# Patient Record
Sex: Male | Born: 1950 | Race: Black or African American | Hispanic: No | Marital: Single | Smoking: Current every day smoker
Health system: Southern US, Community
[De-identification: ages and names within clinical notes are randomized; demographics above are authoritative.]

## PROBLEM LIST (undated history)

## (undated) DIAGNOSIS — Z87442 Personal history of urinary calculi: Secondary | ICD-10-CM

## (undated) DIAGNOSIS — I10 Essential (primary) hypertension: Secondary | ICD-10-CM

## (undated) DIAGNOSIS — Z720 Tobacco use: Secondary | ICD-10-CM

## (undated) DIAGNOSIS — N189 Chronic kidney disease, unspecified: Secondary | ICD-10-CM

## (undated) DIAGNOSIS — I219 Acute myocardial infarction, unspecified: Secondary | ICD-10-CM

## (undated) DIAGNOSIS — R443 Hallucinations, unspecified: Secondary | ICD-10-CM

## (undated) DIAGNOSIS — D649 Anemia, unspecified: Secondary | ICD-10-CM

## (undated) DIAGNOSIS — K219 Gastro-esophageal reflux disease without esophagitis: Secondary | ICD-10-CM

## (undated) DIAGNOSIS — F32A Depression, unspecified: Secondary | ICD-10-CM

## (undated) DIAGNOSIS — F1010 Alcohol abuse, uncomplicated: Secondary | ICD-10-CM

## (undated) DIAGNOSIS — J309 Allergic rhinitis, unspecified: Secondary | ICD-10-CM

## (undated) DIAGNOSIS — E785 Hyperlipidemia, unspecified: Secondary | ICD-10-CM

## (undated) DIAGNOSIS — I4891 Unspecified atrial fibrillation: Secondary | ICD-10-CM

## (undated) DIAGNOSIS — B009 Herpesviral infection, unspecified: Secondary | ICD-10-CM

## (undated) DIAGNOSIS — F1410 Cocaine abuse, uncomplicated: Secondary | ICD-10-CM

## (undated) DIAGNOSIS — A6000 Herpesviral infection of urogenital system, unspecified: Secondary | ICD-10-CM

## (undated) DIAGNOSIS — M1990 Unspecified osteoarthritis, unspecified site: Secondary | ICD-10-CM

## (undated) DIAGNOSIS — I2510 Atherosclerotic heart disease of native coronary artery without angina pectoris: Secondary | ICD-10-CM

## (undated) HISTORY — DX: Cocaine abuse, uncomplicated: F14.10

## (undated) HISTORY — DX: Alcohol abuse, uncomplicated: F10.10

## (undated) HISTORY — DX: Hyperlipidemia, unspecified: E78.5

## (undated) HISTORY — DX: Herpesviral infection, unspecified: B00.9

## (undated) HISTORY — DX: Tobacco use: Z72.0

## (undated) HISTORY — DX: Unspecified atrial fibrillation: I48.91

## (undated) HISTORY — PX: CORONARY ARTERY BYPASS GRAFT: SHX141

## (undated) HISTORY — DX: Essential (primary) hypertension: I10

## (undated) HISTORY — DX: Anemia, unspecified: D64.9

## (undated) HISTORY — DX: Atherosclerotic heart disease of native coronary artery without angina pectoris: I25.10

## (undated) HISTORY — DX: Herpesviral infection of urogenital system, unspecified: A60.00

## (undated) HISTORY — DX: Allergic rhinitis, unspecified: J30.9

## (undated) HISTORY — DX: Gastro-esophageal reflux disease without esophagitis: K21.9

---

## 1988-03-11 HISTORY — PX: LACERATION REPAIR: SHX5168

## 1998-05-23 ENCOUNTER — Emergency Department (HOSPITAL_COMMUNITY): Admission: EM | Admit: 1998-05-23 | Discharge: 1998-05-23 | Payer: Self-pay | Admitting: Emergency Medicine

## 1998-09-14 ENCOUNTER — Emergency Department (HOSPITAL_COMMUNITY): Admission: EM | Admit: 1998-09-14 | Discharge: 1998-09-14 | Payer: Self-pay | Admitting: Emergency Medicine

## 1998-11-22 ENCOUNTER — Encounter: Payer: Self-pay | Admitting: Emergency Medicine

## 1998-11-22 ENCOUNTER — Emergency Department (HOSPITAL_COMMUNITY): Admission: EM | Admit: 1998-11-22 | Discharge: 1998-11-22 | Payer: Self-pay | Admitting: Emergency Medicine

## 1998-11-26 ENCOUNTER — Emergency Department (HOSPITAL_COMMUNITY): Admission: EM | Admit: 1998-11-26 | Discharge: 1998-11-26 | Payer: Self-pay | Admitting: Emergency Medicine

## 1998-11-26 ENCOUNTER — Encounter: Payer: Self-pay | Admitting: Emergency Medicine

## 1999-05-18 ENCOUNTER — Encounter: Admission: RE | Admit: 1999-05-18 | Discharge: 1999-05-18 | Payer: Self-pay | Admitting: Family Medicine

## 1999-06-14 ENCOUNTER — Encounter: Admission: RE | Admit: 1999-06-14 | Discharge: 1999-06-14 | Payer: Self-pay | Admitting: Family Medicine

## 1999-08-23 ENCOUNTER — Emergency Department (HOSPITAL_COMMUNITY): Admission: EM | Admit: 1999-08-23 | Discharge: 1999-08-23 | Payer: Self-pay | Admitting: Emergency Medicine

## 1999-08-23 ENCOUNTER — Encounter: Admission: RE | Admit: 1999-08-23 | Discharge: 1999-08-23 | Payer: Self-pay | Admitting: Sports Medicine

## 1999-09-22 ENCOUNTER — Encounter: Admission: RE | Admit: 1999-09-22 | Discharge: 1999-09-22 | Payer: Self-pay | Admitting: Family Medicine

## 1999-09-22 ENCOUNTER — Ambulatory Visit (HOSPITAL_COMMUNITY): Admission: RE | Admit: 1999-09-22 | Discharge: 1999-09-22 | Payer: Self-pay | Admitting: Family Medicine

## 1999-10-20 ENCOUNTER — Encounter: Admission: RE | Admit: 1999-10-20 | Discharge: 1999-10-20 | Payer: Self-pay | Admitting: Family Medicine

## 2000-01-24 ENCOUNTER — Encounter: Admission: RE | Admit: 2000-01-24 | Discharge: 2000-01-24 | Payer: Self-pay | Admitting: Family Medicine

## 2000-02-14 ENCOUNTER — Encounter: Admission: RE | Admit: 2000-02-14 | Discharge: 2000-02-14 | Payer: Self-pay | Admitting: Sports Medicine

## 2000-03-06 ENCOUNTER — Encounter: Admission: RE | Admit: 2000-03-06 | Discharge: 2000-03-06 | Payer: Self-pay | Admitting: Family Medicine

## 2000-03-21 ENCOUNTER — Encounter: Admission: RE | Admit: 2000-03-21 | Discharge: 2000-03-21 | Payer: Self-pay | Admitting: Family Medicine

## 2000-04-11 ENCOUNTER — Encounter: Admission: RE | Admit: 2000-04-11 | Discharge: 2000-04-11 | Payer: Self-pay | Admitting: Family Medicine

## 2000-05-17 ENCOUNTER — Encounter: Admission: RE | Admit: 2000-05-17 | Discharge: 2000-05-17 | Payer: Self-pay | Admitting: Family Medicine

## 2000-06-04 ENCOUNTER — Encounter: Admission: RE | Admit: 2000-06-04 | Discharge: 2000-06-04 | Payer: Self-pay | Admitting: Family Medicine

## 2000-06-12 ENCOUNTER — Encounter: Admission: RE | Admit: 2000-06-12 | Discharge: 2000-06-12 | Payer: Self-pay | Admitting: Family Medicine

## 2000-07-09 ENCOUNTER — Encounter: Admission: RE | Admit: 2000-07-09 | Discharge: 2000-07-09 | Payer: Self-pay | Admitting: Family Medicine

## 2000-07-20 ENCOUNTER — Encounter: Admission: RE | Admit: 2000-07-20 | Discharge: 2000-07-20 | Payer: Self-pay | Admitting: Family Medicine

## 2000-08-10 ENCOUNTER — Encounter: Admission: RE | Admit: 2000-08-10 | Discharge: 2000-08-10 | Payer: Self-pay | Admitting: Family Medicine

## 2000-08-13 ENCOUNTER — Encounter: Admission: RE | Admit: 2000-08-13 | Discharge: 2000-08-13 | Payer: Self-pay | Admitting: Family Medicine

## 2000-08-21 ENCOUNTER — Encounter: Admission: RE | Admit: 2000-08-21 | Discharge: 2000-08-21 | Payer: Self-pay | Admitting: Family Medicine

## 2000-10-12 ENCOUNTER — Encounter: Admission: RE | Admit: 2000-10-12 | Discharge: 2000-10-12 | Payer: Self-pay | Admitting: Family Medicine

## 2000-11-26 ENCOUNTER — Encounter: Admission: RE | Admit: 2000-11-26 | Discharge: 2000-11-26 | Payer: Self-pay | Admitting: Family Medicine

## 2001-02-27 ENCOUNTER — Encounter: Admission: RE | Admit: 2001-02-27 | Discharge: 2001-02-27 | Payer: Self-pay | Admitting: Family Medicine

## 2001-03-06 ENCOUNTER — Encounter: Admission: RE | Admit: 2001-03-06 | Discharge: 2001-03-06 | Payer: Self-pay | Admitting: Family Medicine

## 2001-04-15 ENCOUNTER — Encounter: Admission: RE | Admit: 2001-04-15 | Discharge: 2001-04-15 | Payer: Self-pay | Admitting: Sports Medicine

## 2001-08-21 ENCOUNTER — Encounter: Admission: RE | Admit: 2001-08-21 | Discharge: 2001-08-21 | Payer: Self-pay | Admitting: Family Medicine

## 2001-08-29 ENCOUNTER — Encounter: Admission: RE | Admit: 2001-08-29 | Discharge: 2001-08-29 | Payer: Self-pay | Admitting: Family Medicine

## 2001-09-18 ENCOUNTER — Encounter: Admission: RE | Admit: 2001-09-18 | Discharge: 2001-09-18 | Payer: Self-pay | Admitting: Family Medicine

## 2001-10-17 ENCOUNTER — Encounter: Admission: RE | Admit: 2001-10-17 | Discharge: 2001-10-17 | Payer: Self-pay | Admitting: Family Medicine

## 2001-12-16 ENCOUNTER — Encounter: Admission: RE | Admit: 2001-12-16 | Discharge: 2001-12-16 | Payer: Self-pay | Admitting: Family Medicine

## 2002-01-06 ENCOUNTER — Encounter: Admission: RE | Admit: 2002-01-06 | Discharge: 2002-01-06 | Payer: Self-pay | Admitting: Family Medicine

## 2002-01-16 ENCOUNTER — Encounter: Admission: RE | Admit: 2002-01-16 | Discharge: 2002-01-16 | Payer: Self-pay | Admitting: Sports Medicine

## 2002-03-20 ENCOUNTER — Encounter: Admission: RE | Admit: 2002-03-20 | Discharge: 2002-03-20 | Payer: Self-pay | Admitting: Family Medicine

## 2002-04-15 ENCOUNTER — Encounter: Admission: RE | Admit: 2002-04-15 | Discharge: 2002-04-15 | Payer: Self-pay | Admitting: Family Medicine

## 2002-04-22 ENCOUNTER — Encounter: Admission: RE | Admit: 2002-04-22 | Discharge: 2002-04-22 | Payer: Self-pay | Admitting: Family Medicine

## 2003-02-11 ENCOUNTER — Encounter: Admission: RE | Admit: 2003-02-11 | Discharge: 2003-02-11 | Payer: Self-pay | Admitting: Family Medicine

## 2003-09-12 DIAGNOSIS — I219 Acute myocardial infarction, unspecified: Secondary | ICD-10-CM

## 2003-09-12 HISTORY — DX: Acute myocardial infarction, unspecified: I21.9

## 2003-09-22 ENCOUNTER — Encounter: Admission: RE | Admit: 2003-09-22 | Discharge: 2003-09-22 | Payer: Self-pay | Admitting: Sports Medicine

## 2003-09-25 ENCOUNTER — Encounter: Admission: RE | Admit: 2003-09-25 | Discharge: 2003-09-25 | Payer: Self-pay | Admitting: Family Medicine

## 2003-10-20 ENCOUNTER — Encounter: Admission: RE | Admit: 2003-10-20 | Discharge: 2003-10-20 | Payer: Self-pay | Admitting: Family Medicine

## 2003-11-12 ENCOUNTER — Encounter: Admission: RE | Admit: 2003-11-12 | Discharge: 2003-11-12 | Payer: Self-pay | Admitting: Family Medicine

## 2003-11-19 ENCOUNTER — Encounter: Admission: RE | Admit: 2003-11-19 | Discharge: 2003-11-19 | Payer: Self-pay | Admitting: Family Medicine

## 2003-11-26 ENCOUNTER — Encounter: Admission: RE | Admit: 2003-11-26 | Discharge: 2003-11-26 | Payer: Self-pay | Admitting: Sports Medicine

## 2003-12-10 ENCOUNTER — Encounter: Admission: RE | Admit: 2003-12-10 | Discharge: 2003-12-10 | Payer: Self-pay | Admitting: Family Medicine

## 2004-01-18 ENCOUNTER — Encounter: Admission: RE | Admit: 2004-01-18 | Discharge: 2004-01-18 | Payer: Self-pay | Admitting: Family Medicine

## 2004-04-29 ENCOUNTER — Encounter: Admission: RE | Admit: 2004-04-29 | Discharge: 2004-04-29 | Payer: Self-pay | Admitting: Family Medicine

## 2004-05-23 ENCOUNTER — Ambulatory Visit: Payer: Self-pay | Admitting: Family Medicine

## 2004-07-19 ENCOUNTER — Ambulatory Visit: Payer: Self-pay | Admitting: Family Medicine

## 2004-07-20 ENCOUNTER — Encounter: Admission: RE | Admit: 2004-07-20 | Discharge: 2004-07-20 | Payer: Self-pay | Admitting: Sports Medicine

## 2004-08-11 HISTORY — PX: CARDIAC CATHETERIZATION: SHX172

## 2004-08-18 ENCOUNTER — Ambulatory Visit: Payer: Self-pay | Admitting: Family Medicine

## 2004-08-23 ENCOUNTER — Encounter: Admission: RE | Admit: 2004-08-23 | Discharge: 2004-09-01 | Payer: Self-pay | Admitting: Family Medicine

## 2004-08-29 ENCOUNTER — Inpatient Hospital Stay (HOSPITAL_COMMUNITY): Admission: EM | Admit: 2004-08-29 | Discharge: 2004-09-04 | Payer: Self-pay | Admitting: Emergency Medicine

## 2004-09-07 ENCOUNTER — Ambulatory Visit: Payer: Self-pay | Admitting: Sports Medicine

## 2004-11-14 ENCOUNTER — Ambulatory Visit: Payer: Self-pay | Admitting: Family Medicine

## 2004-11-14 ENCOUNTER — Ambulatory Visit (HOSPITAL_COMMUNITY): Admission: RE | Admit: 2004-11-14 | Discharge: 2004-11-14 | Payer: Self-pay | Admitting: Family Medicine

## 2004-11-17 ENCOUNTER — Ambulatory Visit: Payer: Self-pay | Admitting: Sports Medicine

## 2004-11-22 ENCOUNTER — Ambulatory Visit: Payer: Self-pay | Admitting: Family Medicine

## 2004-11-25 ENCOUNTER — Ambulatory Visit: Payer: Self-pay | Admitting: Family Medicine

## 2004-11-28 ENCOUNTER — Ambulatory Visit: Payer: Self-pay | Admitting: Family Medicine

## 2004-12-05 ENCOUNTER — Ambulatory Visit: Payer: Self-pay | Admitting: Family Medicine

## 2004-12-09 ENCOUNTER — Ambulatory Visit: Payer: Self-pay | Admitting: Sports Medicine

## 2004-12-16 ENCOUNTER — Ambulatory Visit: Payer: Self-pay | Admitting: Family Medicine

## 2004-12-27 ENCOUNTER — Ambulatory Visit: Payer: Self-pay | Admitting: Sports Medicine

## 2005-01-03 ENCOUNTER — Ambulatory Visit: Payer: Self-pay | Admitting: Family Medicine

## 2005-01-11 ENCOUNTER — Ambulatory Visit: Payer: Self-pay | Admitting: Family Medicine

## 2005-01-25 ENCOUNTER — Ambulatory Visit: Payer: Self-pay | Admitting: Family Medicine

## 2005-01-26 ENCOUNTER — Ambulatory Visit: Payer: Self-pay | Admitting: Family Medicine

## 2005-02-01 ENCOUNTER — Encounter: Admission: RE | Admit: 2005-02-01 | Discharge: 2005-02-01 | Payer: Self-pay | Admitting: Sports Medicine

## 2005-02-01 ENCOUNTER — Ambulatory Visit: Payer: Self-pay | Admitting: Family Medicine

## 2005-02-08 ENCOUNTER — Ambulatory Visit: Payer: Self-pay | Admitting: Family Medicine

## 2005-02-27 ENCOUNTER — Ambulatory Visit: Payer: Self-pay | Admitting: Family Medicine

## 2005-03-27 ENCOUNTER — Ambulatory Visit: Payer: Self-pay | Admitting: Family Medicine

## 2005-04-17 ENCOUNTER — Ambulatory Visit: Payer: Self-pay | Admitting: Family Medicine

## 2005-07-28 ENCOUNTER — Ambulatory Visit: Payer: Self-pay | Admitting: Family Medicine

## 2005-08-31 ENCOUNTER — Ambulatory Visit: Payer: Self-pay | Admitting: Family Medicine

## 2005-09-26 ENCOUNTER — Ambulatory Visit: Payer: Self-pay | Admitting: Family Medicine

## 2005-10-30 ENCOUNTER — Ambulatory Visit: Payer: Self-pay | Admitting: Family Medicine

## 2005-12-07 ENCOUNTER — Ambulatory Visit: Payer: Self-pay | Admitting: Family Medicine

## 2005-12-10 LAB — HM COLONOSCOPY: HM Colonoscopy: NORMAL

## 2005-12-28 ENCOUNTER — Encounter (INDEPENDENT_AMBULATORY_CARE_PROVIDER_SITE_OTHER): Payer: Self-pay | Admitting: Specialist

## 2005-12-28 ENCOUNTER — Ambulatory Visit: Payer: Self-pay | Admitting: Family Medicine

## 2005-12-28 ENCOUNTER — Ambulatory Visit (HOSPITAL_COMMUNITY): Admission: RE | Admit: 2005-12-28 | Discharge: 2005-12-28 | Payer: Self-pay | Admitting: Gastroenterology

## 2006-01-29 ENCOUNTER — Ambulatory Visit: Payer: Self-pay | Admitting: Family Medicine

## 2006-02-19 ENCOUNTER — Ambulatory Visit: Payer: Self-pay | Admitting: Family Medicine

## 2006-02-28 ENCOUNTER — Ambulatory Visit: Payer: Self-pay | Admitting: Family Medicine

## 2006-03-06 ENCOUNTER — Ambulatory Visit: Payer: Self-pay | Admitting: Family Medicine

## 2006-04-03 ENCOUNTER — Ambulatory Visit: Payer: Self-pay | Admitting: Sports Medicine

## 2006-05-22 ENCOUNTER — Ambulatory Visit: Payer: Self-pay | Admitting: Sports Medicine

## 2006-07-25 ENCOUNTER — Ambulatory Visit: Payer: Self-pay | Admitting: Sports Medicine

## 2006-08-14 ENCOUNTER — Ambulatory Visit: Payer: Self-pay | Admitting: Family Medicine

## 2006-08-27 ENCOUNTER — Encounter: Payer: Self-pay | Admitting: *Deleted

## 2006-10-25 ENCOUNTER — Ambulatory Visit: Payer: Self-pay | Admitting: Sports Medicine

## 2006-11-06 ENCOUNTER — Ambulatory Visit: Payer: Self-pay | Admitting: Family Medicine

## 2006-11-08 DIAGNOSIS — F339 Major depressive disorder, recurrent, unspecified: Secondary | ICD-10-CM | POA: Insufficient documentation

## 2006-11-08 DIAGNOSIS — F17200 Nicotine dependence, unspecified, uncomplicated: Secondary | ICD-10-CM

## 2006-11-08 DIAGNOSIS — F209 Schizophrenia, unspecified: Secondary | ICD-10-CM | POA: Insufficient documentation

## 2006-11-08 DIAGNOSIS — I4891 Unspecified atrial fibrillation: Secondary | ICD-10-CM

## 2006-11-08 DIAGNOSIS — J309 Allergic rhinitis, unspecified: Secondary | ICD-10-CM

## 2006-11-08 DIAGNOSIS — I2510 Atherosclerotic heart disease of native coronary artery without angina pectoris: Secondary | ICD-10-CM | POA: Insufficient documentation

## 2006-11-08 DIAGNOSIS — N400 Enlarged prostate without lower urinary tract symptoms: Secondary | ICD-10-CM | POA: Insufficient documentation

## 2006-11-08 DIAGNOSIS — K219 Gastro-esophageal reflux disease without esophagitis: Secondary | ICD-10-CM | POA: Insufficient documentation

## 2006-11-08 DIAGNOSIS — E7800 Pure hypercholesterolemia, unspecified: Secondary | ICD-10-CM | POA: Insufficient documentation

## 2006-11-08 DIAGNOSIS — N181 Chronic kidney disease, stage 1: Secondary | ICD-10-CM | POA: Insufficient documentation

## 2006-11-08 HISTORY — DX: Allergic rhinitis, unspecified: J30.9

## 2006-11-13 ENCOUNTER — Ambulatory Visit: Payer: Self-pay | Admitting: Family Medicine

## 2006-11-13 ENCOUNTER — Encounter (INDEPENDENT_AMBULATORY_CARE_PROVIDER_SITE_OTHER): Payer: Self-pay | Admitting: Family Medicine

## 2006-11-13 LAB — CONVERTED CEMR LAB
BUN: 14 mg/dL (ref 6–23)
CO2: 22 meq/L (ref 19–32)
Calcium: 9.2 mg/dL (ref 8.4–10.5)
Chloride: 103 meq/L (ref 96–112)
Creatinine, Ser: 1.19 mg/dL (ref 0.40–1.50)
Glucose, Bld: 73 mg/dL (ref 70–99)
Potassium: 3.9 meq/L (ref 3.5–5.3)
Sodium: 140 meq/L (ref 135–145)

## 2006-12-11 ENCOUNTER — Ambulatory Visit: Payer: Self-pay | Admitting: Sports Medicine

## 2006-12-11 DIAGNOSIS — I10 Essential (primary) hypertension: Secondary | ICD-10-CM

## 2007-02-19 ENCOUNTER — Encounter (INDEPENDENT_AMBULATORY_CARE_PROVIDER_SITE_OTHER): Payer: Self-pay | Admitting: Family Medicine

## 2007-02-19 ENCOUNTER — Ambulatory Visit: Payer: Self-pay | Admitting: Family Medicine

## 2007-02-19 LAB — CONVERTED CEMR LAB
ALT: 12 units/L (ref 0–53)
AST: 17 units/L (ref 0–37)
Albumin: 4.3 g/dL (ref 3.5–5.2)
Alkaline Phosphatase: 77 units/L (ref 39–117)
BUN: 13 mg/dL (ref 6–23)
Bilirubin Urine: NEGATIVE
CO2: 24 meq/L (ref 19–32)
Calcium: 9.4 mg/dL (ref 8.4–10.5)
Chloride: 106 meq/L (ref 96–112)
Creatinine, Ser: 1.12 mg/dL (ref 0.40–1.50)
Glucose, Bld: 67 mg/dL — ABNORMAL LOW (ref 70–99)
Glucose, Urine, Semiquant: NEGATIVE
HCT: 35.9 % — ABNORMAL LOW (ref 39.0–52.0)
Hemoglobin: 12 g/dL — ABNORMAL LOW (ref 13.0–17.0)
Ketones, urine, test strip: NEGATIVE
MCHC: 33.4 g/dL (ref 30.0–36.0)
MCV: 93.2 fL (ref 78.0–100.0)
Nitrite: NEGATIVE
PSA: 1.06 ng/mL (ref 0.10–4.00)
Platelets: 209 10*3/uL (ref 150–400)
Potassium: 4 meq/L (ref 3.5–5.3)
Protein, U semiquant: NEGATIVE
RBC: 3.85 M/uL — ABNORMAL LOW (ref 4.22–5.81)
RDW: 13.7 % (ref 11.5–14.0)
Sodium: 142 meq/L (ref 135–145)
Specific Gravity, Urine: 1.02
TSH: 2.385 microintl units/mL (ref 0.350–5.50)
Total Bilirubin: 1 mg/dL (ref 0.3–1.2)
Total Protein: 7.6 g/dL (ref 6.0–8.3)
Urobilinogen, UA: 0.2
WBC Urine, dipstick: NEGATIVE
WBC: 3.7 10*3/uL — ABNORMAL LOW (ref 4.0–10.5)
pH: 6

## 2007-02-25 ENCOUNTER — Ambulatory Visit (HOSPITAL_COMMUNITY): Admission: RE | Admit: 2007-02-25 | Discharge: 2007-02-25 | Payer: Self-pay | Admitting: Family Medicine

## 2007-04-23 ENCOUNTER — Encounter (INDEPENDENT_AMBULATORY_CARE_PROVIDER_SITE_OTHER): Payer: Self-pay | Admitting: Family Medicine

## 2007-05-22 ENCOUNTER — Ambulatory Visit: Payer: Self-pay | Admitting: Family Medicine

## 2007-05-22 LAB — CONVERTED CEMR LAB
Cholesterol, target level: 200 mg/dL
HDL goal, serum: 40 mg/dL
LDL Goal: 70 mg/dL

## 2007-05-28 ENCOUNTER — Encounter (INDEPENDENT_AMBULATORY_CARE_PROVIDER_SITE_OTHER): Payer: Self-pay | Admitting: Family Medicine

## 2007-06-05 ENCOUNTER — Ambulatory Visit: Payer: Self-pay | Admitting: Family Medicine

## 2007-06-05 ENCOUNTER — Encounter (INDEPENDENT_AMBULATORY_CARE_PROVIDER_SITE_OTHER): Payer: Self-pay | Admitting: Family Medicine

## 2007-06-05 LAB — CONVERTED CEMR LAB
Cholesterol: 134 mg/dL (ref 0–200)
HDL: 47 mg/dL (ref >39)
LDL Cholesterol: 70 mg/dL (ref 0–99)
Total CHOL/HDL Ratio: 2.9
Triglycerides: 84 mg/dL (ref <150)
VLDL: 17 mg/dL (ref 0–40)

## 2007-06-08 ENCOUNTER — Encounter (INDEPENDENT_AMBULATORY_CARE_PROVIDER_SITE_OTHER): Payer: Self-pay | Admitting: Family Medicine

## 2007-06-11 ENCOUNTER — Encounter (INDEPENDENT_AMBULATORY_CARE_PROVIDER_SITE_OTHER): Payer: Self-pay | Admitting: Family Medicine

## 2007-07-30 ENCOUNTER — Telehealth: Payer: Self-pay | Admitting: *Deleted

## 2007-08-13 ENCOUNTER — Ambulatory Visit: Payer: Self-pay | Admitting: Family Medicine

## 2007-08-15 ENCOUNTER — Encounter (INDEPENDENT_AMBULATORY_CARE_PROVIDER_SITE_OTHER): Payer: Self-pay | Admitting: Family Medicine

## 2007-10-15 ENCOUNTER — Telehealth: Payer: Self-pay | Admitting: *Deleted

## 2007-11-13 ENCOUNTER — Ambulatory Visit: Payer: Self-pay | Admitting: Family Medicine

## 2008-03-05 ENCOUNTER — Ambulatory Visit: Payer: Self-pay | Admitting: Family Medicine

## 2008-03-06 ENCOUNTER — Ambulatory Visit: Payer: Self-pay | Admitting: Family Medicine

## 2008-03-06 ENCOUNTER — Encounter: Payer: Self-pay | Admitting: Family Medicine

## 2008-03-06 LAB — CONVERTED CEMR LAB
Cholesterol: 171 mg/dL (ref 0–200)
HDL: 49 mg/dL (ref >39)
LDL Cholesterol: 102 mg/dL — ABNORMAL HIGH (ref 0–99)
Total CHOL/HDL Ratio: 3.5
Triglycerides: 98 mg/dL (ref <150)
VLDL: 20 mg/dL (ref 0–40)

## 2008-04-09 ENCOUNTER — Telehealth: Payer: Self-pay | Admitting: Family Medicine

## 2008-04-27 ENCOUNTER — Ambulatory Visit: Payer: Self-pay | Admitting: Family Medicine

## 2008-06-04 ENCOUNTER — Ambulatory Visit: Payer: Self-pay | Admitting: Family Medicine

## 2008-06-04 ENCOUNTER — Encounter: Payer: Self-pay | Admitting: Family Medicine

## 2008-06-16 ENCOUNTER — Telehealth (INDEPENDENT_AMBULATORY_CARE_PROVIDER_SITE_OTHER): Payer: Self-pay | Admitting: *Deleted

## 2008-07-17 ENCOUNTER — Telehealth: Payer: Self-pay | Admitting: *Deleted

## 2008-08-19 ENCOUNTER — Ambulatory Visit: Payer: Self-pay | Admitting: Family Medicine

## 2008-08-19 ENCOUNTER — Encounter: Payer: Self-pay | Admitting: Family Medicine

## 2008-08-19 LAB — CONVERTED CEMR LAB
ALT: 23 units/L (ref 0–53)
AST: 25 units/L (ref 0–37)
Albumin: 4.5 g/dL (ref 3.5–5.2)
Alkaline Phosphatase: 79 units/L (ref 39–117)
BUN: 14 mg/dL (ref 6–23)
CO2: 22 meq/L (ref 19–32)
Calcium: 9 mg/dL (ref 8.4–10.5)
Chloride: 110 meq/L (ref 96–112)
Creatinine, Ser: 1.45 mg/dL (ref 0.40–1.50)
Glucose, Bld: 88 mg/dL (ref 70–99)
PSA: 1.05 ng/mL (ref 0.10–4.00)
Potassium: 3.7 meq/L (ref 3.5–5.3)
Sodium: 145 meq/L (ref 135–145)
Total Bilirubin: 0.7 mg/dL (ref 0.3–1.2)
Total Protein: 7.6 g/dL (ref 6.0–8.3)

## 2008-08-24 ENCOUNTER — Encounter: Payer: Self-pay | Admitting: Family Medicine

## 2008-10-19 ENCOUNTER — Encounter: Payer: Self-pay | Admitting: Family Medicine

## 2008-11-16 ENCOUNTER — Ambulatory Visit: Payer: Self-pay | Admitting: Family Medicine

## 2008-11-16 ENCOUNTER — Encounter: Payer: Self-pay | Admitting: Family Medicine

## 2008-11-16 LAB — CONVERTED CEMR LAB
BUN: 10 mg/dL (ref 6–23)
CO2: 18 meq/L — ABNORMAL LOW (ref 19–32)
Calcium: 9 mg/dL (ref 8.4–10.5)
Chloride: 104 meq/L (ref 96–112)
Creatinine, Ser: 1.13 mg/dL (ref 0.40–1.50)
Glucose, Bld: 84 mg/dL (ref 70–99)
Potassium: 3.7 meq/L (ref 3.5–5.3)
Sodium: 142 meq/L (ref 135–145)

## 2008-11-26 ENCOUNTER — Encounter: Payer: Self-pay | Admitting: Family Medicine

## 2008-12-08 ENCOUNTER — Encounter: Payer: Self-pay | Admitting: *Deleted

## 2008-12-08 ENCOUNTER — Encounter: Payer: Self-pay | Admitting: Family Medicine

## 2008-12-14 ENCOUNTER — Telehealth: Payer: Self-pay | Admitting: Family Medicine

## 2009-02-11 ENCOUNTER — Telehealth: Payer: Self-pay | Admitting: Family Medicine

## 2009-02-25 ENCOUNTER — Ambulatory Visit: Payer: Self-pay | Admitting: Family Medicine

## 2009-03-09 ENCOUNTER — Ambulatory Visit: Payer: Self-pay | Admitting: Family Medicine

## 2009-03-11 ENCOUNTER — Encounter: Payer: Self-pay | Admitting: Family Medicine

## 2009-03-16 ENCOUNTER — Telehealth: Payer: Self-pay | Admitting: *Deleted

## 2009-03-25 ENCOUNTER — Telehealth: Payer: Self-pay | Admitting: Family Medicine

## 2009-04-13 ENCOUNTER — Telehealth: Payer: Self-pay | Admitting: Family Medicine

## 2009-04-14 ENCOUNTER — Ambulatory Visit: Payer: Self-pay | Admitting: Family Medicine

## 2009-04-14 DIAGNOSIS — M109 Gout, unspecified: Secondary | ICD-10-CM | POA: Insufficient documentation

## 2009-06-02 ENCOUNTER — Ambulatory Visit: Payer: Self-pay | Admitting: Family Medicine

## 2009-06-02 ENCOUNTER — Encounter: Payer: Self-pay | Admitting: Family Medicine

## 2009-06-03 ENCOUNTER — Telehealth: Payer: Self-pay | Admitting: Family Medicine

## 2009-06-07 ENCOUNTER — Ambulatory Visit: Payer: Self-pay | Admitting: Family Medicine

## 2009-06-09 ENCOUNTER — Ambulatory Visit: Payer: Self-pay | Admitting: Family Medicine

## 2009-08-16 ENCOUNTER — Ambulatory Visit: Payer: Self-pay | Admitting: Family Medicine

## 2009-11-25 ENCOUNTER — Telehealth: Payer: Self-pay | Admitting: Family Medicine

## 2009-12-02 ENCOUNTER — Encounter: Payer: Self-pay | Admitting: Family Medicine

## 2009-12-02 ENCOUNTER — Ambulatory Visit: Payer: Self-pay | Admitting: Family Medicine

## 2009-12-02 LAB — CONVERTED CEMR LAB
ALT: 13 units/L (ref 0–53)
AST: 21 units/L (ref 0–37)
Albumin: 4.2 g/dL (ref 3.5–5.2)
Alkaline Phosphatase: 87 units/L (ref 39–117)
BUN: 10 mg/dL (ref 6–23)
CO2: 23 meq/L (ref 19–32)
Calcium: 8.7 mg/dL (ref 8.4–10.5)
Chloride: 106 meq/L (ref 96–112)
Cholesterol: 125 mg/dL (ref 0–200)
Creatinine, Ser: 1.06 mg/dL (ref 0.40–1.50)
Glucose, Bld: 88 mg/dL (ref 70–99)
HDL: 40 mg/dL (ref >39)
LDL Cholesterol: 66 mg/dL (ref 0–99)
Potassium: 3.3 meq/L — ABNORMAL LOW (ref 3.5–5.3)
Sodium: 141 meq/L (ref 135–145)
Total Bilirubin: 0.6 mg/dL (ref 0.3–1.2)
Total CHOL/HDL Ratio: 3.1
Total Protein: 7.1 g/dL (ref 6.0–8.3)
Triglycerides: 97 mg/dL (ref <150)
VLDL: 19 mg/dL (ref 0–40)

## 2009-12-14 ENCOUNTER — Telehealth: Payer: Self-pay | Admitting: *Deleted

## 2010-02-16 ENCOUNTER — Encounter: Payer: Self-pay | Admitting: Family Medicine

## 2010-03-03 ENCOUNTER — Telehealth: Payer: Self-pay | Admitting: *Deleted

## 2010-03-17 ENCOUNTER — Ambulatory Visit: Payer: Self-pay | Admitting: Family Medicine

## 2010-03-17 DIAGNOSIS — M25519 Pain in unspecified shoulder: Secondary | ICD-10-CM | POA: Insufficient documentation

## 2010-03-23 ENCOUNTER — Ambulatory Visit: Payer: Self-pay | Admitting: Family Medicine

## 2010-04-04 ENCOUNTER — Ambulatory Visit: Payer: Self-pay | Admitting: Family Medicine

## 2010-04-06 ENCOUNTER — Encounter: Admission: RE | Admit: 2010-04-06 | Discharge: 2010-05-24 | Payer: Self-pay | Admitting: Family Medicine

## 2010-04-14 ENCOUNTER — Ambulatory Visit: Payer: Self-pay | Admitting: Cardiology

## 2010-04-18 ENCOUNTER — Ambulatory Visit: Payer: Self-pay | Admitting: Family Medicine

## 2010-05-23 ENCOUNTER — Telehealth: Payer: Self-pay | Admitting: Family Medicine

## 2010-05-24 ENCOUNTER — Ambulatory Visit: Payer: Self-pay | Admitting: Cardiovascular Disease

## 2010-05-24 ENCOUNTER — Encounter: Payer: Self-pay | Admitting: Family Medicine

## 2010-05-25 ENCOUNTER — Encounter: Payer: Self-pay | Admitting: Family Medicine

## 2010-05-25 LAB — CONVERTED CEMR LAB
Folate: 10.8 ng/mL
Hemoglobin: 9.2 g/dL
Iron: 37 ug/dL
Saturation Ratios: 9 %
Vitamin B-12: 232 pg/mL
WBC: 3.6 10*3/uL

## 2010-05-31 ENCOUNTER — Ambulatory Visit: Payer: Self-pay | Admitting: Family Medicine

## 2010-05-31 ENCOUNTER — Encounter: Payer: Self-pay | Admitting: Family Medicine

## 2010-05-31 DIAGNOSIS — D509 Iron deficiency anemia, unspecified: Secondary | ICD-10-CM | POA: Insufficient documentation

## 2010-05-31 LAB — CONVERTED CEMR LAB
Basophils Absolute: 0 10*3/uL (ref 0.0–0.1)
Basophils Relative: 0 % (ref 0–1)
Eosinophils Absolute: 0.1 10*3/uL (ref 0.0–0.7)
Eosinophils Relative: 3 % (ref 0–5)
Ferritin: 24 ng/mL (ref 22–322)
Folate: 7 ng/mL
HCT: 30.4 % — ABNORMAL LOW (ref 39.0–52.0)
Hemoglobin: 8.9 g/dL — ABNORMAL LOW (ref 13.0–17.0)
Iron: 19 ug/dL — ABNORMAL LOW (ref 42–165)
Lymphocytes Relative: 46 % (ref 12–46)
Lymphs Abs: 1.6 10*3/uL (ref 0.7–4.0)
MCHC: 29.3 g/dL — ABNORMAL LOW (ref 30.0–36.0)
MCV: 75.4 fL — ABNORMAL LOW (ref 78.0–100.0)
Monocytes Absolute: 0.2 10*3/uL (ref 0.1–1.0)
Monocytes Relative: 7 % (ref 3–12)
Neutro Abs: 1.5 10*3/uL — ABNORMAL LOW (ref 1.7–7.7)
Neutrophils Relative %: 43 % (ref 43–77)
PSA: 0.98 ng/mL (ref 0.10–4.00)
Platelets: 243 10*3/uL (ref 150–400)
RBC: 4.03 M/uL — ABNORMAL LOW (ref 4.22–5.81)
RDW: 19.3 % — ABNORMAL HIGH (ref 11.5–15.5)
Saturation Ratios: 5 % — ABNORMAL LOW (ref 20–55)
TIBC: 410 ug/dL (ref 215–435)
UIBC: 391 ug/dL
Vitamin B-12: 334 pg/mL (ref 211–911)
WBC: 3.3 10*3/uL — ABNORMAL LOW (ref 4.0–10.5)

## 2010-06-03 ENCOUNTER — Encounter: Payer: Self-pay | Admitting: *Deleted

## 2010-06-06 ENCOUNTER — Encounter: Payer: Self-pay | Admitting: *Deleted

## 2010-06-06 ENCOUNTER — Telehealth: Payer: Self-pay | Admitting: *Deleted

## 2010-06-07 ENCOUNTER — Ambulatory Visit: Payer: Self-pay | Admitting: Family Medicine

## 2010-06-07 ENCOUNTER — Encounter: Payer: Self-pay | Admitting: Family Medicine

## 2010-06-07 ENCOUNTER — Encounter: Payer: Self-pay | Admitting: *Deleted

## 2010-06-07 LAB — CONVERTED CEMR LAB
Basophils Absolute: 0 10*3/uL (ref 0.0–0.1)
Basophils Relative: 0 % (ref 0–1)
Eosinophils Absolute: 0.1 10*3/uL (ref 0.0–0.7)
Eosinophils Relative: 2 % (ref 0–5)
HCT: 31.7 % — ABNORMAL LOW (ref 39.0–52.0)
Hemoglobin: 9.8 g/dL — ABNORMAL LOW (ref 13.0–17.0)
Lymphocytes Relative: 38 % (ref 12–46)
Lymphs Abs: 1.6 10*3/uL (ref 0.7–4.0)
MCHC: 30.9 g/dL (ref 30.0–36.0)
MCV: 73.7 fL — ABNORMAL LOW (ref 78.0–100.0)
Monocytes Absolute: 0.3 10*3/uL (ref 0.1–1.0)
Monocytes Relative: 8 % (ref 3–12)
Neutro Abs: 2.1 10*3/uL (ref 1.7–7.7)
Neutrophils Relative %: 51 % (ref 43–77)
Platelets: 220 10*3/uL (ref 150–400)
RBC: 4.3 M/uL (ref 4.22–5.81)
RDW: 19.7 % — ABNORMAL HIGH (ref 11.5–15.5)
WBC: 4.1 10*3/uL (ref 4.0–10.5)

## 2010-06-15 ENCOUNTER — Encounter: Payer: Self-pay | Admitting: Family Medicine

## 2010-06-15 LAB — CONVERTED CEMR LAB
OCCULT 1: NEGATIVE
OCCULT 2: NEGATIVE
OCCULT 3: NEGATIVE

## 2010-06-20 ENCOUNTER — Encounter: Payer: Self-pay | Admitting: Family Medicine

## 2010-06-29 ENCOUNTER — Encounter: Payer: Self-pay | Admitting: Family Medicine

## 2010-07-06 ENCOUNTER — Ambulatory Visit: Payer: Self-pay | Admitting: Family Medicine

## 2010-07-07 ENCOUNTER — Ambulatory Visit: Payer: Self-pay | Admitting: Cardiovascular Disease

## 2010-07-11 ENCOUNTER — Encounter: Payer: Self-pay | Admitting: *Deleted

## 2010-07-11 ENCOUNTER — Ambulatory Visit: Payer: Self-pay | Admitting: Family Medicine

## 2010-07-11 ENCOUNTER — Encounter: Payer: Self-pay | Admitting: Family Medicine

## 2010-07-11 LAB — CONVERTED CEMR LAB
HCT: 35.9 % — ABNORMAL LOW (ref 39.0–52.0)
Hemoglobin: 11.2 g/dL — ABNORMAL LOW (ref 13.0–17.0)
MCHC: 31.2 g/dL (ref 30.0–36.0)
MCV: 84.1 fL (ref 78.0–100.0)
Platelets: 202 10*3/uL (ref 150–400)
RBC: 4.27 M/uL (ref 4.22–5.81)
RDW: 25.9 % — ABNORMAL HIGH (ref 11.5–15.5)
WBC: 4.1 10*3/uL (ref 4.0–10.5)

## 2010-07-18 ENCOUNTER — Ambulatory Visit: Payer: Self-pay | Admitting: Family Medicine

## 2010-07-24 ENCOUNTER — Telehealth: Payer: Self-pay | Admitting: Family Medicine

## 2010-07-24 ENCOUNTER — Encounter: Payer: Self-pay | Admitting: Family Medicine

## 2010-07-25 ENCOUNTER — Ambulatory Visit: Payer: Self-pay | Admitting: Cardiovascular Disease

## 2010-07-28 ENCOUNTER — Ambulatory Visit: Payer: Self-pay | Admitting: Family Medicine

## 2010-08-22 ENCOUNTER — Ambulatory Visit: Payer: Self-pay | Admitting: Cardiology

## 2010-08-27 NOTE — Progress Notes (Signed)
Subjective:     Patient ID: Jesse Franklin is a 59 y.o. male.  HPI  Review of Systems     Objective:   Physical Exam    Erroneous encounter during abstraction 08/27/2010

## 2010-08-28 NOTE — Progress Notes (Signed)
This encounter was entered in error during abstraction.  The encounter was opened as an office visit instead of an abstraction. The encounter was corrected with an Erroneous Encounter SmartSet.

## 2010-08-29 ENCOUNTER — Encounter: Payer: Self-pay | Admitting: Family Medicine

## 2010-08-29 ENCOUNTER — Ambulatory Visit: Payer: Self-pay | Admitting: Family Medicine

## 2010-09-21 ENCOUNTER — Ambulatory Visit: Payer: Self-pay | Admitting: Cardiovascular Disease

## 2010-09-27 ENCOUNTER — Ambulatory Visit: Admission: RE | Admit: 2010-09-27 | Discharge: 2010-09-27 | Payer: Self-pay | Source: Home / Self Care

## 2010-10-13 NOTE — Miscellaneous (Signed)
  Clinical Lists Changes  Medications: Changed medication from METOPROLOL SUCCINATE 25 MG XR24H-TAB (METOPROLOL SUCCINATE) 1 tab by mouth daily to METOPROLOL SUCCINATE 25 MG XR24H-TAB (METOPROLOL SUCCINATE) 2 tab by mouth daily Orders: Added new Test order of [facility]- Est Level  3 (72257) - Signed

## 2010-10-13 NOTE — Assessment & Plan Note (Signed)
Summary: BP CHECK/KH  Nurse Visit   Vital Signs:  Patient profile:   60 year old male BP sitting:   168 / 95  (right arm) Cuff size:   regular  Vitals Entered By: Enid Skeens, CMA (July 11, 2010 9:53 AM) CC: BP RECHECK    Allergies: 1)  * Toradol 2)  * Toradol  Orders Added: 1)  No Charge Patient Arrived (NCPA0) [NCPA0]

## 2010-10-13 NOTE — Progress Notes (Signed)
Summary: meds prob  Phone Note Refill Request Call back at Home Phone [phone] Message from:  Patient  Refills Requested: Medication #1:  SIMVASTATIN 80 MG TABS Take 1 tablet by mouth once a day pt has been out for a week  Initial call taken by: Audie Clear,  May 23, 2010 8:35 AM  Follow-up for Phone Call        to pcp Follow-up by: Elige Radon RN,  May 23, 2010 8:49 AM  Additional Follow-up for Phone Call Additional follow up Details #1::       Additional Follow-up by: Elige Radon RN,  May 24, 2010 10:02 AM    Additional Follow-up for Phone Call Additional follow up Details #2::    Pt calling regarding rx.  Still waiting for refill to be faxed to pharmacy.  Have an appt on Tues the 20th with primary, but do not want to wait until then to get meds.  Have been out since 2 wks ago.   Follow-up by: Eusebio Friendly  Additional Follow-up for Phone Call Additional follow up Details #3:: Details for Additional Follow-up Action Taken: I explained the md has to change the med or reduce dose. she may do this before or at his appt. told him I will call him when I hear from md   according to the package insert for simvastatin, it can be continued at 66m if pt has not had any adverse reaction.  Pt is not taking any medications that are contraindicated.  Pt has been using since 2007. Will refill simvastatin as same dose.  Will call pt to notify that Rx in pharmacy. DDorthey SawyerMD  May 27, 2010 7:56 AM  Additional Follow-up by: SElige RadonRN,  May 24, 2010 10:03 AM  Prescriptions: SIMVASTATIN 80 MG TABS (SIMVASTATIN) Take 1 tablet by mouth once a day  #30 x 3   Entered and Authorized by:   DDorthey SawyerMD   Signed by:   DDorthey SawyerMD on 05/27/2010   Method used:   [phone]       [address]       [place]  [address]      Ph: [phone]  [phone]      Fax: [phone]  RxID:   [id number]

## 2010-10-13 NOTE — Assessment & Plan Note (Signed)
Summary: anemia/ bp/ smoking cessation   Vital Signs:  Patient profile:   60 year old male Height:      66.5 inches Weight:      165 pounds BMI:     26.33 Temp:     98.2 degrees F oral Pulse rate:   66 / minute BP sitting:   125 / 82  (left arm) Cuff size:   regular  Vitals Entered By: Schuyler Amor CMA (May 31, 2010 2:54 PM) CC: F/U Is Patient Diabetic? No Pain Assessment Patient in pain? no        Primary Care Provider:  Dorthey Sawyer MD  CC:  F/U.  History of Present Illness:  Anemia: At pt's cardiology visit, lab tech thought that pt's blood looked "anemic" she told the MD who ordered a CBC and found his hemoglobin to be 9.5.  Pt was referred to me for further workup.  No source of bleeding identified.  No blood in stool, No tar colored stools.  No vomiting or coughing up blood.  Occasional reflux symptoms 1-2 x week.  On nexium.  does take motrin occasionally for h/a or aches and pains but not daily.    No renal problems.  No hx of blood transfusion.  No hx of liver problems.  Past hx of ETOH abuse but pt states that he is doing much better and now only drinks approx 4 glasses of wine per week.    HTN: BP wnl today and at cardiology visit.  No med changes at last cardiology visit.  prevention health care: Pt agreed to PSA screening today.  Spent time talking with pt about smoking cessation.   Pt smokes marijuna once daily. And smokes 4-6 cigarettes daily.     Habits & Providers  Alcohol-Tobacco-Diet     Tobacco Status: current     Tobacco Counseling: to quit use of tobacco products     Cigarette Packs/Day: <0.25  Current Medications (verified): 1)  Altace 10 Mg Caps (Ramipril) .Marland Kitchen.. 1 Tablet Twice A Day  For Blood Pressure 2)  Bayer Childrens Aspirin 81 Mg Chew (Aspirin) .... Take 1 Tablet By Mouth Once A Day 3)  Coumadin 5 Mg Tabs (Warfarin Sodium) .... Take 1 Tablet By Mouth Once A Day 4)  Flonase 50 Mcg/act Susp (Fluticasone Propionate) .... Spray 2  Spray Into Both Nostrils Once A Day 5)  Furosemide 20 Mg Tabs (Furosemide) .... Take 1 Tablet By Mouth Every Morning 6)  Imdur 60 Mg Tb24 (Isosorbide Mononitrate) .... Take 1 Tablet By Mouth Once A Day 7)  Metoprolol Succinate 25 Mg Xr24h-Tab (Metoprolol Succinate) .Marland Kitchen.. 1 Tab By Mouth Daily 8)  Nitroglycerin 0.4 Mg Subl (Nitroglycerin) .... Place 1 Tablet Under Tongue As Directed 9)  Plavix 75 Mg Tabs (Clopidogrel Bisulfate) .... Take 1 Tablet By Mouth Once A Day 10)  Simvastatin 80 Mg Tabs (Simvastatin) .... Take 1 Tablet By Mouth Once A Day 11)  Colcrys 0.6 Mg Tabs (Colchicine) .Marland Kitchen.. 1 Tab Twice A Day For Gout 12)  Qvar 80 Mcg/act Aers (Beclomethasone Dipropionate) .... Inhale 1 Puff As Directed Twice A Day 13)  Nexium 40 Mg Cpdr (Esomeprazole Magnesium) .Marland Kitchen.. 1 Tab By Mouth Daily 14)  Voltaren 1 % Gel (Diclofenac Sodium) .... Apply To Affected Area 3 Times A Day For 5 Days 60 G Tube 15)  Ibuprofen 600 Mg Tabs (Ibuprofen) .Marland Kitchen.. 1 Tab Twice A Day Only As Needed For Pain  Allergies (verified): 1)  * Toradol 2)  * Toradol  Family History: brother died 4/06 of unknown causes, h/o EtOH (father, brother), ?cirrhosis, sister died of AMI   sister (vaginal cancer)    Social History: Lives alone.  Unemployed.  Rare social smoker.  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 drinks wine (2-4x per week).  No current recreational drug use, although h/o cocaine. Still marijuana 1 x day.    Unable to read.    Smoking 3-4 cigarettes per day.   Review of Systems       as per hpi  Physical Exam  General:  VSS Well-developed,well-nourished,in no acute distress; alert,appropriate and cooperative throughout examination Eyes:  No conjunctival pallor Lungs:  Normal respiratory effort, chest expands symmetrically. Lungs are clear to auscultation, no crackles or wheezes. Heart:  Normal rate and regular rhythm. S1 and S2 normal without gallop, murmur, click, rub or other extra sounds. Extremities:  no edema, no palm  pallor Psych:  Cognition and judgment appear intact. Alert and cooperative with normal attention span and concentration. No apparent delusions, illusions, hallucinations   Impression & Recommendations:  Problem # 1:  UNSPECIFIED DEFICIENCY ANEMIA (ICD-281.9) No source of bleeding identified.  Will obtain a CBC, B12, folate, iron, TIBC, ferritin.  Pt to return within 1 month to review the results.  Pt instructed to stop taking motrin and to take tylenol if needed.  Pt is up to date on colonscopy.  Flowsheet states that this was in 2007 and was WNL.  I will request full report of this screening.  Orders: CBC w/Diff-[facility] ([id number]) B12-[facility] [phone]) Folate-[facility] [phone]) Ferritin-[facility] [phone]) Iron Binding Cap (TIBC)-[facility] ([id number]) Iron -[facility] [phone]) [place]- Est  Level 4 (53664)  Problem # 2:  SPECIAL SCREENING MALIGNANT NEOPLASM OF PROSTATE (ICD-V76.44) Pt due for PSA screening.  PSA obtained will plan on doing rectal exam at next visit since this was not the main reason for pt visit today.    Orders: PSA-[facility] ([id number]) [place]- Est  Level 4 (63875)  Problem # 3:  TOBACCO DEPENDENCE (ICD-305.1)  Pt encourged to stop smoking.  Gave handout on smoking cessation classes.   Orders: [place]- Est  Level 4 (64332)  Problem # 4:  Preventive Health Care (ICD-V70.0) Pt to return in 1 month for f/up for anemia and smoking cessation counseling.  Pt encouraged to stop smoking marijuna.   Will  consider referral to nutrition for counseling on low sodium, heart healthy diet,  Pt did not ask if he is a canidate for cardiac rehab at last cardiology visit.  Can consider checking into this in the future.  Will need to review the rest of the [facility] form with pt at a future visit.   Complete Medication List: 1)  Altace 10 Mg Caps (Ramipril) .Marland Kitchen.. 1 tablet twice a day  for blood pressure 2)  Bayer Childrens Aspirin 81 Mg Chew (Aspirin) .... Take 1 tablet by mouth once a day 3)  Coumadin 5 Mg Tabs  (Warfarin sodium) .... Take 1 tablet by mouth once a day 4)  Flonase 50 Mcg/act Susp (Fluticasone propionate) .... Spray 2 spray into both nostrils once a day 5)  Furosemide 20 Mg Tabs (Furosemide) .... Take 1 tablet by mouth every morning 6)  Imdur 60 Mg Tb24 (Isosorbide mononitrate) .... Take 1 tablet by mouth once a day 7)  Metoprolol Succinate 25 Mg Xr24h-tab (Metoprolol succinate) .Marland Kitchen.. 1 tab by mouth daily 8)  Nitroglycerin 0.4 Mg Subl (Nitroglycerin) .... Place 1 tablet under tongue as directed 9)  Plavix 75 Mg Tabs (Clopidogrel bisulfate) .Marland KitchenMarland KitchenMarland Kitchen  Take 1 tablet by mouth once a day 10)  Simvastatin 80 Mg Tabs (Simvastatin) .... Take 1 tablet by mouth once a day 11)  Colcrys 0.6 Mg Tabs (Colchicine) .Marland Kitchen.. 1 tab twice a day for gout 12)  Qvar 80 Mcg/act Aers (Beclomethasone dipropionate) .... Inhale 1 puff as directed twice a day 13)  Nexium 40 Mg Cpdr (Esomeprazole magnesium) .Marland Kitchen.. 1 tab by mouth daily 14)  Voltaren 1 % Gel (Diclofenac sodium) .... Apply to affected area 3 times a day for 5 days 60 g tube 15)  Ibuprofen 600 Mg Tabs (Ibuprofen) .Marland Kitchen.. 1 tab twice a day only as needed for pain  Patient Instructions: 1)  Return in 1 month to review results to follow up on anemia and  smoking cessation.  2)  Will obtain blood work to look for cause of anemia today.  We will discuss results at next appt. 3)  Stop taking motrin.  Take tylenol as needed. 4)  Stop smoking and using marijuana. 5)  Consider going to the smoking cessation classes.   Prevention & Chronic Care Immunizations   Influenza vaccine: refused  (06/04/2008)   Influenza vaccine deferral: Not available  (04/04/2010)   Influenza vaccine due: 06/10/2010    Tetanus booster: 06/04/2008: given   Tetanus booster due: 06/04/2018    Pneumococcal vaccine: Not documented  Colorectal Screening   Hemoccult: Done.  (08/16/2005)   Hemoccult action/deferral: Not indicated  (04/04/2010)   Hemoccult due: 08/16/2006    Colonoscopy: normal   (12/10/2005)   Colonoscopy due: 12/11/2015  Other Screening   PSA: 1.05  (08/19/2008)   PSA ordered.   PSA due due: 06/10/2010   Smoking status: current  (05/31/2010)   Smoking cessation counseling: yes  (08/19/2008)  Lipids   Total Cholesterol: 125  (12/02/2009)   LDL: 66  (12/02/2009)   LDL Direct: Not documented   HDL: 40  (12/02/2009)   Triglycerides: 97  (12/02/2009)   Lipid panel due: 09/01/2009    SGOT (AST): 21  (12/02/2009)   SGPT (ALT): 13  (12/02/2009)   Alkaline phosphatase: 87  (12/02/2009)   Total bilirubin: 0.6  (12/02/2009)  Hypertension   Last Blood Pressure: 125 / 82  (05/31/2010)   Serum creatinine: 1.06  (12/02/2009)   Serum potassium 3.3  (12/02/2009)  Self-Management Support :   Personal Goals (by the next clinic visit) :      Personal blood pressure goal: 140/90  (06/02/2009)     Personal LDL goal: 100  (06/02/2009)    Hypertension self-management support: Not documented    Lipid self-management support: Not documented

## 2010-10-13 NOTE — Consult Note (Signed)
Summary: [facility]   Imported By: Raymond Gurney 05/31/2010 11:41:28  _____________________________________________________________________  External Attachment:    Type:   Image     Comment:   External Document

## 2010-10-13 NOTE — Assessment & Plan Note (Signed)
Summary: bp/smoking/myalgias   Vital Signs:  Patient profile:   60 year old male Weight:      169.3 pounds Pulse rate:   58 / minute BP sitting:   144 / 84  (right arm) Cuff size:   regular  Vitals Entered By: Mauricia Area CMA, (July 28, 2010 2:02 PM) CC: f/up HTN Is Patient Diabetic? No Pain Assessment Patient in pain? no        Primary Care Provider:  Dorthey Sawyer MD  CC:  f/up HTN.  History of Present Illness: pt here for bp recheck: pt only taking metoprolol 25 mg daily. (had in previous visit prescribed 28m daily).  States he is taking medications as directed but he sometimes has a hard time remembering.  He states that he tries to spread out his tablets and take a few at a time over the morning time b/c he doesn't want to take them all at the same time.  Reassured pt that the mediations that we are giving him are safe to take together but he is still concerned that it is unsafe.  Because he spreads out his am meds throughout the morning time he sometimes forgets to takes some of  them.  we discussed the danger of not taking his medications accurately and skipping dosages.   simvastatin: pt states he takes as directed.  he also states that he has noticed upper leg aches since he started taking simvastatin.   Habits & Providers  Alcohol-Tobacco-Diet     Tobacco Status: current     Tobacco Counseling: to quit use of tobacco products  Current Medications (verified): 1)  Altace 10 Mg Caps (Ramipril) ..Marland Kitchen. 1 Tablet Twice A Day  For Blood Pressure 2)  Bayer Childrens Aspirin 81 Mg Chew (Aspirin) .... Take 1 Tablet By Mouth Once A Day 3)  Coumadin 5 Mg Tabs (Warfarin Sodium) .... Take 1 Tablet By Mouth Once A Day 4)  Flonase 50 Mcg/act Susp (Fluticasone Propionate) .... Spray 2 Spray Into Both Nostrils Once A Day 5)  Furosemide 20 Mg Tabs (Furosemide) .... Take 1 Tablet By Mouth Every Morning 6)  Imdur 60 Mg Tb24 (Isosorbide Mononitrate) .... Take 1 Tablet By Mouth Once  A Day 7)  Metoprolol Succinate 25 Mg Xr24h-Tab (Metoprolol Succinate) ..Marland Kitchen. 1 Tab By Mouth Daily 8)  Nitroglycerin 0.4 Mg Subl (Nitroglycerin) .... Place 1 Tablet Under Tongue As Directed 9)  Plavix 75 Mg Tabs (Clopidogrel Bisulfate) .... Take 1 Tablet By Mouth Once A Day 10)  Colcrys 0.6 Mg Tabs (Colchicine) ..Marland Kitchen. 1 Tab Twice A Day For Gout 11)  Qvar 80 Mcg/act Aers (Beclomethasone Dipropionate) .... Inhale 1 Puff As Directed Twice A Day 12)  Nexium 40 Mg Cpdr (Esomeprazole Magnesium) ..Marland Kitchen. 1 Tab By Mouth Daily 13)  Voltaren 1 % Gel (Diclofenac Sodium) .... Apply To Affected Area 3 Times A Day For 5 Days 60 G Tube 14)  Ferrous Sulfate 325 (65 Fe) Mg Tabs (Ferrous Sulfate) .... Take Two Times A Day Between Meals 15)  Norvasc 5 Mg Tabs (Amlodipine Besylate) .... Take 1 Tablet Daily 16)  Pravachol 40 Mg Tabs (Pravastatin Sodium) .... Take 1 Daily  Allergies (verified): 1)  * Toradol 2)  * Toradol  Review of Systems       no h/a.  some muscle aches in upper legs, no weight changes.  no cp, no sob, no skin rashes.  Physical Exam  General:  Well-developed,well-nourished,in no acute distress; alert,appropriate and cooperative throughout examination Lungs:  Normal  respiratory effort, chest expands symmetrically. Lungs are clear to auscultation, no crackles or wheezes. Heart:  Normal rate and regular rhythm. S1 and S2 normal without gallop, murmur, click, rub or other extra sounds. Msk:  normal gait, moving all 4 ext. Extremities:  no edema   Impression & Recommendations:  Problem # 1:  HYPERTENSION, BENIGN ESSENTIAL (ICD-401.1)  focus of todays visit was on making sure pt is taking meds correctly.  went over med list in detail.  labeled tablet bottles with indications to help lable more clearly.  discussed different tactics that can be used to be more consistent in taking meds.  Pt is to try to take AM medications all at once instead of taking  a few at a time throughout the morning.  We will  recheck bp in 1 month and if it remains elevated will consider increaseing dosage.  I do not want to increase or add mediation again until I am sure that pt is compliant with current regimen.  His updated medication list for this problem includes:    Altace 10 Mg Caps (Ramipril) .Marland Kitchen... 1 tablet twice a day  for blood pressure    Furosemide 20 Mg Tabs (Furosemide) .Marland Kitchen... Take 1 tablet by mouth every morning    Metoprolol Succinate 25 Mg Xr24h-tab (Metoprolol succinate) .Marland Kitchen... 1 tab by mouth daily    Norvasc 5 Mg Tabs (Amlodipine besylate) .Marland Kitchen... Take 1 tablet daily  Orders: [place]- Est  Level 4 (26378)  Problem # 2:  MYALGIA (ICD-729.1)  changes simvastatin to prvastatin today since side effect profile better with pravastatin.  pt agrees to this change.  pt to follow up in 1 month and we will see if his muscle aches are improved.   precepted pt with Dr. Gunnar Bulla.  The following medications were removed from the medication list:    Ibuprofen 600 Mg Tabs (Ibuprofen) .Marland Kitchen... 1 tab twice a day only as needed for pain His updated medication list for this problem includes:    Bayer Childrens Aspirin 81 Mg Chew (Aspirin) .Marland Kitchen... Take 1 tablet by mouth once a day  Orders: [place]- Est  Level 4 (58850)  Problem # 3:  TOBACCO DEPENDENCE (ICD-305.1) pt continues to smoke.  encouraged smoking cessation.  Orders: [place]- Est  Level 4 (99214)  Complete Medication List: 1)  Altace 10 Mg Caps (Ramipril) .Marland Kitchen.. 1 tablet twice a day  for blood pressure 2)  Bayer Childrens Aspirin 81 Mg Chew (Aspirin) .... Take 1 tablet by mouth once a day 3)  Coumadin 5 Mg Tabs (Warfarin sodium) .... Take 1 tablet by mouth once a day 4)  Flonase 50 Mcg/act Susp (Fluticasone propionate) .... Spray 2 spray into both nostrils once a day 5)  Furosemide 20 Mg Tabs (Furosemide) .... Take 1 tablet by mouth every morning 6)  Imdur 60 Mg Tb24 (Isosorbide mononitrate) .... Take 1 tablet by mouth once a day 7)  Metoprolol Succinate 25 Mg  Xr24h-tab (Metoprolol succinate) .Marland Kitchen.. 1 tab by mouth daily 8)  Nitroglycerin 0.4 Mg Subl (Nitroglycerin) .... Place 1 tablet under tongue as directed 9)  Plavix 75 Mg Tabs (Clopidogrel bisulfate) .... Take 1 tablet by mouth once a day 10)  Colcrys 0.6 Mg Tabs (Colchicine) .Marland Kitchen.. 1 tab twice a day for gout 11)  Qvar 80 Mcg/act Aers (Beclomethasone dipropionate) .... Inhale 1 puff as directed twice a day 12)  Nexium 40 Mg Cpdr (Esomeprazole magnesium) .Marland Kitchen.. 1 tab by mouth daily 13)  Voltaren 1 % Gel (Diclofenac sodium) .Marland KitchenMarland KitchenMarland Kitchen  Apply to affected area 3 times a day for 5 days 60 g tube 14)  Ferrous Sulfate 325 (65 Fe) Mg Tabs (Ferrous sulfate) .... Take two times a day between meals 15)  Norvasc 5 Mg Tabs (Amlodipine besylate) .... Take 1 tablet daily 16)  Pravachol 40 Mg Tabs (Pravastatin sodium) .... Take 1 daily  Patient Instructions: 1)  return in 1 month for bp follow up Prescriptions: PRAVACHOL 40 MG TABS (PRAVASTATIN SODIUM) take 1 daily  #30 x 3   Entered and Authorized by:   Dorthey Sawyer MD   Signed by:   Dorthey Sawyer MD on 07/28/2010   Method used:   [facility]        [facility] [address] [phone]* (retail)       [address]       [place], [place]  [address]       Ph: [phone] or [phone]       Fax: [phone]   RxID:   [id number]    Orders Added: 1)  [facility]- Est  Level 4 [51884]

## 2010-10-13 NOTE — Assessment & Plan Note (Signed)
Summary: bp check /eo  Nurse Visit patient in for BP check today. BP checked manually using  regular adult cuff. BP LA 150/84, RA 150/80, puolse 62. patient ran out of Ramipril and thought he had no refills on this.  this AM  was the only time he did not take , as he  took last capsule last night. RN call pharmacy and verified that he does have a RX there and it is ready for him to pick up. Wrote down last 4 BP reading in this office for him to take to cardiologist as directed by MD. [name] RN  April 18, 2010 9:39 AM   Allergies: 1)  * Toradol 2)  * Toradol  Orders Added: 1)  No Charge Patient Arrived (NCPA0) [NCPA0]

## 2010-10-13 NOTE — Miscellaneous (Signed)
Summary: gave NPI to [facility]  Clinical Lists Changes gave our NPI to them for 2 visits. having 2 tests ( [phone]).Elige Radon RN  June 07, 2010 11:00 AM

## 2010-10-13 NOTE — Miscellaneous (Signed)
Summary: PATIENT SUMMARY  Clinical Lists Changes  Unable to read - sometimes brings in correspondence from insurance company as he does not know what it is asking for.  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  Also sees [facility] (Dr. Acie Fredrickson).  Stable His updated medication list for this problem includes:    Simvastatin 80 Mg Tabs (Simvastatin) .Marland Kitchen... Take 1 tablet by mouth once a day  Assessed ATRIAL FIBRILLATION as comment only - On coumadin.  Has INRs monitored at [facility].  Often calls [place] to ask [place] to refill his coumadin.  Remind him we do not check his INRs, [facility] does so they should refill his coumadin.  Stable on troprol.  His updated medication list for this problem includes:    Bayer Childrens Aspirin 81 Mg Chew (Aspirin) .Marland Kitchen... Take 1 tablet by mouth once a day    Coumadin 5 Mg Tabs (Warfarin sodium) .Marland Kitchen... Take 1 tablet by mouth once a day    Metoprolol Succinate 25 Mg Xr24h-tab (Metoprolol succinate) .Marland Kitchen... 1 tab by mouth daily    Plavix 75 Mg Tabs (Clopidogrel bisulfate) .Marland Kitchen... Take 1 tablet by mouth once a day  Assessed CORONARY, ARTERIOSCLEROSIS as comment only - Non-STEMI in 12/05.  ON aspirin/plavix since then for maximum medical mangaement.  Followed by [facility].  His updated medication list for this problem includes:    Altace 10 Mg Caps (Ramipril) .Marland Kitchen... 1 tablet twice a day  for blood pressure    Bayer Childrens Aspirin 81 Mg Chew (Aspirin) .Marland Kitchen... Take 1 tablet by mouth once a day    Furosemide 20 Mg Tabs (Furosemide) .Marland Kitchen... Take 1 tablet by mouth every morning    Imdur 60 Mg Tb24 (Isosorbide mononitrate) .Marland Kitchen... Take 1 tablet by mouth once a day    Metoprolol Succinate 25 Mg Xr24h-tab (Metoprolol succinate) .Marland Kitchen... 1 tab by mouth  daily    Nitroglycerin 0.4 Mg Subl (Nitroglycerin) .Marland Kitchen... Place 1 tablet under tongue as directed    Plavix 75 Mg Tabs (Clopidogrel bisulfate) .Marland Kitchen... Take 1 tablet by mouth once a day  Assessed HYPERTENSION, BENIGN ESSENTIAL as comment only - Stable on current regimen.  As mentioned, also followed by cardiology who occassionally adjust his regimen.  His updated medication list for this problem includes:    Altace 10 Mg Caps (Ramipril) .Marland Kitchen... 1 tablet twice a day  for blood pressure    Furosemide 20 Mg Tabs (Furosemide) .Marland Kitchen... Take 1 tablet by mouth every morning    Metoprolol Succinate 25 Mg Xr24h-tab (Metoprolol succinate) .Marland Kitchen... 1 tab by mouth daily  Assessed GOUT as comment only - Has frequent flares, especially if he goes off of his colchicine.  His updated medication list for this problem includes:    Colcrys 0.6 Mg Tabs (Colchicine) .Marland Kitchen... 1 tab twice a day for gout    Ibuprofen 600 Mg Tabs (Ibuprofen) .Marland Kitchen... 1 tab twice a day only as needed for pain  Assessed SCHIZOPHRENIA as comment only - Has not been an active issue. Medications: Removed medication of DOXYCYCLINE HYCLATE 100 MG CAPS (DOXYCYCLINE HYCLATE) 1 cap by mouth two times a day for 14 days Removed medication of PREDNISONE 20 MG TABS (PREDNISONE) sig 2 tabs by mouth for 5 days      Impression & Recommendations:  Problem # 1:  HYPERCHOLESTEROLEMIA (ICD-272.0) FLP checked 3/11 and excellent.  Also sees [facility] (Dr. Acie Fredrickson).  Stable His updated medication list for this problem includes:    Simvastatin 80 Mg Tabs (Simvastatin) .Marland Kitchen... Take 1 tablet by mouth once a day  Problem # 2:  ATRIAL FIBRILLATION (ICD-427.31) On coumadin.  Has INRs monitored at [facility].  Often calls [place] to ask [place] to refill his coumadin.  Remind him we do not check his INRs, [facility] does so they should refill his coumadin.  Stable on troprol.  His updated medication list for this problem includes:    Bayer Childrens Aspirin 81  Mg Chew (Aspirin) .Marland Kitchen... Take 1 tablet by mouth once a day    Coumadin 5 Mg Tabs (Warfarin sodium) .Marland Kitchen... Take 1 tablet by mouth once a day    Metoprolol Succinate 25 Mg Xr24h-tab (Metoprolol succinate) .Marland Kitchen... 1 tab by mouth daily    Plavix 75 Mg Tabs (Clopidogrel bisulfate) .Marland Kitchen... Take 1 tablet by mouth once a day  Problem # 3:  CORONARY, ARTERIOSCLEROSIS (ICD-414.00) Non-STEMI in 12/05.  ON aspirin/plavix since then for maximum medical mangaement.  Followed by [facility].  His updated medication list for this problem includes:    Altace 10 Mg Caps (Ramipril) .Marland Kitchen... 1 tablet twice a day  for blood pressure    Bayer Childrens Aspirin 81 Mg Chew (Aspirin) .Marland Kitchen... Take 1 tablet by mouth once a day    Furosemide 20 Mg Tabs (Furosemide) .Marland Kitchen... Take 1 tablet by mouth every morning    Imdur 60 Mg Tb24 (Isosorbide mononitrate) .Marland Kitchen... Take 1 tablet by mouth once a day    Metoprolol Succinate 25 Mg Xr24h-tab (Metoprolol succinate) .Marland Kitchen... 1 tab by mouth daily    Nitroglycerin 0.4 Mg Subl (Nitroglycerin) .Marland Kitchen... Place 1 tablet under tongue as directed    Plavix 75 Mg Tabs (Clopidogrel bisulfate) .Marland Kitchen... Take 1 tablet by mouth once a day  Problem # 4:  HYPERTENSION, BENIGN ESSENTIAL (ICD-401.1) Stable on current regimen.  As mentioned, also followed by cardiology who occassionally adjust his regimen.  His updated medication list for this problem includes:    Altace 10 Mg Caps (Ramipril) .Marland Kitchen... 1 tablet twice a day  for blood pressure    Furosemide 20 Mg Tabs (Furosemide) .Marland Kitchen... Take 1 tablet by mouth every morning    Metoprolol Succinate 25 Mg Xr24h-tab (Metoprolol succinate) .Marland Kitchen... 1 tab by mouth daily  Problem # 5:  GOUT (ICD-274.9) Has frequent flares, especially if he goes off of his colchicine.  His updated medication list for this problem includes:    Colcrys 0.6 Mg Tabs (Colchicine) .Marland Kitchen... 1 tab twice a day for gout    Ibuprofen 600 Mg Tabs (Ibuprofen) .Marland Kitchen... 1 tab twice a day only as needed for  pain  Problem # 6:  SCHIZOPHRENIA (ICD-295.90) Has not been an active issue.   Complete Medication List: 1)  Altace 10 Mg Caps (Ramipril) .Marland Kitchen.. 1 tablet twice a day  for blood pressure 2)  Bayer Childrens Aspirin 81 Mg Chew (Aspirin) .... Take 1 tablet by mouth once a day 3)  Coumadin 5 Mg Tabs (Warfarin sodium) .... Take 1 tablet by mouth once a day 4)  Flonase 50 Mcg/act Susp (Fluticasone propionate) .... Spray 2 spray into both nostrils once a day 5)  Furosemide 20 Mg Tabs (Furosemide) .... Take 1 tablet by mouth every morning 6)  Imdur 60 Mg Tb24 (Isosorbide mononitrate) .... Take 1 tablet by mouth once a day 7)  Metoprolol Succinate 25 Mg Xr24h-tab (Metoprolol succinate) .Marland Kitchen.. 1 tab by mouth  daily 8)  Nitroglycerin 0.4 Mg Subl (Nitroglycerin) .... Place 1 tablet under tongue as directed 9)  Plavix 75 Mg Tabs (Clopidogrel bisulfate) .... Take 1 tablet by mouth once a day 10)  Simvastatin 80 Mg Tabs (Simvastatin) .... Take 1 tablet by mouth once a day 11)  Colcrys 0.6 Mg Tabs (Colchicine) .Marland Kitchen.. 1 tab twice a day for gout 12)  Qvar 80 Mcg/act Aers (Beclomethasone dipropionate) .... Inhale 1 puff as directed twice a day 13)  Nexium 40 Mg Cpdr (Esomeprazole magnesium) .Marland Kitchen.. 1 tab by mouth daily 14)  Voltaren 1 % Gel (Diclofenac sodium) .... Apply to affected area 3 times a day for 5 days 60 g tube 15)  Ibuprofen 600 Mg Tabs (Ibuprofen) .Marland Kitchen.. 1 tab twice a day only as needed for pain

## 2010-10-13 NOTE — Consult Note (Signed)
[facility]   Im[facility] By: Audie Clear 06/16/2010 15:12:22  _____________________________________________________________________  External Attachment:    Type:   Image     Comment:   External Document

## 2010-10-13 NOTE — Assessment & Plan Note (Signed)
Summary: bp check  Nurse Visit patient in office early today for BP check . BP checked manually with regular cuff. BP RA 160/86 and LA 146/86 pulse 48. states he is feeling well. taking meds as directed. will forward reading to Dr. Luberta Mutter. will call patient back at [phone] with follow up instructions from MD. [name] RN  September 27, 2010 4:52 PM  phone note: Called pt to discuss bp readings.  Pt is to continue medications as prescribed--- except for norvasc 70m which pt is instructed to take 2 tablets=166mdaily now.  (was taking only 1 tablet=83m62m Pt states understanding.  He is to call and schedule a bp follow up appt with me.  At the f/up appt I need to recheck bp and see if there continues to be a discrepancy in bp between the left and right sides.  Jesse Franklin  September 30, 2010 11:33 AM   Allergies: 1)  * Toradol 2)  * Toradol  Orders Added: 1)  No Charge Patient Arrived (NCPA0) [NCPA0]

## 2010-10-13 NOTE — Assessment & Plan Note (Signed)
Summary: anemia/htn/smoking   Vital Signs:  Patient profile:   60 year old male Weight:      167.5 pounds BMI:     26.73 Temp:     97.7 degrees F Pulse rate:   43 / minute BP sitting:   199 / 97  (left arm)  Vitals Entered By: Geanie Cooley RN (July 06, 2010 8:38 AM) CC: f/u Is Patient Diabetic? No Pain Assessment Patient in pain? no        Primary Care Oluwatobiloba Martin:  Dorthey Sawyer MD  CC:  f/u.  History of Present Illness: anemia: Mr. Briseno is a 60 year old man with a past medical history of anemia, HTN, and gout presenting today for a follow up after colonoscopy and EGD for GI bleed workup.  He states that he did follow up with the GI doctors and he says his results for the colonoscopy and EGD were normal.  He also denies any hematochezia, hematemesis, hematuria, or dysuria/dyschezia.  He says he has not had any problems with the oral iron supplements, but does not like taking them 3x/day.  Also states that he has not had his hemoglobin checked recently.  HTN: Elevated today at 199/97. recheck 190/88.  Pt states that he did not take all of his bp medications this am. But that he usually takes all of his blood pressure medications and denies any changes in vision, blurred/double vision, and dizziness, and sometimes complains of some mild headaches.  The patient does not regularly check his blood pressure at home.    Smoking: He is also a smoker (2-3 cigarettes/day) and is interested in quitting with help.   Other: No reports of recent depression and no thoughts of harming self or others today.  No reported problems with any of his medications today.  Habits & Providers  Alcohol-Tobacco-Diet     Tobacco Status: current     Cigarette Packs/Day: <0.25  Current Medications (verified): 1)  Altace 10 Mg Caps (Ramipril) .Marland Kitchen.. 1 Tablet Twice A Day  For Blood Pressure 2)  Bayer Childrens Aspirin 81 Mg Chew (Aspirin) .... Take 1 Tablet By Mouth Once A Day 3)  Coumadin 5 Mg Tabs  (Warfarin Sodium) .... Take 1 Tablet By Mouth Once A Day 4)  Flonase 50 Mcg/act Susp (Fluticasone Propionate) .... Spray 2 Spray Into Both Nostrils Once A Day 5)  Furosemide 20 Mg Tabs (Furosemide) .... Take 1 Tablet By Mouth Every Morning 6)  Imdur 60 Mg Tb24 (Isosorbide Mononitrate) .... Take 1 Tablet By Mouth Once A Day 7)  Metoprolol Succinate 25 Mg Xr24h-Tab (Metoprolol Succinate) .Marland Kitchen.. 1 Tab By Mouth Daily 8)  Nitroglycerin 0.4 Mg Subl (Nitroglycerin) .... Place 1 Tablet Under Tongue As Directed 9)  Plavix 75 Mg Tabs (Clopidogrel Bisulfate) .... Take 1 Tablet By Mouth Once A Day 10)  Simvastatin 80 Mg Tabs (Simvastatin) .... Take 1 Tablet By Mouth Once A Day 11)  Colcrys 0.6 Mg Tabs (Colchicine) .Marland Kitchen.. 1 Tab Twice A Day For Gout 12)  Qvar 80 Mcg/act Aers (Beclomethasone Dipropionate) .... Inhale 1 Puff As Directed Twice A Day 13)  Nexium 40 Mg Cpdr (Esomeprazole Magnesium) .Marland Kitchen.. 1 Tab By Mouth Daily 14)  Voltaren 1 % Gel (Diclofenac Sodium) .... Apply To Affected Area 3 Times A Day For 5 Days 60 G Tube 15)  Ibuprofen 600 Mg Tabs (Ibuprofen) .Marland Kitchen.. 1 Tab Twice A Day Only As Needed For Pain 16)  Ferrous Sulfate 325 (65 Fe) Mg Tabs (Ferrous Sulfate) .... Take Three  Times A Day Between Meals  Allergies (verified): 1)  * Toradol 2)  * Toradol  Review of Systems       as per hpi  Physical Exam  General:  Well-developed,well-nourished,in no acute distress; alert,appropriate and cooperative throughout examination Lungs:  Normal respiratory effort, chest expands symmetrically. Lungs are clear to auscultation, no crackles or wheezes. Heart:  Normal rate and regular rhythm. S1 and S2 normal without gallop, murmur, click, rub or other extra sounds. Extremities:  no edema Skin:  Intact without suspicious lesions or rashes Psych:  No SI or HI   Impression & Recommendations:  Problem # 1:  UNSPECIFIED ANEMIA (ICD-285.9) Pt states that GI upper and lower endoscopy normal.  I have not recieved  formal report and I was unable to find report in E-chart.  Will request that GI resend this to our office.  Will recheck pt hemoglobin level today.  ROS negative except for occasional sob and fatigue.  Will continue to monitor closely and will await GI study results and HgB results and make further plan at that time.  His updated medication list for this problem includes:    Ferrous Sulfate 325 (65 Fe) Mg Tabs (Ferrous sulfate) .Marland Kitchen... Take three times a day between meals  Future Orders: [facility] (69450) ... 06/29/2011  Problem # 2:  HYPERTENSION, BENIGN ESSENTIAL (ICD-401.1) Elevated today but did not take all of bp meds this am.  Pt states that he will take all of his medications and will return in 2-3 days for bp recheck.  No elevations at past few visits.  This is an isolated elevation.  Will consider increasing agents or adding an additional agent if bp remains elevated.   His updated medication list for this problem includes:    Altace 10 Mg Caps (Ramipril) .Marland Kitchen... 1 tablet twice a day  for blood pressure    Furosemide 20 Mg Tabs (Furosemide) .Marland Kitchen... Take 1 tablet by mouth every morning    Metoprolol Succinate 25 Mg Xr24h-tab (Metoprolol succinate) .Marland Kitchen... 1 tab by mouth daily  Orders: [place]- Est  Level 4 (38882)  Problem # 3:  TOBACCO DEPENDENCE (ICD-305.1) Counseled pt to quit.  discussed resources that can be used to help.  Also discussed importance of marijuana cessation.  Orders: [place]- Est  Level 4 (80034)  Problem # 4:  Preventive Health Care (ICD-V70.0) Pt to return in 1-2  month for f/up for anemia and smoking cessation counseling and hypertension.  Pt encouraged to stop smoking marijuna.   Will continue to encourage referral to nutrition for counseling on low sodium, heart healthy diet.  At pt next appt with review PCMH form.   Will also need to discuss with pt that digital prostate exam is needed to complete prostate cancer screening.   Complete Medication List: 1)  Altace 10 Mg Caps  (Ramipril) .Marland Kitchen.. 1 tablet twice a day  for blood pressure 2)  Bayer Childrens Aspirin 81 Mg Chew (Aspirin) .... Take 1 tablet by mouth once a day 3)  Coumadin 5 Mg Tabs (Warfarin sodium) .... Take 1 tablet by mouth once a day 4)  Flonase 50 Mcg/act Susp (Fluticasone propionate) .... Spray 2 spray into both nostrils once a day 5)  Furosemide 20 Mg Tabs (Furosemide) .... Take 1 tablet by mouth every morning 6)  Imdur 60 Mg Tb24 (Isosorbide mononitrate) .... Take 1 tablet by mouth once a day 7)  Metoprolol Succinate 25 Mg Xr24h-tab (Metoprolol succinate) .Marland Kitchen.. 1 tab by mouth daily 8)  Nitroglycerin 0.4  Mg Subl (Nitroglycerin) .... Place 1 tablet under tongue as directed 9)  Plavix 75 Mg Tabs (Clopidogrel bisulfate) .... Take 1 tablet by mouth once a day 10)  Simvastatin 80 Mg Tabs (Simvastatin) .... Take 1 tablet by mouth once a day 11)  Colcrys 0.6 Mg Tabs (Colchicine) .Marland Kitchen.. 1 tab twice a day for gout 12)  Qvar 80 Mcg/act Aers (Beclomethasone dipropionate) .... Inhale 1 puff as directed twice a day 13)  Nexium 40 Mg Cpdr (Esomeprazole magnesium) .Marland Kitchen.. 1 tab by mouth daily 14)  Voltaren 1 % Gel (Diclofenac sodium) .... Apply to affected area 3 times a day for 5 days 60 g tube 15)  Ibuprofen 600 Mg Tabs (Ibuprofen) .Marland Kitchen.. 1 tab twice a day only as needed for pain 16)  Ferrous Sulfate 325 (65 Fe) Mg Tabs (Ferrous sulfate) .... Take three times a day between meals  Patient Instructions: 1)  It was great seeing you today!  Thank you for bringing in all of your medications today. 2)  Please follow up with a nurse visit in 3 days so we can follow up with your blood pressure.  3)   We also want to help you try to stop smoking too - call 1-800-QUIT-NOW for help and advice to help you quit. 4)  Please take all of your blood pressure medication prior to coming to the doctor.  Today your pressure was higher than it has been in the past, and we need you to take your current medications every day so we know what your  pressures are. 5)  Have cardiologist check blood pressure on 10/27 visit.   Orders Added: 1)  [place]- Est  Level 4 [99214] 2)  [facility] [[id number]]

## 2010-10-13 NOTE — Consult Note (Signed)
[facility]   Imported By: Audie Clear 07/20/2010 15:16:13  _____________________________________________________________________  External Attachment:    Type:   Image     Comment:   External Document

## 2010-10-13 NOTE — Progress Notes (Signed)
Summary: Rx Req  Phone Note Refill Request Call back at Home Phone [phone] Message from:  Patient  Refills Requested: Medication #1:  NEXIUM 40 MG CPDR 1 tab by mouth daily [facility] uses [facility]  Pt is out.  Initial call taken by: Raymond Gurney,  November 25, 2009 8:42 AM  Follow-up for Phone Call        Pt notified that rx has been sent in. Follow-up by: Raymond Gurney,  November 30, 2009 8:30 AM    Prescriptions: NEXIUM 40 MG CPDR (ESOMEPRAZOLE MAGNESIUM) 1 tab by mouth daily  #30 x 5   Entered and Authorized by:   Orland Mustard  MD   Signed by:   Orland Mustard  MD on 11/25/2009   Method used:   [facility] [address] [phone]* (retail)       [address]       [place], [place]  [address]       Ph: [phone] or [phone]       Fax: [phone]   RxID:   [id number]

## 2010-10-13 NOTE — Assessment & Plan Note (Signed)
Summary: elevated bp per [facility]   Vital Signs:  Patient profile:   60 year old male Pulse rate:   64 / minute BP sitting:   168 / 95  (right arm)  Vitals Entered By: Mauricia Area CMA, (July 11, 2010 10:21 AM) CC: elevated BP   Primary Care Katalaya Beel:  Dorthey Sawyer MD  CC:  elevated BP.  History of Present Illness: Pt in for 2nd bP recheck with RN- remains elevated.  See RN note.  Pt states that he is taking all of his medications as directed.  He continues to smoke.  No new symptoms.  occasional h/a.  Pt did not bring in medications to today's visit but we reviewed medicine list and he states that he is taking everything on it.   Current Medications (verified): 1)  Altace 10 Mg Caps (Ramipril) .Marland Kitchen.. 1 Tablet Twice A Day  For Blood Pressure 2)  Bayer Childrens Aspirin 81 Mg Chew (Aspirin) .... Take 1 Tablet By Mouth Once A Day 3)  Coumadin 5 Mg Tabs (Warfarin Sodium) .... Take 1 Tablet By Mouth Once A Day 4)  Flonase 50 Mcg/act Susp (Fluticasone Propionate) .... Spray 2 Spray Into Both Nostrils Once A Day 5)  Furosemide 20 Mg Tabs (Furosemide) .... Take 1 Tablet By Mouth Every Morning 6)  Imdur 60 Mg Tb24 (Isosorbide Mononitrate) .... Take 1 Tablet By Mouth Once A Day 7)  Metoprolol Succinate 25 Mg Xr24h-Tab (Metoprolol Succinate) .Marland Kitchen.. 1 Tab By Mouth Daily 8)  Nitroglycerin 0.4 Mg Subl (Nitroglycerin) .... Place 1 Tablet Under Tongue As Directed 9)  Plavix 75 Mg Tabs (Clopidogrel Bisulfate) .... Take 1 Tablet By Mouth Once A Day 10)  Simvastatin 80 Mg Tabs (Simvastatin) .... Take 1 Tablet By Mouth Once A Day 11)  Colcrys 0.6 Mg Tabs (Colchicine) .Marland Kitchen.. 1 Tab Twice A Day For Gout 12)  Qvar 80 Mcg/act Aers (Beclomethasone Dipropionate) .... Inhale 1 Puff As Directed Twice A Day 13)  Nexium 40 Mg Cpdr (Esomeprazole Magnesium) .Marland Kitchen.. 1 Tab By Mouth Daily 14)  Voltaren 1 % Gel (Diclofenac Sodium) .... Apply To Affected Area 3 Times A Day For 5 Days 60 G Tube 15)  Ibuprofen 600 Mg  Tabs (Ibuprofen) .Marland Kitchen.. 1 Tab Twice A Day Only As Needed For Pain 16)  Ferrous Sulfate 325 (65 Fe) Mg Tabs (Ferrous Sulfate) .... Take Three Times A Day Between Meals  Allergies (verified): 1)  * Toradol 2)  * Toradol  Review of Systems       No cp.  No sOB. No bowel or bladder complaints.  no syncope.  ocassional h/a  Physical Exam  General:  VSS Well-developed,well-nourished,in no acute distress; alert,appropriate and cooperative throughout examination Lungs:  Normal respiratory effort, chest expands symmetrically. Heart:  regular rate and irregular rhythm.  Extremities:  no edema   Impression & Recommendations:  Problem # 1:  HYPERTENSION, BENIGN ESSENTIAL (ICD-401.1) increased metoprolol to 5m by mouth daily.  Pt is to return for bp check in 1 week and follow up with me in 2-3 weeks for bp recheck.    His updated medication list for this problem includes:    Altace 10 Mg Caps (Ramipril) ..Marland Kitchen.. 1 tablet twice a day  for blood pressure    Furosemide 20 Mg Tabs (Furosemide) ..Marland Kitchen.. Take 1 tablet by mouth every morning    Metoprolol Succinate 25 Mg Xr24h-tab (Metoprolol succinate) ..Marland Kitchen.. 1 tab by mouth daily  Complete Medication List: 1)  Altace 10 Mg Caps (Ramipril) ..Marland KitchenMarland KitchenMarland Kitchen1  tablet twice a day  for blood pressure 2)  Bayer Childrens Aspirin 81 Mg Chew (Aspirin) .... Take 1 tablet by mouth once a day 3)  Coumadin 5 Mg Tabs (Warfarin sodium) .... Take 1 tablet by mouth once a day 4)  Flonase 50 Mcg/act Susp (Fluticasone propionate) .... Spray 2 spray into both nostrils once a day 5)  Furosemide 20 Mg Tabs (Furosemide) .... Take 1 tablet by mouth every morning 6)  Imdur 60 Mg Tb24 (Isosorbide mononitrate) .... Take 1 tablet by mouth once a day 7)  Metoprolol Succinate 25 Mg Xr24h-tab (Metoprolol succinate) .Marland Kitchen.. 1 tab by mouth daily 8)  Nitroglycerin 0.4 Mg Subl (Nitroglycerin) .... Place 1 tablet under tongue as directed 9)  Plavix 75 Mg Tabs (Clopidogrel bisulfate) .... Take 1 tablet  by mouth once a day 10)  Simvastatin 80 Mg Tabs (Simvastatin) .... Take 1 tablet by mouth once a day 11)  Colcrys 0.6 Mg Tabs (Colchicine) .Marland Kitchen.. 1 tab twice a day for gout 12)  Qvar 80 Mcg/act Aers (Beclomethasone dipropionate) .... Inhale 1 puff as directed twice a day 13)  Nexium 40 Mg Cpdr (Esomeprazole magnesium) .Marland Kitchen.. 1 tab by mouth daily 14)  Voltaren 1 % Gel (Diclofenac sodium) .... Apply to affected area 3 times a day for 5 days 60 g tube 15)  Ibuprofen 600 Mg Tabs (Ibuprofen) .Marland Kitchen.. 1 tab twice a day only as needed for pain 16)  Ferrous Sulfate 325 (65 Fe) Mg Tabs (Ferrous sulfate) .... Take three times a day between meals

## 2010-10-13 NOTE — Progress Notes (Signed)
Summary: RX  Phone Note Refill Request Call back at [facility] Phone [phone]   Refills Requested: Medication #1:  IMDUR 60 MG TB24 Take 1 tablet by mouth once a day [facility]     Prescriptions: IMDUR 60 MG TB24 (ISOSORBIDE MONONITRATE) Take 1 tablet by mouth once a day  #30 x 5   Entered by:   Mauricia Area CMA,   Authorized by:   Orland Mustard  MD   Signed by:   Mauricia Area CMA, on 12/14/2009   Method used:   [facility]        [facility] [address] [phone]* (retail)       [address]       [place], [place]  [address]       Ph: [phone] or [phone]       Fax: [phone]   RxID:   [id number]

## 2010-10-13 NOTE — Progress Notes (Signed)
Summary: refill  Phone Note Refill Request Call back at Home Phone [phone] Message from:  Patient  Refills Requested: Medication #1:  SIMVASTATIN 80 MG TABS Take 1 tablet by mouth once a day  Medication #2:  COLCRYS 0.6 MG TABS 1 tab twice a day for gout  Medication #3:  ALTACE 10 MG CAPS 1 tablet twice a day  for blood pressure Next Appointment Scheduled: 03/17/10 Initial call taken by: Audie Clear,  March 03, 2010 8:32 AM  Follow-up for Phone Call       Follow-up by: Elige Radon RN,  March 03, 2010 8:39 AM    Prescriptions: COLCRYS 0.6 MG TABS (COLCHICINE) 1 tab twice a day for gout  #60 x 1   Entered by:   Elige Radon RN   Authorized by:   Dorthey Sawyer MD   Signed by:   Elige Radon RN on 03/03/2010   Method used:   [facility]        [facility] [address] [phone]* (retail)       [address]       [place], [place]  [address]       Ph: [phone] or [phone]       Fax: [phone]   RxID:   [id number] SIMVASTATIN 80 MG TABS (SIMVASTATIN) Take 1 tablet by mouth once a day  #30 x 1   Entered by:   Elige Radon RN   Authorized by:   Dorthey Sawyer MD   Signed by:   Elige Radon RN on 03/03/2010   Method used:   [facility]        [facility] [address] [phone]* (retail)       [address]       [place], [place]  [address]       Ph: [phone] or [phone]       Fax: [phone]   RxID:   [id number] ALTACE 10 MG CAPS (RAMIPRIL) 1 tablet twice a day  for blood pressure  #60 Capsule x 1   Entered by:   Elige Radon RN   Authorized by:   Dorthey Sawyer MD   Signed by:   Elige Radon RN on 03/03/2010   Method used:   [facility] to        [facility] [address] [phone]* (retail)       [address]       [place], [place]  [address]       Ph: [phone] or [phone]       Fax: [phone]   RxID:   [id number]

## 2010-10-13 NOTE — Assessment & Plan Note (Signed)
Summary: BP CHECK/KH  Nurse Visit  In for BP check as directed.  BP checked manually with regular cuff. RA 120/72,  LA 130/78 pulse 80. states he  is taking all meds as directed. will forwrad reading to MD. [name] RN  March 23, 2010 11:07 AM   Allergies: 1)  * Toradol 2)  * Toradol  Orders Added: 1)  No Charge Patient Arrived (NCPA0) [NCPA0]

## 2010-10-13 NOTE — Miscellaneous (Signed)
  Clinical Lists Changes  Problems: Changed problem from CHRONIC KIDNEY DISEASE STAGE 2 (ICD-585.2) to CHRONIC KIDNEY DISEASE STAGE I (ICD-585.1)      Allergies: 1)  * Toradol 2)  * Toradol

## 2010-10-13 NOTE — Miscellaneous (Signed)
Summary: re: GI appt  Clinical Lists Changes called pt, he answered the phone and when I told him he had an appt tomorrow he said he would call back in an hour. appt scheduled for tomorrow 06/07/10 at 8am with Dr Alferd [facility] ([place], [phone]) for consultation. If pt cannot make this appt he needs to call and reschedule at [phone].Jesse Franklin [facility] CMA  June 06, 2010 1:58 PM.  Pt returned call and requested information to be written down with appt time and office loation so he could pick it up today. Told pt I would write down the information and leave it up front for him.Jesse Franklin [facility] CMA  June 06, 2010 3:22 PM

## 2010-10-13 NOTE — Assessment & Plan Note (Signed)
Summary: bp/left shoulder pain   Vital Signs:  Patient profile:   60 year old male Height:      66.5 inches Weight:      167.4 pounds BMI:     26.71 Temp:     98.2 degrees F oral Pulse rate:   61 / minute BP sitting:   155 / 79  (left arm) Cuff size:   regular  Vitals Entered By: Isabelle Course (March 17, 2010 9:05 AM) CC: Meet new DR, F/U BP and gout, refill meds Is Patient Diabetic? No Pain Assessment Patient in pain? yes     Location: shoulder Intensity: 6 Type: sharp Onset of pain  Chronic   Primary Care Provider:  Dorthey Sawyer MD  CC:  Meet new DR, F/U BP and gout, and refill meds.  History of Present Illness: left shoulder pain: left shoulder pain off and on x 2 months.   sharp pain.  when reaching high. showering. "pain like a tooth ache"  nothing makes it better.  never had before.  Nothing makes it better except limiting movement.  No known injury.  bp elevation: Pt bp trended up during past few visits.     Habits & Providers  Alcohol-Tobacco-Diet     Tobacco Status: current     Tobacco Counseling: to quit use of tobacco products     Cigarette Packs/Day: <0.25  Allergies: 1)  * Toradol 2)  * Toradol  Review of Systems  The patient denies anorexia, chest pain, syncope, dyspnea on exertion, and peripheral edema.    Physical Exam  General:  VSS Well-developed,well-nourished,in no acute distress; alert,appropriate and cooperative throughout examination Lungs:  Normal respiratory effort, chest expands symmetrically. Lungs are clear to auscultation, no crackles or wheezes. Heart:  Normal rate and regular rhythm. S1 and S2 normal without gallop, murmur, click, rub or other extra sounds. Extremities:  empty can test + on left, some pain when reaching with left hand toward center of back.   Unable to lift left arm straight above head.  Full ROM of right shoulder.    Impression & Recommendations:  Problem # 1:  SHOULDER PAIN, LEFT (ICD-719.41)  Pt has  some rotator cuff pain.   Will refer to PT for therapy.   His updated medication list for this problem includes:    Bayer Childrens Aspirin 81 Mg Chew (Aspirin) .Marland Kitchen... Take 1 tablet by mouth once a day    Ibuprofen 600 Mg Tabs (Ibuprofen) .Marland Kitchen... 1 tab twice a day only as needed for pain  Orders: Physical Therapy Referral (PT)  Problem # 2:  HYPERTENSION, BENIGN ESSENTIAL (ICD-401.1) Pt to keep bp log and go to pharmacy to recheck bp if possible.  He will return for RN bp check next week.  Pt states that he takes all of his medicines as directed.  Will continue to follow up on bp and increase meds as needed.   His updated medication list for this problem includes:    Altace 10 Mg Caps (Ramipril) .Marland Kitchen... 1 tablet twice a day  for blood pressure    Furosemide 20 Mg Tabs (Furosemide) .Marland Kitchen... Take 1 tablet by mouth every morning    Metoprolol Succinate 25 Mg Xr24h-tab (Metoprolol succinate) .Marland Kitchen... 1 tab by mouth daily  Orders: [place]- Est Level  3 ([address])  Problem # 3:  TOBACCO DEPENDENCE (ICD-305.1) Pt encouraged to stop tobacco and marijuana use.  Will discuss this further at pt follow up appt.    Orders: [place]- Est Level  3 ([address])  Complete Medication List: 1)  Altace 10 Mg Caps (Ramipril) .Marland Kitchen.. 1 tablet twice a day  for blood pressure 2)  Bayer Childrens Aspirin 81 Mg Chew (Aspirin) .... Take 1 tablet by mouth once a day 3)  Coumadin 5 Mg Tabs (Warfarin sodium) .... Take 1 tablet by mouth once a day 4)  Flonase 50 Mcg/act Susp (Fluticasone propionate) .... Spray 2 spray into both nostrils once a day 5)  Furosemide 20 Mg Tabs (Furosemide) .... Take 1 tablet by mouth every morning 6)  Imdur 60 Mg Tb24 (Isosorbide mononitrate) .... Take 1 tablet by mouth once a day 7)  Metoprolol Succinate 25 Mg Xr24h-tab (Metoprolol succinate) .Marland Kitchen.. 1 tab by mouth daily 8)  Nitroglycerin 0.4 Mg Subl (Nitroglycerin) .... Place 1 tablet under tongue as directed 9)  Plavix 75 Mg Tabs (Clopidogrel bisulfate) ....  Take 1 tablet by mouth once a day 10)  Simvastatin 80 Mg Tabs (Simvastatin) .... Take 1 tablet by mouth once a day 11)  Colcrys 0.6 Mg Tabs (Colchicine) .Marland Kitchen.. 1 tab twice a day for gout 12)  Qvar 80 Mcg/act Aers (Beclomethasone dipropionate) .... Inhale 1 puff as directed twice a day 13)  Nexium 40 Mg Cpdr (Esomeprazole magnesium) .Marland Kitchen.. 1 tab by mouth daily 14)  Voltaren 1 % Gel (Diclofenac sodium) .... Apply to affected area 3 times a day for 5 days 60 g tube 15)  Ibuprofen 600 Mg Tabs (Ibuprofen) .Marland Kitchen.. 1 tab twice a day only as needed for pain  Patient Instructions: 1)  return in 2-3 weeks for bp follow up 2)  return in 1 week for bp recheck with nursing Prescriptions: PLAVIX 75 MG TABS (CLOPIDOGREL BISULFATE) Take 1 tablet by mouth once a day  #31 x 6   Entered and Authorized by:   Dorthey Sawyer MD   Signed by:   Dorthey Sawyer MD on 03/17/2010   Method used:   [facility]ron[facility] to        [facility] [address] [phone]* (retail)       [address]       [place], [place]  [address]       Ph: [phone] or [phone]       Fax: [phone]   RxID:   [id number] ALTACE 10 MG CAPS (RAMIPRIL) 1 tablet twice a day  for blood pressure  #60 Capsule x 6   Entered and Authorized by:   Dorthey Sawyer MD   Signed by:   Dorthey Sawyer MD on 03/17/2010   Method used:   Electronically to        [facility] [address] [phone]* (retail)       [address]       [place], [place]  [address]       Ph: [phone] or [phone]       Fax: [phone]   RxID:   [id number]

## 2010-10-13 NOTE — Miscellaneous (Signed)
Summary: Orders Update/phone note  Clinical Lists Changes Called pt lmvm to return call.Jesse Franklin [facility] CMA  June 06, 2010 11:14 AM spoke with pt, he is coming in for lab visit and stool cards wednesday. informed of Rx at pharmacy but not to start untill after stool cards were done. will also schedule GI referral.[name] CMA  June 06, 2010 11:34 AM   Medications: Added new medication of FERROUS SULFATE 325 (65 FE) MG TABS (FERROUS SULFATE) take three times a day between meals - Signed Rx of FERROUS SULFATE 325 (65 FE) MG TABS (FERROUS SULFATE) take three times a day between meals;  #90 x 2;  Signed;  Entered by: Dorthey Sawyer MD;  Authorized by: Dorthey Sawyer MD;  Method used: [facility] [address] [phone]*, [address] [place], [name], [place]  [address], Ph: [phone] or [phone], Fax: [phone] Orders: Added new Referral order of Gastroenterology Referral (GI) - Signed Added new Test order of Miscellaneous Lab [name] [phone]) - Signed  Called pt to let him know that the office will be calling him to set up the following appts: 1) Lab visit to recieve stool cards and get peripheral smear drawn. (pt is to pick up Iron prescription from pharmacy after turning in stool cards.) 2)appt with GI for EGD and colonoscopy--to look for source of bleeding.   Pt states understanding.   Prescriptions: FERROUS SULFATE 325 (65 FE) MG TABS (FERROUS SULFATE) take three times a day between meals  #90 x 2   Entered and Authorized by:   Dorthey Sawyer MD   Signed by:   Dorthey Sawyer MD on 06/03/2010   Method used:   Electronically to        [facility] [address] [phone]* (retail)       [address]       [place], [place]  [address]       Ph: [phone] or [phone]       Fax: [phone]   RxID:   [id number]

## 2010-10-13 NOTE — Assessment & Plan Note (Signed)
Summary: bp follow up   Vital Signs:  Patient profile:   60 year old male Weight:      166.6 pounds Temp:     98.7 degrees F oral Pulse rate:   48 / minute Pulse rhythm:   regular BP sitting:   151 / 84  (left arm) Cuff size:   regular  Vitals Entered By: Audelia Hives CMA (April 04, 2010 9:13 AM)  Primary Care Provider:  Dorthey Sawyer MD   History of Present Illness: BP recheck: BP increased to 151/84.  Had come since last visit for BP check here at office and BP was wnl.  Pt states that he feels good.  No CP, NO SoB, no edema, no h/a  left Shoulder pain: continues to have left shoulder pain as per previous exam.  States no changes or improvement.  Pain increases with movement.  PT appt scheduled.  Has not yet been evaluated by pt.    Current Medications (verified): 1)  Altace 10 Mg Caps (Ramipril) .Marland Kitchen.. 1 Tablet Twice A Day  For Blood Pressure 2)  Bayer Childrens Aspirin 81 Mg Chew (Aspirin) .... Take 1 Tablet By Mouth Once A Day 3)  Coumadin 5 Mg Tabs (Warfarin Sodium) .... Take 1 Tablet By Mouth Once A Day 4)  Flonase 50 Mcg/act Susp (Fluticasone Propionate) .... Spray 2 Spray Into Both Nostrils Once A Day 5)  Furosemide 20 Mg Tabs (Furosemide) .... Take 1 Tablet By Mouth Every Morning 6)  Imdur 60 Mg Tb24 (Isosorbide Mononitrate) .... Take 1 Tablet By Mouth Once A Day 7)  Metoprolol Succinate 25 Mg Xr24h-Tab (Metoprolol Succinate) .Marland Kitchen.. 1 Tab By Mouth Daily 8)  Nitroglycerin 0.4 Mg Subl (Nitroglycerin) .... Place 1 Tablet Under Tongue As Directed 9)  Plavix 75 Mg Tabs (Clopidogrel Bisulfate) .... Take 1 Tablet By Mouth Once A Day 10)  Simvastatin 80 Mg Tabs (Simvastatin) .... Take 1 Tablet By Mouth Once A Day 11)  Colcrys 0.6 Mg Tabs (Colchicine) .Marland Kitchen.. 1 Tab Twice A Day For Gout 12)  Qvar 80 Mcg/act Aers (Beclomethasone Dipropionate) .... Inhale 1 Puff As Directed Twice A Day 13)  Nexium 40 Mg Cpdr (Esomeprazole Magnesium) .Marland Kitchen.. 1 Tab By Mouth Daily 14)  Voltaren 1 % Gel  (Diclofenac Sodium) .... Apply To Affected Area 3 Times A Day For 5 Days 60 G Tube 15)  Ibuprofen 600 Mg Tabs (Ibuprofen) .Marland Kitchen.. 1 Tab Twice A Day Only As Needed For Pain  Allergies (verified): 1)  * Toradol 2)  * Toradol  Review of Systems       as per hpi  Physical Exam  General:  Well-developed,well-nourished,in no acute distress; alert,appropriate and cooperative throughout examination Lungs:  Normal respiratory effort, chest expands symmetrically. Lungs are clear to auscultation, no crackles or wheezes. Heart:  HR low to low normal- approx 50-60 on recheck.   and regular rhythm. S1 and S2 normal without gallop, murmur, click, rub or other extra sounds. Extremities:  no edema Psych:  Oriented X3.     Impression & Recommendations:  Problem # 1:  HYPERTENSION, BENIGN ESSENTIAL (ICD-401.1) Will continue BP medication regimen.  Will consider increasing or adding agent in future if elevated bp persistent..  BP at rn visit was wNL and today elevated.  Pt to return for rn visit bp recheck in 2 weeks.  At that time will ask RN to write down all bp readings over past few months so that he can carry these numbers to his visit with the cardiologist.  Will ask cardiologist to weigh in on their thoughts about BP management since he has a complex cardiac hx and is already on 3 agents.  I do not want to go up on Metoprolol since HR tends to be low to low  normal.  Pt to return in 3 months for follow up.  His updated medication list for this problem includes:    Altace 10 Mg Caps (Ramipril) .Marland Kitchen... 1 tablet twice a day  for blood pressure    Furosemide 20 Mg Tabs (Furosemide) .Marland Kitchen... Take 1 tablet by mouth every morning    Metoprolol Succinate 25 Mg Xr24h-tab (Metoprolol succinate) .Marland Kitchen... 1 tab by mouth daily  Orders: [place]- Est Level  3 ([address])  Problem # 2:  SHOULDER PAIN, LEFT (ICD-719.41) Pt is to go to scheduled pt appt.  His updated medication list for this problem includes:    Bayer Childrens  Aspirin 81 Mg Chew (Aspirin) .Marland Kitchen... Take 1 tablet by mouth once a day    Ibuprofen 600 Mg Tabs (Ibuprofen) .Marland Kitchen... 1 tab twice a day only as needed for pain  Orders: [place]- Est Level  3 ([address])  Problem # 3:  CORONARY, ARTERIOSCLEROSIS (ICD-414.00) Pt to discuss possible enrollement into cardiac rehab with his cardiologist.  He will ask at his next appt if he is a good canidate.  Pt has an appt in August.   His updated medication list for this problem includes:    Altace 10 Mg Caps (Ramipril) .Marland Kitchen... 1 tablet twice a day  for blood pressure    Bayer Childrens Aspirin 81 Mg Chew (Aspirin) .Marland Kitchen... Take 1 tablet by mouth once a day    Furosemide 20 Mg Tabs (Furosemide) .Marland Kitchen... Take 1 tablet by mouth every morning    Imdur 60 Mg Tb24 (Isosorbide mononitrate) .Marland Kitchen... Take 1 tablet by mouth once a day    Metoprolol Succinate 25 Mg Xr24h-tab (Metoprolol succinate) .Marland Kitchen... 1 tab by mouth daily    Nitroglycerin 0.4 Mg Subl (Nitroglycerin) .Marland Kitchen... Place 1 tablet under tongue as directed    Plavix 75 Mg Tabs (Clopidogrel bisulfate) .Marland Kitchen... Take 1 tablet by mouth once a day  Problem # 4:  Preventive Health Care (ICD-V70.0) Pt is due for PSA screening.  Pt has refused in past.  Will talk with pt about possible screening at next appt.  Also Will dicuss possible MNT to help with decreasing sodium.    Problem # 5:  TOBACCO DEPENDENCE (ICD-305.1) Pt states that he continues to smoke 3 cig per day.  Will focus discussion on smoking cessation at next visit.  Will offer to pt smoking cessation classes.   Complete Medication List: 1)  Altace 10 Mg Caps (Ramipril) .Marland Kitchen.. 1 tablet twice a day  for blood pressure 2)  Bayer Childrens Aspirin 81 Mg Chew (Aspirin) .... Take 1 tablet by mouth once a day 3)  Coumadin 5 Mg Tabs (Warfarin sodium) .... Take 1 tablet by mouth once a day 4)  Flonase 50 Mcg/act Susp (Fluticasone propionate) .... Spray 2 spray into both nostrils once a day 5)  Furosemide 20 Mg Tabs (Furosemide) .... Take 1  tablet by mouth every morning 6)  Imdur 60 Mg Tb24 (Isosorbide mononitrate) .... Take 1 tablet by mouth once a day 7)  Metoprolol Succinate 25 Mg Xr24h-tab (Metoprolol succinate) .Marland Kitchen.. 1 tab by mouth daily 8)  Nitroglycerin 0.4 Mg Subl (Nitroglycerin) .... Place 1 tablet under tongue as directed 9)  Plavix 75 Mg Tabs (Clopidogrel bisulfate) .... Take 1 tablet by mouth  once a day 10)  Simvastatin 80 Mg Tabs (Simvastatin) .... Take 1 tablet by mouth once a day 11)  Colcrys 0.6 Mg Tabs (Colchicine) .Marland Kitchen.. 1 tab twice a day for gout 12)  Qvar 80 Mcg/act Aers (Beclomethasone dipropionate) .... Inhale 1 puff as directed twice a day 13)  Nexium 40 Mg Cpdr (Esomeprazole magnesium) .Marland Kitchen.. 1 tab by mouth daily 14)  Voltaren 1 % Gel (Diclofenac sodium) .... Apply to affected area 3 times a day for 5 days 60 g tube 15)  Ibuprofen 600 Mg Tabs (Ibuprofen) .Marland Kitchen.. 1 tab twice a day only as needed for pain  Patient Instructions: 1)  return for bp check- set up an appt with RN- in 2 weeks 2)  At RN visit have them write down all of your Blood pressure levels so you can take them to your cardiology visit 3)  Ask your cardiologist if you are a good canidate for cardiac rehab. 4)  Go to PT appt as scheduled   Prevention & Chronic Care Immunizations   Influenza vaccine: refused  (06/04/2008)   Influenza vaccine deferral: Not available  (04/04/2010)   Influenza vaccine due: 06/10/2010    Tetanus booster: 06/04/2008: given   Tetanus booster due: 06/04/2018    Pneumococcal vaccine: Not documented  Colorectal Screening   Hemoccult: Done.  (08/16/2005)   Hemoccult action/deferral: Not indicated  (04/04/2010)   Hemoccult due: 08/16/2006    Colonoscopy: normal  (12/10/2005)   Colonoscopy due: 12/11/2015  Other Screening   PSA: 1.05  (08/19/2008)   PSA due due: 06/10/2010   Smoking status: current  (03/17/2010)   Smoking cessation counseling: yes  (08/19/2008)  Lipids   Total Cholesterol: 125   (12/02/2009)   LDL: 66  (12/02/2009)   LDL Direct: Not documented   HDL: 40  (12/02/2009)   Triglycerides: 97  (12/02/2009)   Lipid panel due: 09/01/2009    SGOT (AST): 21  (12/02/2009)   SGPT (ALT): 13  (12/02/2009)   Alkaline phosphatase: 87  (12/02/2009)   Total bilirubin: 0.6  (12/02/2009)    Lipid flowsheet reviewed?: Yes   Progress toward LDL goal: At goal  Hypertension   Last Blood Pressure: 151 / 84  (04/04/2010)   Serum creatinine: 1.06  (12/02/2009)   Serum potassium 3.3  (12/02/2009)    Hypertension flowsheet reviewed?: Yes   Progress toward BP goal: Improved  Self-Management Support :   Personal Goals (by the next clinic visit) :      Personal blood pressure goal: 140/90  (06/02/2009)     Personal LDL goal: 100  (06/02/2009)    Hypertension self-management support: Not documented    Lipid self-management support: Not documented

## 2010-10-13 NOTE — Assessment & Plan Note (Signed)
Summary: FU/KH   Vital Signs:  Patient profile:   60 year old male Height:      66.5 inches Weight:      168 pounds BMI:     26.81 Temp:     98.1 degrees F oral Pulse rate:   73 / minute BP sitting:   160 / 96  (left arm) Cuff size:   regular  Vitals Entered By: Schuyler Amor CMA (December 02, 2009 9:39 AM) CC: F/U Is Patient Diabetic? No Pain Assessment Patient in pain? no        Primary Care Provider:  Orland Mustard  MD  CC:  F/U.  History of Present Illness: Jesse Franklin comes in today for follow-up of his HTN, gout, and also complains of ankle swelling. 1) HTN - also followed by [facility] (Dr. Acie Fredrickson) as he has PAF (on coumadin) and HLD.  On ramipril, toprol, and lasix.  Did not take his lasix this morning. Endorses taking only 1/2 tablet of his toprol 13m.  States someone told him to do this "a long time ago".  Tolerating the mds well.  Due for Cr check.  [facility]  2) Gout - needs refill on colchicine.  On medicare now.  Will likely not pay for brand colcrys, which is all that is available tnow.  Uses ibuprofen as needed fo knee pain.  Discussed may need to try this.  If gout worsens, can consider different controller medication. 3) Ankle swelling- for last few months notices occassional ankle swelling, no more than once a week.  At the end of theday when he has been up a lot.  Both ankels the same.  Better when hewakes up the next morning.  No CP or dyspnea on exertion or SOB.   Habits & Providers  Alcohol-Tobacco-Diet     Tobacco Status: current     Cigarette Packs/Day: <0.25  Allergies: 1)  * Toradol 2)  * Toradol  Social History: Packs/Day:  <0.25  Physical Exam  General:  thin, alert, NaD, cooperative to exam vitals reviewed.  Lungs:  Normal respiratory effort, chest expands symmetrically. Lungs are clear to auscultation, no crackles or wheezes. Heart:  irreg irreg rhythm, no murmur Pulses:  2+ radial and dp p  ulses Extremities:  no edema   Impression & Recommendations:  Problem # 1:  HYPERTENSION, BENIGN ESSENTIAL (ICD-401.1) Assessment Deteriorated  Not at goal.  Advised to take full tab of metoprolol.   His updated medication list for this problem includes:    Altace 10 Mg Caps (Ramipril) ..Marland Kitchen.. 1 tablet twice a day  for blood pressure    Furosemide 20 Mg Tabs (Furosemide) ..Marland Kitchen.. Take 1 tablet by mouth every morning    Metoprolol Succinate 25 Mg Xr24h-tab (Metoprolol succinate) ..Marland Kitchen.. 1 tab by mouth daily  Orders: FDavey Est  Level 4 ((22633  Problem # 2:  GOUT (ICD-274.9) Assessment: Unchanged  Gave Rx for colcrys and ibuprofen 600 prn His updated medication list for this problem includes:    Colcrys 0.6 Mg Tabs (Colchicine) ..Marland Kitchen.. 1 tab twice a day for gout    Ibuprofen 600 Mg Tabs (Ibuprofen) ..Marland Kitchen.. 1 tab twice a day only as needed for pain  Orders: [place] Est  Level 4 ((35456  Problem # 3:  LEG EDEMA, BILATERAL (ICD-782.3) Assessment: New  Not present on exam today.  Intermittent problem.  Sounds benign, already on Lasix and followed by cardiology.   medication list for this problem includes:  Furosemide 20 Mg Tabs (Furosemide) .Marland Kitchen... Take 1 tablet by mouth every morning  Orders: [place]- Est  Level 4 (99214)  Complete Medication List: 1)  Altace 10 Mg Caps (Ramipril) .Marland Kitchen.. 1 tablet twice a day  for blood pressure 2)  Bayer Childrens Aspirin 81 Mg Chew (Aspirin) .... Take 1 tablet by mouth once a day 3)  Coumadin 5 Mg Tabs (Warfarin sodium) .... Take 1 tablet by mouth once a day 4)  Flonase 50 Mcg/act Susp (Fluticasone propionate) .... Spray 2 spray into both nostrils once a day 5)  Furosemide 20 Mg Tabs (Furosemide) .... Take 1 tablet by mouth every morning 6)  Imdur 60 Mg Tb24 (Isosorbide mononitrate) .... Take 1 tablet by mouth once a day 7)  Metoprolol Succinate 25 Mg Xr24h-tab (Metoprolol succinate) .Marland Kitchen.. 1 tab by mouth daily 8)  Nitroglycerin 0.4 Mg Subl (Nitroglycerin) ....  Place 1 tablet under tongue as directed 9)  Plavix 75 Mg Tabs (Clopidogrel bisulfate) .... Take 1 tablet by mouth once a day 10)  Simvastatin 80 Mg Tabs (Simvastatin) .... Take 1 tablet by mouth once a day 11)  Colcrys 0.6 Mg Tabs (Colchicine) .Marland Kitchen.. 1 tab twice a day for gout 12)  Qvar 80 Mcg/act Aers (Beclomethasone dipropionate) .... Inhale 1 puff as directed twice a day 13)  Nexium 40 Mg Cpdr (Esomeprazole magnesium) .Marland Kitchen.. 1 tab by mouth daily 14)  Prednisone 20 Mg Tabs (Prednisone) .... Sig 2 tabs by mouth for 5 days 15)  Voltaren 1 % Gel (Diclofenac sodium) .... Apply to affected area 3 times a day for 5 days 60 g tube 16)  Doxycycline Hyclate 100 Mg Caps (Doxycycline hyclate) .Marland Kitchen.. 1 cap by mouth two times a day for 14 days 17)  Ibuprofen 600 Mg Tabs (Ibuprofen) .Marland Kitchen.. 1 tab twice a day only as needed for pain  Other Orders: Lipid-[facility] ([id number]) Comp Met-[facility] ([id number])  Patient Instructions: 1)  Start taking 1 full tab of metoprolol everyday. 2)  I have changed your colchicine to the one available now.  Medicare may not pay for it.  If it is too expensive, then don't get it.  You can use the ibuprofen when you need it for pain.  Only use the ibuprofen when you need it, not everyday. 3)  Come back in 3 months for your next check-up. Prescriptions: IBUPROFEN 600 MG TABS (IBUPROFEN) 1 tab twice a day only as needed for pain  #45 x 2   Entered and Authorized by:   Orland Mustard  MD   Signed by:   Orland Mustard  MD on 12/02/2009   Method used:   Print then Give to Patient   RxID:   [id number] COLCRYS 0.6 MG TABS (COLCHICINE) 1 tab twice a day for gout  #60 x 3   Entered and Authorized by:   Orland Mustard  MD   Signed by:   Orland Mustard  MD on 12/02/2009   Method used:   Print then Give to Patient   RxID:   [id number]

## 2010-10-13 NOTE — Miscellaneous (Signed)
  Clinical Lists Changes  Observations: Added new observation of DM PROGRESS: N/A (07/24/2010 18:36) Added new observation of DM FSREVIEW: N/A (07/24/2010 18:36) Added new observation of WBC COUNT: 3.6 10*3/microliter (05/25/2010 18:36) Added new observation of IRON SATUR %: 9 % (05/25/2010 18:36) Added new observation of IRON: 37 mcg/dL (05/25/2010 18:36) Added new observation of HGB: 9.2 g/dL (05/25/2010 18:36) Added new observation of FOLATE: 10.8 ng/mL (05/25/2010 18:36) Added new observation of B12: 232 pg/mL (05/25/2010 18:36)      Prevention & Chronic Care Immunizations   Influenza vaccine: refused  (06/04/2008)   Influenza vaccine deferral: Not available  (04/04/2010)   Influenza vaccine due: 06/10/2010    Tetanus booster: 06/04/2008: given   Tetanus booster due: 06/04/2018    Pneumococcal vaccine: Not documented  Colorectal Screening   Hemoccult: Done.  (08/16/2005)   Hemoccult action/deferral: Not indicated  (04/04/2010)   Hemoccult due: 08/16/2006    Colonoscopy: normal  (12/10/2005)   Colonoscopy due: 12/11/2015  Other Screening   PSA: 0.98  (05/31/2010)   PSA due due: 06/10/2010   Smoking status: current  (07/06/2010)   Smoking cessation counseling: yes  (08/19/2008)  Lipids   Total Cholesterol: 125  (12/02/2009)   LDL: 66  (12/02/2009)   LDL Direct: Not documented   HDL: 40  (12/02/2009)   Triglycerides: 97  (12/02/2009)   Lipid panel due: 09/01/2009    SGOT (AST): 21  (12/02/2009)   SGPT (ALT): 13  (12/02/2009)   Alkaline phosphatase: 87  (12/02/2009)   Total bilirubin: 0.6  (12/02/2009)  Hypertension   Last Blood Pressure: 168 / 95  (07/11/2010)   Serum creatinine: 1.06  (12/02/2009)   Serum potassium 3.3  (12/02/2009)  Self-Management Support :   Personal Goals (by the next clinic visit) :      Personal blood pressure goal: 140/90  (06/02/2009)     Personal LDL goal: 100  (06/02/2009)    Hypertension self-management support: Not  documented    Lipid self-management support: Not documented

## 2010-10-13 NOTE — Progress Notes (Signed)
----   Converted from flag ---- ---- 06/03/2010 2:23 PM, Dorthey Sawyer MD wrote: Also can you schedule him an appt with the lab--I am going to order a peripheral smear on him.  Thanks, Dawn ------------------------------  called pt, scheduled him lab visit for wednesday 06/08/10. will also give and explain stool card at that visit.

## 2010-10-13 NOTE — Assessment & Plan Note (Signed)
Summary: BP CHECK/KH  Nurse Visit patient came in for BP check this AM . BP checked manually with regular adult cuff.  BP LA 180/104 and RA 180/98 pulse 60. states he is taking meds as MD directed. taking metoprolol 50 mg daily. consulted with  Dr.  Truman Hayward. Dr. Luberta Mutter will be in clinic this afternoon.. will discuss with her then. patient denies any chest pain or headache today. states he feels fine.   states he had 2-3 sec episode of burning senation in chest last week and had a headache on 07/14/2010.  Dr. Luberta Mutter notified of findings today at  approx. 11:00. she will address this afternoon. [name] RN  July 18, 2010 11:28 AM    [facility] [place]. [name] RN  July 18, 2010 11:36 AM  I called pt and left VM for him to call back so we can go over the below medicine changes.  I would like to add norvasc 66m daily to help better control his hypertension. I will need to reduce his simvastatin dosage to 251mto avoid any interaction between norvasc and simvastatin.  Will call in a 2 month supply of this reduced dosage of simvastatin.  I will recheck his lipid panel in 2 months to ensure that his lipids are wnl.  If not I can start him on lipitor which will be generic on 08/10/10.  also pt needs to return within 1-2 weeks for bp recheck with RN.  When pt calls we can go ahead and schedule him for that appt.  he should see me in approx 1 month for reeval of this issue.  DaDorthey SawyerD  July 18, 2010 5:12 PM      Allergies: 1)  * Toradol 2)  * Toradol  Orders Added: 1)  No Charge Patient Arrived (NCPA0) [NCPA0] Prescriptions: NORVASC 5 MG TABS (AMLODIPINE BESYLATE) take 1 tablet daily  #30 x 3   Entered and Authorized by:   DaDorthey SawyerD   Signed by:   DaDorthey SawyerD on 07/18/2010   Method used:   [phone]       [id number]       [place]  [id number]     Ph:  [phone]     Fax: [phone] RxID:   [id number] 20 MG TABS (SIMVASTATIN) take 1 tablet daily  #30 x 1   Entered and Authorized by:   DaDorthey SawyerD   Signed by:   DaDorthey SawyerD on 07/18/2010   Method used:   [phone]       [id number]      [place]  [id number]     Ph: [phone]     Fax: [phone] RxID:   [id number] with patient and gave him message from MD. he will pick up meds tomorrow and understands about the simvastatin. he already has a scheduled appointment with Dr. CaLuberta Mutteror 07/28/2010 so will keep that appointment and plan to recheck BP at that visit. told him to come back in 2 months for labs to check cholesterol. PaMarcell BarlowN  July 18, 2010 5:29 PM

## 2010-10-13 NOTE — Progress Notes (Signed)
  Phone Note Outgoing Call   Call placed by: MD Call placed to: Patient Summary of Call: Called to tell pt that GI recommended decreasing iron dosage to two times a day.  Pt states understanding.  GI is supposed to call him and give him a follow up appt for in 1 month.  Also reviewed with pt his current bp medication regimen to ensure that he understood.  Pt states understanding.  Call placed on thurs 07/21/10  at approx 5:30pm.  Dorthey Sawyer MD  July 24, 2010 2:50 PM

## 2010-10-17 ENCOUNTER — Encounter (INDEPENDENT_AMBULATORY_CARE_PROVIDER_SITE_OTHER): Payer: PRIVATE HEALTH INSURANCE

## 2010-10-17 DIAGNOSIS — I4891 Unspecified atrial fibrillation: Secondary | ICD-10-CM

## 2010-10-17 NOTE — Assessment & Plan Note (Signed)
Summary: HTN/cholesterol/myalgias   Vital Signs:  Patient profile:   60 year old male Height:      66.5 inches Weight:      171 pounds BMI:     27.28 Temp:     98.1 degrees F oral Pulse rate:   46 / minute BP sitting:   143 / 88  (left arm) Cuff size:   large  Vitals Entered By: Schuyler Amor CMA (August 29, 2010 8:43 AM) CC: F/U   Primary Care Provider:  Dorthey Sawyer MD  CC:  F/U.  History of Present Illness: BP recheck: Pt states that he is taking all of his medications as directed. He still is not taking his am medications all at once.  He is convinced that it is bad to take them all together so he takes them spread out over the morning time, despite my reassurance that this is safe.    He states that he is not forgetting any of his medications.  BP recheck manual by me was consistent with traige bp 142/88.  no dizziness, no syncope, no visual changes.  muscle aches: states that these resolved when we stopped the simvastatin and placed him on the pravastatin.  no side effects or complaints with the pravastatin.    gout: pt states that he is happy with the colcrys. He feels his gout is in very good control.  He states he doesn't remember if he has been on allopurinol in the past.  He states and that he doesn't want to changes gout regimen to allopurinol here at the holidays since he feels he has been in such good control.  no recent gout flares.  Current Medications (verified): 1)  Altace 10 Mg Caps (Ramipril) .Marland Kitchen.. 1 Tablet Twice A Day  For Blood Pressure 2)  Bayer Childrens Aspirin 81 Mg Chew (Aspirin) .... Take 1 Tablet By Mouth Once A Day 3)  Coumadin 5 Mg Tabs (Warfarin Sodium) .... Take 1 Tablet By Mouth Once A Day 4)  Flonase 50 Mcg/act Susp (Fluticasone Propionate) .... Spray 2 Spray Into Both Nostrils Once A Day 5)  Furosemide 20 Mg Tabs (Furosemide) .... Take 1 Tablet By Mouth Every Morning 6)  Imdur 60 Mg Tb24 (Isosorbide Mononitrate) .... Take 1 Tablet By Mouth  Once A Day 7)  Metoprolol Succinate 25 Mg Xr24h-Tab (Metoprolol Succinate) .Marland Kitchen.. 1 Tab By Mouth Daily 8)  Nitroglycerin 0.4 Mg Subl (Nitroglycerin) .... Place 1 Tablet Under Tongue As Directed 9)  Plavix 75 Mg Tabs (Clopidogrel Bisulfate) .... Take 1 Tablet By Mouth Once A Day 10)  Colcrys 0.6 Mg Tabs (Colchicine) .Marland Kitchen.. 1 Tab Twice A Day For Gout 11)  Qvar 80 Mcg/act Aers (Beclomethasone Dipropionate) .... Inhale 1 Puff As Directed Twice A Day 12)  Nexium 40 Mg Cpdr (Esomeprazole Magnesium) .Marland Kitchen.. 1 Tab By Mouth Daily 13)  Voltaren 1 % Gel (Diclofenac Sodium) .... Apply To Affected Area 3 Times A Day For 5 Days 60 G Tube 14)  Ferrous Sulfate 325 (65 Fe) Mg Tabs (Ferrous Sulfate) .... Take Two Times A Day Between Meals 15)  Norvasc 5 Mg Tabs (Amlodipine Besylate) .... Take 1 Tablet Daily 16)  Pravachol 40 Mg Tabs (Pravastatin Sodium) .... Take 1 Daily  Allergies (verified): 1)  * Toradol 2)  * Toradol  Review of Systems       as per hpi  Physical Exam  General:  Well-developed,well-nourished,in no acute distress; alert,appropriate and cooperative throughout examination Lungs:  Normal respiratory effort, chest expands symmetrically.  Heart:  HR 46, irregular, no m/r/g. Msk:  normal gait Extremities:  no edema Skin:  Intact without suspicious lesions or rashes Psych:  Cognition and judgment appear intact. Alert and cooperative with normal attention span and concentration. No apparent delusions, illusions, hallucinations   Impression & Recommendations:  Problem # 1:  HYPERTENSION, BENIGN ESSENTIAL (ICD-401.1)  Bp boarderline high today.  Will continue to monitor for now.  Pt to return in 1 month for bp recheck with rn.  and in 2-3 months for follow up appt with me.  Will need to recheck renal function in march.  HR 46- pt assymptomatic.  If symptomatic could decrease metformin to 12.5m daily but will hold for now and monitor.  His updated medication list for this problem includes:     Altace 10 Mg Caps (Ramipril) ..Marland Kitchen.. 1 tablet twice a day  for blood pressure    Furosemide 20 Mg Tabs (Furosemide) ..Marland Kitchen.. Take 1 tablet by mouth every morning    Metoprolol Succinate 25 Mg Xr24h-tab (Metoprolol succinate) ..Marland Kitchen.. 1 tab by mouth daily    Norvasc 5 Mg Tabs (Amlodipine besylate) ..Marland Kitchen.. Take 1 tablet daily  Orders: F[place] Est  Level 4 ((27253  Problem # 2:  MYALGIA (ICD-729.1)  improved after d/c of simvastatin.  now resolved.  pt on pravastatin.  Will recheck lipids and liver panel in march 2012.  His updated medication list for this problem includes:    Bayer Childrens Aspirin 81 Mg Chew (Aspirin) ..Marland Kitchen.. Take 1 tablet by mouth once a day  Orders: F[place] Est  Level 4 ((66440  Problem # 3:  HYPERCHOLESTEROLEMIA (ICD-272.0)  see problem #2.    His updated medication list for this problem includes:    Pravachol 40 Mg Tabs (Pravastatin sodium) ..Marland Kitchen.. Take 1 daily  Orders: FNeb[place] Est  Level 4 (99214)  Problem # 4:  GOUT (ICD-274.9)  Pt states that his gout is best controlled with colcrys.  We discussed that allopurinol is one medication that is best used for chronic control.  Pt does not want to make any med changes at this time before the holidays b/c he feels he is in such good control currently.  We discuss further at follow up appt.  Will consider recheck of uric acid level.  And will discuss the pros and cons of colchicine vs allopurinol for chronic management.  no recent gout flares.   His updated medication list for this problem includes:    Colcrys 0.6 Mg Tabs (Colchicine) ..Marland Kitchen.. 1 tab twice a day for gout  Orders: [place] Est  Level 4 ((34742  Problem # 5:  prevention: when problems 1-4 improved and time allows will review pcmh form with pt.   Complete Medication List: 1)  Altace 10 Mg Caps (Ramipril) ..Marland Kitchen. 1 tablet twice a day  for blood pressure 2)  Bayer Childrens Aspirin 81 Mg Chew (Aspirin) .... Take 1 tablet by mouth once a day 3)  Coumadin 5 Mg Tabs (Warfarin  sodium) .... Take 1 tablet by mouth once a day 4)  Flonase 50 Mcg/act Susp (Fluticasone propionate) .... Spray 2 spray into both nostrils once a day 5)  Furosemide 20 Mg Tabs (Furosemide) .... Take 1 tablet by mouth every morning 6)  Imdur 60 Mg Tb24 (Isosorbide mononitrate) .... Take 1 tablet by mouth once a day 7)  Metoprolol Succinate 25 Mg Xr24h-tab (Metoprolol succinate) ..Marland Kitchen. 1 tab by mouth daily 8)  Nitroglycerin 0.4 Mg Subl (Nitroglycerin) .... Place 1 tablet under tongue as directed  9)  Plavix 75 Mg Tabs (Clopidogrel bisulfate) .... Take 1 tablet by mouth once a day 10)  Colcrys 0.6 Mg Tabs (Colchicine) .Marland Kitchen.. 1 tab twice a day for gout 11)  Qvar 80 Mcg/act Aers (Beclomethasone dipropionate) .... Inhale 1 puff as directed twice a day 12)  Nexium 40 Mg Cpdr (Esomeprazole magnesium) .Marland Kitchen.. 1 tab by mouth daily 13)  Voltaren 1 % Gel (Diclofenac sodium) .... Apply to affected area 3 times a day for 5 days 60 g tube 14)  Ferrous Sulfate 325 (65 Fe) Mg Tabs (Ferrous sulfate) .... Take two times a day between meals 15)  Norvasc 5 Mg Tabs (Amlodipine besylate) .... Take 1 tablet daily 16)  Pravachol 40 Mg Tabs (Pravastatin sodium) .... Take 1 daily  Patient Instructions: 1)  return in January for bp recheck.- make nurse visit. 2)  return in 2-3 months for follow up appt.  3)  will talk more about gout managment at next appt.  4)  1-800-Quit-now- smoking cessation help.   Orders Added: 1)  [place]- Est  Level 4 [29528]

## 2010-10-21 ENCOUNTER — Other Ambulatory Visit: Payer: Self-pay | Admitting: Family Medicine

## 2010-10-21 NOTE — Telephone Encounter (Signed)
Refill request

## 2010-10-24 ENCOUNTER — Telehealth: Payer: Self-pay | Admitting: Family Medicine

## 2010-10-24 DIAGNOSIS — I10 Essential (primary) hypertension: Secondary | ICD-10-CM

## 2010-10-25 MED ORDER — AMLODIPINE BESYLATE 5 MG PO TABS: 5.0000 mg | ORAL_TABLET | Freq: Every day | ORAL | Status: DC

## 2010-10-25 NOTE — Telephone Encounter (Signed)
Can you guys check on this please?

## 2010-10-25 NOTE — Telephone Encounter (Signed)
Pt states that he is now on 2 pills of amlodipine and uses [facility] Has appt on 2/22

## 2010-10-30 ENCOUNTER — Other Ambulatory Visit: Payer: Self-pay | Admitting: Family Medicine

## 2010-10-30 DIAGNOSIS — I10 Essential (primary) hypertension: Secondary | ICD-10-CM

## 2010-10-30 MED ORDER — AMLODIPINE BESYLATE 10 MG PO TABS: 10.0000 mg | ORAL_TABLET | Freq: Every day | ORAL | Status: DC

## 2010-11-01 ENCOUNTER — Other Ambulatory Visit: Payer: Self-pay | Admitting: Family Medicine

## 2010-11-01 NOTE — Telephone Encounter (Signed)
Refill request

## 2010-11-02 ENCOUNTER — Ambulatory Visit (INDEPENDENT_AMBULATORY_CARE_PROVIDER_SITE_OTHER): Payer: PRIVATE HEALTH INSURANCE | Admitting: Family Medicine

## 2010-11-02 ENCOUNTER — Encounter: Payer: Self-pay | Admitting: Family Medicine

## 2010-11-02 VITALS — BP 134/73 | HR 50 | Temp 98.2°F | Ht 66.5 in | Wt 172.0 lb

## 2010-11-02 DIAGNOSIS — F339 Major depressive disorder, recurrent, unspecified: Secondary | ICD-10-CM

## 2010-11-02 DIAGNOSIS — M109 Gout, unspecified: Secondary | ICD-10-CM

## 2010-11-02 DIAGNOSIS — K219 Gastro-esophageal reflux disease without esophagitis: Secondary | ICD-10-CM

## 2010-11-02 DIAGNOSIS — F17200 Nicotine dependence, unspecified, uncomplicated: Secondary | ICD-10-CM

## 2010-11-02 DIAGNOSIS — M79609 Pain in unspecified limb: Secondary | ICD-10-CM

## 2010-11-02 DIAGNOSIS — I10 Essential (primary) hypertension: Secondary | ICD-10-CM

## 2010-11-02 DIAGNOSIS — M79643 Pain in unspecified hand: Secondary | ICD-10-CM

## 2010-11-02 MED ORDER — ESOMEPRAZOLE MAGNESIUM 40 MG PO CPDR: 40.0000 mg | DELAYED_RELEASE_CAPSULE | Freq: Every day | ORAL | Status: DC

## 2010-11-02 NOTE — Patient Instructions (Addendum)
It is important to quit smoking --- 1-800-Quit-Now  Take your norvasc 60m bottle of tablet and ask pharmacy if they can idenitfy which tablets are your norvasc.  If they can't throw them away-  Have the pharmacy call me if they have any question.   Do not mix your medicine in the bottles because this leads to confusion.

## 2010-11-02 NOTE — Telephone Encounter (Signed)
This medication has been refilled with new dosage of 21m daily- see med rec.

## 2010-11-02 NOTE — Progress Notes (Signed)
  Subjective:    Patient ID: Jesse Franklin, male    DOB: 05-Apr-1951, 60 y.o.   MRN: [id number]  HPI Hand tingling- Tingling in hands bilateral, present x years, worse in cold weather and weather changes, massage makes it better.   had torn ligaments in right hand, was told he was told that he would always have problems with nerve pain and arthritis.   States he doesn't want to take any medicine, wondering if anything else he can do.  HTN: BP wnl as long as taking bp meds.  Ran out.  Asking for refills for norvasc.  Pt had mixed some of his medications (2 different types) in his norvasc bottle.  We discussed how important it is that he not do this--b/c he could get too much or too little of the different types of medicines.    Gout: Discussed transitioning pt off of colchicine if uric acid level wnl- but pt refuses to consider d/c of colchicine- he states that as soon as he stops taking it he begins to have gout flares.    Health prevention: Pt refuses flu vaccine       Review of Systems  Constitutional: Negative for chills and activity change.  Respiratory: Negative for chest tightness and shortness of breath.   Cardiovascular: Negative for chest pain.  Gastrointestinal: Negative for abdominal distention.  Musculoskeletal: Negative for myalgias and arthralgias.  Neurological: Negative for dizziness, light-headedness and headaches.  Psychiatric/Behavioral: Negative for confusion and agitation.       Objective:   Physical Exam  Constitutional: He appears well-developed and well-nourished.  HENT:  Head: Normocephalic and atraumatic.  Eyes: EOM are normal.  Cardiovascular: Normal rate and regular rhythm.   Pulmonary/Chest: Effort normal and breath sounds normal.  Abdominal: Soft. Bowel sounds are normal.  Musculoskeletal:       Right elbow: He exhibits normal range of motion, no swelling and no effusion. no tenderness found.       Left elbow: He exhibits normal range of motion,  no swelling, no effusion and no deformity. no tenderness found.       Right wrist: He exhibits decreased range of motion. He exhibits no tenderness, no swelling, no effusion, no crepitus and no deformity.       Left wrist: He exhibits normal range of motion, no tenderness, no bony tenderness, no swelling, no effusion, no crepitus and no deformity.       Arms:         Assessment & Plan:

## 2010-11-03 ENCOUNTER — Encounter: Payer: Self-pay | Admitting: Family Medicine

## 2010-11-03 DIAGNOSIS — M79643 Pain in unspecified hand: Secondary | ICD-10-CM | POA: Insufficient documentation

## 2010-11-03 NOTE — Assessment & Plan Note (Deleted)
Pt in today- on med rec found that pills were mixed in norvasc bottle.  Pt didn't know which pills were the norvasc.  Pt to take tabs to pharmacist to see if they can help identify and organize meds.  We discussed the danger of mixing tablets in this manner.  Pt states understanding.

## 2010-11-03 NOTE — Assessment & Plan Note (Signed)
Most likely 2/2 right hand surgery.  Also endorses trauma to left forearm by forklift.  Strength equal bilateral,+ sensory findings.  Normal reflexes.  Pt to monitor symptoms and return if worsen.  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  Could consider gabapentin to see if this improves symptoms but pt doesn't want to try new medicine at this time.  States, "my symptoms are not that bad yet"  Pt to monitor symptoms and return if new or worsening.

## 2010-11-03 NOTE — Assessment & Plan Note (Signed)
Counseled to quit

## 2010-11-03 NOTE — Assessment & Plan Note (Signed)
Pt in today- on med rec found that pills were mixed in norvasc bottle.  Pt didn't know which pills were the norvasc.  Pt to take tabs to pharmacist to see if they can help identify and organize meds.  We discussed the danger of mixing tablets in this manner.  Pt states understanding.

## 2010-11-03 NOTE — Assessment & Plan Note (Signed)
Continue current meds of colchicine and voltaren gel

## 2010-11-07 ENCOUNTER — Other Ambulatory Visit: Payer: Self-pay | Admitting: Family Medicine

## 2010-11-07 NOTE — Telephone Encounter (Signed)
Refill request

## 2010-11-07 NOTE — Telephone Encounter (Signed)
Please review and refill

## 2010-11-11 ENCOUNTER — Telehealth: Payer: Self-pay | Admitting: Family Medicine

## 2010-11-11 NOTE — Telephone Encounter (Signed)
Pt states that he has been calling pharm all week about meds and now is out of prevastatin & Imdur [facility]  Also, he is asking for caviness to call him about his meds- insisted on talking with doc.

## 2010-11-15 ENCOUNTER — Other Ambulatory Visit: Payer: Self-pay | Admitting: Family Medicine

## 2010-11-15 MED ORDER — PRAVASTATIN SODIUM 40 MG PO TABS: 40.0000 mg | ORAL_TABLET | Freq: Every day | ORAL | Status: DC

## 2010-11-15 MED ORDER — ISOSORBIDE MONONITRATE ER 60 MG PO TB24: 60.0000 mg | ORAL_TABLET | ORAL | Status: DC

## 2010-11-15 NOTE — Telephone Encounter (Signed)
Called pt to let him know that hx prescriptions are refilled.

## 2010-11-16 ENCOUNTER — Other Ambulatory Visit (INDEPENDENT_AMBULATORY_CARE_PROVIDER_SITE_OTHER): Payer: PRIVATE HEALTH INSURANCE

## 2010-11-16 DIAGNOSIS — I4891 Unspecified atrial fibrillation: Secondary | ICD-10-CM

## 2010-11-16 DIAGNOSIS — Z7901 Long term (current) use of anticoagulants: Secondary | ICD-10-CM

## 2010-11-21 ENCOUNTER — Other Ambulatory Visit: Payer: Self-pay | Admitting: Family Medicine

## 2010-11-21 NOTE — Telephone Encounter (Signed)
Refill request

## 2010-11-24 NOTE — Telephone Encounter (Signed)
2nd request if not already completed

## 2010-11-25 ENCOUNTER — Encounter: Payer: Self-pay | Admitting: Cardiovascular Disease

## 2010-11-25 ENCOUNTER — Ambulatory Visit (INDEPENDENT_AMBULATORY_CARE_PROVIDER_SITE_OTHER): Payer: PRIVATE HEALTH INSURANCE | Admitting: Cardiovascular Disease

## 2010-11-25 ENCOUNTER — Ambulatory Visit: Payer: Self-pay | Admitting: Cardiovascular Disease

## 2010-11-25 DIAGNOSIS — I4891 Unspecified atrial fibrillation: Secondary | ICD-10-CM

## 2010-11-25 DIAGNOSIS — I2510 Atherosclerotic heart disease of native coronary artery without angina pectoris: Secondary | ICD-10-CM

## 2010-11-25 DIAGNOSIS — D539 Nutritional anemia, unspecified: Secondary | ICD-10-CM

## 2010-11-25 NOTE — Assessment & Plan Note (Signed)
He asked me about his iron therapy. He apparently is some confusion and he thought that I was prescribing his iron for him. We will have his primary medical doctor address his anemia and his iron requirements.

## 2010-11-25 NOTE — Progress Notes (Signed)
Subjective:  Jesse Franklin is a middle-aged gentleman with a history of atrial ablation and small vessel coronary artery disease. He presents with some episodes of chest pain. These episodes are very atypical. They occur typically in his left shoulder and only last for one or 2 seconds.They're not particularly associated with exercise.   Current Outpatient Prescriptions  Medication Sig Dispense Refill  . amLODipine (NORVASC) 10 MG tablet Take 1 tablet (10 mg total) by mouth daily.  30 tablet  11  . aspirin 81 MG chewable tablet Chew 81 mg by mouth daily.        . clopidogrel (PLAVIX) 75 MG tablet Take 75 mg by mouth daily.        . colchicine 0.6 MG tablet Take 0.6 mg by mouth 2 (two) times daily.       Marland Kitchen esomeprazole (NEXIUM) 40 MG capsule Take 1 capsule (40 mg total) by mouth daily.  1 capsule  12  . ferrous sulfate 325 (65 FE) MG tablet TAKE 1 TABLET BY MOUTH BY MOUTH 3 TIMES A DAY BETWEEN MEALS  90 tablet  0  . furosemide (LASIX) 20 MG tablet Take 20 mg by mouth daily.        . isosorbide mononitrate (IMDUR) 60 MG 24 hr tablet Take 1 tablet (60 mg total) by mouth every morning.  30 tablet  11  . metoprolol (TOPROL-XL) 25 MG 24 hr tablet Take 25 mg by mouth daily.        . nitroGLYCERIN (NITROSTAT) 0.4 MG SL tablet Place 0.4 mg under the tongue as directed. Place 1 tab under tongue as directed       . pravastatin (PRAVACHOL) 40 MG tablet Take 1 tablet (40 mg total) by mouth daily.  30 tablet  6  . propranolol (INDERAL LA) 60 MG 24 hr capsule Take 60 mg by mouth daily.        . ramipril (ALTACE) 10 MG capsule 1 TABLET TWICE A DAY FOR BLOOD PRESSURE  60 capsule  6  . warfarin (COUMADIN) 5 MG tablet Take 5 mg by mouth daily.        . diclofenac sodium (VOLTAREN) 1 % GEL Apply topically. Apply to affected area 3 times a day for 5 days 60g tube       . ferrous gluconate (FERGON) 325 MG tablet Take 325 mg by mouth 2 (two) times daily.          Allergies  Allergen Reactions  . Ketorolac Tromethamine       REACTION: hives    Patient Active Problem List  Diagnoses  . HYPERCHOLESTEROLEMIA  . GOUT  . UNSPECIFIED DEFICIENCY ANEMIA  . SCHIZOPHRENIA  . DEPRESSION, MAJOR, RECURRENT  . TOBACCO DEPENDENCE  . HYPERTENSION, BENIGN ESSENTIAL  . CORONARY, ARTERIOSCLEROSIS  . ATRIAL FIBRILLATION  . RHINITIS, ALLERGIC  . GASTROESOPHAGEAL REFLUX, NO ESOPHAGITIS  . CHRONIC KIDNEY DISEASE STAGE I  . BPH  . SHOULDER PAIN, LEFT  . MYALGIA  . Hand pain    History  Smoking status  . Current Everyday Smoker -- 0.3 packs/day  . Types: Cigarettes  Smokeless tobacco  . Not on file    History  Alcohol Use  . Yes    Family History  Problem Relation Age of Onset  . Alcohol abuse Father   . Cancer Sister     Review of Systems: The patient denies any heat or cold intolerance.  No weight gain or weight loss.  The patient denies headaches or blurry vision.  There is no cough or sputum production.  The patient denies dizziness.  There is no hematuria or hematochezia.  The patient denies any muscle aches or arthritis.  The patient denies any rash.  The patient denies frequent falling or instability.  There is no history of depression or anxiety.  All other systems were reviewed and are negative.  Physical Exam: The patient is alert and oriented x 3.  The mood and affect are normal.  The HEENT exam reveals that the sclera are nonicteric.  The mucous membranes are moist.  The carotids are 2+ without bruits.  There is no thyromegaly.  There is no JVD.  The lungs are clear.  The chest wall is non tender.  The heart exam reveals a regular rate with a normal S1 and S2.  There are no murmurs, gallops, or rubs.  The PMI is not displaced.   Abdominal exam reveals good bowel sounds.  There is no guarding or rebound.  There is no hepatosplenomegaly or tenderness.  There are no masses.  Exam of the legs reveal no clubbing, cyanosis, or edema.  The legs are without rashes.  The distal pulses are intact.  Cranial  nerves II - XII are intact.  Motor and sensory functions are intact.  The gait is normal.  His EKG reveals atrial fibrillation with a Junctional rhythm.  Assessment / Plan:

## 2010-11-25 NOTE — Assessment & Plan Note (Signed)
His atrial fibrillation seems to be stable. In fact he has a very regular rate and it appears that he has a junctional escape rhythm. We'll continue with the same medications.

## 2010-11-25 NOTE — Assessment & Plan Note (Addendum)
He continues to have some atypical episodes of chest pain but I do not think that these are ischemic in origin.  He has had a heart catheterization in 2005 which reveals moderate coronary artery disease. He has a tight stenosis in the very distal aspect of the left anterior descending artery. We'll continue with his current medications.

## 2010-12-14 ENCOUNTER — Ambulatory Visit (INDEPENDENT_AMBULATORY_CARE_PROVIDER_SITE_OTHER): Payer: PRIVATE HEALTH INSURANCE | Admitting: *Deleted

## 2010-12-14 DIAGNOSIS — Z7901 Long term (current) use of anticoagulants: Secondary | ICD-10-CM

## 2010-12-14 DIAGNOSIS — I4891 Unspecified atrial fibrillation: Secondary | ICD-10-CM

## 2010-12-14 LAB — POCT INR: INR: 1.1

## 2010-12-15 ENCOUNTER — Other Ambulatory Visit: Payer: Self-pay | Admitting: Family Medicine

## 2010-12-15 MED ORDER — COLCHICINE 0.6 MG PO TABS: 0.6000 mg | ORAL_TABLET | Freq: Two times a day (BID) | ORAL | Status: DC

## 2010-12-23 ENCOUNTER — Other Ambulatory Visit: Payer: Self-pay | Admitting: *Deleted

## 2010-12-23 ENCOUNTER — Ambulatory Visit (INDEPENDENT_AMBULATORY_CARE_PROVIDER_SITE_OTHER): Payer: PRIVATE HEALTH INSURANCE | Admitting: *Deleted

## 2010-12-23 DIAGNOSIS — I2510 Atherosclerotic heart disease of native coronary artery without angina pectoris: Secondary | ICD-10-CM

## 2010-12-23 DIAGNOSIS — I4891 Unspecified atrial fibrillation: Secondary | ICD-10-CM

## 2010-12-23 LAB — POCT INR: INR: 2.3

## 2010-12-23 MED ORDER — WARFARIN SODIUM 5 MG PO TABS: 5.0000 mg | ORAL_TABLET | Freq: Every day | ORAL | Status: DC

## 2010-12-23 NOTE — Telephone Encounter (Signed)
Patient request refill. Done, Corwin Levins RN

## 2011-01-06 ENCOUNTER — Ambulatory Visit (INDEPENDENT_AMBULATORY_CARE_PROVIDER_SITE_OTHER): Payer: PRIVATE HEALTH INSURANCE | Admitting: *Deleted

## 2011-01-06 DIAGNOSIS — I4891 Unspecified atrial fibrillation: Secondary | ICD-10-CM

## 2011-01-06 LAB — POCT INR: INR: 2.7

## 2011-01-20 ENCOUNTER — Ambulatory Visit (INDEPENDENT_AMBULATORY_CARE_PROVIDER_SITE_OTHER): Payer: PRIVATE HEALTH INSURANCE | Admitting: *Deleted

## 2011-01-20 ENCOUNTER — Telehealth: Payer: Self-pay | Admitting: Family Medicine

## 2011-01-20 DIAGNOSIS — I4891 Unspecified atrial fibrillation: Secondary | ICD-10-CM

## 2011-01-20 NOTE — Telephone Encounter (Signed)
Fax in Dr Luberta Mutter box dated 5/11 (today)

## 2011-01-20 NOTE — Telephone Encounter (Signed)
Pt states that he called in for refill on his Ferrous Sulfate on may 4th - pharmacy told him today that they have faxed several times for refill and has heard nothing.  [facility] [place]

## 2011-01-25 ENCOUNTER — Other Ambulatory Visit: Payer: Self-pay | Admitting: *Deleted

## 2011-01-25 MED ORDER — FUROSEMIDE 20 MG PO TABS: 20.0000 mg | ORAL_TABLET | Freq: Every day | ORAL | Status: DC

## 2011-01-25 MED ORDER — METOPROLOL SUCCINATE ER 25 MG PO TB24: 25.0000 mg | ORAL_TABLET | Freq: Every day | ORAL | Status: DC

## 2011-01-25 NOTE — Telephone Encounter (Signed)
Fax received from pharmacy. Refill completed. Corwin Levins RN

## 2011-01-27 ENCOUNTER — Other Ambulatory Visit: Payer: Self-pay | Admitting: Family Medicine

## 2011-01-27 ENCOUNTER — Ambulatory Visit (INDEPENDENT_AMBULATORY_CARE_PROVIDER_SITE_OTHER): Payer: PRIVATE HEALTH INSURANCE | Admitting: *Deleted

## 2011-01-27 DIAGNOSIS — I4891 Unspecified atrial fibrillation: Secondary | ICD-10-CM

## 2011-01-27 MED ORDER — FERROUS SULFATE 325 (65 FE) MG PO TABS: 325.0000 mg | ORAL_TABLET | Freq: Two times a day (BID) | ORAL | Status: DC

## 2011-01-27 NOTE — Op Note (Signed)
NAME:  Jesse Franklin, Jesse Franklin NO.:  [id number]   MEDICAL RECORD NO.:  [id number]          PATIENT TYPE:  AMB   LOCATION:  ENDO                         FACILITY:  [facility]   PHYSICIAN:  James L. Rolla Flatten., M.D.DATE OF BIRTH:  [birth year]   DATE OF PROCEDURE:  12/28/2005  DATE OF DISCHARGE:                                 OPERATIVE REPORT   PROCEDURE:  Colonoscopy and polypectomy.   MEDICATIONS:  Fentanyl 100 mcg, Versed 10 mg IV for both procedures.   SCOPE:  Olympus pediatric adjustable colonoscope.   INDICATIONS:  Heme-positive stool in a gentleman who has been on Plavix and  Coumadin.  Coumadin has been on hold for five days.   DESCRIPTION OF PROCEDURE:  The procedure had been explained to the patient  and consent obtained.  After the endoscopy, the patient was turned around  and digital scope was inserted and advanced.  Prep was excellent and cecum  reached.  The ileocecal valve and appendiceal orifice were identified  The  scope was withdrawn, colon carefully examined.  Ascending, transverse,  descending colon seen well. No polyps or other lesions.  No diverticulosis.  In the sigmoid colon, there were multiple small, 3-5 mm sessile polyps, some  quite red and hemorrhagic.  These were removed with a snare and sucked  through the scope.  Two were cauterized with a hot biopsy forceps.  They  were all placed in a single jar.  They were all quite small.  The rectum was  otherwise free of any polyps.  The scope was withdrawn.  The patient  tolerated the procedure well.   ASSESSMENT:  1.  Rectosigmoid polyps removed.  2.  Heme-positive stool, probably due to the sigmoid polyps and the      Coumadin.   PLAN:  Will hold Coumadin for five days.  Check pathology.  Make further  recommendations at that time.  Will have the patient follow up with Dr.  Diona Browner in the [facility].           ______________________________  Joyice Faster. Rolla Flatten., M.D.     Jaynie Bream  D:  12/28/2005  T:  12/29/2005  Job:  [id number]   cc:   Eliezer Lofts, MD  Fax: [phone]

## 2011-01-27 NOTE — Discharge Summary (Signed)
NAME:  ARGUS, CARAHER NO.:  [id number]   MEDICAL RECORD NO.:  [id number]          PATIENT TYPE:  INP   LOCATION:  6203                         FACILITY:  [facility]   PHYSICIAN:  Kaylyn Lim., M.D.DATE OF BIRTH:  February 20, 1951   DATE OF ADMISSION:  08/29/2004  DATE OF DISCHARGE:  09/04/2004                                 DISCHARGE SUMMARY   DISCHARGE DIAGNOSES:  1.  Non ST elevation myocardial infarction.  2.  Pancreatitis.  3.  Hypertension.  4.  History of substance abuse.   HISTORY OF PRESENT ILLNESS:  The patient is a 60 year old African-American  male with a past medical history of substance abuse, hypertension and  dyslipidemia who presented to [facility] with chief complaint of chest  pain.  The patient was found to have non ST elevation MI with elevated  troponin.  He was admitted to telemetry for further evaluation.   HOSPITAL COURSE:  The patient was treated aggressively with anticoagulation,  aspirin and Integrilin.  With IV nitroglycerin for his pain.  He became  chest pain free with his medical treatment.  He underwent cardiac  catheterization on August 30, 2004 showing a very distal occlusion at the  tip of the LAD.  No other significant coronary disease was seen.  The  patient did have elevated LVEDP of 35 mmHg therefore he was continued on  nitroglycerin drip as well as Lasix to help with his symptoms.  The patient  continued with off and on abdominal pain.  Abdominal ultrasound was  obtained, this was followed by a CT scan of the abdomen and pelvis which  showed possible acute pancreatitis.  The patient was treated with IV  antibiotics.  Amylase and lipase were normal. Antibiotics were then stopped.  The patient's diet was advanced and he was discharged home on September 04, 2004.   DISCHARGE MEDICATIONS:  1.  Prevacid.  2.  Toprol XL 100 mg daily.  3.  Plavix 75 mg daily.  4.  Aspirin 325 mg daily.  5.  Altace 5 mg b.i.d.  6.   Lasix 20 mg daily.  7.  Imdur 60 mg daily.  8.  Zocor 20 mg daily.   The patient was instructed on tobacco cessation as well as decreasing his  alcohol intake secondary to his most recent episode of pancreatitis. He was  to followup with Dr. Verlon Setting in one week after his discharge.     TWK/MEDQ  D:  09/21/2004  T:  09/21/2004  Job:  [id number]

## 2011-01-27 NOTE — Telephone Encounter (Signed)
Refill request

## 2011-01-27 NOTE — H&P (Signed)
NAME:  TAELYN, NEMES NO.:  [id number]   MEDICAL RECORD NO.:  [id number]          PATIENT TYPE:  EMS   LOCATION:  ED                           FACILITY:  [facility]   PHYSICIAN:  Kaylyn Lim., M.D.DATE OF BIRTH:  [birth year]   DATE OF ADMISSION:  08/29/2004  DATE OF DISCHARGE:                                HISTORY & PHYSICAL   HISTORY OF PRESENT ILLNESS:  A 60 year old African-American male with past  medical history of tobacco abuse, dyslipidemia, hypertension, who presents  to the [facility] with chief complaint of anterior chest pain.  The  patient notes chest pain started approximately 9:30 a.m. this morning, felt  like a pressure in the anterior part of his chest and sternal area.  He  reports mild nausea and shortness of breath associated with his pain.  No  significant exertional component; however, mild worsening with deep breath.  He does report some improvement after he was given nitroglycerin.  Also  notes tenderness to touch in the sternal area, primarily around the third  and fourth rib spaces.  No recent trauma to the area.  He reports he has  been doing his normal activities without any significant changes.  No  exertional chest pain prior to today.  He does report he has recently  started on a stomach pill and believes this to be Prevacid secondary to  indigestion-type symptoms.  Denies any orthopnea, PND, lower extremity  edema, and no syncope or presyncopal problems.  No bleeding or easy bruising  noted.  No lower extremity edema.  Other than off and on right lumbar and  low-back pain, review of systems are otherwise negative.   CURRENT MEDICATIONS:  Toprol-XL and Prevacid.   ALLERGIES:  None.   FAMILY HISTORY:  Positive for hypertension.   SOCIAL HISTORY:  He is engaged.  Approximately a 25 pack-year history of  tobacco.  No significant alcohol use per the patient.   PHYSICAL EXAMINATION:  VITAL SIGNS:  He is afebrile, pulse is in  the 60s,  blood pressure is 170/60.  He is saturating 95%.  GENERAL:  He is a middle-aged African-American male, alert and oriented x4,  no acute distress.  NECK:  Supple.  No lymphadenopathy, 2+ carotids, no JVD, no bruits.  LUNGS:  Clear.  HEART:  Regular with a normal S1 and S2.  He does have an S4 noted.  ABDOMEN:  Soft, nontender, nondistended.  EXTREMITIES:  Warm with 2+ pulses and no edema.   LABORATORY DATA:  Show a white count of 5.7, hemoglobin of 13.4, platelet  count of 176.  BUN and creatinine are 11 and 1.3.  Glucose is 129.  LFTs are  normal.  Coags show a PT of 12.5, INR of 0.9, and PTT of 25.  First CPK-MB  was 3.5, increasing to 6.7 on the second set; troponin I was 0.08,  increasing to 0.42 on the second set; and myoglobin was 307, increasing to  433 on second set.  ECG shows normal sinus rhythm, 65 per minute, LVH, with  T-wave inversions in I and  aVL, early repolarization noted.  Chest x-ray:  No acute disease.   IMPRESSION:  1.  Non-ST-elevation myocardial infarction with troponin of 0.42.  2.  Hypertension.  3.  History of tobacco abuse.   PLAN:  Will admit to telemetry, serial cardiac enzymes, and ECG.  Treat with  Lovenox, Integrilin, aspirin, nitroglycerin, beta blocker, and ACE  inhibitor. Check his lipids in the morning.  N.p.o. after midnight for  cardiac catheterization.  This was discussed with the patient and fiance at  length and the patient consents for invasive procedure.      TWK/MEDQ  D:  08/29/2004  T:  08/29/2004  Job:  [id number]

## 2011-01-27 NOTE — Cardiovascular Report (Signed)
NAME:  JA, OHMAN NO.:  [id number]   MEDICAL RECORD NO.:  [id number]          PATIENT TYPE:  OUT   LOCATION:  CATH                         FACILITY:  [facility]   PHYSICIAN:  Kaylyn Lim., M.D.DATE OF BIRTH:  Nov 05, 1950   DATE OF PROCEDURE:  08/30/2004  DATE OF DISCHARGE:                              CARDIAC CATHETERIZATION   PROCEDURES PERFORMED:  1.  Cardiac catheterization.  2.  Abdominal angiogram.  3.  Left ventricular angiogram.  4.  Coronary angiogram.   INDICATIONS FOR PROCEDURE:  Non ST elevation myocardial infarction.  Peak  troponin I of 28.0.   PROCEDURE:  Patient brought to the cardiac catheterization after appropriate  informed consent.  He was prepped and draped in a sterile fashion.  A 6-  French sheath was placed in the right femoral artery without difficulty.  Angiograms were then performed of the left and right systems without  difficulty.  LV angiogram was then performed followed by distal abdominal  angiogram to evaluate tortuosity in the abdominal and distal aorta.   RESULTS:  1.  Left main:  Normal.  2.  LAD:  Large vessel, tortuous in the mid and distal section with mild      luminal irregularities.  The extreme distal portion of the LAD after the      trifurcation of the distal vessel has a subtotal occlusion at the      trifurcation tip.  Vessel less than 0.5 mm at this juncture.  3.  LXC:  Codominant with mild luminal irregularities.  4.  OM1 and OM2:  Large vessels with mild luminal irregularities.  5.  RCA:  Tortuous, codominant with mild luminal irregularities, very faint      right to left filling collaterals seen at the apex.  6.  LV:  EF 50-55% with mild inferior apical hypokinesis.  LVEDP was 35      mmHg.  7.  Abdominal/distal aortogram showed mild tortuosity.  No significant      aneurysms were seen.  The distal aorta showed mild splaying to the left      at the area of the iliac bifurcation.  No significant iliac  or femoral      disease was noted.   IMPRESSION:  1.  Distal branch vessel obstructive disease at the very distal takeoff      after the bifurcation of the left anterior descending (vessel      approximately 0.5 mm).  2.  Nonobstructive left main, left anterior descending, diagonal, left      circumflex, obtuse marginal, and right coronary artery.  3.  Preserved left ventricular systolic function, ejection fraction of 50-      55% with inferior apical hypokinesis.  4.  Elevated EDP of approximately 35 mmHg.   PLAN:  1.  Will maximize medical therapy with aspirin, Plavix, and continue      Integrilin and Lovenox for 48 hours.  Due to elevated EDP will up      titrate ACE inhibitor aggressively, continue nitroglycerin drip as well      as add diuretic.  Patient  already on beta blocker with good control.  2.  To evaluate his distal aorta, will get a KUB.  Also, secondary to      chronic abdominal pain will evaluate with      KUB as well as get abdominal ultrasound to see if any significant      problems exist.  Discussed results with patient at length.  Patient      denies any recent IV drug use.  Therefore, will continue medical      treatment as listed above.       TWK/MEDQ  D:  08/30/2004  T:  08/30/2004  Job:  [id number]

## 2011-01-27 NOTE — Op Note (Signed)
NAME:  Jesse Franklin, Jesse Franklin NO.:  [id number]   MEDICAL RECORD NO.:  [id number]          PATIENT TYPE:  AMB   LOCATION:  ENDO                         FACILITY:  [facility]   PHYSICIAN:  James L. Rolla Flatten., M.D.DATE OF BIRTH:  [birth year]   DATE OF PROCEDURE:  12/28/2005  DATE OF DISCHARGE:                                 OPERATIVE REPORT   PROCEDURE PERFORMED:  Esophagogastroduodenoscopy.   ENDOSCOPIST:  Joyice Faster. Oletta Lamas, M.D.   MEDICATIONS:  Cetacaine spray.  Fentanyl 75 mcg and Versed 7 mg IV.   INDICATIONS:  Heme positive stools.   DESCRIPTION OF THE PROCEDURE:  The procedure had been explained to the  patient and consent was obtained.   With the patient in the left lateral decubitus position the Olympus scope  was inserted and advanced. The stomach was entered and the scope was passed  into the duodenum including the bulb and the second portion, which were seen  well and were unremarkable.  There was no ulceration or inflammation.  The  antrum and body of the stomach were examined in the forward and retroflexed  view, and were completely normal.  There was a 2-3 cm hiatal hernia with a  widely patent GE junction.  No esophagitis or ulcers seen.  The distal  esophagus was normal with no signs of varices.   The scope was withdrawn.   The patient tolerated the procedure well.   ASSESSMENT:  1.  Heme positive stool.  2.  Negative endoscopy.   PLAN:  We will proceed with colonoscopy at this time as planned.           ______________________________  Joyice Faster. Rolla Flatten., M.D.     Jaynie Bream  D:  12/28/2005  T:  12/29/2005  Job:  [id number]   cc:   Eliezer Lofts, MD  Fax: [phone]

## 2011-02-07 ENCOUNTER — Other Ambulatory Visit: Payer: Self-pay | Admitting: Family Medicine

## 2011-02-07 MED ORDER — ISOSORBIDE MONONITRATE ER 60 MG PO TB24: 60.0000 mg | ORAL_TABLET | ORAL | Status: DC

## 2011-02-10 ENCOUNTER — Ambulatory Visit (INDEPENDENT_AMBULATORY_CARE_PROVIDER_SITE_OTHER): Payer: PRIVATE HEALTH INSURANCE | Admitting: *Deleted

## 2011-02-10 DIAGNOSIS — I4891 Unspecified atrial fibrillation: Secondary | ICD-10-CM

## 2011-02-10 LAB — POCT INR: INR: 2.8

## 2011-03-03 ENCOUNTER — Ambulatory Visit (INDEPENDENT_AMBULATORY_CARE_PROVIDER_SITE_OTHER): Payer: PRIVATE HEALTH INSURANCE | Admitting: *Deleted

## 2011-03-03 DIAGNOSIS — I4891 Unspecified atrial fibrillation: Secondary | ICD-10-CM

## 2011-03-31 ENCOUNTER — Ambulatory Visit (INDEPENDENT_AMBULATORY_CARE_PROVIDER_SITE_OTHER): Payer: PRIVATE HEALTH INSURANCE | Admitting: *Deleted

## 2011-03-31 DIAGNOSIS — I4891 Unspecified atrial fibrillation: Secondary | ICD-10-CM

## 2011-04-13 ENCOUNTER — Encounter: Payer: Self-pay | Admitting: Family Medicine

## 2011-04-13 ENCOUNTER — Ambulatory Visit (INDEPENDENT_AMBULATORY_CARE_PROVIDER_SITE_OTHER): Payer: PRIVATE HEALTH INSURANCE | Admitting: Family Medicine

## 2011-04-13 VITALS — BP 156/83 | HR 40 | Temp 98.3°F | Ht 66.5 in | Wt 166.2 lb

## 2011-04-13 DIAGNOSIS — R69 Illness, unspecified: Secondary | ICD-10-CM

## 2011-04-13 DIAGNOSIS — A6000 Herpesviral infection of urogenital system, unspecified: Secondary | ICD-10-CM

## 2011-04-13 HISTORY — DX: Herpesviral infection of urogenital system, unspecified: A60.00

## 2011-04-13 LAB — HIV ANTIBODY (ROUTINE TESTING W REFLEX): HIV: NONREACTIVE

## 2011-04-13 MED ORDER — ACYCLOVIR 400 MG PO TABS: 400.0000 mg | ORAL_TABLET | Freq: Three times a day (TID) | ORAL | Status: AC

## 2011-04-13 NOTE — Assessment & Plan Note (Addendum)
Primary occurrence, will treat with acyclovir for 10 days three times a day, told pt of importance of protected sex, will get other labs to rule out HIV, RPR, GC, chlamydia.  Gave pt red flags to look out for.  Pt will follow up with PCP in 3 weeks will need to discuss suppression treatment and other chronic problems.

## 2011-04-13 NOTE — Progress Notes (Signed)
  Subjective:    Patient ID: Jesse Franklin, male    DOB: 1951/01/03, 60 y.o.   MRN: [id number]  HPI 60 yo male started to have sexual relations recently with a new partner, unprotected started to have pain with urination about 1 week ago and then had lesions on penis, very painful with some discharge, no fever, no chills no swelling, no discharge but still having pain with urination. Pt denies having any other sexually transmitted disease in the past.    Review of Systems See hpi otherwise all normal.  Past medical history, social, surgical and family history all reviewed.      Objective:   Physical Exam  Vitals reviewed. Constitutional: He appears well-developed and well-nourished.  Eyes: EOM are normal. Pupils are equal, round, and reactive to light.  Cardiovascular: S1 normal.  An irregularly irregular rhythm present. PMI is displaced.   Pulmonary/Chest: Effort normal and breath sounds normal.  Genitourinary:       Pt uncircumcised, does have superficial ulcer on glans and foreskin, with mild serous discharge, no penile discharge noted,  No swelling.           Assessment & Plan:  Herpes genitalia Primary occurrence, will treat with acyclovir for 10 days three times a day, told pt of importance of protected sex, will get other labs to rule out HIV, RPR, GC, chlamydia.  Gave pt red flags to look out for.  Pt will follow up with PCP in 3 weeks will need to discuss suppression treatment and other chronic problems.

## 2011-04-13 NOTE — Patient Instructions (Signed)
Nice to meet you I am giving you a medicine to take three times a day for the next 10 days and will help Can continue to use vaseline I will call you with the results Keep your appointment with Dr. Luberta Mutter

## 2011-04-14 LAB — GC/CHLAMYDIA PROBE AMP, GENITAL
Chlamydia, DNA Probe: NEGATIVE
GC Probe Amp, Genital: NEGATIVE

## 2011-04-17 ENCOUNTER — Encounter: Payer: Self-pay | Admitting: Family Medicine

## 2011-04-17 LAB — HERPES SIMPLEX VIRUS CULTURE: Organism ID, Bacteria: DETECTED

## 2011-04-28 ENCOUNTER — Ambulatory Visit (INDEPENDENT_AMBULATORY_CARE_PROVIDER_SITE_OTHER): Payer: PRIVATE HEALTH INSURANCE | Admitting: *Deleted

## 2011-04-28 DIAGNOSIS — I4891 Unspecified atrial fibrillation: Secondary | ICD-10-CM

## 2011-04-28 LAB — POCT INR: INR: 2.5

## 2011-05-02 ENCOUNTER — Encounter: Payer: Self-pay | Admitting: Family Medicine

## 2011-05-02 ENCOUNTER — Ambulatory Visit (INDEPENDENT_AMBULATORY_CARE_PROVIDER_SITE_OTHER): Payer: PRIVATE HEALTH INSURANCE | Admitting: Family Medicine

## 2011-05-02 DIAGNOSIS — D539 Nutritional anemia, unspecified: Secondary | ICD-10-CM

## 2011-05-02 DIAGNOSIS — M25569 Pain in unspecified knee: Secondary | ICD-10-CM

## 2011-05-02 DIAGNOSIS — M25562 Pain in left knee: Secondary | ICD-10-CM

## 2011-05-02 DIAGNOSIS — N181 Chronic kidney disease, stage 1: Secondary | ICD-10-CM

## 2011-05-02 DIAGNOSIS — E7800 Pure hypercholesterolemia, unspecified: Secondary | ICD-10-CM

## 2011-05-02 DIAGNOSIS — I10 Essential (primary) hypertension: Secondary | ICD-10-CM

## 2011-05-02 DIAGNOSIS — F17200 Nicotine dependence, unspecified, uncomplicated: Secondary | ICD-10-CM

## 2011-05-02 DIAGNOSIS — R42 Dizziness and giddiness: Secondary | ICD-10-CM

## 2011-05-02 DIAGNOSIS — B009 Herpesviral infection, unspecified: Secondary | ICD-10-CM

## 2011-05-02 LAB — COMPREHENSIVE METABOLIC PANEL
AST: 31 U/L (ref 0–37)
BUN: 12 mg/dL (ref 6–23)
Calcium: 9.5 mg/dL (ref 8.4–10.5)
Chloride: 106 mEq/L (ref 96–112)
Creat: 1.08 mg/dL (ref 0.50–1.35)
Total Bilirubin: 0.5 mg/dL (ref 0.3–1.2)

## 2011-05-02 LAB — CBC
HCT: 38.5 % — ABNORMAL LOW (ref 39.0–52.0)
Hemoglobin: 13 g/dL (ref 13.0–17.0)
MCHC: 33.8 g/dL (ref 30.0–36.0)
MCV: 93.4 fL (ref 78.0–100.0)
WBC: 3.8 10*3/uL — ABNORMAL LOW (ref 4.0–10.5)

## 2011-05-02 NOTE — Progress Notes (Signed)
  Subjective:    Patient ID: Jesse Franklin, male    DOB: December 17, 1950, 60 y.o.   MRN: [id number]  HPI Knee pain:  knees bilateral, pain sometimes when first wake up,  Also has pain in later part of the day after walking.  Rest makes it better, massage  Makes it better.  Has this everyday for 2 months. Sometimes some swelling around.  No warmth in knee.   Never had before.  Doesn't feel like gout but has had this before in his knees.   Smoking: 4 cigarettes per day.  Stress is the biggest barrier that keeps him from quitting.  Blood pressure: Elevated today 160/84.  Has taken medicines today.    Episodes: Dizzy headed, blurry vision, + diaphoresis,  felt feverish, lasted 10-15 minutes.  Has happened 1 x week for the past 3 weeks.  Felt weak all over.  + nausea.  No vomiting.  No cp. No sob.  Went away on it's own.  All 3 x was cooking in kitchen when this occurred.  Has gas stove but denies any leak. Unsure of what tiggers these events.    Review of Systems As per above    Objective:   Physical Exam  Constitutional: He is oriented to person, place, and time. He appears well-developed and well-nourished.  HENT:  Head: Normocephalic.  Cardiovascular: Normal rate, regular rhythm and normal heart sounds.   No murmur heard. Pulmonary/Chest: Effort normal. No respiratory distress.  Musculoskeletal: He exhibits no edema.       Knee exam: No joint redness. No joint edema.  Full rom, some tenderness around tibial plateu bilateral right more than left.  Sensation intact.  Strength 5/5 and equal bilateral.  Reflexes equal bilateral.  Neurological: He is alert and oriented to person, place, and time. He displays normal reflexes. No cranial nerve deficit. He exhibits normal muscle tone. Coordination normal.  Skin: No rash noted.          Assessment & Plan:

## 2011-05-02 NOTE — Patient Instructions (Signed)
Bp: Take blood pressure medication as directed everyday.  Start to walk.  At least 2 x week.  Ultimate goal is to walk daily for 20-93mnutes.    Knee pain: Go get a xray to take a better look at your knees.  [facility].   Dizzy spells: Keep track on piece of any further episodes you have and the symptoms and what brings them on. Follow up with me in 2-3 weeks.  Go to the ER: If any chest pain, if any weakness,  Or if the dizzy spells back and don't resolve quickly go to the ER.

## 2011-05-03 LAB — LDL CHOLESTEROL, DIRECT: Direct LDL: 141 mg/dL — ABNORMAL HIGH

## 2011-05-04 DIAGNOSIS — M25561 Pain in right knee: Secondary | ICD-10-CM | POA: Insufficient documentation

## 2011-05-04 NOTE — Assessment & Plan Note (Signed)
bp elevated today.  Was elevated at last exam as well.  Prior to that was well controlled.  Pt to keep bp log and return in 2 weeks for recheck and possible med adjustment.

## 2011-05-04 NOTE — Assessment & Plan Note (Signed)
Will recheck cmet today to assess creatinine.

## 2011-05-04 NOTE — Assessment & Plan Note (Signed)
Will get x rays to evaluate further.

## 2011-05-04 NOTE — Assessment & Plan Note (Signed)
Will discuss suppression therapy at next visit.

## 2011-05-04 NOTE — Assessment & Plan Note (Signed)
Encouraged smoking cessation.

## 2011-05-04 NOTE — Assessment & Plan Note (Signed)
Unsure of cause of dizziness episodes as described in hpi, pt to keep log book of symptoms and return in 2 weeks or sooner if needed.  Given red flags of when to go to ER.

## 2011-05-08 ENCOUNTER — Other Ambulatory Visit: Payer: Self-pay | Admitting: Family Medicine

## 2011-05-08 ENCOUNTER — Encounter: Payer: Self-pay | Admitting: Family Medicine

## 2011-05-08 MED ORDER — CLOPIDOGREL BISULFATE 75 MG PO TABS: 75.0000 mg | ORAL_TABLET | Freq: Every day | ORAL | Status: DC

## 2011-05-10 ENCOUNTER — Other Ambulatory Visit: Payer: Self-pay | Admitting: Family Medicine

## 2011-05-10 ENCOUNTER — Ambulatory Visit (HOSPITAL_COMMUNITY)
Admission: RE | Admit: 2011-05-10 | Discharge: 2011-05-10 | Disposition: A | Discharge status: Home / Self Care | Payer: Medicaid Other | Source: Ambulatory Visit | Attending: Family Medicine | Admitting: Family Medicine

## 2011-05-10 DIAGNOSIS — IMO0002 Reserved for concepts with insufficient information to code with codable children: Secondary | ICD-10-CM | POA: Insufficient documentation

## 2011-05-10 DIAGNOSIS — M1990 Unspecified osteoarthritis, unspecified site: Secondary | ICD-10-CM

## 2011-05-10 DIAGNOSIS — R52 Pain, unspecified: Secondary | ICD-10-CM

## 2011-05-10 DIAGNOSIS — M25562 Pain in left knee: Secondary | ICD-10-CM

## 2011-05-10 DIAGNOSIS — M25569 Pain in unspecified knee: Secondary | ICD-10-CM | POA: Insufficient documentation

## 2011-05-10 DIAGNOSIS — M25469 Effusion, unspecified knee: Secondary | ICD-10-CM | POA: Insufficient documentation

## 2011-05-10 DIAGNOSIS — M25561 Pain in right knee: Secondary | ICD-10-CM

## 2011-05-10 DIAGNOSIS — M1710 Unilateral primary osteoarthritis, unspecified knee: Secondary | ICD-10-CM | POA: Insufficient documentation

## 2011-05-12 ENCOUNTER — Telehealth: Payer: Self-pay | Admitting: Family Medicine

## 2011-05-12 NOTE — Telephone Encounter (Signed)
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  Pt states understanding. Will discuss in more detail at f/up appointment next week.

## 2011-05-16 ENCOUNTER — Ambulatory Visit: Payer: Medicaid Other | Admitting: Family Medicine

## 2011-05-19 ENCOUNTER — Ambulatory Visit (INDEPENDENT_AMBULATORY_CARE_PROVIDER_SITE_OTHER): Payer: PRIVATE HEALTH INSURANCE | Admitting: Family Medicine

## 2011-05-19 ENCOUNTER — Encounter: Payer: Self-pay | Admitting: Family Medicine

## 2011-05-19 DIAGNOSIS — I4891 Unspecified atrial fibrillation: Secondary | ICD-10-CM

## 2011-05-19 DIAGNOSIS — F17200 Nicotine dependence, unspecified, uncomplicated: Secondary | ICD-10-CM

## 2011-05-19 DIAGNOSIS — M25561 Pain in right knee: Secondary | ICD-10-CM

## 2011-05-19 DIAGNOSIS — M25562 Pain in left knee: Secondary | ICD-10-CM

## 2011-05-19 DIAGNOSIS — M25569 Pain in unspecified knee: Secondary | ICD-10-CM

## 2011-05-19 DIAGNOSIS — I10 Essential (primary) hypertension: Secondary | ICD-10-CM

## 2011-05-19 NOTE — Patient Instructions (Signed)
Arthritis: Tylenol 616m by mouth 2 x day.  Tramadol 559mpo 2 x day as needed if the tylenol is not controlling your pain.   High blood pressure: GREAT JOB!!!! LOOKS MUCH BETTER!  Smoking: Consider calling 1-800-quit-now  For your next appointment bring your medications so I can review them.   Return in 2 months for blood pressure recheck.

## 2011-05-24 ENCOUNTER — Encounter: Payer: Self-pay | Admitting: Family Medicine

## 2011-05-24 NOTE — Assessment & Plan Note (Signed)
Pt bp well controlled today at 110/70, pt compliant with meds today.

## 2011-05-24 NOTE — Assessment & Plan Note (Signed)
Encouraged smoking cessation. Smokes tobacco and marijuana.

## 2011-05-24 NOTE — Assessment & Plan Note (Signed)
On warfarin, managed by cardiology

## 2011-05-24 NOTE — Progress Notes (Signed)
  Subjective:    Patient ID: Jesse Franklin, male    DOB: Dec 31, 1950, 60 y.o.   MRN: [id number]  HPI 1) bilateral knee pain- Knees still hurting bilateral.  Left seems worse than right.  Worse in am when first awakening.  Better after moving around.  But still hurt if he does "too much" walking around.  Massage and warm showers seem to help his knees.  Recent knee x rays showed mild degenerative joint disease and effusion bilateral.   Pt states he doesn't want any injection.   2) BP- 110/70, pt states he is taking medications as directed.  Did not bring medications to today's visit.  Pt states that he will bring to next appt.   3) dizziness- Has not had any further episodes since last appointment.  Pt states he feels "back to normal" but will let me know if any changes.    Review of Systems    as per above.  No sob.  No recent gout flare.   Objective:   Physical Exam  Constitutional: He is oriented to person, place, and time. He appears well-developed and well-nourished.  Cardiovascular: Normal rate, regular rhythm and normal heart sounds.   No murmur heard. Pulmonary/Chest: Effort normal and breath sounds normal. No respiratory distress. He has no wheezes.  Abdominal: Soft.  Musculoskeletal: Normal range of motion. He exhibits no edema and no tenderness.       Knee exam bilateral: No tenderness, no redness, no edema.  Normal rom.  Some pain with full flexion.   Neurological: He is alert and oriented to person, place, and time.  Skin: No rash noted.  Psychiatric: He has a normal mood and affect. His behavior is normal.          Assessment & Plan:

## 2011-05-24 NOTE — Assessment & Plan Note (Signed)
Per 04/2011 mild degenerative knee arthritis with effusions bilateral: Tylenol 621m by mouth 2 x day.  Tramadol 588mpo 2 x day as needed if the tylenol is not controlling your pain.  Encouraged exercise.

## 2011-05-26 ENCOUNTER — Ambulatory Visit (INDEPENDENT_AMBULATORY_CARE_PROVIDER_SITE_OTHER): Payer: PRIVATE HEALTH INSURANCE | Admitting: *Deleted

## 2011-05-26 DIAGNOSIS — I4891 Unspecified atrial fibrillation: Secondary | ICD-10-CM

## 2011-06-11 ENCOUNTER — Other Ambulatory Visit: Payer: Self-pay | Admitting: Family Medicine

## 2011-06-11 NOTE — Telephone Encounter (Signed)
Refill request

## 2011-06-23 ENCOUNTER — Ambulatory Visit (INDEPENDENT_AMBULATORY_CARE_PROVIDER_SITE_OTHER): Payer: PRIVATE HEALTH INSURANCE | Admitting: *Deleted

## 2011-06-23 DIAGNOSIS — I4891 Unspecified atrial fibrillation: Secondary | ICD-10-CM

## 2011-06-23 LAB — POCT INR: INR: 3.1

## 2011-07-18 ENCOUNTER — Encounter: Payer: Self-pay | Admitting: *Deleted

## 2011-07-18 ENCOUNTER — Other Ambulatory Visit: Payer: Self-pay | Admitting: Family Medicine

## 2011-07-18 NOTE — Telephone Encounter (Signed)
Refill request

## 2011-07-20 ENCOUNTER — Ambulatory Visit (INDEPENDENT_AMBULATORY_CARE_PROVIDER_SITE_OTHER): Payer: PRIVATE HEALTH INSURANCE | Admitting: *Deleted

## 2011-07-20 ENCOUNTER — Ambulatory Visit (INDEPENDENT_AMBULATORY_CARE_PROVIDER_SITE_OTHER): Payer: PRIVATE HEALTH INSURANCE | Admitting: Cardiovascular Disease

## 2011-07-20 ENCOUNTER — Encounter: Payer: Self-pay | Admitting: Cardiovascular Disease

## 2011-07-20 VITALS — BP 126/77 | HR 50 | Ht 66.5 in | Wt 169.1 lb

## 2011-07-20 DIAGNOSIS — I4891 Unspecified atrial fibrillation: Secondary | ICD-10-CM

## 2011-07-20 DIAGNOSIS — I10 Essential (primary) hypertension: Secondary | ICD-10-CM

## 2011-07-20 DIAGNOSIS — I2510 Atherosclerotic heart disease of native coronary artery without angina pectoris: Secondary | ICD-10-CM

## 2011-07-20 LAB — POCT INR: INR: 2.5

## 2011-07-20 MED ORDER — SILDENAFIL CITRATE 50 MG PO TABS: 50.0000 mg | ORAL_TABLET | Freq: Every day | ORAL | Status: DC | PRN

## 2011-07-20 NOTE — Assessment & Plan Note (Signed)
He remains in atrial fibrillation with junctional escape rhythm. He is asymptomatic. We'll continue with his same medications.

## 2011-07-20 NOTE — Progress Notes (Signed)
Jesse Franklin Date of Birth  Apr 10, 1951 [facility] [address]    [place], [place]  [address] [phone]  Fax  [phone]  History of Present Illness:  Jesse Franklin is a 60 year old gentleman with a history of hypertension, atrial fibrillation with a junctional escape. He's done well. He denies any cardiac complaints.  He mentions that he has been having problems with ED and requests Viagra.  Current Outpatient Prescriptions on File Prior to Visit  Medication Sig Dispense Refill  . amLODipine (NORVASC) 10 MG tablet Take 1 tablet (10 mg total) by mouth daily.  30 tablet  11  . aspirin 81 MG chewable tablet Chew 81 mg by mouth daily.        . clopidogrel (PLAVIX) 75 MG tablet Take 1 tablet (75 mg total) by mouth daily.  30 tablet  6  . COLCRYS 0.6 MG tablet TAKE 1 TABLET TWICE A DAY FOR GOUT  60 tablet  5  . esomeprazole (NEXIUM) 40 MG capsule Take 1 capsule (40 mg total) by mouth daily.  1 capsule  12  . ferrous gluconate (FERGON) 325 MG tablet Take 325 mg by mouth 2 (two) times daily.        . furosemide (LASIX) 20 MG tablet Take 1 tablet (20 mg total) by mouth daily.  30 tablet  11  . isosorbide mononitrate (IMDUR) 60 MG 24 hr tablet Take 1 tablet (60 mg total) by mouth every morning.  30 tablet  11  . metoprolol succinate (TOPROL-XL) 25 MG 24 hr tablet Take 1 tablet (25 mg total) by mouth daily.  30 tablet  11  . nitroGLYCERIN (NITROSTAT) 0.4 MG SL tablet Place 0.4 mg under the tongue as directed. Place 1 tab under tongue as directed       . pravastatin (PRAVACHOL) 40 MG tablet TAKE 1 TABLET (40 MG TOTAL) BY MOUTH DAILY.  30 tablet  6  . propranolol (INDERAL LA) 60 MG 24 hr capsule Take 60 mg by mouth daily.        . ramipril (ALTACE) 10 MG capsule 1 TABLET TWICE A DAY FOR BLOOD PRESSURE  60 capsule  12  . warfarin (COUMADIN) 5 MG tablet Take 1 tablet (5 mg total) by mouth daily.  30 tablet  11  . DISCONTD: pravastatin (PRAVACHOL) 40 MG tablet TAKE 1 TABLET EVERY  DAY  30 tablet  3    Allergies  Allergen Reactions  . Ketorolac Tromethamine     REACTION: hives    Past Medical History  Diagnosis Date  . History of cardiac cath   . Anemia   . Hypertension   . Gout   . Atrial fibrillation   . GERD (gastroesophageal reflux disease)   . Hyperlipidemia   . Herpes   . RHINITIS, ALLERGIC 11/08/2006  . Herpes genitalia 04/13/2011  . CAD (coronary artery disease)     Branch Vessel  . Tobacco abuse   . Alcohol abuse   . Cocaine abuse     Hx of    Past Surgical History  Procedure Date  . Right hand surgery   . Cardiac catheterization     History  Smoking status  . Current Everyday Smoker -- 0.3 packs/day  . Types: Cigarettes  Smokeless tobacco  . Not on file    History  Alcohol Use  . Yes    Family History  Problem Relation Age of Onset  . Alcohol abuse Father   . Cancer Sister  Reviw of Systems:  Reviewed in the HPI.  All other systems are negative.  Physical Exam: BP 126/77  Pulse 50  Ht 5' 6.5" (1.689 m)  Wt 169 lb 1.9 oz (76.712 kg)  BMI 26.89 kg/m2 The patient is alert and oriented x 3.  The mood and affect are normal.   Skin: warm and dry.  Color is normal.    HEENT:   [place]/AT, no JVD, normal carotids  Lungs: clear   Heart: RR, brady    Abdomen: non tender, + BS  Extremities:  No c/c/e  Neuro:  Non focal    ECG: A-Fib with slow junctional response  Assessment / Plan:

## 2011-07-20 NOTE — Assessment & Plan Note (Addendum)
Jesse Franklin seems to be doing well. He's not had any episodes of chest pain. We'll continue with the same medications.

## 2011-07-20 NOTE — Patient Instructions (Signed)
Your physician wants you to follow-up in: 6 months You will receive a reminder letter in the mail two months in advance. If you don't receive a letter, please call our office to schedule the follow-up appointment.   Your physician recommends that you return for a FASTING lipid profile: 6 months  Your physician has recommended you make the following change in your medication:   1) STOP metoprolol due to slow heart rate  2) Start viagra 50 mg

## 2011-08-15 ENCOUNTER — Other Ambulatory Visit: Payer: Self-pay | Admitting: Family Medicine

## 2011-08-15 NOTE — Telephone Encounter (Signed)
Refill request

## 2011-08-24 ENCOUNTER — Ambulatory Visit (INDEPENDENT_AMBULATORY_CARE_PROVIDER_SITE_OTHER): Payer: PRIVATE HEALTH INSURANCE | Admitting: *Deleted

## 2011-08-24 DIAGNOSIS — I4891 Unspecified atrial fibrillation: Secondary | ICD-10-CM

## 2011-08-25 ENCOUNTER — Ambulatory Visit (INDEPENDENT_AMBULATORY_CARE_PROVIDER_SITE_OTHER): Payer: PRIVATE HEALTH INSURANCE | Admitting: Family Medicine

## 2011-08-25 ENCOUNTER — Encounter: Payer: Self-pay | Admitting: Family Medicine

## 2011-08-25 VITALS — BP 130/80 | HR 60 | Ht 66.5 in | Wt 171.6 lb

## 2011-08-25 DIAGNOSIS — A6000 Herpesviral infection of urogenital system, unspecified: Secondary | ICD-10-CM

## 2011-08-25 DIAGNOSIS — I2510 Atherosclerotic heart disease of native coronary artery without angina pectoris: Secondary | ICD-10-CM

## 2011-08-25 DIAGNOSIS — N181 Chronic kidney disease, stage 1: Secondary | ICD-10-CM

## 2011-08-25 DIAGNOSIS — E7800 Pure hypercholesterolemia, unspecified: Secondary | ICD-10-CM

## 2011-08-25 DIAGNOSIS — F209 Schizophrenia, unspecified: Secondary | ICD-10-CM

## 2011-08-25 DIAGNOSIS — N529 Male erectile dysfunction, unspecified: Secondary | ICD-10-CM | POA: Insufficient documentation

## 2011-08-25 DIAGNOSIS — E785 Hyperlipidemia, unspecified: Secondary | ICD-10-CM

## 2011-08-25 DIAGNOSIS — D539 Nutritional anemia, unspecified: Secondary | ICD-10-CM

## 2011-08-25 DIAGNOSIS — F17200 Nicotine dependence, unspecified, uncomplicated: Secondary | ICD-10-CM

## 2011-08-25 DIAGNOSIS — K219 Gastro-esophageal reflux disease without esophagitis: Secondary | ICD-10-CM

## 2011-08-25 DIAGNOSIS — I4891 Unspecified atrial fibrillation: Secondary | ICD-10-CM

## 2011-08-25 DIAGNOSIS — I10 Essential (primary) hypertension: Secondary | ICD-10-CM

## 2011-08-25 DIAGNOSIS — F339 Major depressive disorder, recurrent, unspecified: Secondary | ICD-10-CM

## 2011-08-25 NOTE — Assessment & Plan Note (Signed)
Patient reports erectile dysfunction x2 years. Will contact patient cardiologist to discuss if propanolol should be changed to another medication? [place] patient needs beta blocker due to CAD. Yet, beta blockers can cause erectile dysfunction. Also, patient wanting Viagra. The patient is prescribed imdur- a long acting nitrate. We'll ask Dr. Elmarie Shiley opinion on these 2 medications for guidance on how to manage these medications.

## 2011-08-25 NOTE — Assessment & Plan Note (Signed)
Will recheck lipid panel-fasting. Since nonfasting LDL obtained in August was dramatically elevated in comparison to previous LDL levels. Patient to continue to take pravastatin 40 mg daily as directed. Patient to return for lab appointment. Will call or mail results.

## 2011-08-25 NOTE — Assessment & Plan Note (Signed)
Patient denies symptoms of depression. States he enjoys life. Denies anhedonia. Could consider doing Phq-9 at future appointment.

## 2011-08-25 NOTE — Assessment & Plan Note (Deleted)
Followed by Dr. Acie Fredrickson every 6-12 months.

## 2011-08-25 NOTE — Assessment & Plan Note (Signed)
Encouraged smoking cessation.

## 2011-08-25 NOTE — Assessment & Plan Note (Signed)
Will stop iron supplementation at this time. Patient's hemoglobin within normal limits.

## 2011-08-25 NOTE — Assessment & Plan Note (Signed)
Blood pressure 130/80 today. Continue current regimen.

## 2011-08-25 NOTE — Assessment & Plan Note (Signed)
Last creatinine within normal limits. Will recheck creatinine today.

## 2011-08-25 NOTE — Assessment & Plan Note (Signed)
Patient states that he was diagnosed with schizophrenia many years ago when he was using drugs. Had hallucinations-visual and auditory at that time. No longer has visual hallucinations. Occasionally will hear someone call his name and there was no one there-otherwise no auditory hallucinations. Patient states he has not been on medication for this in many years. Marijuana and tobacco are the only drugs use now.

## 2011-08-25 NOTE — Patient Instructions (Signed)
Stop smoking!!!  Please let me know if you are ready to quit.  We will set a stop date and develop a plan.  Please return for fasting blood work-  Return to see me in 3-6 months or sooner if needed

## 2011-08-25 NOTE — Progress Notes (Signed)
  Subjective:    Patient ID: Jesse Franklin, male    DOB: [birth year], 60 y.o.   MRN: [id number]  HPI Erectile dysfunction: States that he sometimes has problems getting and maintaining an erection. This has been a problem for 2-3 years.Patient reports that he spoke with cardiologist about problems with erection. Was given prescription for 5 tabs of Viagra. Patient unable to afford. Wants to know if there is a cheaper alternative.   Tobacco use: Patient uses 3-4 cigarettes per day. States he is not ready to quit. Occasional marijuana use.  Hyperlipidemia: Reviewed results from August of LDL of 141 with patient. Patient states that he is taking cholesterol medication as directed. Agrees to have lipid panel rechecked to ensure that this is not a false elevation.  Anemia: Hemoglobin now within normal limits. Her last CBC in August. Instructed patient he no longer needs to take iron supplement. Patient glad to hear that he no longer needs this.  Schizophrenia/depression: Noticed this on patient problem list. Asked patient about history of this. Patient states that he had problems with hearing things and seeing things when he was a young man and using drugs. He states he no longer has problems with this. Occasionally he will hear someone call his name and there will be no one there. Otherwise does not have auditory hallucinations. Denies visual hallucinations. States that he enjoys life. Has no symptoms of depression at this time.   Review of Systems    no chest pain. No shortness of breath. No fever. No dizziness. No weight changes. Objective:   Physical Exam  Constitutional: He is oriented to person, place, and time. He appears well-developed and well-nourished.  HENT:  Head: Normocephalic and atraumatic.  Cardiovascular: Normal rate, regular rhythm and normal heart sounds.   No murmur heard. Pulmonary/Chest: Effort normal. No respiratory distress. He has no wheezes.  Abdominal: Soft. He  exhibits no distension. There is no tenderness.  Musculoskeletal: He exhibits no edema.  Neurological: He is alert and oriented to person, place, and time.  Skin: No rash noted.  Psychiatric: He has a normal mood and affect. His behavior is normal. Judgment and thought content normal.          Assessment & Plan:

## 2011-08-28 ENCOUNTER — Other Ambulatory Visit: Payer: PRIVATE HEALTH INSURANCE

## 2011-08-28 DIAGNOSIS — E785 Hyperlipidemia, unspecified: Secondary | ICD-10-CM

## 2011-08-28 LAB — COMPREHENSIVE METABOLIC PANEL
AST: 31 U/L (ref 0–37)
Alkaline Phosphatase: 82 U/L (ref 39–117)
BUN: 12 mg/dL (ref 6–23)
Creat: 1.05 mg/dL (ref 0.50–1.35)
Potassium: 3.7 mEq/L (ref 3.5–5.3)

## 2011-08-28 LAB — LIPID PANEL
HDL: 47 mg/dL (ref >39)
LDL Cholesterol: 137 mg/dL — ABNORMAL HIGH (ref 0–99)
Total CHOL/HDL Ratio: 4.4 Ratio

## 2011-08-28 NOTE — Progress Notes (Signed)
CMP AND FLP DONE TODAY Jesse Franklin 

## 2011-08-29 ENCOUNTER — Ambulatory Visit: Payer: PRIVATE HEALTH INSURANCE | Admitting: Family Medicine

## 2011-09-28 ENCOUNTER — Ambulatory Visit (INDEPENDENT_AMBULATORY_CARE_PROVIDER_SITE_OTHER): Payer: PRIVATE HEALTH INSURANCE | Admitting: *Deleted

## 2011-09-28 DIAGNOSIS — I4891 Unspecified atrial fibrillation: Secondary | ICD-10-CM

## 2011-09-28 LAB — POCT INR: INR: 2.2

## 2011-10-11 ENCOUNTER — Other Ambulatory Visit: Payer: Self-pay | Admitting: Family Medicine

## 2011-10-11 NOTE — Telephone Encounter (Signed)
Refill request

## 2011-10-28 ENCOUNTER — Other Ambulatory Visit: Payer: Self-pay | Admitting: Family Medicine

## 2011-10-28 NOTE — Telephone Encounter (Signed)
Refill request

## 2011-10-30 NOTE — Telephone Encounter (Signed)
Refill request

## 2011-11-02 ENCOUNTER — Ambulatory Visit (INDEPENDENT_AMBULATORY_CARE_PROVIDER_SITE_OTHER): Payer: PRIVATE HEALTH INSURANCE | Admitting: *Deleted

## 2011-11-02 DIAGNOSIS — I4891 Unspecified atrial fibrillation: Secondary | ICD-10-CM

## 2011-11-02 NOTE — Progress Notes (Signed)
I can not sign

## 2011-11-04 ENCOUNTER — Other Ambulatory Visit: Payer: Self-pay | Admitting: Family Medicine

## 2011-11-04 NOTE — Telephone Encounter (Signed)
Refill request

## 2011-11-06 NOTE — Progress Notes (Signed)
I can not close

## 2011-11-08 ENCOUNTER — Encounter: Payer: Self-pay | Admitting: Family Medicine

## 2011-11-08 ENCOUNTER — Ambulatory Visit (INDEPENDENT_AMBULATORY_CARE_PROVIDER_SITE_OTHER): Payer: PRIVATE HEALTH INSURANCE | Admitting: Family Medicine

## 2011-11-08 DIAGNOSIS — E7800 Pure hypercholesterolemia, unspecified: Secondary | ICD-10-CM

## 2011-11-08 DIAGNOSIS — I10 Essential (primary) hypertension: Secondary | ICD-10-CM

## 2011-11-08 DIAGNOSIS — N529 Male erectile dysfunction, unspecified: Secondary | ICD-10-CM

## 2011-11-08 DIAGNOSIS — F17200 Nicotine dependence, unspecified, uncomplicated: Secondary | ICD-10-CM

## 2011-11-08 DIAGNOSIS — I2510 Atherosclerotic heart disease of native coronary artery without angina pectoris: Secondary | ICD-10-CM

## 2011-11-08 MED ORDER — SILDENAFIL CITRATE 100 MG PO TABS: 50.0000 mg | ORAL_TABLET | Freq: Every day | ORAL | Status: DC | PRN

## 2011-11-08 NOTE — Patient Instructions (Signed)
Erectile dysfunction: Stop the Imdur (isosorbid mononitrate).  Take viagra- 50 mg once daily 1 hour (range: 30 minutes to 4 hours) before sexual activity Do not take nitroglycerin tablets with viagra.  Blood pressure: Keep taking blood pressure medications!  Great job!  Smoking: Let me know when you are ready to quit.  Consider going to smoking cessation classes.   Cholesterol: Increase pravastatin to 49m daily (2 tabs).  Will recheck cho in 3 months (LDL).  Go to cardiology appointment as scheduled.  Return to see me in 1-2 months for f/up on erectile dysfunction.

## 2011-11-08 NOTE — Progress Notes (Signed)
  Subjective:    Patient ID: Jesse Franklin, male    DOB: [birth year], 61 y.o.   MRN: [id number]  HPI Sexual problems: Patient states he continues to have social issues. Would like to know what I found out from his cardiologist about starting a medication to help this. States that he has difficulty getting an erection every time he has sex. Many times he is not able to obtain an erection at all.patient states he does not wake up with erections in the morning time like he used to. Patient states he is very embarrassed by this issue when this has occurred with sexual partners. Very rarely achieves orgasm. Patient does continue to smoke cigarettes. No penile discharge. No urinary symptoms. No problems with urine stream.no urinary frequency. No urinary retention. No testicular pain. No penile pain.  Cholesterol: Patient here for followup to review results. LDL 137. This is more elevated than it has been in the past. Patient states that he sometimes does good with his diet and other times he eat fatty foods. Has been taking pravastatin as directed. No chest pain episodes. Has not needed to use nitroglycerin.   Blood pressure: Patient states he is taking his blood pressure medications as directed. Blood pressure today 131/73.  Cigarette smoking: Patient continues to smoke 3-4 cigarettes per day. Patient states he is not ready to quit.     Review of Systems As per above.    Objective:   Physical Exam  Constitutional: He appears well-developed.  HENT:  Head: Normocephalic and atraumatic.  Cardiovascular: Normal rate and normal heart sounds.   No murmur heard. Pulmonary/Chest: Effort normal. No respiratory distress. He has no wheezes. He has no rales.  Abdominal: Soft. He exhibits no distension. There is no tenderness. There is no rebound.  Musculoskeletal: He exhibits no edema.  Neurological: He is alert.  Skin: No rash noted.  Psychiatric: He has a normal mood and affect. His behavior is  normal.          Assessment & Plan:

## 2011-11-11 NOTE — Progress Notes (Signed)
Can not close

## 2011-11-13 ENCOUNTER — Other Ambulatory Visit: Payer: Self-pay

## 2011-11-13 DIAGNOSIS — I2510 Atherosclerotic heart disease of native coronary artery without angina pectoris: Secondary | ICD-10-CM

## 2011-11-13 MED ORDER — WARFARIN SODIUM 5 MG PO TABS: ORAL_TABLET | ORAL | Status: DC

## 2011-11-16 MED ORDER — PRAVASTATIN SODIUM 40 MG PO TABS: 80.0000 mg | ORAL_TABLET | Freq: Every day | ORAL | Status: DC

## 2011-11-16 NOTE — Assessment & Plan Note (Addendum)
Spoke with patient's cardiologist regarding this issue.  Since pt states that this issue is impacting his life will d/c imdur so that pt can do a trial of viagra.  Pt also instructed not to use this with nitroglycerin tablets.  Has not needed nitro tabs during the past year so I do not think that this will be a problem.  He has been on beta blockers in the past for rate control. Beta blockers stopped during the past few months and pt has continued to have good rate control without these.  Yet, he continues to have ED symptoms. I wrote to Dr. Acie Fredrickson concerning D/C of betablockers in the setting of His cath in 2005 that showed very minimal CAD ( occlusion of the terminal LAD). He has a hx of cocaine abuse. Dr. Acie Fredrickson didn't think that this is enough CAD to warrant continuation of a beta blocker if he does not have any other indication ( ie rate control for A-Fib). His last ECG at Dr. Elmarie Shiley office looked like A-Fib with a slow junctional escape.

## 2011-11-16 NOTE — Assessment & Plan Note (Signed)
Well controlled today. Continue current regimen.

## 2011-11-16 NOTE — Assessment & Plan Note (Signed)
LDL elevated to 137.  encouarged lifestyle modifications.  Increased pravastatin to 71m daily.  Will recheck LDL in 3-6 months.

## 2011-11-16 NOTE — Assessment & Plan Note (Signed)
Encouraged tobacco cessation.  We discussed that this can be contributing to erectile dysfunction problem.

## 2011-11-28 ENCOUNTER — Ambulatory Visit (INDEPENDENT_AMBULATORY_CARE_PROVIDER_SITE_OTHER): Payer: PRIVATE HEALTH INSURANCE | Admitting: *Deleted

## 2011-11-28 ENCOUNTER — Encounter: Payer: Self-pay | Admitting: Cardiovascular Disease

## 2011-11-28 ENCOUNTER — Ambulatory Visit (INDEPENDENT_AMBULATORY_CARE_PROVIDER_SITE_OTHER): Payer: PRIVATE HEALTH INSURANCE | Admitting: Cardiovascular Disease

## 2011-11-28 VITALS — BP 145/90 | HR 48 | Ht 66.5 in | Wt 171.4 lb

## 2011-11-28 DIAGNOSIS — I4891 Unspecified atrial fibrillation: Secondary | ICD-10-CM

## 2011-11-28 DIAGNOSIS — I2510 Atherosclerotic heart disease of native coronary artery without angina pectoris: Secondary | ICD-10-CM

## 2011-11-28 NOTE — Assessment & Plan Note (Signed)
The the overview as noted above.  He's not had any angina. He has not had to take any nitroglycerin.  I think that he is okay to continue taking his  Viagra.

## 2011-11-28 NOTE — Assessment & Plan Note (Signed)
His atrial fibrillation is fairly stable. He's asymptomatic. He's been therapeutic on his Coumadin. His rate is well controlled. He's had junctional rhythm in the past

## 2011-11-28 NOTE — Patient Instructions (Signed)
Your physician wants you to follow-up in: 1 year or sooner if needed/ just call. You will receive a reminder letter in the mail two months in advance. If you don't receive a letter, please call our office to schedule the follow-up appointment.  Your physician recommends that you return for a FASTING lipid profile: 1 year

## 2011-11-28 NOTE — Progress Notes (Signed)
Jesse Franklin Date of Birth  25-Apr-1951 [facility] [address]    [place], [place]  [address] [phone]  Fax  [phone]  Problem List: 1. Atrial fibrillation 2. Moderate coronary artery disease 3. Hyperlipidemia 4. Aortic insufficiency 5. Pulmonary hypertension with estimated PA pressure of 51 mmHg  History of Present Illness:  Jesse Franklin is a 61 year old gentleman with a history of hypertension, atrial fibrillation with a junctional escape. He's done well. He denies any cardiac complaints.  He mentions that he has been having problems with ED and requests Viagra.  His medical Dr. discontinued his isosorbide since he was taking Viagra. His Pravachol was increased.    Current Outpatient Prescriptions on File Prior to Visit  Medication Sig Dispense Refill  . amLODipine (NORVASC) 10 MG tablet TAKE 1 TABLET (10 MG TOTAL) BY MOUTH DAILY.  30 tablet  11  . aspirin 81 MG chewable tablet Chew 81 mg by mouth daily.        . clopidogrel (PLAVIX) 75 MG tablet Take 1 tablet (75 mg total) by mouth daily.  30 tablet  6  . COLCRYS 0.6 MG tablet TAKE 1 TABLET (0.6 MG TOTAL) BY MOUTH 2 (TWO) TIMES DAILY.  60 tablet  6  . NEXIUM 40 MG capsule TAKE ONE CAPSULE BY MOUTH EVERY DAY  90 capsule  4  . nitroGLYCERIN (NITROSTAT) 0.4 MG SL tablet Place 0.4 mg under the tongue as directed. Place 1 tab under tongue as directed       . pravastatin (PRAVACHOL) 40 MG tablet Take 2 tablets (80 mg total) by mouth daily.  30 tablet  6  . ramipril (ALTACE) 10 MG capsule 1 TABLET TWICE A DAY FOR BLOOD PRESSURE  60 capsule  12  . sildenafil (VIAGRA) 100 MG tablet Take 0.5-1 tablets (50-100 mg total) by mouth daily as needed for erectile dysfunction.  5 tablet  6  . warfarin (COUMADIN) 5 MG tablet Take as directed by anticoagulation clinic  40 tablet  3    Allergies  Allergen Reactions  . Ketorolac Tromethamine     REACTION: hives    Past Medical History  Diagnosis Date  . History of  cardiac cath   . Anemia   . Hypertension   . Gout   . Atrial fibrillation   . GERD (gastroesophageal reflux disease)   . Hyperlipidemia   . Herpes   . RHINITIS, ALLERGIC 11/08/2006  . Herpes genitalia 04/13/2011  . CAD (coronary artery disease)     Branch Vessel  . Tobacco abuse   . Alcohol abuse   . Cocaine abuse     Hx of  . BPH 11/08/2006    Past Surgical History  Procedure Date  . Right hand surgery   . Cardiac catheterization     History  Smoking status  . Current Everyday Smoker -- 0.3 packs/day  . Types: Cigarettes  Smokeless tobacco  . Not on file    History  Alcohol Use  . Yes    Family History  Problem Relation Age of Onset  . Alcohol abuse Father   . Cancer Sister     Reviw of Systems:  Reviewed in the HPI.  All other systems are negative.  Physical Exam: BP 145/90  Pulse 48  Ht 5' 6.5" (1.689 m)  Wt 171 lb 6.4 oz (77.747 kg)  BMI 27.25 kg/m2 The patient is alert and oriented x 3.  The mood and affect are normal.   Skin: warm and  dry.  Color is normal.    HEENT:   [place]/AT, no JVD, normal carotids  Lungs: clear   Heart: Irregularly irregular. He is a soft systolic murmur. He has a 2/6 diastolic murmur consistent with aortic insufficiency.  Abdomen: non tender, + BS  Extremities:  No c/c/e  Neuro:  Non focal    ECG:  Assessment / Plan:

## 2011-12-03 ENCOUNTER — Other Ambulatory Visit: Payer: Self-pay | Admitting: Family Medicine

## 2012-01-03 ENCOUNTER — Other Ambulatory Visit: Payer: Self-pay | Admitting: Family Medicine

## 2012-01-03 MED ORDER — PRAVASTATIN SODIUM 80 MG PO TABS: 80.0000 mg | ORAL_TABLET | Freq: Every day | ORAL | Status: DC

## 2012-01-09 ENCOUNTER — Ambulatory Visit (INDEPENDENT_AMBULATORY_CARE_PROVIDER_SITE_OTHER): Payer: PRIVATE HEALTH INSURANCE | Admitting: *Deleted

## 2012-01-09 DIAGNOSIS — I4891 Unspecified atrial fibrillation: Secondary | ICD-10-CM

## 2012-01-30 ENCOUNTER — Other Ambulatory Visit: Payer: Self-pay | Admitting: Family Medicine

## 2012-02-07 ENCOUNTER — Ambulatory Visit: Payer: PRIVATE HEALTH INSURANCE | Admitting: Family Medicine

## 2012-02-19 ENCOUNTER — Other Ambulatory Visit: Payer: Self-pay | Admitting: *Deleted

## 2012-02-19 MED ORDER — RAMIPRIL 10 MG PO CAPS: 10.0000 mg | ORAL_CAPSULE | Freq: Every day | ORAL | Status: DC

## 2012-02-20 ENCOUNTER — Ambulatory Visit (INDEPENDENT_AMBULATORY_CARE_PROVIDER_SITE_OTHER): Payer: PRIVATE HEALTH INSURANCE | Admitting: *Deleted

## 2012-02-20 ENCOUNTER — Other Ambulatory Visit: Payer: Self-pay | Admitting: Cardiovascular Disease

## 2012-02-20 DIAGNOSIS — I4891 Unspecified atrial fibrillation: Secondary | ICD-10-CM

## 2012-02-20 MED ORDER — NITROGLYCERIN 0.4 MG SL SUBL: 0.4000 mg | SUBLINGUAL_TABLET | SUBLINGUAL | Status: AC

## 2012-02-20 NOTE — Telephone Encounter (Signed)
Out of pills

## 2012-02-29 ENCOUNTER — Ambulatory Visit (INDEPENDENT_AMBULATORY_CARE_PROVIDER_SITE_OTHER): Payer: PRIVATE HEALTH INSURANCE | Admitting: Family Medicine

## 2012-02-29 ENCOUNTER — Encounter: Payer: Self-pay | Admitting: Family Medicine

## 2012-02-29 VITALS — BP 113/61 | HR 73 | Ht 66.5 in | Wt 166.0 lb

## 2012-02-29 DIAGNOSIS — F17200 Nicotine dependence, unspecified, uncomplicated: Secondary | ICD-10-CM

## 2012-02-29 DIAGNOSIS — E7800 Pure hypercholesterolemia, unspecified: Secondary | ICD-10-CM

## 2012-02-29 DIAGNOSIS — M25562 Pain in left knee: Secondary | ICD-10-CM

## 2012-02-29 DIAGNOSIS — I10 Essential (primary) hypertension: Secondary | ICD-10-CM

## 2012-02-29 DIAGNOSIS — F339 Major depressive disorder, recurrent, unspecified: Secondary | ICD-10-CM

## 2012-02-29 DIAGNOSIS — D649 Anemia, unspecified: Secondary | ICD-10-CM

## 2012-02-29 DIAGNOSIS — M25569 Pain in unspecified knee: Secondary | ICD-10-CM

## 2012-02-29 LAB — COMPREHENSIVE METABOLIC PANEL
ALT: 21 U/L (ref 0–53)
Alkaline Phosphatase: 89 U/L (ref 39–117)
Creat: 1.23 mg/dL (ref 0.50–1.35)
Sodium: 143 mEq/L (ref 135–145)
Total Bilirubin: 0.7 mg/dL (ref 0.3–1.2)
Total Protein: 7.4 g/dL (ref 6.0–8.3)

## 2012-02-29 LAB — CBC
HCT: 37.7 % — ABNORMAL LOW (ref 39.0–52.0)
MCH: 32.1 pg (ref 26.0–34.0)
MCV: 91.7 fL (ref 78.0–100.0)
Platelets: 158 10*3/uL (ref 150–400)
RBC: 4.11 MIL/uL — ABNORMAL LOW (ref 4.22–5.81)
RDW: 14.5 % (ref 11.5–15.5)

## 2012-02-29 LAB — LDL CHOLESTEROL, DIRECT: Direct LDL: 139 mg/dL — ABNORMAL HIGH

## 2012-02-29 MED ORDER — RAMIPRIL 10 MG PO CAPS: 10.0000 mg | ORAL_CAPSULE | Freq: Two times a day (BID) | ORAL | Status: DC

## 2012-02-29 MED ORDER — TRAMADOL HCL 50 MG PO TABS: 50.0000 mg | ORAL_TABLET | Freq: Three times a day (TID) | ORAL | Status: AC | PRN

## 2012-02-29 NOTE — Patient Instructions (Addendum)
Smoking: Make appointment with Dr. Valentina Lucks to discuss smoking cessation.   Exercise: Continue walking!  Keep up the great work.   Labs: I will send you the results in the mail  Hypertension: Looks great!  113/61  Leg pain: Tylenol 674m by mouth 2 x day.  Tramadol as needed.   Return in 3 months for follow up

## 2012-02-29 NOTE — Progress Notes (Signed)
  Subjective:    Patient ID: Jesse Franklin, male    DOB: [birth year], 61 y.o.   MRN: [id number]  HPI Patient here for routine followup:  Cholesterol: Patient is taking antilipid medication daily. Patient is due for cholesterol screening. Is not fasting today. Agrees to LDL. We'll also check liver enzymes.  Hypertension: Blood pressure currently 113/61. Not having any problems with medications. No dizziness. No vision changes. No syncope.  Depression screen: Positive depression screen-PHQ-9 form under nursing assessment. Patient states that he does sometimes go down and have feelings of depression. This occurs at least one time per week.-see PHQ-9 for more details regarding symptoms.   Knee pain bilateral: Patient reports that this in his legs on arising from chair or in the morning. Patient states the pain improved with just moving around. Has previous diagnosis of arthritis in knees. Not taking anything for his knee pain. No joint redness. No joint swelling.  Smoking status reviewed.   Review of Systems As per above    Objective:   Physical Exam  Constitutional: He appears well-developed and well-nourished.  HENT:  Head: Normocephalic and atraumatic.  Cardiovascular: Normal rate, regular rhythm and normal heart sounds.   No murmur heard. Pulmonary/Chest: Effort normal. No respiratory distress. He has no wheezes. He has no rales.  Abdominal: Soft. He exhibits no distension.  Musculoskeletal: He exhibits no edema.       Knee exam bilateral: Normal rom.  Normal strength. Normal sensation. No pain on palpation. + crepitus bilateral on exam.   Neurological: He is alert.  Skin: No rash noted.  Psychiatric: He has a normal mood and affect.          Assessment & Plan:

## 2012-03-01 NOTE — Assessment & Plan Note (Signed)
Encouraged smoking cessation.

## 2012-03-01 NOTE — Assessment & Plan Note (Signed)
Well controlled currently, no medications needed at this time.

## 2012-03-01 NOTE — Assessment & Plan Note (Signed)
Will draw lipid panel and liver enzymes at this time to assess current lipid control

## 2012-03-01 NOTE — Assessment & Plan Note (Signed)
Tylenol 631m by mouth 2 x day.  Tramadol 513mpo 3 x day as needed if the tylenol is not controlling your pain.  Encouraged exercise.

## 2012-03-01 NOTE — Assessment & Plan Note (Signed)
Some signs and symptoms of depression.  Some days he feels down and some problems with sleep.  Pt states he doesn't feel that this is a problem doesn't want any medication or other intervention at this time for this issue.

## 2012-03-11 ENCOUNTER — Telehealth: Payer: Self-pay | Admitting: Family Medicine

## 2012-03-11 NOTE — Telephone Encounter (Signed)
Called patient to discuss elevated LDL from lab results at last visit.  Pt did not answer.  So I left a message asking patient to please call me back and let me know what time of day is best to call and what telephone number is best.    I am calling patient in regards to lab work in particular his elevated LDL.  Pt has history of CAD so need to keep LDL to goal of 70.  Pt level currently 139.  Pt currently on max dose of pravastatin.  Need to discuss with patient the pros and cons of switching to another agent like crestor that may help lower LDL to goal.    My need to address at patients next appointment if he doesn't call back.

## 2012-03-12 NOTE — Telephone Encounter (Signed)
Pt returned call and will be home - pls call [phone]

## 2012-03-18 ENCOUNTER — Encounter: Payer: Self-pay | Admitting: Pharmacist

## 2012-03-18 ENCOUNTER — Ambulatory Visit (INDEPENDENT_AMBULATORY_CARE_PROVIDER_SITE_OTHER): Payer: PRIVATE HEALTH INSURANCE | Admitting: Family Medicine

## 2012-03-18 ENCOUNTER — Ambulatory Visit (INDEPENDENT_AMBULATORY_CARE_PROVIDER_SITE_OTHER): Payer: PRIVATE HEALTH INSURANCE | Admitting: Pharmacist

## 2012-03-18 ENCOUNTER — Encounter: Payer: Self-pay | Admitting: Family Medicine

## 2012-03-18 ENCOUNTER — Other Ambulatory Visit: Payer: Self-pay | Admitting: Family Medicine

## 2012-03-18 VITALS — BP 132/83 | HR 55 | Ht 66.5 in | Wt 167.0 lb

## 2012-03-18 DIAGNOSIS — M7022 Olecranon bursitis, left elbow: Secondary | ICD-10-CM | POA: Insufficient documentation

## 2012-03-18 DIAGNOSIS — M7020 Olecranon bursitis, unspecified elbow: Secondary | ICD-10-CM

## 2012-03-18 DIAGNOSIS — F17200 Nicotine dependence, unspecified, uncomplicated: Secondary | ICD-10-CM

## 2012-03-18 MED ORDER — ROSUVASTATIN CALCIUM 20 MG PO TABS: 20.0000 mg | ORAL_TABLET | Freq: Every day | ORAL | Status: DC

## 2012-03-18 MED ORDER — NAPROXEN 500 MG PO TABS: 500.0000 mg | ORAL_TABLET | Freq: Two times a day (BID) | ORAL | Status: DC

## 2012-03-18 NOTE — Assessment & Plan Note (Signed)
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     Initiated nicotine replacement tx which patient has ~100 pieces of gum in his possession.  Patient counseled on purpose, proper use, and potential adverse effects, including   Written information provided.  F/U Rx Clinic Visit in August  Total time in face-to-face counseling 35 minutes.

## 2012-03-18 NOTE — Progress Notes (Signed)
  Subjective:    Patient ID: Jesse Franklin, male    DOB: Dec 25, 1950, 61 y.o.   MRN: [id number]  Patient arrives in good spirits - asks to have someone look at his swollen left elbow.   HPI Age when started using tobacco on a daily basis 57. Number of Cigarettes per day 3-4 (Max 5-6).  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 Brand smoked Previously [facility]    Estimated Nicotine intake per day <9m.   Smokes first cigarette ~30 minutes after waking with Coffee.     Estimated Fagerstrom Score <5/10.   Longest time ever been tobacco free 1 year while "locked up".  Rates IMPORTANCE of quitting tobacco on 1-10 scale of 5. Rates READINESS of quitting tobacco on 1-10 scale of 5. Triggers to use tobacco include; Coffee in the AM, Symptoms of anxiety during the day.   Also smokes Marijuana - One $10 bag lasts him 3-4 days.   He would prefer to quit this as it is more expensive than smoking tobacco.  He has smoked Marijuana since 2005-2006 - he likes that his relaxes him.     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     Initiated nicotine replacement tx which patient has ~100 pieces of gum in his possession.  Patient counseled on purpose, proper use, and potential adverse effects, including   Written information provided.  F/U Rx Clinic Visit in August  Total time in face-to-face counseling 35 minutes.   Deferred workup of left elbow swelling to Dr. OVernie Shanks

## 2012-03-18 NOTE — Progress Notes (Signed)
  Subjective:    Patient ID: Jesse Franklin, male    DOB: 1951-02-11, 61 y.o.   MRN: [id number]  HPI  61 year old male with a history of gout who presents with swelling in his left elbow. It started two days ago. It is a growing soft mass on the tip of his elbow. It is not painful, red, or warm. He denies trauma to the area and active gout flare. He notes a history of gout in his right elbow, but says it was much more painful, swollen, and hot than this current issue.   Review of Systems Negative unless stated in HPI    Objective:   Physical Exam BP 132/83  Pulse 55  Ht 5' 6.5" (1.689 m)  Wt 167 lb (75.751 kg)  BMI 26.55 kg/m2 Gen: middle aged AAM, alert, oriented x 4, non-distressed MSK: 3 cm x 2 cm soft fluctuant mass on left olecranon; not tender, erythematous or warm; normal active and passive ROM; translucent material inside the mass      Assessment & Plan:  61 year old male with olecranon bursitis.

## 2012-03-18 NOTE — Progress Notes (Signed)
Patient ID: Jesse Franklin, male   DOB: [birth year], 61 y.o.   MRN: [id number] Reviewed and agree with Dr. Graylin Shiver documentation and management.

## 2012-03-18 NOTE — Patient Instructions (Signed)
Dear Mr. Rosenzweig,   Thank you for coming to clinic today. Please read below regarding the issues that we discussed.   Swelling of the Elbow - This is called bursitis. People with gout are more likely to get this. Since you do not want an injection, then we can try to treat it with medication, ice, and protection of that joint. Please take a medication called Naproxen 2 times a day for 14 days. If you start to have stomach pain, then stop the Naproxen. If the elbow becomes more inflamed or painful, then return to the office.   Please follow up in clinic in 2 weeks . Please call earlier if you have any questions or concerns.   Sincerely,   Dr. Carron Curie Bursitis Bursitis is swelling and soreness (inflammation) of a fluid-filled sac (bursa) that covers and protects a joint. Olecranon bursitis occurs over the elbow.  CAUSES Bursitis can be caused by injury, overuse of the joint, arthritis, or infection.  SYMPTOMS   Tenderness, swelling, warmth, or redness over the elbow.   Elbow pain with movement. This is greater with bending the elbow.   Squeaking sound when the bursa is rubbed or moved.   Increasing size of the bursa without pain or discomfort.   Fever with increasing pain and swelling if the bursa becomes infected.  HOME CARE INSTRUCTIONS   Put ice on the affected area.   Put ice in a plastic bag.   Place a towel between your skin and the bag.   Leave the ice on for 15 to 20 minutes each hour while awake. Do this for the first 2 days.   When resting, elevate your elbow above the level of your heart. This helps reduce swelling.   Continue to put the joint through a full range of motion 4 times per day. Rest the injured joint at other times. When the pain lessens, begin normal slow movements and usual activities.   Only take over-the-counter or prescription medicines for pain, discomfort, or fever as directed by your caregiver.   Reduce your intake of milk and  related dairy products (cheese, yogurt). They may make your condition worse.  SEEK IMMEDIATE MEDICAL CARE IF:   Your pain increases even during treatment.   You have a fever.   You have heat and inflammation over the bursa and elbow.   You have a red line that goes up your arm.   You have pain with movement of your elbow.  MAKE SURE YOU:   Understand these instructions.   Will watch your condition.   Will get help right away if you are not doing well or get worse.  Document Released: 09/27/2006 Document Revised: 08/17/2011 Document Reviewed: 08/13/2007 [facility] Patient Information 2012 [place].

## 2012-03-18 NOTE — Assessment & Plan Note (Signed)
Acute on 03/16/12. Etiology could be 2/2 to gout as he has a long history. The patient is also on anticoagulation medication, so it could be bleeding. It is not painful, erythematous, and patient has normal ROM so infection very unlikely. Try conservative management with Naproxen 500 mg BID x 14 days, ice, and joint protection. Follow-up in 3 weeks.

## 2012-03-18 NOTE — Patient Instructions (Addendum)
Cut down to 1 cigarette per day by August by using nicotine gum.  Return to Pharmacist Clinic in August - make appointment by calling office in late July.

## 2012-03-18 NOTE — Telephone Encounter (Signed)
Talked with patient on 7/3.  Pt agrees to change lipid medication to crestor for better lipid control.  Will need to recheck ldl in 4-6 weeks. rx for crestor sent to pharmacy.

## 2012-03-28 ENCOUNTER — Encounter: Payer: Self-pay | Admitting: Family Medicine

## 2012-03-29 ENCOUNTER — Telehealth: Payer: Self-pay | Admitting: *Deleted

## 2012-03-29 NOTE — Telephone Encounter (Signed)
Patient returned call and was given message by Marianna Fuss russel to schedule an appointment if his elbow has not improved and if better to stop the naproxen.Busick, Kevin Fenton

## 2012-03-29 NOTE — Telephone Encounter (Signed)
Called pt. Please tell pt: If elbow feels better to stop taking the Naproxen. Jesse Franklin, Jesse Franklin

## 2012-04-01 ENCOUNTER — Other Ambulatory Visit: Payer: Self-pay | Admitting: *Deleted

## 2012-04-01 DIAGNOSIS — I2510 Atherosclerotic heart disease of native coronary artery without angina pectoris: Secondary | ICD-10-CM

## 2012-04-01 MED ORDER — WARFARIN SODIUM 5 MG PO TABS: ORAL_TABLET | ORAL | Status: DC

## 2012-04-02 ENCOUNTER — Ambulatory Visit (INDEPENDENT_AMBULATORY_CARE_PROVIDER_SITE_OTHER): Payer: PRIVATE HEALTH INSURANCE | Admitting: *Deleted

## 2012-04-02 DIAGNOSIS — I4891 Unspecified atrial fibrillation: Secondary | ICD-10-CM

## 2012-04-03 ENCOUNTER — Encounter: Payer: Self-pay | Admitting: Family Medicine

## 2012-04-03 ENCOUNTER — Ambulatory Visit (INDEPENDENT_AMBULATORY_CARE_PROVIDER_SITE_OTHER): Payer: PRIVATE HEALTH INSURANCE | Admitting: Family Medicine

## 2012-04-03 VITALS — BP 129/85 | HR 55 | Ht 66.5 in | Wt 168.0 lb

## 2012-04-03 DIAGNOSIS — M7020 Olecranon bursitis, unspecified elbow: Secondary | ICD-10-CM

## 2012-04-03 DIAGNOSIS — M7022 Olecranon bursitis, left elbow: Secondary | ICD-10-CM

## 2012-04-03 NOTE — Patient Instructions (Signed)
Please followup if your Elbow becomes tender, swollen, or you have difficulty moving it. Thank you for coming to clinic today and I hope you had a happy birthday tomorrow.  Sincerely, Dr. Maricela Bo

## 2012-04-03 NOTE — Assessment & Plan Note (Signed)
He successfully completed a course of naproxen, which did not causing resolution of the cyst. Given that it is asymptomatic to the patient, and the patient has a phobia of needles and a current INR of 3.1, there is no need for current intervention. The patient is agreement with watchful waiting. He was explained the red flag symptoms that would be concerning and require further evaluation.

## 2012-04-03 NOTE — Progress Notes (Signed)
  Subjective:    Patient ID: Jesse Franklin, male    DOB: 1951-06-13, 61 y.o.   MRN: [id number]  HPI  Jesse Franklin is a 61 year old gentleman with olecranon bursitis of the left elbow. He was initially evaluated 2 weeks ago and started on a course of naproxen. He completed the course in presents for followup today. He believes that the swelling of the left elbow is gone down minimally. He completed the course of naproxen. It is not painful or erythematous. It has not hindered him in his daily function.   Review of Systems Otherwise negative    Objective:   Physical Exam BP 129/85  Pulse 55  Ht 5' 6.5" (1.689 m)  Wt 168 lb (76.204 kg)  BMI 26.71 kg/m2 General: Middle-aged [place] American male, alert and oriented x4, nondistressed, well-appearing Musculoskeletal: 2 cm x 2 cm fluid-filled cyst of left olecranon, no tenderness or erythema, no drainage, flexion extension of the left elbow       Assessment & Plan:  61 year old woman with a history of gout who presents with persistent left olecranon bursitis.

## 2012-05-07 ENCOUNTER — Ambulatory Visit (INDEPENDENT_AMBULATORY_CARE_PROVIDER_SITE_OTHER): Payer: PRIVATE HEALTH INSURANCE

## 2012-05-07 DIAGNOSIS — I4891 Unspecified atrial fibrillation: Secondary | ICD-10-CM

## 2012-05-24 ENCOUNTER — Ambulatory Visit (INDEPENDENT_AMBULATORY_CARE_PROVIDER_SITE_OTHER): Payer: PRIVATE HEALTH INSURANCE | Admitting: Family Medicine

## 2012-05-24 ENCOUNTER — Encounter: Payer: Self-pay | Admitting: Family Medicine

## 2012-05-24 VITALS — BP 132/87 | HR 71 | Ht 66.5 in | Wt 169.0 lb

## 2012-05-24 DIAGNOSIS — M7020 Olecranon bursitis, unspecified elbow: Secondary | ICD-10-CM

## 2012-05-24 DIAGNOSIS — Z2821 Immunization not carried out because of patient refusal: Secondary | ICD-10-CM

## 2012-05-24 DIAGNOSIS — F17200 Nicotine dependence, unspecified, uncomplicated: Secondary | ICD-10-CM

## 2012-05-24 DIAGNOSIS — E7800 Pure hypercholesterolemia, unspecified: Secondary | ICD-10-CM

## 2012-05-24 DIAGNOSIS — M7022 Olecranon bursitis, left elbow: Secondary | ICD-10-CM

## 2012-05-24 DIAGNOSIS — I10 Essential (primary) hypertension: Secondary | ICD-10-CM

## 2012-05-24 NOTE — Assessment & Plan Note (Addendum)
BP well controlled today at 132/87. Pt reports no problems with medications. CBC & CMET last checked in June and were WNL. Continue current regimen (amlodipine 73m daily and ramipril 150mBID). Pt will f/u in December for continued management of chronic medical problems. Sees cardiology for management of atrial fibrillation & coumadin.

## 2012-05-24 NOTE — Progress Notes (Signed)
Patient ID: Jesse Franklin, male   DOB: 10-11-1950, 61 y.o.   MRN: [id number]  HPI: Pt is a 61 y.o. male here to meet his new doctor and discuss a knot on his left elbow.  He has been doing well. We reviewed his medical history and medications together. He denies having any problems with his medications. No chest pain, SOB, abdominal pain, lightheadedness, leg swelling, changes in bowel or bladder, headaches, or nausea. Does occasionally get bilateral leg pains which he thinks is arthritis, and has some decreased appetite sometimes. Also wakes up at night and has trouble falling back asleep. He sometimes feels depressed but it improves quickly and does not think it limits him from doing his daily activities.  Regarding the knot on his elbow, he was seen here in clinic by Dr. Maricela Bo twice for it, and was told it is olecranon bursitis. He did a course of naproxen for it, which didn't help. The knot does not bother him except for that it looks weird. He thinks he has good range of motion. He has not hit it on anything, and it is not painful.  He continues to smoke 3-4 cigarettes per day and also uses marijuana daily. He is not interested in quitting either of these. Denies hx of IVDA.  Wants me to fill out a form documenting his labwork for his insurance so he can get a gift card.  ROS: as per HPI  Exam: Gen: NAD, pleasant, cooperative Heart: irregularly irregular rhythm, no murmurs auscultated Lungs: coarse breath sounds bilaterally, no wheezes or crackles auscultated Abd: nontender to palpation Ext: no lower extremity edema. Left elbow has a ~3 cm nontender circular nodule of medium density, consistent with olecranon bursitis. No erythema or warmth.  Psych: affect full range, eye contact appropriate

## 2012-05-24 NOTE — Assessment & Plan Note (Signed)
Exam consistent with olecranon bursitis. No warmth or erythema to suggest infection. Does not require intervention at this time as pt does not experience any physical discomfort from it. Advised pt that this will likely resolve in time.

## 2012-05-24 NOTE — Assessment & Plan Note (Signed)
Encouraged cessation of tobacco and marijuana, however patient is not interested in quitting.

## 2012-05-24 NOTE — Assessment & Plan Note (Addendum)
Has not had lipid panel since December 2012, with most recent LDL checked in June 2013 elevated at 139. Pt will return for fasting lipids some time next week. In the meantime, continue Crestor 72m daily. Will adjust as needed. Pt will f/u in December for continued management of chronic medical problems. I have told him once he comes for his fasting lipid panel, I will be happy to fill out his insurance form as that is necessary information for the form.

## 2012-05-24 NOTE — Patient Instructions (Signed)
It was great to meet you today!  I think your elbow will get better on its own. Let me know if you have any more problems with it.  Come back in December for a follow up appointment. You will need to call to make this appointment.  Also come back one day next week to get your cholesterol checked, in the morning. Don't eat breakfast before you come. I will call you if we need to change your cholesterol medicine.

## 2012-05-28 ENCOUNTER — Telehealth: Payer: Self-pay | Admitting: Family Medicine

## 2012-05-28 ENCOUNTER — Encounter: Payer: Self-pay | Admitting: Family Medicine

## 2012-05-28 ENCOUNTER — Other Ambulatory Visit: Payer: PRIVATE HEALTH INSURANCE

## 2012-05-28 DIAGNOSIS — E7800 Pure hypercholesterolemia, unspecified: Secondary | ICD-10-CM

## 2012-05-28 NOTE — Telephone Encounter (Signed)
Annual Wellness Exam Reward form completed and placed in Dr. Lennie Odor box for signature.  Jesse Franklin

## 2012-05-28 NOTE — Progress Notes (Signed)
D-LDL DONE INSTEAD OF FLP DUE TO PT HAVING ONE ALREADY . [place]

## 2012-05-28 NOTE — Telephone Encounter (Signed)
Patient dropped off form to be filled out and mailed back when completed.

## 2012-05-29 NOTE — Telephone Encounter (Signed)
Annual Wellness Exam Reward form signed by Dr. Ardelia Mems and mailed in envelope provided.  Jesse Franklin

## 2012-06-14 ENCOUNTER — Telehealth: Payer: Self-pay | Admitting: Family Medicine

## 2012-06-14 NOTE — Telephone Encounter (Signed)
Will forward to Dr Ardelia Mems

## 2012-06-14 NOTE — Telephone Encounter (Signed)
Patient is calling for a refill on Ramipril sent to [facility] on [address].  He is completely out.

## 2012-06-16 ENCOUNTER — Other Ambulatory Visit: Payer: Self-pay | Admitting: Family Medicine

## 2012-06-17 ENCOUNTER — Other Ambulatory Visit: Payer: Self-pay | Admitting: Family Medicine

## 2012-06-17 MED ORDER — RAMIPRIL 10 MG PO CAPS: 10.0000 mg | ORAL_CAPSULE | Freq: Two times a day (BID) | ORAL | Status: DC

## 2012-06-17 NOTE — Telephone Encounter (Signed)
Called and spoke with patient after talking with his pharmacy. His bottle says to take it once a day, but we have him taking it twice a day so he ran out of pills early. I went ahead and sent an rx with two refills for his BID ramipril to the pharmacy, which should clear it up.

## 2012-06-18 ENCOUNTER — Ambulatory Visit (INDEPENDENT_AMBULATORY_CARE_PROVIDER_SITE_OTHER): Payer: PRIVATE HEALTH INSURANCE

## 2012-06-18 DIAGNOSIS — I4891 Unspecified atrial fibrillation: Secondary | ICD-10-CM

## 2012-07-16 ENCOUNTER — Other Ambulatory Visit: Payer: Self-pay | Admitting: Family Medicine

## 2012-07-30 ENCOUNTER — Ambulatory Visit (INDEPENDENT_AMBULATORY_CARE_PROVIDER_SITE_OTHER): Payer: PRIVATE HEALTH INSURANCE | Admitting: Pharmacist

## 2012-07-30 DIAGNOSIS — I4891 Unspecified atrial fibrillation: Secondary | ICD-10-CM

## 2012-07-30 LAB — POCT INR: INR: 3.1

## 2012-08-06 ENCOUNTER — Other Ambulatory Visit: Payer: Self-pay | Admitting: *Deleted

## 2012-08-06 DIAGNOSIS — I2510 Atherosclerotic heart disease of native coronary artery without angina pectoris: Secondary | ICD-10-CM

## 2012-08-06 MED ORDER — WARFARIN SODIUM 5 MG PO TABS: ORAL_TABLET | ORAL | Status: DC

## 2012-08-16 ENCOUNTER — Other Ambulatory Visit: Payer: Self-pay | Admitting: Family Medicine

## 2012-09-02 ENCOUNTER — Ambulatory Visit (INDEPENDENT_AMBULATORY_CARE_PROVIDER_SITE_OTHER): Payer: PRIVATE HEALTH INSURANCE | Admitting: *Deleted

## 2012-09-02 DIAGNOSIS — I4891 Unspecified atrial fibrillation: Secondary | ICD-10-CM

## 2012-09-04 ENCOUNTER — Other Ambulatory Visit: Payer: Self-pay | Admitting: Family Medicine

## 2012-09-08 ENCOUNTER — Other Ambulatory Visit: Payer: Self-pay | Admitting: Family Medicine

## 2012-09-09 NOTE — Telephone Encounter (Signed)
Patient will need appointment for follow up, for more prescriptions.

## 2012-09-16 ENCOUNTER — Ambulatory Visit (INDEPENDENT_AMBULATORY_CARE_PROVIDER_SITE_OTHER): Payer: PRIVATE HEALTH INSURANCE | Admitting: *Deleted

## 2012-09-16 DIAGNOSIS — I4891 Unspecified atrial fibrillation: Secondary | ICD-10-CM

## 2012-10-07 ENCOUNTER — Ambulatory Visit (INDEPENDENT_AMBULATORY_CARE_PROVIDER_SITE_OTHER): Payer: PRIVATE HEALTH INSURANCE | Admitting: *Deleted

## 2012-10-07 DIAGNOSIS — I4891 Unspecified atrial fibrillation: Secondary | ICD-10-CM

## 2012-10-14 ENCOUNTER — Other Ambulatory Visit: Payer: Self-pay | Admitting: Family Medicine

## 2012-10-15 ENCOUNTER — Telehealth: Payer: Self-pay | Admitting: Family Medicine

## 2012-10-15 NOTE — Telephone Encounter (Signed)
To [facility] red team - please call pt and let him know I refilled a month's worth of his colchicine but he should schedule a visit to be seen to f/u on chronic medical problems. Thanks!

## 2012-10-17 NOTE — Telephone Encounter (Signed)
Called pt and informed. Pt agreed. Javier Glazier, [name]

## 2012-11-03 ENCOUNTER — Other Ambulatory Visit: Payer: Self-pay | Admitting: Family Medicine

## 2012-11-04 ENCOUNTER — Ambulatory Visit (INDEPENDENT_AMBULATORY_CARE_PROVIDER_SITE_OTHER): Payer: PRIVATE HEALTH INSURANCE | Admitting: Family Medicine

## 2012-11-04 ENCOUNTER — Ambulatory Visit (INDEPENDENT_AMBULATORY_CARE_PROVIDER_SITE_OTHER): Payer: PRIVATE HEALTH INSURANCE | Admitting: Pharmacist

## 2012-11-04 ENCOUNTER — Encounter: Payer: Self-pay | Admitting: Family Medicine

## 2012-11-04 VITALS — BP 138/86 | HR 68 | Ht 66.5 in | Wt 171.0 lb

## 2012-11-04 DIAGNOSIS — I2510 Atherosclerotic heart disease of native coronary artery without angina pectoris: Secondary | ICD-10-CM

## 2012-11-04 DIAGNOSIS — E7800 Pure hypercholesterolemia, unspecified: Secondary | ICD-10-CM

## 2012-11-04 DIAGNOSIS — E785 Hyperlipidemia, unspecified: Secondary | ICD-10-CM

## 2012-11-04 DIAGNOSIS — N529 Male erectile dysfunction, unspecified: Secondary | ICD-10-CM

## 2012-11-04 DIAGNOSIS — I10 Essential (primary) hypertension: Secondary | ICD-10-CM

## 2012-11-04 DIAGNOSIS — K219 Gastro-esophageal reflux disease without esophagitis: Secondary | ICD-10-CM

## 2012-11-04 DIAGNOSIS — F339 Major depressive disorder, recurrent, unspecified: Secondary | ICD-10-CM

## 2012-11-04 DIAGNOSIS — F17200 Nicotine dependence, unspecified, uncomplicated: Secondary | ICD-10-CM

## 2012-11-04 DIAGNOSIS — I4891 Unspecified atrial fibrillation: Secondary | ICD-10-CM

## 2012-11-04 LAB — POCT INR: INR: 2.3

## 2012-11-04 NOTE — Progress Notes (Signed)
CC: Jesse Franklin is a 62 y.o. male here to f/u on his chronic medical conditions.  HPI:  Hypertension: Doesn't check his BP at home. No shortness of breath.  CAD & Atrial fibrillation: He takes plavix, aspirin, and coumadin, and requests refills of these medicines. He is seen by [facility] cardiology, who do his INR checks. Reports he has a hx of a cardiac bypass. Occasionally has chest pain that tingles down his arm and only lasts for a few seconds. Has an rx for nitro and uses it when he needs it, but its been months since he last needed it. He also has viagra on his medication list but hasn't taken it recently. He says he can't afford it. I reviewed with him that these medications (nitro & viagra) should never be taken together as they can be fatal.  Mood: good mood. No AVH or SI/HI. Says many years ago he used to hear voices but does not anymore.  Tobacco & substance abuse: smokes 3-4 cigarettes per day. Smokes marijuana frequently because "it makes him relax". Drinks alcohol just on occasion, about 1-2 times per month.  GERD: currently takes nexium 35m daily. He is willing to try and come off this medicine since it is not ideal for long term use. Has not had EGD before.   ROS: See HPI  PHYSICAL EXAM: BP 138/86  Pulse 68  Ht 5' 6.5" (1.689 m)  Wt 171 lb (77.565 kg)  BMI 27.19 kg/m2 Gen: NAD, pleasant, cooperative. Smells strongly of marijuana. Heart: irregularly irregular rhythm Lungs: CTAB Ext: no lower extremity edema Neuro: nonfocal grossly, speech intact Psych: affect appropriate, denies SI/HI

## 2012-11-04 NOTE — Patient Instructions (Addendum)
It was great to see you again today!  For refills on your plavix and coumadin, please contact your cardiologist. I want to be sure that they want you to continue these medicines since they are heart medicines.  Let's try stopping the reflux medicine (esomeprazole). If you have heartburn or pain that persists when you stop it, please come back in to be seen, because you might need to be seen by a GI doctor. Eating fewer fatty foods and spicy foods, and quitting smoking can help this too. You can also call our clinic with any questions.  If and when you are ready to work on quitting smoking you can always schedule an appointment to talk about quitting strategies.  If you have any chest pain that does not go away or any shortness of breath please go to the ER immediately. Do NOT ever take nitroglycerin and viagra together as these medications can be fatal.  Schedule a lab appointment for this week so we can check your cholesterol, liver, and kidney tests. Otherwise, schedule an appointment to come back in 3 months so we can follow up on your blood pressure, or sooner if needed.  Be well, Dr. Ardelia Mems

## 2012-11-06 NOTE — Assessment & Plan Note (Signed)
Pt is followed by cardiology. He requests refills of his aspirin, plavix & coumadin today, but I advised that he should contact his cardiologist's office for refills since they are primarily managing his INR checks and pt is on these medicines for primary heart problems. Pt understands this recommendation and will contact cardiology for refills.

## 2012-11-06 NOTE — Assessment & Plan Note (Addendum)
On crestor 60m. Will check lipid panel - pt is to return for morning lab appointment so we can get a fasting panel (will also check CMET)

## 2012-11-06 NOTE — Assessment & Plan Note (Addendum)
As PPIs are not ideal for long term use, will discontinue Nexium for now. If pt has return of symptoms when not on PPI he may need GI referral for evaluation as it does not seem that he has ever had an EGD done before. Advised on dietary changes to decrease reflux symptoms, and that pt stop smoking. Pt will f/u prn.  Precepted with Dr. Lindell Noe who agrees with this plan.

## 2012-11-06 NOTE — Assessment & Plan Note (Signed)
Encouraged smoking cessation. Pt does not seem to want to stop smoking. Offered to discuss quit strategies if and when he is ready.

## 2012-11-06 NOTE — Assessment & Plan Note (Signed)
Pt is not currently on any psychiatric medicines and says his mood is good. Denies SI/HI/AVH. Seems stable now. F/u prn.

## 2012-11-06 NOTE — Assessment & Plan Note (Signed)
Reviewed with patient that he should never take viagra with nitroglycerin due to risk of fatal hypotension. He understands this recommendation. He hasn't had viagra in a while as he cannot afford it.

## 2012-11-06 NOTE — Assessment & Plan Note (Addendum)
BP at goal today. Pt does not check at home. Continue current management and check CMET (pt will return for lab appointment). F/U in 3 mos.

## 2012-11-07 ENCOUNTER — Other Ambulatory Visit: Payer: PRIVATE HEALTH INSURANCE

## 2012-11-07 DIAGNOSIS — E785 Hyperlipidemia, unspecified: Secondary | ICD-10-CM

## 2012-11-07 LAB — COMPREHENSIVE METABOLIC PANEL
ALT: 23 U/L (ref 0–53)
CO2: 24 mEq/L (ref 19–32)
Calcium: 9.7 mg/dL (ref 8.4–10.5)
Chloride: 108 mEq/L (ref 96–112)
Potassium: 3.4 mEq/L — ABNORMAL LOW (ref 3.5–5.3)
Sodium: 140 mEq/L (ref 135–145)
Total Protein: 7.6 g/dL (ref 6.0–8.3)

## 2012-11-07 LAB — LIPID PANEL
Cholesterol: 158 mg/dL (ref 0–200)
VLDL: 22 mg/dL (ref 0–40)

## 2012-11-07 NOTE — Progress Notes (Signed)
CMP AND FLP DONE TODAY Jesse Franklin 

## 2012-11-13 ENCOUNTER — Encounter: Payer: Self-pay | Admitting: Family Medicine

## 2012-11-17 ENCOUNTER — Other Ambulatory Visit: Payer: Self-pay | Admitting: Family Medicine

## 2012-11-18 ENCOUNTER — Other Ambulatory Visit: Payer: Self-pay | Admitting: Family Medicine

## 2012-11-19 ENCOUNTER — Other Ambulatory Visit: Payer: Self-pay | Admitting: Family Medicine

## 2012-11-20 ENCOUNTER — Telehealth: Payer: Self-pay | Admitting: Family Medicine

## 2012-11-20 MED ORDER — ROSUVASTATIN CALCIUM 40 MG PO TABS: 40.0000 mg | ORAL_TABLET | Freq: Every day | ORAL | Status: DC

## 2012-11-20 NOTE — Telephone Encounter (Signed)
Called patient to let him know his labs looked good, but his cholesterol could stand to be a little lower (given hx of CAD). Will send in new rx for increased dose of crestor (57m).  He did mention he had to go back on the PPI for heartburn, so I recommended he schedule an appointment to talk about his GERD. He will call and schedule this appointment.

## 2012-11-26 ENCOUNTER — Other Ambulatory Visit: Payer: Self-pay | Admitting: Family Medicine

## 2012-11-27 ENCOUNTER — Encounter: Payer: Self-pay | Admitting: *Deleted

## 2012-11-29 ENCOUNTER — Other Ambulatory Visit: Payer: Self-pay | Admitting: *Deleted

## 2012-12-02 ENCOUNTER — Encounter: Payer: Self-pay | Admitting: Cardiovascular Disease

## 2012-12-02 ENCOUNTER — Ambulatory Visit (INDEPENDENT_AMBULATORY_CARE_PROVIDER_SITE_OTHER): Payer: PRIVATE HEALTH INSURANCE | Admitting: Pharmacist

## 2012-12-02 ENCOUNTER — Ambulatory Visit (INDEPENDENT_AMBULATORY_CARE_PROVIDER_SITE_OTHER): Payer: PRIVATE HEALTH INSURANCE | Admitting: Cardiovascular Disease

## 2012-12-02 VITALS — BP 146/92 | HR 65 | Ht 66.5 in | Wt 173.6 lb

## 2012-12-02 DIAGNOSIS — I4891 Unspecified atrial fibrillation: Secondary | ICD-10-CM

## 2012-12-02 DIAGNOSIS — I2510 Atherosclerotic heart disease of native coronary artery without angina pectoris: Secondary | ICD-10-CM

## 2012-12-02 DIAGNOSIS — I10 Essential (primary) hypertension: Secondary | ICD-10-CM

## 2012-12-02 LAB — POCT INR: INR: 3.3

## 2012-12-02 NOTE — Assessment & Plan Note (Signed)
He had a cardiac catheterization by Dr. Verlon Setting in 2005.  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  He's had some occasional episodes of arm twinges but these only last for one or 2 seconds and do not sound like angina.

## 2012-12-02 NOTE — Assessment & Plan Note (Signed)
His blood pressure is mildly elevated today. He still eats fairly high salt diet. He eats prepared dinners and fried food moderate obese. I've given him information on the DASH diet.

## 2012-12-02 NOTE — Patient Instructions (Addendum)
Your physician wants you to follow-up in: 1 year with ekg  You will receive a reminder letter in the mail two months in advance. If you don't receive a letter, please call our office to schedule the follow-up appointment.   REDUCE HIGH SODIUM FOODS LIKE CANNED SOUP, GRAVY, SAUCES, READY PREPARED FOODS LIKE FROZEN FOODS; LEAN CUISINE, LASAGNA. BACON, SAUSAGE, LUNCH MEAT, FAST FOODS.Marland Kitchen Potato chips and hot dogs.   DASH Diet The DASH diet stands for "Dietary Approaches to Stop Hypertension." It is a healthy eating plan that has been shown to reduce high blood pressure (hypertension) in as little as 14 days, while also possibly providing other significant health benefits. These other health benefits include reducing the risk of breast cancer after menopause and reducing the risk of type 2 diabetes, heart disease, colon cancer, and stroke. Health benefits also include weight loss and slowing kidney failure in patients with chronic kidney disease.  DIET GUIDELINES  Limit salt (sodium). Your diet should contain less than 1500 mg of sodium daily.  Limit refined or processed carbohydrates. Your diet should include mostly whole grains. Desserts and added sugars should be used sparingly.  Include small amounts of heart-healthy fats. These types of fats include nuts, oils, and tub margarine. Limit saturated and trans fats. These fats have been shown to be harmful in the body. CHOOSING FOODS  The following food groups are based on a 2000 calorie diet. See your Registered Dietitian for individual calorie needs. Grains and Grain Products (6 to 8 servings daily)  Eat More Often: Whole-wheat bread, brown rice, whole-grain or wheat pasta, quinoa, popcorn without added fat or salt (air popped).  Eat Less Often: White bread, white pasta, white rice, cornbread. Vegetables (4 to 5 servings daily)  Eat More Often: Fresh, frozen, and canned vegetables. Vegetables may be raw, steamed, roasted, or grilled with a minimal  amount of fat.  Eat Less Often/Avoid: Creamed or fried vegetables. Vegetables in a cheese sauce. Fruit (4 to 5 servings daily)  Eat More Often: All fresh, canned (in natural juice), or frozen fruits. Dried fruits without added sugar. One hundred percent fruit juice ( cup [237 mL] daily).  Eat Less Often: Dried fruits with added sugar. Canned fruit in light or heavy syrup. [employer], Fish, and Poultry (2 servings or less daily. One serving is 3 to 4 oz [85-114 g]).  Eat More Often: Ninety percent or leaner ground beef, tenderloin, sirloin. Round cuts of beef, chicken breast, [place] breast. All fish. Grill, bake, or broil your meat. Nothing should be fried.  Eat Less Often/Avoid: Fatty cuts of meat, [place], or chicken leg, thigh, or wing. Fried cuts of meat or fish. Dairy (2 to 3 servings)  Eat More Often: Low-fat or fat-free milk, low-fat plain or light yogurt, reduced-fat or part-skim cheese.  Eat Less Often/Avoid: Milk (whole, 2%).Whole milk yogurt. Full-fat cheeses. Nuts, Seeds, and Legumes (4 to 5 servings per week)  Eat More Often: All without added salt.  Eat Less Often/Avoid: Salted nuts and seeds, canned beans with added salt. Fats and Sweets (limited)  Eat More Often: Vegetable oils, tub margarines without trans fats, sugar-free gelatin. Mayonnaise and salad dressings.  Eat Less Often/Avoid: Coconut oils, palm oils, butter, stick margarine, cream, half and half, cookies, candy, pie. FOR MORE INFORMATION The Dash Diet Eating Plan: www.dashdiet.org Document Released: 08/17/2011 Document Revised: 11/20/2011 Document Reviewed: 08/17/2011 [facility] Patient Information 2013 [place].

## 2012-12-02 NOTE — Assessment & Plan Note (Signed)
He stable from an atrial fibrillation standpoint. His rate is well controlled. He's on Coumadin and is seen in our Coumadin clinic.

## 2012-12-02 NOTE — Progress Notes (Signed)
Jesse Franklin Date of Birth  Dec 28, 1950       [facility]    [employer] [address], [address], [place], [place]  [address]   [place], [place]  [address] [phone]     [phone]   Fax  [phone]    Fax [phone]  Problem List: 1. Atrial fibrillation 2. Coronary artery disease RESULTS: 08/30/04 1. Left main: Normal.  2. LAD: Large vessel, tortuous in the mid and distal section with mild  luminal irregularities. The extreme distal portion of the LAD after the  trifurcation of the distal vessel has a subtotal occlusion at the  trifurcation tip. Vessel less than 0.5 mm at this juncture.  3. LXC: Codominant with mild luminal irregularities.  4. OM1 and OM2: Large vessels with mild luminal irregularities.  5. RCA: Tortuous, codominant with mild luminal irregularities, very faint  right to left filling collaterals seen at the apex.  6. LV: EF 50-55% with mild inferior apical hypokinesis. LVEDP was 35  mmHg.  7. Abdominal/distal aortogram showed mild tortuosity. No significant  aneurysms were seen. The distal aorta showed mild splaying to the left  at the area of the iliac bifurcation. No significant iliac or femoral  disease was noted.  3. Hypertension 4. Gout 5. Hyperlipidemia  History of Present Illness:  Jesse Franklin is a 62 yo with hx as noted above. He has occasional episodes of left arm tingling. These episodes last 1-2 seconds. They're not necessarily associated with exertion.  The tingling seems to get better with arm movement. He also has occasional episodes of shortness of breath.  He walks on a regular basis. He walks approximately 1 mile a day.  He still eats some salt and also eats fried and fast foods.  Current Outpatient Prescriptions on File Prior to Visit  Medication Sig Dispense Refill  . amLODipine (NORVASC) 10 MG tablet TAKE 1 TABLET (10 MG TOTAL) BY MOUTH DAILY.  30 tablet  3  . aspirin 81 MG chewable  tablet Chew 81 mg by mouth daily.        . clopidogrel (PLAVIX) 75 MG tablet TAKE 1 TABLET EVERY DAY  90 tablet  4  . COLCRYS 0.6 MG tablet TAKE 1 TABLET (0.6 MG TOTAL) BY MOUTH 2 (TWO) TIMES DAILY.  60 tablet  2  . NEXIUM 40 MG capsule       . nitroGLYCERIN (NITROSTAT) 0.4 MG SL tablet Place 1 tablet (0.4 mg total) under the tongue as directed. Place 1 tab under tongue as directed  25 tablet  6  . ramipril (ALTACE) 10 MG capsule TAKE 1 CAPSULE (10 MG TOTAL) BY MOUTH 2 (TWO) TIMES DAILY.  60 capsule  1  . rosuvastatin (CRESTOR) 40 MG tablet Take 1 tablet (40 mg total) by mouth daily.  30 tablet  3  . warfarin (COUMADIN) 5 MG tablet Take as directed by anticoagulation clinic  40 tablet  3  . sildenafil (VIAGRA) 100 MG tablet Take 0.5-1 tablets (50-100 mg total) by mouth daily as needed for erectile dysfunction.  5 tablet  6   No current facility-administered medications on file prior to visit.    Allergies  Allergen Reactions  . Ketorolac Tromethamine     REACTION: hives    Past Medical History  Diagnosis Date  . History of cardiac cath   . Anemia   . Hypertension   . Gout   . Atrial fibrillation   . GERD (  gastroesophageal reflux disease)   . Hyperlipidemia   . Herpes   . RHINITIS, ALLERGIC 11/08/2006  . Herpes genitalia 04/13/2011  . CAD (coronary artery disease)     [place]  . Tobacco abuse   . Alcohol abuse   . Cocaine abuse     Hx of  . BPH 11/08/2006    Past Surgical History  Procedure Laterality Date  . Right hand surgery    . Cardiac catheterization      History  Smoking status  . Current Every Day Smoker -- 0.30 packs/day  . Types: Cigarettes  Smokeless tobacco  . Former Systems developer  . Types: Chew  . Quit date: 09/12/1967    Comment: Chewed Red Man Tobacco    History  Alcohol Use  . Yes    Family History  Problem Relation Age of Onset  . Alcohol abuse Father   . Cancer Sister     Reviw of Systems:  Reviewed in the HPI.  All other systems are  negative.  Physical Exam: Blood pressure 146/92, pulse 65, height 5' 6.5" (1.689 m), weight 173 lb 9.6 oz (78.744 kg). General: Well developed, well nourished, in no acute distress.  Head: Normocephalic, atraumatic, sclera non-icteric, mucus membranes are moist,   Neck: Supple. Carotids are 2 + without bruits. No JVD   Lungs: Clear   Heart: irregularly irregular  Abdomen: Soft, non-tender, non-distended with normal bowel sounds.  Msk:  Strength and tone are normal   Extremities: No clubbing or cyanosis. No edema.  Distal pedal pulses are 2+ and equal    Neuro: CN II - XII intact.  Alert and oriented X 3.   Psych:  Normal   ECG: December 02, 2012:  Atrial fib with ventricular rate of 53.  Voltage for LVH with repol changes.  Assessment / Plan:

## 2012-12-23 ENCOUNTER — Other Ambulatory Visit: Payer: Self-pay | Admitting: Family Medicine

## 2012-12-23 NOTE — Telephone Encounter (Signed)
To [facility] red team - please call pt and let him know that I received a refill request for his plavix, but I think he should contact his cardiologist for refills on that medicine. Patient and I discussed this at his last visit. Cardiology manages his coumadin and since he is on plavix for cardiac problems, it makes more sense for cardiology to supply these refills since they're managing these medicines. Thanks!

## 2012-12-23 NOTE — Telephone Encounter (Signed)
Called pharmacy left detailed message below. Jesse Franklin, [name]

## 2012-12-24 NOTE — Telephone Encounter (Signed)
Received another refill request. I called patient directly and spoke with him regarding this request, and reminded him that per our discussion at his last office visit, that I would prefer if his cardiologist would refill these medications. He has one pill left, so I told him I would send in one month's worth for him, but in the future he should contact his cardiologist for refills. He understood these instructions.  He also mentioned having a gout flare, but he thinks it is getting better. Offered for him to schedule an appointment to come in and talk about this. He will keep his regularly scheduled follow up appointment on May 2.

## 2012-12-26 ENCOUNTER — Telehealth: Payer: Self-pay | Admitting: *Deleted

## 2012-12-26 NOTE — Telephone Encounter (Signed)
Received a voicemail on the physician line from a Nurse Practitioner with [facility].  She states she was out seeing Jesse Franklin and checked his urine.  It showed 2+ protein so she was calling with abnormal findings.  Also states in reviewing Jesse Franklin medications, she wanted to make the doctor aware that patient is taking Plavix, Warfarin and ASA.  States his med list shows ASA 81 mg daily, but patient has been taking ASA 325 mg daily.  Will forward to Dr. Ardelia Mems for review.   Lauralyn Primes

## 2012-12-30 ENCOUNTER — Other Ambulatory Visit: Payer: Self-pay | Admitting: Family Medicine

## 2012-12-30 ENCOUNTER — Ambulatory Visit (INDEPENDENT_AMBULATORY_CARE_PROVIDER_SITE_OTHER): Payer: PRIVATE HEALTH INSURANCE | Admitting: *Deleted

## 2012-12-30 ENCOUNTER — Other Ambulatory Visit: Payer: Self-pay | Admitting: *Deleted

## 2012-12-30 ENCOUNTER — Encounter: Payer: Self-pay | Admitting: *Deleted

## 2012-12-30 DIAGNOSIS — I2510 Atherosclerotic heart disease of native coronary artery without angina pectoris: Secondary | ICD-10-CM

## 2012-12-30 DIAGNOSIS — I4891 Unspecified atrial fibrillation: Secondary | ICD-10-CM

## 2012-12-30 LAB — POCT INR: INR: 2.3

## 2012-12-30 MED ORDER — WARFARIN SODIUM 5 MG PO TABS: ORAL_TABLET | ORAL | Status: DC

## 2012-12-30 MED ORDER — CLOPIDOGREL BISULFATE 75 MG PO TABS: ORAL_TABLET | ORAL | Status: DC

## 2012-12-30 NOTE — Telephone Encounter (Signed)
rx sent in today for plavix

## 2012-12-31 NOTE — Telephone Encounter (Signed)
Patient has upcoming appointment next week, so I will plan to discuss his medications with him at that time. I am not sure why he is on both full dose aspirin and plavix. Likely for CAD and it is possible that he should be on low dose or no aspirin at all. Will plan to research latest data regarding antiplatelet use in CAD to see if there is any indication for full dose aspirin and plavix, prior to patient's appointment. Regarding patient's 2+ proteinuria, he is already on an ACE inhibitor, so there is nothing to do about the proteinuria at this time.  Precepted with Dr. Nori Riis who agrees with this plan.

## 2013-01-10 ENCOUNTER — Ambulatory Visit (INDEPENDENT_AMBULATORY_CARE_PROVIDER_SITE_OTHER): Payer: PRIVATE HEALTH INSURANCE | Admitting: Family Medicine

## 2013-01-10 ENCOUNTER — Encounter: Payer: Self-pay | Admitting: Family Medicine

## 2013-01-10 VITALS — BP 124/86 | HR 48 | Ht 66.5 in | Wt 168.0 lb

## 2013-01-10 DIAGNOSIS — K219 Gastro-esophageal reflux disease without esophagitis: Secondary | ICD-10-CM

## 2013-01-10 DIAGNOSIS — F17200 Nicotine dependence, unspecified, uncomplicated: Secondary | ICD-10-CM

## 2013-01-10 DIAGNOSIS — I10 Essential (primary) hypertension: Secondary | ICD-10-CM

## 2013-01-10 DIAGNOSIS — Z9229 Personal history of other drug therapy: Secondary | ICD-10-CM

## 2013-01-10 DIAGNOSIS — Z7901 Long term (current) use of anticoagulants: Secondary | ICD-10-CM

## 2013-01-10 NOTE — Patient Instructions (Signed)
It was great to see you again today!  I am referring you to a GI (stomach) doctor to be evaluated for your acid reflux. You will get a call to set up this appointment.  I'm going to get in touch with your cardiologist to ask about the aspirin dose. I will call you if we need to change the dose.  Keep working on cutting back on smoking! It's the best for your health. See the tips below.  Schedule an appointment to see me again in 3 months.  Be well, Dr. Ardelia Mems  Smoking Cessation Quitting smoking is important to your health and has many advantages. However, it is not always easy to quit since nicotine is a very addictive drug. Often times, people try 3 times or more before being able to quit. This document explains the best ways for you to prepare to quit smoking. Quitting takes hard work and a lot of effort, but you can do it. ADVANTAGES OF QUITTING SMOKING  You will live longer, feel better, and live better.  Your body will feel the impact of quitting smoking almost immediately.  Within 20 minutes, blood pressure decreases. Your pulse returns to its normal level.  After 8 hours, carbon monoxide levels in the blood return to normal. Your oxygen level increases.  After 24 hours, the chance of having a heart attack starts to decrease. Your breath, hair, and body stop smelling like smoke.  After 48 hours, damaged nerve endings begin to recover. Your sense of taste and smell improve.  After 72 hours, the body is virtually free of nicotine. Your bronchial tubes relax and breathing becomes easier.  After 2 to 12 weeks, lungs can hold more air. Exercise becomes easier and circulation improves.  The risk of having a heart attack, stroke, cancer, or lung disease is greatly reduced.  After 1 year, the risk of coronary heart disease is cut in half.  After 5 years, the risk of stroke falls to the same as a nonsmoker.  After 10 years, the risk of lung cancer is cut in half and the risk of  other cancers decreases significantly.  After 15 years, the risk of coronary heart disease drops, usually to the level of a nonsmoker.  If you are pregnant, quitting smoking will improve your chances of having a healthy baby.  The people you live with, especially any children, will be healthier.  You will have extra money to spend on things other than cigarettes. QUESTIONS TO THINK ABOUT BEFORE ATTEMPTING TO QUIT You may want to talk about your answers with your caregiver.  Why do you want to quit?  If you tried to quit in the past, what helped and what did not?  What will be the most difficult situations for you after you quit? How will you plan to handle them?  Who can help you through the tough times? Your family? Friends? A caregiver?  What pleasures do you get from smoking? What ways can you still get pleasure if you quit? Here are some questions to ask your caregiver:  How can you help me to be successful at quitting?  What medicine do you think would be best for me and how should I take it?  What should I do if I need more help?  What is smoking withdrawal like? How can I get information on withdrawal? GET READY  Set a quit date.  Change your environment by getting rid of all cigarettes, ashtrays, matches, and lighters in your home,  car, or work. Do not let people smoke in your home.  Review your past attempts to quit. Think about what worked and what did not. GET SUPPORT AND ENCOURAGEMENT You have a better chance of being successful if you have help. You can get support in many ways.  Tell your family, friends, and co-workers that you are going to quit and need their support. Ask them not to smoke around you.  Get individual, group, or telephone counseling and support. Programs are available at [employer] and health centers. Call your local health department for information about programs in your area.  Spiritual beliefs and practices may help some smokers  quit.  Download a "quit meter" on your computer to keep track of quit statistics, such as how long you have gone without smoking, cigarettes not smoked, and money saved.  Get a self-help book about quitting smoking and staying off of tobacco. [place] yourself from urges to smoke. Talk to someone, go for a walk, or occupy your time with a task.  Change your normal routine. Take a different route to work. Drink tea instead of coffee. Eat breakfast in a different place.  Reduce your stress. Take a hot bath, exercise, or read a book.  Plan something enjoyable to do every day. Reward yourself for not smoking.  Explore interactive web-based programs that specialize in helping you quit. GET MEDICINE AND USE IT CORRECTLY Medicines can help you stop smoking and decrease the urge to smoke. Combining medicine with the above behavioral methods and support can greatly increase your chances of successfully quitting smoking.  Nicotine replacement therapy helps deliver nicotine to your body without the negative effects and risks of smoking. Nicotine replacement therapy includes nicotine gum, lozenges, inhalers, nasal sprays, and skin patches. Some may be available over-the-counter and others require a prescription.  Antidepressant medicine helps people abstain from smoking, but how this works is unknown. This medicine is available by prescription.  Nicotinic receptor partial agonist medicine simulates the effect of nicotine in your brain. This medicine is available by prescription. Ask your caregiver for advice about which medicines to use and how to use them based on your health history. Your caregiver will tell you what side effects to look out for if you choose to be on a medicine or therapy. Carefully read the information on the package. Do not use any other product containing nicotine while using a nicotine replacement product.  RELAPSE OR DIFFICULT SITUATIONS Most  relapses occur within the first 3 months after quitting. Do not be discouraged if you start smoking again. Remember, most people try several times before finally quitting. You may have symptoms of withdrawal because your body is used to nicotine. You may crave cigarettes, be irritable, feel very hungry, cough often, get headaches, or have difficulty concentrating. The withdrawal symptoms are only temporary. They are strongest when you first quit, but they will go away within 10 14 days. To reduce the chances of relapse, try to:  Avoid drinking alcohol. Drinking lowers your chances of successfully quitting.  Reduce the amount of caffeine you consume. Once you quit smoking, the amount of caffeine in your body increases and can give you symptoms, such as a rapid heartbeat, sweating, and anxiety.  Avoid smokers because they can make you want to smoke.  Do not let weight gain distract you. Many smokers will gain weight when they quit, usually less than 10 pounds. Eat a healthy diet and stay active. You can  always lose the weight gained after you quit.  Find ways to improve your mood other than smoking. FOR MORE INFORMATION  www.smokefree.gov  Document Released: 08/22/2001 Document Revised: 02/27/2012 Document Reviewed: 12/07/2011 [facility] Patient Information 2013 [place].

## 2013-01-11 DIAGNOSIS — Z7901 Long term (current) use of anticoagulants: Secondary | ICD-10-CM | POA: Insufficient documentation

## 2013-01-11 DIAGNOSIS — Z9229 Personal history of other drug therapy: Secondary | ICD-10-CM | POA: Insufficient documentation

## 2013-01-11 NOTE — Assessment & Plan Note (Signed)
I am concerned about bleeding risk associated with full dose aspirin, plavix, and coumadin. As these medications are managed primarily by cardiology, I have messaged Dr. Acie Fredrickson, patient's cardiologist, in [place] to inquire about whether patient should be on aspirin 31m in addition to plavix and coumadin. Will await his response. Have informed patient that I will call him if we need to change his medications.

## 2013-01-11 NOTE — Assessment & Plan Note (Signed)
Well controlled - continue current management

## 2013-01-11 NOTE — Progress Notes (Signed)
Name: Jesse Franklin Age/Sex: 62 y.o. male  HPI:  Reflux: had stopped Nexium after our last visit but pt reports he had to restart it because his reflux symptoms returned. It now feels well controlled that he is back on the Nexium. He says he has been on this medication for many years and also reports that he has never had an endoscopy before.  Anticoagulation/antiplatelets: recently a Designer, jewellery from his insurance company visited him at home for a checkup, and discovered that he is using aspirin 321m daily in addition to his plavix and coumadin. [facility]. Pt denies having any problems with bleeding, also denies any problems with falls.  Hypertension: does not check his blood pressure at home. Says he feels well. Denies LE edema. No vision changes. No headaches. Has occasionally chest pain every now and then which goes away after a few seconds. Is followed by cardiology for his atrial fibrillation and coronary artery disease.  ROS: See HPI  [place]  Is working on cutting back on smoking! Continues to use marijuana. Uses alcohol around once a week or month, drinks two drinks at a time when he does.   PHYSICAL EXAM: BP 124/86  Pulse 48  Ht 5' 6.5" (1.689 m)  Wt 168 lb (76.204 kg)  BMI 26.71 kg/m2 Gen: NAD, pleasant, cooperative Heart: RRR Lungs: CTAB, NWOB Neuro: nonfocal grossly, speech intact Ext: no appreciable lower extremity edema Psych: pleasant affect, normal eye contact, speech normal in rate and volume. Denies SI/HI.  ASSESSMENT/PLAN:  # Health maintenance:  -had colonoscopy in 2007 - unsure if he needed 5 or 10 year follow up from this

## 2013-01-11 NOTE — Assessment & Plan Note (Addendum)
Had originally planned to refer patient to GI for possible EGD due to chronic PPI use which patient was unable to discontinue, as patient reported to me that he had never had an EGD in the past. However, after his visit I have found the report from his EGD in 2007 which was done at the same time as his colonoscopy. EGD at that time did not show any esophagitis. Will need to get in touch with Dr. Oletta Lamas, the GI physician who did the procedure, to inquire about when colonoscopy needed to be repeated as patient could not recall this information and I am unable to find any definitive documentation in epic.  Will plan to call patient next week to inform him that we won't be referring him to GI after all, and to discuss with patient the risks of chronic PPI use to be sure he is fully informed.

## 2013-01-11 NOTE — Assessment & Plan Note (Signed)
Praised his efforts to cut back on smoking cigarettes. Gave handout on tips for smoking cessation. Pt continues to smoke marijuana.

## 2013-01-14 ENCOUNTER — Telehealth: Payer: Self-pay | Admitting: Family Medicine

## 2013-01-14 ENCOUNTER — Telehealth: Payer: Self-pay | Admitting: *Deleted

## 2013-01-14 NOTE — Telephone Encounter (Signed)
[facility] returning Dr. Ardelia Mems call concerning date for Jesse Franklin's next colonoscopy should be 06/2020 (ten year list ) unless he is having problems or family hx. PLease call if any further questions. Jesse Franklin, Jesse Bloomer, RN

## 2013-01-14 NOTE — Telephone Encounter (Signed)
Called patient to inform him that his cardiologist (Dr. Acie Fredrickson) agrees that he can stop taking the aspirin. He will continue with the coumadin for atrial fib and plavix for CAD. (See message copied below with Dr. Acie Fredrickson for documentation purposes). Pt understands these instructions.  Also informed patient that I will not be referring him to GI after all, since I found a normal EGD report from 2007, and the entire reason for the referral was for chronic reflux without ever having an EGD workup. Pt understood.  ----------------------------------------------------------------------------  Messaging w/ Dr. Acie Fredrickson: Sent 5/4 @ 9:10pm  Thank you for your note. I agree with you. I do not think he necessarily needs aspirin at this time. We will continue coumadin for his atrial fib and Plavix for his CAD. OK to DC aspirin   Jesse Franklin   ----- Message -----  From: Leeanne Rio, MD Sent: 01/11/2013 4:07 PM To: Thayer Headings, MD  Subject: Medication Question   Dr. Acie Fredrickson, Jesse Franklin is my primary patient at the [facility], and I have a question for you regarding his cardiac medications. Recently a nurse practitioner from his insurance company visited his home and discovered he is taking aspirin 365m daily (we had previously documented 836mdaily), in addition to his prescribed plavix and coumadin. I am concerned about bleeding risks with all three of these medications. Did you intend for him to be on both aspirin 325 and plavix, in addition to his coumadin? Does he need both plavix and aspirin? I was thinking we could decrease the aspirin to 8165maily if he does need to be on it, but did not want to make this change without discussing it with you first. Your input would be very helpful. Thank you for working with me in taking care of this patient! BriChrisandra NettersD Family Medicine PGY-1

## 2013-01-16 ENCOUNTER — Encounter: Payer: Self-pay | Admitting: Home Health Services

## 2013-01-16 ENCOUNTER — Ambulatory Visit (INDEPENDENT_AMBULATORY_CARE_PROVIDER_SITE_OTHER): Payer: PRIVATE HEALTH INSURANCE | Admitting: Home Health Services

## 2013-01-16 VITALS — BP 130/90 | HR 48 | Temp 97.1°F | Ht 66.5 in | Wt 167.7 lb

## 2013-01-16 DIAGNOSIS — Z0000 Encounter for general adult medical examination without abnormal findings: Secondary | ICD-10-CM

## 2013-01-16 NOTE — Progress Notes (Signed)
Patient here for annual wellness visit, patient reports: Risk Factors/Conditions needing evaluation or treatment: Pt does not have any new risk factors that need evaluation. Home Safety: Pt lives by self in 2 story home.  Pt reports having smoke detectors and does not have adaptive equipment in bathroom. Other Information: Corrective lens: Pt wears reading glasses.  Does not have regular eye exams. Dentures: Pt does not have dentures.  Is missing a few teeth but does not have regular dental exams. Memory: Pt reports some memory problems.  Patient's Mini Mental Score (recorded in doc. flowsheet): 22  Pt has low literacy level and score may not accurately reflect cognitive impairment.  Pt failed hearing screening in left ear.  Reccommended hearing clinic follow up.  Pt reports having no falls in the past year.  Reports having weakness in the knees at times due to arthritis.    Annual Wellness Visit Requirements Recorded Today In  Medical, family, social history Past Medical, Family, Social History Section  Current providers Care team  Current medications Medications  Wt, BP, Ht, BMI Vital signs  Hearing assessment (welcome visit) Hearing/vision  Tobacco, alcohol, illicit drug use History  ADL Nurse Assessment  Depression Screening Nurse Assessment  Cognitive impairment Nurse Assessment  Mini Mental Status Document Flowsheet  Fall Risk Fall/Depression  Home Safety Progress Note  End of Life Planning (welcome visit) Social Documentation  Medicare preventative services Progress Note  Risk factors/conditions needing evaluation/treatment Progress Note  Personalized health advice Patient Instructions, goals, letter  Diet & Exercise Social Documentation  Emergency Contact Social Documentation  Seat Belts Social Documentation  Sun exposure/protection Social Documentation

## 2013-01-20 ENCOUNTER — Encounter: Payer: Self-pay | Admitting: Home Health Services

## 2013-01-20 NOTE — Progress Notes (Signed)
I have reviewed this visit and discussed with Suzanne Lineberry and agree with her documentation  

## 2013-01-23 ENCOUNTER — Other Ambulatory Visit: Payer: Self-pay | Admitting: Family Medicine

## 2013-01-31 ENCOUNTER — Ambulatory Visit (INDEPENDENT_AMBULATORY_CARE_PROVIDER_SITE_OTHER): Payer: PRIVATE HEALTH INSURANCE | Admitting: *Deleted

## 2013-01-31 DIAGNOSIS — I4891 Unspecified atrial fibrillation: Secondary | ICD-10-CM

## 2013-01-31 LAB — POCT INR: INR: 3.5

## 2013-02-15 ENCOUNTER — Other Ambulatory Visit: Payer: Self-pay | Admitting: Family Medicine

## 2013-02-21 ENCOUNTER — Ambulatory Visit (INDEPENDENT_AMBULATORY_CARE_PROVIDER_SITE_OTHER): Payer: PRIVATE HEALTH INSURANCE

## 2013-02-21 DIAGNOSIS — I4891 Unspecified atrial fibrillation: Secondary | ICD-10-CM

## 2013-03-14 ENCOUNTER — Other Ambulatory Visit: Payer: Self-pay | Admitting: Family Medicine

## 2013-03-17 ENCOUNTER — Other Ambulatory Visit: Payer: Self-pay | Admitting: Family Medicine

## 2013-03-21 ENCOUNTER — Ambulatory Visit (INDEPENDENT_AMBULATORY_CARE_PROVIDER_SITE_OTHER): Payer: PRIVATE HEALTH INSURANCE | Admitting: *Deleted

## 2013-03-21 DIAGNOSIS — I4891 Unspecified atrial fibrillation: Secondary | ICD-10-CM

## 2013-03-24 ENCOUNTER — Other Ambulatory Visit: Payer: Self-pay | Admitting: Family Medicine

## 2013-03-31 ENCOUNTER — Ambulatory Visit (INDEPENDENT_AMBULATORY_CARE_PROVIDER_SITE_OTHER): Payer: PRIVATE HEALTH INSURANCE | Admitting: *Deleted

## 2013-03-31 DIAGNOSIS — I4891 Unspecified atrial fibrillation: Secondary | ICD-10-CM

## 2013-04-18 ENCOUNTER — Ambulatory Visit (INDEPENDENT_AMBULATORY_CARE_PROVIDER_SITE_OTHER): Payer: PRIVATE HEALTH INSURANCE

## 2013-04-18 ENCOUNTER — Ambulatory Visit (INDEPENDENT_AMBULATORY_CARE_PROVIDER_SITE_OTHER): Payer: PRIVATE HEALTH INSURANCE | Admitting: Family Medicine

## 2013-04-18 ENCOUNTER — Encounter: Payer: Self-pay | Admitting: Family Medicine

## 2013-04-18 VITALS — BP 133/81 | HR 45 | Temp 98.2°F | Ht 66.0 in | Wt 162.0 lb

## 2013-04-18 DIAGNOSIS — F339 Major depressive disorder, recurrent, unspecified: Secondary | ICD-10-CM

## 2013-04-18 DIAGNOSIS — R197 Diarrhea, unspecified: Secondary | ICD-10-CM

## 2013-04-18 DIAGNOSIS — I4891 Unspecified atrial fibrillation: Secondary | ICD-10-CM

## 2013-04-18 DIAGNOSIS — I10 Essential (primary) hypertension: Secondary | ICD-10-CM

## 2013-04-18 LAB — CBC WITH DIFFERENTIAL/PLATELET
Basophils Absolute: 0 10*3/uL (ref 0.0–0.1)
HCT: 35.6 % — ABNORMAL LOW (ref 39.0–52.0)
Hemoglobin: 12.6 g/dL — ABNORMAL LOW (ref 13.0–17.0)
Lymphocytes Relative: 41 % (ref 12–46)
Monocytes Absolute: 0.4 10*3/uL (ref 0.1–1.0)
Monocytes Relative: 9 % (ref 3–12)
Neutro Abs: 1.9 10*3/uL (ref 1.7–7.7)
RDW: 14.5 % (ref 11.5–15.5)
WBC: 3.9 10*3/uL — ABNORMAL LOW (ref 4.0–10.5)

## 2013-04-18 LAB — POCT INR: INR: 2.3

## 2013-04-18 NOTE — Progress Notes (Signed)
Patient ID: Jesse Franklin, male   DOB: [birth year], 62 y.o.   MRN: [id number]   HPI:  Diarrhea: has been present for 2-3 weeks. Patient reports frequent bowel movements in the morning. No fevers. Hasn't seen any blood in stools but they are occasionally dark looking. He does report that he's had some night sweats. Ocacsionally has constipation. He is eating and drinking well.   HTN: Denies chest pain, SOB. Occasionally has tingling in his left arm and some chest tightness which lasts just a few seconds then goes away. This pain is not worse with walking.   Mood: reports some day she has a low mood and wants to "give out". When that happens he prays for help. Has never had a plan or intention to harm himself or others. Does have history of hallucinations, says he occasionally hears people calling his name and they aren't there. Also occasionally sees glimpses of thinks, like dots and colors.   ROS: See HPI. Endorses achy legs.  [place]: smokes 3-4 cigarettes/day and marijuana daily, says it keeps his mind calm Eats salad, has cut down on meat. Says he's not trying to lose weight.  PHYSICAL EXAM: BP 133/81  Pulse 45  Temp(Src) 98.2 F (36.8 C) (Oral)  Ht 5' 6"  (1.676 m)  Wt 162 lb (73.483 kg)  BMI 26.16 kg/m2 Gen: NAD Heart: RRR Lungs: CTAB Abd: soft, nontender to palpation, no organomegaly. Neuro: grossly nonfocal, speech intact Ext: 2+ DP pulses bilaterally Psych: affect with normal range. Well groomed. Normal eye contact. Speech normal in rate and volume. Rectal: normal rectal tone. FOBT negative.  ASSESSMENT/PLAN:  # Diarrhea: unclear etiology -FOBT negative today -check CBC with diff and TSH today -will observe for now and have pt return in 3 weeks to f/u on diarrhea. May warrant referral to GI at that time if not improved due to age and change in stool habits.   # See problem based charting for other problems

## 2013-04-18 NOTE — Patient Instructions (Signed)
It was great to see you again today!  I will call you if your test results are not normal.  Otherwise, I will send you a letter.  If you do not hear from me with in 2 weeks please call our office.     If you have any thoughts of hurting yourself or anyone else, go to the Emergency Room to stay safe. Call the Behavioral Medicine center to get in for an appointment. Their phone number is: [phone]  Come back in 3 weeks to follow up on the diarrhea.  If you have any chest pain that does not go away within 30 minutes, is accompanied by nausea, sweating, shortness of breath, or made worse by activity, go to the emergency room immediately for evaluation. Likewise, seek care if you have any shortness of breath.  Be well, Dr. Ardelia Mems

## 2013-04-19 LAB — TSH: TSH: 2.143 u[IU]/mL (ref 0.350–4.500)

## 2013-04-28 NOTE — Assessment & Plan Note (Signed)
Pt reports feeling down. No active SI/HI (no plans or intentions to harm himself or others). Given his somewhat complicated psychiatric history (?schizophrenia) I feel he would best be served by psychiatric evaluation. I have given pt the phone # for [facility] for him to call and schedule a new patient appointment. Given return precautions re: SI/HI per AVS.

## 2013-04-28 NOTE — Assessment & Plan Note (Signed)
HTN well controlled. Chest pain does not seem to be anginal as it lasts for just a short second and is not associated with exertion. Continue current antihypertensive regimen. Pt given return precautions regarding chest pain/shortness of breath per AVS.

## 2013-05-13 ENCOUNTER — Other Ambulatory Visit: Payer: Self-pay | Admitting: Family Medicine

## 2013-05-14 ENCOUNTER — Encounter: Payer: Self-pay | Admitting: Family Medicine

## 2013-05-14 ENCOUNTER — Ambulatory Visit (INDEPENDENT_AMBULATORY_CARE_PROVIDER_SITE_OTHER): Payer: PRIVATE HEALTH INSURANCE | Admitting: Family Medicine

## 2013-05-14 ENCOUNTER — Other Ambulatory Visit: Payer: Self-pay | Admitting: Family Medicine

## 2013-05-14 VITALS — BP 127/77 | HR 52 | Temp 98.1°F | Ht 66.0 in | Wt 164.0 lb

## 2013-05-14 DIAGNOSIS — D539 Nutritional anemia, unspecified: Secondary | ICD-10-CM

## 2013-05-14 NOTE — Patient Instructions (Addendum)
It was great to see you again today!  I'm glad your diarrhea is better. Complete the stool cards as directed by the nursing staff, bring them back to [place] to be run.  Come back in 3 months for routine follow up, or sooner if any problems arise or your diarrhea gets worse.  Be well, Dr. Ardelia Mems

## 2013-05-16 ENCOUNTER — Ambulatory Visit (INDEPENDENT_AMBULATORY_CARE_PROVIDER_SITE_OTHER): Payer: PRIVATE HEALTH INSURANCE

## 2013-05-16 DIAGNOSIS — I4891 Unspecified atrial fibrillation: Secondary | ICD-10-CM

## 2013-05-28 NOTE — Progress Notes (Signed)
Patient ID: Jesse Franklin, male   DOB: [birth year], 62 y.o.   MRN: [id number]   HPI:  Diarrhea: Pt presents today to f/u on diarrhea. States it has improved a lot. Was previously having it 3-5 times per day and is now having it just twice per day. It is slightly watery but becoming more formed. Had previously also had some night sweats, but these are better with using a fan. Denies abdominal pain, CP, SOB, lightheadedness, or blood in the stool.  ROS: See HPI  [place]: Last colonoscopy was in 2011 and pt was put on the 10 year call back list.  PHYSICAL EXAM: BP 127/77  Pulse 52  Temp(Src) 98.1 F (36.7 C) (Oral)  Ht 5' 6"  (1.676 m)  Wt 164 lb (74.39 kg)  BMI 26.48 kg/m2 Gen: NAD Heart: RRR Lungs: CTAB Abd: soft, nontender to palpation Neuro: nonfocal grossly, speech intact  ASSESSMENT/PLAN:  # Diarrhea: improving. Suggests likely viral etiology. - reviewed labwork from last visit. Hgb was 12.6, WBC 3.9, platelets 164. TSH was normal. - last colonoscopy was in 2011, with plans to repeat in 10 years - will give stool cards x3 to complete at home to ensure no GI bleeding since Hgb is slightly low (although this has been chronic) - recheck CBC in 6 months - precepted with Dr. Gwendlyn Deutscher re: CBC, who agrees with this plan.

## 2013-05-30 ENCOUNTER — Other Ambulatory Visit: Payer: Self-pay | Admitting: Cardiovascular Disease

## 2013-05-30 ENCOUNTER — Ambulatory Visit (INDEPENDENT_AMBULATORY_CARE_PROVIDER_SITE_OTHER): Payer: PRIVATE HEALTH INSURANCE | Admitting: Pharmacist

## 2013-05-30 DIAGNOSIS — I4891 Unspecified atrial fibrillation: Secondary | ICD-10-CM

## 2013-06-17 ENCOUNTER — Other Ambulatory Visit: Payer: Self-pay | Admitting: Family Medicine

## 2013-06-18 ENCOUNTER — Telehealth: Payer: Self-pay | Admitting: Family Medicine

## 2013-06-18 NOTE — Telephone Encounter (Signed)
Will forward to Dr Ardelia Mems

## 2013-06-18 NOTE — Telephone Encounter (Signed)
Pt called and would like a refill of his gout medication Colcrys sent to his pharmacy on file. JW

## 2013-06-18 NOTE — Telephone Encounter (Signed)
Rx sent in. Thanks! Azriel Jakob J Gurney Balthazor, MD  

## 2013-06-20 ENCOUNTER — Ambulatory Visit (INDEPENDENT_AMBULATORY_CARE_PROVIDER_SITE_OTHER): Payer: PRIVATE HEALTH INSURANCE | Admitting: *Deleted

## 2013-06-20 DIAGNOSIS — I4891 Unspecified atrial fibrillation: Secondary | ICD-10-CM

## 2013-06-20 LAB — POCT INR: INR: 3

## 2013-07-18 ENCOUNTER — Ambulatory Visit (INDEPENDENT_AMBULATORY_CARE_PROVIDER_SITE_OTHER): Payer: PRIVATE HEALTH INSURANCE | Admitting: *Deleted

## 2013-07-18 DIAGNOSIS — I4891 Unspecified atrial fibrillation: Secondary | ICD-10-CM

## 2013-07-23 ENCOUNTER — Other Ambulatory Visit: Payer: Self-pay | Admitting: Family Medicine

## 2013-08-15 ENCOUNTER — Ambulatory Visit (INDEPENDENT_AMBULATORY_CARE_PROVIDER_SITE_OTHER): Payer: PRIVATE HEALTH INSURANCE | Admitting: *Deleted

## 2013-08-15 DIAGNOSIS — I4891 Unspecified atrial fibrillation: Secondary | ICD-10-CM

## 2013-08-18 LAB — POC HEMOCCULT BLD/STL (HOME/3-CARD/SCREEN): Card #3 Fecal Occult Blood, POC: POSITIVE

## 2013-08-18 NOTE — Addendum Note (Signed)
Addended by: [place], Aalyssa Elderkin on: 08/18/2013 05:48 PM   Modules accepted: Orders

## 2013-08-22 ENCOUNTER — Telehealth: Payer: Self-pay | Admitting: Family Medicine

## 2013-08-22 NOTE — Telephone Encounter (Signed)
Message from MD given to pt.  I gave pt [facility] phone number to call and schedule an appt.  Pt verbalized understanding. Doriann Zuch, Loralyn Freshwater, [place]

## 2013-08-22 NOTE — Telephone Encounter (Signed)
[place] red team, please call pt to inform him that one out of three of his stool cards tested positive for blood in the stool, which means he needs a colonoscopy to further evaluate this. He has gone to [facility] in the past.   He can call them and schedule a visit, or we can refer him if necessary. Please call to let him know. Thanks! Leeanne Rio, MD

## 2013-09-17 ENCOUNTER — Ambulatory Visit (INDEPENDENT_AMBULATORY_CARE_PROVIDER_SITE_OTHER): Payer: PRIVATE HEALTH INSURANCE | Admitting: Family Medicine

## 2013-09-17 ENCOUNTER — Encounter: Payer: Self-pay | Admitting: Family Medicine

## 2013-09-17 VITALS — BP 137/73 | HR 53 | Ht 66.0 in | Wt 166.0 lb

## 2013-09-17 DIAGNOSIS — M109 Gout, unspecified: Secondary | ICD-10-CM

## 2013-09-17 DIAGNOSIS — F339 Major depressive disorder, recurrent, unspecified: Secondary | ICD-10-CM

## 2013-09-17 DIAGNOSIS — D539 Nutritional anemia, unspecified: Secondary | ICD-10-CM

## 2013-09-17 DIAGNOSIS — E7800 Pure hypercholesterolemia, unspecified: Secondary | ICD-10-CM

## 2013-09-17 DIAGNOSIS — I10 Essential (primary) hypertension: Secondary | ICD-10-CM

## 2013-09-17 LAB — CBC WITH DIFFERENTIAL/PLATELET
Basophils Absolute: 0 10*3/uL (ref 0.0–0.1)
Basophils Relative: 0 % (ref 0–1)
Eosinophils Absolute: 0.1 10*3/uL (ref 0.0–0.7)
Eosinophils Relative: 1 % (ref 0–5)
HCT: 37.3 % — ABNORMAL LOW (ref 39.0–52.0)
Hemoglobin: 13 g/dL (ref 13.0–17.0)
Lymphocytes Relative: 39 % (ref 12–46)
Lymphs Abs: 1.5 10*3/uL (ref 0.7–4.0)
MCH: 31 pg (ref 26.0–34.0)
MCHC: 34.9 g/dL (ref 30.0–36.0)
MCV: 89 fL (ref 78.0–100.0)
MONO ABS: 0.4 10*3/uL (ref 0.1–1.0)
MONOS PCT: 11 % (ref 3–12)
NEUTROS PCT: 49 % (ref 43–77)
Neutro Abs: 1.9 10*3/uL (ref 1.7–7.7)
Platelets: 153 10*3/uL (ref 150–400)
RBC: 4.19 MIL/uL — AB (ref 4.22–5.81)
RDW: 15 % (ref 11.5–15.5)
WBC: 3.8 10*3/uL — ABNORMAL LOW (ref 4.0–10.5)

## 2013-09-17 LAB — COMPREHENSIVE METABOLIC PANEL
ALBUMIN: 4.5 g/dL (ref 3.5–5.2)
ALK PHOS: 88 U/L (ref 39–117)
ALT: 26 U/L (ref 0–53)
AST: 26 U/L (ref 0–37)
BUN: 9 mg/dL (ref 6–23)
CALCIUM: 9.6 mg/dL (ref 8.4–10.5)
CHLORIDE: 104 meq/L (ref 96–112)
CO2: 25 mEq/L (ref 19–32)
CREATININE: 1.04 mg/dL (ref 0.50–1.35)
Glucose, Bld: 95 mg/dL (ref 70–99)
POTASSIUM: 3.5 meq/L (ref 3.5–5.3)
Sodium: 138 mEq/L (ref 135–145)
Total Bilirubin: 0.7 mg/dL (ref 0.3–1.2)
Total Protein: 7.8 g/dL (ref 6.0–8.3)

## 2013-09-17 MED ORDER — ROSUVASTATIN CALCIUM 40 MG PO TABS: ORAL_TABLET | ORAL | Status: DC

## 2013-09-17 MED ORDER — ESOMEPRAZOLE MAGNESIUM 40 MG PO CPDR: DELAYED_RELEASE_CAPSULE | ORAL | Status: DC

## 2013-09-17 MED ORDER — RAMIPRIL 10 MG PO CAPS: ORAL_CAPSULE | ORAL | Status: DC

## 2013-09-17 MED ORDER — COLCHICINE 0.6 MG PO TABS: ORAL_TABLET | ORAL | Status: DC

## 2013-09-17 MED ORDER — AMLODIPINE BESYLATE 10 MG PO TABS: ORAL_TABLET | ORAL | Status: DC

## 2013-09-17 NOTE — Assessment & Plan Note (Signed)
On crestor 3m daily. Will check CMET today. Won't check lipids as this won't alter our therapy as he has known CAD. Continue current regimen.

## 2013-09-17 NOTE — Assessment & Plan Note (Signed)
Well-controlled.  Continue current regimen. 

## 2013-09-17 NOTE — Progress Notes (Signed)
Patient ID: Jesse Franklin, male   DOB: [birth year], 63 y.o.   MRN: [id number]  HPI:  HTN: Currently taking ramipril 70m BID, amlodipine 122mdaily. Denies HA, SOB, CP, lower extremity edema. Endorses occasional blurry vision when he is watching TV which improves when he rubs his eyes.   HLD: taking crestor 4025maily. See above ROS.  Gout: currently taking colchicine 0.6mg89mD. Has flare once in a while. Otherwise states this is well controlled.  Anemia/Heme positive stool: had positive stool cards in December. He was called and instructed to get an appointment with [facility] for a colonoscopy but he hasn't done this yet. Is planning to do it before the end of the month. He has the phone number and is planning to call.   Mood: has been up and down but overall doing well. Sometimes gets frustrated and wants to cry, so finds cartoons on TV to help him feel better. No SI/HI. Does not want to see psychiatrist. Not currently on any psychiatric medications.  ROS: See HPI  [facility] [place] afib, [facility] who also manages coumadin. Current daily smoker.  PHYSICAL EXAM: BP 137/73  Pulse 53  Ht 5' 6"  (1.676 m)  Wt 166 lb (75.297 kg)  BMI 26.81 kg/m2 Gen: NAD HEENT: NCAT, PERRL Heart: irregularly irregular rhythm Lungs: CTAB, NWOB Neuro: grossly nonfocal, speech intact Ext: calves NTTP, no gross LE edema  ASSESSMENT/PLAN:  See problem based charting for additional assessment/plan.   FOLLOW UP: F/u in 6 months for chronic medical problems. F/u this month with GI for colonoscopy given heme + stool.  Jesse Franklin

## 2013-09-17 NOTE — Assessment & Plan Note (Signed)
Heme positive stool in December. Encouraged pt to call and get in with [facility] for colonoscopy. Will recheck CBC today.

## 2013-09-17 NOTE — Patient Instructions (Addendum)
It was great to see you again today!  I'll send in refills on your medicines. Please call [facility] to schedule a colonoscopy. Call our office if you need the phone number. I would like to see you back in 6 months, or sooner if you have any problems.  I will call you if your test results are not normal.  Otherwise, I will send you a letter.  If you do not hear from me with in 2 weeks please call our office.      Be well, Dr. Ardelia Mems

## 2013-09-17 NOTE — Assessment & Plan Note (Addendum)
Stable  Continue current regimen  

## 2013-09-17 NOTE — Assessment & Plan Note (Signed)
Mood not a problem per patient. He states he is doing well. No SI/HI. Will continue to monitor at future office visits.

## 2013-09-18 ENCOUNTER — Encounter: Payer: Self-pay | Admitting: Family Medicine

## 2013-09-19 ENCOUNTER — Ambulatory Visit (INDEPENDENT_AMBULATORY_CARE_PROVIDER_SITE_OTHER): Payer: PRIVATE HEALTH INSURANCE | Admitting: Pharmacist

## 2013-09-19 DIAGNOSIS — I4891 Unspecified atrial fibrillation: Secondary | ICD-10-CM

## 2013-09-19 LAB — POCT INR: INR: 2

## 2013-09-23 ENCOUNTER — Telehealth: Payer: Self-pay | Admitting: Family Medicine

## 2013-09-23 DIAGNOSIS — R195 Other fecal abnormalities: Secondary | ICD-10-CM

## 2013-09-23 NOTE — Telephone Encounter (Signed)
Will fwd to PCP.  Mery Guadalupe, Loralyn Freshwater, [place]

## 2013-09-23 NOTE — Telephone Encounter (Signed)
Pt called and wanted to know if he can get a referral for a colonoscopy?

## 2013-09-24 NOTE — Telephone Encounter (Signed)
Forward to [place] to complete referral and call patient with appointment.Busick, Kevin Fenton

## 2013-09-24 NOTE — Telephone Encounter (Signed)
Referral entered. Please call pt to inform. Thanks! Leeanne Rio, MD

## 2013-09-30 NOTE — Telephone Encounter (Signed)
Will fwd to MD so she is aware.  Jesse Franklin, Jesse Franklin, Jesse Franklin

## 2013-09-30 NOTE — Telephone Encounter (Signed)
Pt has and appt 01/28 @8 :30 [facility]. Pt is aware.   [place]

## 2013-10-14 ENCOUNTER — Encounter: Payer: Self-pay | Admitting: *Deleted

## 2013-10-16 ENCOUNTER — Telehealth: Payer: Self-pay | Admitting: Cardiovascular Disease

## 2013-10-16 NOTE — Telephone Encounter (Signed)
Received request from Nurse fax box, documents faxed for surgical clearance. To: [facility] Fax number: [phone] Attention: [phone]/kdm

## 2013-10-17 NOTE — Telephone Encounter (Signed)
This encounter was created in error - please disregard.

## 2013-10-19 ENCOUNTER — Other Ambulatory Visit: Payer: Self-pay | Admitting: Cardiovascular Disease

## 2013-10-31 ENCOUNTER — Ambulatory Visit (INDEPENDENT_AMBULATORY_CARE_PROVIDER_SITE_OTHER): Payer: PRIVATE HEALTH INSURANCE | Admitting: *Deleted

## 2013-10-31 DIAGNOSIS — I4891 Unspecified atrial fibrillation: Secondary | ICD-10-CM

## 2013-10-31 LAB — POCT INR: INR: 1.9

## 2013-10-31 NOTE — Patient Instructions (Signed)
11/07/2013 last day to take coumadin before procedures 11/08/2013 until 11/13/2013 day of procedures continue to hold coumadin 11/13/2013 day of procedures when instructed by MD doing procedures to restart coumadin restart at same dose except take extra 1/2 tablet for 2 days and come to clinic to have INR rechecked on 11/20/2013

## 2013-11-13 ENCOUNTER — Other Ambulatory Visit: Payer: Self-pay | Admitting: Gastroenterology

## 2013-11-20 ENCOUNTER — Ambulatory Visit (INDEPENDENT_AMBULATORY_CARE_PROVIDER_SITE_OTHER): Payer: PRIVATE HEALTH INSURANCE | Admitting: *Deleted

## 2013-11-20 DIAGNOSIS — Z5181 Encounter for therapeutic drug level monitoring: Secondary | ICD-10-CM | POA: Insufficient documentation

## 2013-11-20 DIAGNOSIS — I4891 Unspecified atrial fibrillation: Secondary | ICD-10-CM

## 2013-11-20 LAB — POCT INR: INR: 2.1

## 2013-12-02 ENCOUNTER — Encounter: Payer: Self-pay | Admitting: Cardiovascular Disease

## 2013-12-02 ENCOUNTER — Ambulatory Visit (INDEPENDENT_AMBULATORY_CARE_PROVIDER_SITE_OTHER): Payer: PRIVATE HEALTH INSURANCE | Admitting: Cardiovascular Disease

## 2013-12-02 VITALS — BP 112/86 | HR 51 | Ht 66.0 in | Wt 164.6 lb

## 2013-12-02 DIAGNOSIS — I2789 Other specified pulmonary heart diseases: Secondary | ICD-10-CM

## 2013-12-02 DIAGNOSIS — I272 Pulmonary hypertension, unspecified: Secondary | ICD-10-CM

## 2013-12-02 DIAGNOSIS — I10 Essential (primary) hypertension: Secondary | ICD-10-CM

## 2013-12-02 DIAGNOSIS — I4891 Unspecified atrial fibrillation: Secondary | ICD-10-CM

## 2013-12-02 DIAGNOSIS — I2510 Atherosclerotic heart disease of native coronary artery without angina pectoris: Secondary | ICD-10-CM

## 2013-12-02 NOTE — Patient Instructions (Signed)
Your physician has requested that you have an echocardiogram. Echocardiography is a painless test that uses sound waves to create images of your heart. It provides your doctor with information about the size and shape of your heart and how well your heart's chambers and valves are working. This procedure takes approximately one hour. There are no restrictions for this procedure.  Your physician wants you to follow-up in: 1 YEAR WITH EKG You will receive a reminder letter in the mail two months in advance. If you don't receive a letter, please call our office to schedule the follow-up appointment.

## 2013-12-02 NOTE — Progress Notes (Signed)
Jesse Franklin Date of Birth  [birth year]       [facility]    [employer] [address], [address], [place], [place]  [address]   [place], [place]  [address] [phone]     [phone]   Fax  [phone]    Fax [phone]  Problem List: 1. Atrial fibrillation 2. Coronary artery disease RESULTS: 08/30/04 1. Left main: Normal.  2. LAD: Large vessel, tortuous in the mid and distal section with mild  luminal irregularities. The extreme distal portion of the LAD after the  trifurcation of the distal vessel has a subtotal occlusion at the  trifurcation tip. Vessel less than 0.5 mm at this juncture.  3. LXC: Codominant with mild luminal irregularities.  4. OM1 and OM2: Large vessels with mild luminal irregularities.  5. RCA: Tortuous, codominant with mild luminal irregularities, very faint  right to left filling collaterals seen at the apex.  6. LV: EF 50-55% with mild inferior apical hypokinesis. LVEDP was 35  mmHg.  7. Abdominal/distal aortogram showed mild tortuosity. No significant  aneurysms were seen. The distal aorta showed mild splaying to the left  at the area of the iliac bifurcation. No significant iliac or femoral  disease was noted.  3. Hypertension 4. 6. Moderate pulmonary hypertension by echo in 2010 ( ongoing cigarette smoking)  5. Hyperlipidemia   History of Present Illness:  Jesse Franklin is a 63 yo with hx as noted above. He has occasional episodes of left arm tingling. These episodes last 1-2 seconds. They're not necessarily associated with exertion.  The tingling seems to get better with arm movement. He also has occasional episodes of shortness of breath.  He walks on a regular basis. He walks approximately 1 mile a day.  He still eats some salt and also eats fried and fast foods.  December 02, 2013:  Jesse Franklin is doing well. He denies any chest pain or shortness of breath.  He walks about 15-20 minutes each day.  He no  longer works.  He has cut down on his salt usage.   Current Outpatient Prescriptions on File Prior to Visit  Medication Sig Dispense Refill  . amLODipine (NORVASC) 10 MG tablet TAKE 1 TABLET (10 MG TOTAL) BY MOUTH DAILY.  90 tablet  1  . clopidogrel (PLAVIX) 75 MG tablet TAKE 1 TABLET BY MOUTH EVERY DAY  30 tablet  11  . colchicine (COLCRYS) 0.6 MG tablet TAKE 1 TABLET (0.6 MG TOTAL) BY MOUTH 2 (TWO) TIMES DAILY.  180 tablet  1  . esomeprazole (NEXIUM) 40 MG capsule TAKE ONE CAPSULE BY MOUTH EVERY DAY  90 capsule  1  . nitroGLYCERIN (NITROSTAT) 0.4 MG SL tablet Place 1 tablet (0.4 mg total) under the tongue as directed. Place 1 tab under tongue as directed  25 tablet  6  . ramipril (ALTACE) 10 MG capsule TAKE 1 CAPSULE (10 MG TOTAL) BY MOUTH 2 (TWO) TIMES DAILY.  180 capsule  1  . rosuvastatin (CRESTOR) 40 MG tablet TAKE 1 TABLET (40 MG TOTAL) BY MOUTH DAILY.  90 tablet  1  . warfarin (COUMADIN) 5 MG tablet TAKE AS DIRECTED BY ANTICOAGULATION CLINIC  40 tablet  3  . sildenafil (VIAGRA) 100 MG tablet Take 0.5-1 tablets (50-100 mg total) by mouth daily as needed for erectile dysfunction.  5 tablet  6   No current facility-administered medications on file prior to visit.    Allergies  Allergen Reactions  .  Ketorolac Tromethamine     REACTION: hives    Past Medical History  Diagnosis Date  . History of cardiac cath   . Anemia   . Hypertension   . Gout   . Atrial fibrillation   . GERD (gastroesophageal reflux disease)   . Hyperlipidemia   . Herpes   . RHINITIS, ALLERGIC 11/08/2006  . Herpes genitalia 04/13/2011  . CAD (coronary artery disease)     [place]  . Tobacco abuse   . Alcohol abuse   . Cocaine abuse     Hx of  . BPH 11/08/2006    Past Surgical History  Procedure Laterality Date  . Right hand surgery    . Cardiac catheterization      History  Smoking status  . Current Every Day Smoker -- 0.30 packs/day  . Types: Cigarettes  Smokeless tobacco  . Former Systems developer    . Types: Chew  . Quit date: 09/12/1967    Comment: Chewed Red Man Tobacco    History  Alcohol Use  . Yes    Family History  Problem Relation Age of Onset  . Alcohol abuse Father   . Heart disease Brother   . Alcohol abuse Brother   . Cancer Sister     Reviw of Systems:  Reviewed in the HPI.  All other systems are negative.  Physical Exam: Blood pressure 112/86, pulse 51, height 5' 6"  (1.676 m), weight 164 lb 9.6 oz (74.662 kg). General: Well developed, well nourished, in no acute distress. Head: Normocephalic, atraumatic, sclera non-icteric, mucus membranes are moist,  Neck: Supple. Carotids are 2 + without bruits. No JVD  Lungs:  Bilateral  wheezes, rhonchi Heart: irregularly irregular Abdomen: Soft, non-tender, non-distended with normal bowel sounds. Msk:  Strength and tone are normal  Extremities: No clubbing or cyanosis. No edema.  Distal pedal pulses are 2+ and equal  Neuro: CN II - XII intact.  Alert and oriented X 3.  Psych:  Normal   ECG: December 02, 2013:  Atrial fib with a V rate of 51.   Assessment / Plan:

## 2013-12-02 NOTE — Assessment & Plan Note (Addendum)
Tamir has a history of pulmonary hypertension noted on echo in 2010.  His LV function is normal.    He continues to smoke. I've recommended he stop smoking. We will repeat the echo to further evaluate his pulmonary hypertension. Thus far, he remains basically a symptomatically.

## 2013-12-02 NOTE — Assessment & Plan Note (Addendum)
Jesse Franklin  has chronic atrial fibrillation. He is basically asymptomatic. His rate is well controlled. He had normal left trigger systolic function at the time of his last echocardiogram in 2002.  We'll repeat his echocardiogram. He has a history of pulmonary hypertension that needs further evaluation.  He continues to smoke - i have encouraged him to stop smoking so that his P-HTN does not get any worse.

## 2013-12-16 ENCOUNTER — Ambulatory Visit (HOSPITAL_COMMUNITY): Payer: PRIVATE HEALTH INSURANCE | Attending: Cardiovascular Disease | Admitting: Cardiology

## 2013-12-16 DIAGNOSIS — I272 Pulmonary hypertension, unspecified: Secondary | ICD-10-CM

## 2013-12-16 DIAGNOSIS — I2789 Other specified pulmonary heart diseases: Secondary | ICD-10-CM

## 2013-12-16 DIAGNOSIS — I4891 Unspecified atrial fibrillation: Secondary | ICD-10-CM | POA: Insufficient documentation

## 2013-12-16 NOTE — Progress Notes (Signed)
Echo performed. 

## 2013-12-18 ENCOUNTER — Ambulatory Visit (INDEPENDENT_AMBULATORY_CARE_PROVIDER_SITE_OTHER): Payer: PRIVATE HEALTH INSURANCE | Admitting: Pharmacist Clinician (PhC)/ Clinical Pharmacy Specialist

## 2013-12-18 DIAGNOSIS — I4891 Unspecified atrial fibrillation: Secondary | ICD-10-CM

## 2013-12-18 DIAGNOSIS — Z5181 Encounter for therapeutic drug level monitoring: Secondary | ICD-10-CM

## 2013-12-18 LAB — POCT INR: INR: 2.2

## 2014-01-13 ENCOUNTER — Other Ambulatory Visit: Payer: Self-pay | Admitting: Physician Assistant

## 2014-01-20 ENCOUNTER — Ambulatory Visit (INDEPENDENT_AMBULATORY_CARE_PROVIDER_SITE_OTHER): Payer: PRIVATE HEALTH INSURANCE | Admitting: Home Health Services

## 2014-01-20 ENCOUNTER — Encounter: Payer: Self-pay | Admitting: Home Health Services

## 2014-01-20 VITALS — BP 127/78 | HR 52 | Temp 97.1°F | Ht 66.0 in | Wt 165.5 lb

## 2014-01-20 DIAGNOSIS — Z0000 Encounter for general adult medical examination without abnormal findings: Secondary | ICD-10-CM

## 2014-01-20 NOTE — Progress Notes (Signed)
Patient here for annual wellness visit, patient reports: Risk Factors/Conditions needing evaluation or treatment: Pt. Does not have any risk factors that need evaluation.  Home Safety: Pt lives at home, by his self in a 2 story home.  Pt reports having smoke alarms. Other Information: Corrective lens: Pt wears reading corrective lens, has annaul eye exams. Dentures: Pt does not dentures, does not have regularlar dental exams. Memory: Pt denies memory problems.  Patient's Mini Mental Score (recorded in doc. flowsheet): 24 Pt has 8th grade educations and score may not reflect actual cognitive ability. Bladder:  Pt denies problems with bladder control.  BMI/Exercise: We discussed BMI and strategies for weight maintenance including increasing fruits and vegetables.  We also discussed  maintain a regular exercise routine.  Pt reports walking 20 minutes daily. Med Adherence:  We discussed importance of taking medications for htn, cholesterol.  Pt reports missing 0 day in the past week.  ADL/IADL:  Pt reports independence in all functions. Balance/Gait: Pt reports 0 falls in the past year.  We discussed home safety and fall prevention.   Hearing:  Pt does not reports any new problems with hearing.     Annual Wellness Visit Requirements Recorded Today In  Medical, family, social history Past Medical, Family, Social History Section  Current providers Care team  Current medications Medications  Wt, BP, Ht, BMI Vital signs  Tobacco, alcohol, illicit drug use History  ADL Nurse Assessment  Depression Screening Nurse Assessment  Cognitive impairment Nurse Assessment  Mini Mental Status Document Flowsheet  Fall Risk Fall/Depression  Home Safety Progress Note  End of Life Planning (welcome visit) Social Documentation  Medicare preventative services Progress Note  Risk factors/conditions needing evaluation/treatment Progress Note  Personalized health advice Patient Instructions, goals, letter   Diet & Exercise Social Documentation  Emergency Contact Social Documentation  Seat Belts Social Documentation  Sun exposure/protection Social Documentation

## 2014-01-23 ENCOUNTER — Encounter: Payer: Self-pay | Admitting: Home Health Services

## 2014-01-23 NOTE — Progress Notes (Signed)
Patient ID: Jesse Franklin, male   DOB: 1951/05/23, 63 y.o.   MRN: [id number]  Addendum: I have reviewed this visit and discussed with Lamont Dowdy and agree with her documentation.  Leeanne Rio, MD

## 2014-01-29 ENCOUNTER — Ambulatory Visit (INDEPENDENT_AMBULATORY_CARE_PROVIDER_SITE_OTHER): Payer: PRIVATE HEALTH INSURANCE

## 2014-01-29 DIAGNOSIS — Z5181 Encounter for therapeutic drug level monitoring: Secondary | ICD-10-CM

## 2014-01-29 DIAGNOSIS — I4891 Unspecified atrial fibrillation: Secondary | ICD-10-CM

## 2014-01-29 LAB — POCT INR: INR: 2.1

## 2014-02-16 ENCOUNTER — Other Ambulatory Visit: Payer: Self-pay | Admitting: Family Medicine

## 2014-03-11 ENCOUNTER — Other Ambulatory Visit: Payer: Self-pay | Admitting: Family Medicine

## 2014-03-12 ENCOUNTER — Ambulatory Visit (INDEPENDENT_AMBULATORY_CARE_PROVIDER_SITE_OTHER): Payer: PRIVATE HEALTH INSURANCE | Admitting: *Deleted

## 2014-03-12 ENCOUNTER — Telehealth: Payer: Self-pay | Admitting: Family Medicine

## 2014-03-12 DIAGNOSIS — I4891 Unspecified atrial fibrillation: Secondary | ICD-10-CM

## 2014-03-12 DIAGNOSIS — Z5181 Encounter for therapeutic drug level monitoring: Secondary | ICD-10-CM

## 2014-03-12 LAB — POCT INR: INR: 3.4

## 2014-03-12 NOTE — Telephone Encounter (Signed)
To [facility] red team - please call pt and let him know I have refilled his medicines but he needs to schedule an appointment to follow up on his medical problems. Thanks!  Leeanne Rio, MD

## 2014-03-16 NOTE — Telephone Encounter (Signed)
Called and read message to patient.Busick, Kevin Fenton

## 2014-03-18 ENCOUNTER — Other Ambulatory Visit: Payer: Self-pay | Admitting: Family Medicine

## 2014-03-19 ENCOUNTER — Other Ambulatory Visit: Payer: Self-pay | Admitting: Gastroenterology

## 2014-03-19 DIAGNOSIS — K625 Hemorrhage of anus and rectum: Secondary | ICD-10-CM

## 2014-03-23 ENCOUNTER — Ambulatory Visit
Admission: RE | Admit: 2014-03-23 | Discharge: 2014-03-23 | Disposition: A | Discharge status: Home / Self Care | Payer: PRIVATE HEALTH INSURANCE | Source: Ambulatory Visit | Attending: Gastroenterology | Admitting: Gastroenterology

## 2014-03-23 DIAGNOSIS — K625 Hemorrhage of anus and rectum: Secondary | ICD-10-CM

## 2014-04-02 ENCOUNTER — Ambulatory Visit (INDEPENDENT_AMBULATORY_CARE_PROVIDER_SITE_OTHER): Payer: PRIVATE HEALTH INSURANCE | Admitting: *Deleted

## 2014-04-02 DIAGNOSIS — I4891 Unspecified atrial fibrillation: Secondary | ICD-10-CM

## 2014-04-02 DIAGNOSIS — Z5181 Encounter for therapeutic drug level monitoring: Secondary | ICD-10-CM

## 2014-04-02 LAB — POCT INR: INR: 2.5

## 2014-04-19 ENCOUNTER — Other Ambulatory Visit: Payer: Self-pay | Admitting: Cardiovascular Disease

## 2014-04-30 ENCOUNTER — Ambulatory Visit (INDEPENDENT_AMBULATORY_CARE_PROVIDER_SITE_OTHER): Payer: PRIVATE HEALTH INSURANCE | Admitting: Pharmacist

## 2014-04-30 DIAGNOSIS — I4891 Unspecified atrial fibrillation: Secondary | ICD-10-CM

## 2014-04-30 DIAGNOSIS — Z5181 Encounter for therapeutic drug level monitoring: Secondary | ICD-10-CM

## 2014-04-30 LAB — POCT INR: INR: 2.5

## 2014-05-15 ENCOUNTER — Other Ambulatory Visit: Payer: Self-pay | Admitting: Family Medicine

## 2014-05-21 ENCOUNTER — Other Ambulatory Visit: Payer: Self-pay | Admitting: *Deleted

## 2014-05-28 ENCOUNTER — Ambulatory Visit (INDEPENDENT_AMBULATORY_CARE_PROVIDER_SITE_OTHER): Payer: PRIVATE HEALTH INSURANCE

## 2014-05-28 DIAGNOSIS — I4891 Unspecified atrial fibrillation: Secondary | ICD-10-CM

## 2014-05-28 DIAGNOSIS — Z5181 Encounter for therapeutic drug level monitoring: Secondary | ICD-10-CM

## 2014-05-28 LAB — POCT INR: INR: 2.6

## 2014-06-12 ENCOUNTER — Other Ambulatory Visit: Payer: Self-pay | Admitting: Family Medicine

## 2014-07-08 ENCOUNTER — Encounter: Payer: Self-pay | Admitting: Family Medicine

## 2014-07-08 ENCOUNTER — Ambulatory Visit (INDEPENDENT_AMBULATORY_CARE_PROVIDER_SITE_OTHER): Payer: PRIVATE HEALTH INSURANCE | Admitting: Family Medicine

## 2014-07-08 VITALS — BP 129/71 | HR 100 | Temp 98.0°F | Wt 167.0 lb

## 2014-07-08 DIAGNOSIS — E7800 Pure hypercholesterolemia, unspecified: Secondary | ICD-10-CM

## 2014-07-08 DIAGNOSIS — M10021 Idiopathic gout, right elbow: Secondary | ICD-10-CM

## 2014-07-08 DIAGNOSIS — M109 Gout, unspecified: Secondary | ICD-10-CM

## 2014-07-08 DIAGNOSIS — K219 Gastro-esophageal reflux disease without esophagitis: Secondary | ICD-10-CM

## 2014-07-08 DIAGNOSIS — E785 Hyperlipidemia, unspecified: Secondary | ICD-10-CM

## 2014-07-08 DIAGNOSIS — M10031 Idiopathic gout, right wrist: Secondary | ICD-10-CM

## 2014-07-08 DIAGNOSIS — D649 Anemia, unspecified: Secondary | ICD-10-CM

## 2014-07-08 DIAGNOSIS — Z23 Encounter for immunization: Secondary | ICD-10-CM

## 2014-07-08 MED ORDER — PREDNISONE 50 MG PO TABS: 50.0000 mg | ORAL_TABLET | Freq: Every day | ORAL | Status: DC

## 2014-07-08 MED ORDER — COLCHICINE 0.6 MG PO TABS: ORAL_TABLET | ORAL | Status: DC

## 2014-07-08 NOTE — Progress Notes (Signed)
Patient ID: Jesse Franklin, male   DOB: 02/08/1951, 63 y.o.   MRN: [id number]  HPI:  Gout exacerbation: in R wrist to elbow. Hot and swollen from wrist to elbow. Onset 3 weeks ago. The night before it started, he had a lot of alcohol to drink (>10 glasses of wine). Took ibuprofen 639m, not helping. Felt sweaty, hot all over when it initially started. Has tried ice on his arm and it hasn't helped. Takes colchicine 0.668mBID at baseline. Not on allopurinol chronically. This is consistent with his prior gout attacks.  Heart burn: feels as though his nexium is not helping. Wants something else to help with this.  ROS: See HPI.  [place] CAD, tobacco abuse, HLD, HTN, afib on coumadin, BPH  PHYSICAL EXAM: BP 129/71  Pulse 100  Temp(Src) 98 F (36.7 C) (Oral)  Wt 167 lb (75.751 kg) Gen: NAD, pleasant and cooperative HEENT: NCAT Heart: RRR Lungs: CTAB Neuro: grossly nonfocal speech normal Abd: soft NTTP Ext: R elbow and wrist TTP. No erythema but mild swelling and mild warmth. Sensation intact distally. Good 2+ radial pulse on R. Grip strength in R hand diminished due to pain. Tophi present on L arm.  ASSESSMENT/PLAN:  Health maintenance:  -flu shot given today  -advised that pt schedule a separate visit for a physical to address health maintenance issues.   Gout Acute flare. Will dose colchicine for acute flare (0.60m68mow, repeat in 1 hour, then resume BID dosing in 12 hours) and also rx prednisone 62m64mily x 5 days. Check uric acid level. F/u in 3-4 weeks to discuss starting allopurinol when no longer in acute flare. Precepted with Dr. WaldMingo Amber also examined patient and agrees with this plan.   HYPERCHOLESTEROLEMIA Pt to return for fasting lipids and CMET. Currently on crestor 40mg49mly, which is maximal therapy, so we are checking lipids just to monitor compliance.  GASTROESOPHAGEAL REFLUX, NO ESOPHAGITIS Sx's not well controlled. Will add ranitidine 162mg 460m F/u in 3-4 weeks  to evaluate for improvement.   FOLLOW UP: F/u in 3-4 weeks for gout and reflux  BrittaTanzaniaIntyArdelia MemsonBlanding

## 2014-07-08 NOTE — Patient Instructions (Signed)
It was great to see you again today!  For gout:  Take colchicine 2 pills now, then 1 pill in 1 hour. In 12 hours, restart taking it one pill twice a day Take prednisone 28m daily for 5 days Return in 3-4 weeks to be reassessed  For reflux: -start ranitidine 1560mtwice daily -check up on this in 3-4 weeks  Schedule a separate health maintenance visit (routine yearly physical). At this visit we will make sure you are up to date on all your important health maintenance items.   On your way out, schedule an appointment one morning to come back for fasting labs. Do not eat or drink anything other than water the morning of your lab appointment until after your labs are drawn.   Be well, Dr. McArdelia Mems

## 2014-07-09 ENCOUNTER — Ambulatory Visit (INDEPENDENT_AMBULATORY_CARE_PROVIDER_SITE_OTHER): Payer: PRIVATE HEALTH INSURANCE | Admitting: *Deleted

## 2014-07-09 ENCOUNTER — Other Ambulatory Visit: Payer: PRIVATE HEALTH INSURANCE

## 2014-07-09 DIAGNOSIS — M109 Gout, unspecified: Secondary | ICD-10-CM

## 2014-07-09 DIAGNOSIS — D649 Anemia, unspecified: Secondary | ICD-10-CM

## 2014-07-09 DIAGNOSIS — E785 Hyperlipidemia, unspecified: Secondary | ICD-10-CM

## 2014-07-09 DIAGNOSIS — I4891 Unspecified atrial fibrillation: Secondary | ICD-10-CM

## 2014-07-09 DIAGNOSIS — Z5181 Encounter for therapeutic drug level monitoring: Secondary | ICD-10-CM

## 2014-07-09 LAB — COMPREHENSIVE METABOLIC PANEL
ALT: 39 U/L (ref 0–53)
AST: 35 U/L (ref 0–37)
Albumin: 4.2 g/dL (ref 3.5–5.2)
Alkaline Phosphatase: 107 U/L (ref 39–117)
BUN: 12 mg/dL (ref 6–23)
CO2: 20 meq/L (ref 19–32)
Calcium: 9.6 mg/dL (ref 8.4–10.5)
Chloride: 106 mEq/L (ref 96–112)
Creat: 1.21 mg/dL (ref 0.50–1.35)
GLUCOSE: 80 mg/dL (ref 70–99)
Potassium: 3.6 mEq/L (ref 3.5–5.3)
Sodium: 139 mEq/L (ref 135–145)
Total Bilirubin: 0.7 mg/dL (ref 0.2–1.2)
Total Protein: 7.7 g/dL (ref 6.0–8.3)

## 2014-07-09 LAB — CBC
HEMATOCRIT: 35.3 % — AB (ref 39.0–52.0)
HEMOGLOBIN: 12.3 g/dL — AB (ref 13.0–17.0)
MCH: 30.7 pg (ref 26.0–34.0)
MCHC: 34.8 g/dL (ref 30.0–36.0)
MCV: 88 fL (ref 78.0–100.0)
Platelets: 266 10*3/uL (ref 150–400)
RBC: 4.01 MIL/uL — ABNORMAL LOW (ref 4.22–5.81)
RDW: 13.7 % (ref 11.5–15.5)
WBC: 3.2 10*3/uL — ABNORMAL LOW (ref 4.0–10.5)

## 2014-07-09 LAB — LIPID PANEL
CHOLESTEROL: 129 mg/dL (ref 0–200)
HDL: 34 mg/dL — AB (ref >39)
LDL Cholesterol: 74 mg/dL (ref 0–99)
TRIGLYCERIDES: 105 mg/dL (ref <150)
Total CHOL/HDL Ratio: 3.8 Ratio
VLDL: 21 mg/dL (ref 0–40)

## 2014-07-09 LAB — POCT INR: INR: 2.4

## 2014-07-09 LAB — URIC ACID: URIC ACID, SERUM: 7.5 mg/dL (ref 4.0–7.8)

## 2014-07-09 NOTE — Progress Notes (Signed)
CMP,FLP,CBC AND URIC ACID DONE TODAY Jesse Franklin

## 2014-07-17 ENCOUNTER — Other Ambulatory Visit: Payer: Self-pay | Admitting: Cardiovascular Disease

## 2014-07-19 NOTE — Assessment & Plan Note (Signed)
Acute flare. Will dose colchicine for acute flare (0.28m now, repeat in 1 hour, then resume BID dosing in 12 hours) and also rx prednisone 555mdaily x 5 days. Check uric acid level. F/u in 3-4 weeks to discuss starting allopurinol when no longer in acute flare. Precepted with Dr. WaMingo Amberho also examined patient and agrees with this plan.

## 2014-07-19 NOTE — Assessment & Plan Note (Signed)
Pt to return for fasting lipids and CMET. Currently on crestor 87m daily, which is maximal therapy, so we are checking lipids just to monitor compliance.

## 2014-07-19 NOTE — Assessment & Plan Note (Signed)
Sx's not well controlled. Will add ranitidine 123m BID. F/u in 3-4 weeks to evaluate for improvement.

## 2014-07-21 ENCOUNTER — Other Ambulatory Visit: Payer: Self-pay | Admitting: Cardiovascular Disease

## 2014-07-27 ENCOUNTER — Telehealth: Payer: Self-pay | Admitting: *Deleted

## 2014-07-27 NOTE — Telephone Encounter (Signed)
Pt informed. [place], [place] CMA

## 2014-07-27 NOTE — Telephone Encounter (Signed)
-----   Message from Leeanne Rio, MD sent at 07/24/2014  6:05 PM EST ----- [facility] red team, please let pt know that his white blood count was a little low, so we will repeat this when he returns to follow up in a few weeks. His cholesterol looks great.

## 2014-07-31 ENCOUNTER — Encounter: Payer: Self-pay | Admitting: Family Medicine

## 2014-07-31 ENCOUNTER — Inpatient Hospital Stay (HOSPITAL_COMMUNITY)
Admission: EM | Admit: 2014-07-31 | Discharge: 2014-08-03 | DRG: 641 | Disposition: A | Discharge status: Home / Self Care | Payer: PRIVATE HEALTH INSURANCE | Attending: Family Medicine | Admitting: Family Medicine

## 2014-07-31 ENCOUNTER — Encounter (HOSPITAL_COMMUNITY): Payer: Self-pay

## 2014-07-31 ENCOUNTER — Ambulatory Visit (HOSPITAL_COMMUNITY)
Admission: RE | Admit: 2014-07-31 | Discharge: 2014-07-31 | Disposition: A | Discharge status: Home / Self Care | Payer: PRIVATE HEALTH INSURANCE | Source: Ambulatory Visit | Attending: Family Medicine | Admitting: Family Medicine

## 2014-07-31 ENCOUNTER — Other Ambulatory Visit: Payer: Self-pay

## 2014-07-31 ENCOUNTER — Emergency Department (HOSPITAL_COMMUNITY): Payer: PRIVATE HEALTH INSURANCE

## 2014-07-31 ENCOUNTER — Ambulatory Visit (INDEPENDENT_AMBULATORY_CARE_PROVIDER_SITE_OTHER): Payer: PRIVATE HEALTH INSURANCE | Admitting: Family Medicine

## 2014-07-31 VITALS — BP 124/74 | HR 81 | Temp 98.1°F | Ht 66.0 in | Wt 156.1 lb

## 2014-07-31 DIAGNOSIS — F1010 Alcohol abuse, uncomplicated: Secondary | ICD-10-CM | POA: Diagnosis present

## 2014-07-31 DIAGNOSIS — K219 Gastro-esophageal reflux disease without esophagitis: Secondary | ICD-10-CM | POA: Diagnosis present

## 2014-07-31 DIAGNOSIS — E7800 Pure hypercholesterolemia, unspecified: Secondary | ICD-10-CM | POA: Diagnosis present

## 2014-07-31 DIAGNOSIS — E872 Acidosis, unspecified: Secondary | ICD-10-CM | POA: Diagnosis present

## 2014-07-31 DIAGNOSIS — I10 Essential (primary) hypertension: Secondary | ICD-10-CM | POA: Diagnosis present

## 2014-07-31 DIAGNOSIS — Z9229 Personal history of other drug therapy: Secondary | ICD-10-CM

## 2014-07-31 DIAGNOSIS — F209 Schizophrenia, unspecified: Secondary | ICD-10-CM | POA: Diagnosis present

## 2014-07-31 DIAGNOSIS — I2510 Atherosclerotic heart disease of native coronary artery without angina pectoris: Secondary | ICD-10-CM | POA: Diagnosis present

## 2014-07-31 DIAGNOSIS — R079 Chest pain, unspecified: Secondary | ICD-10-CM

## 2014-07-31 DIAGNOSIS — R197 Diarrhea, unspecified: Secondary | ICD-10-CM | POA: Insufficient documentation

## 2014-07-31 DIAGNOSIS — R748 Abnormal levels of other serum enzymes: Secondary | ICD-10-CM | POA: Diagnosis present

## 2014-07-31 DIAGNOSIS — Z7901 Long term (current) use of anticoagulants: Secondary | ICD-10-CM | POA: Diagnosis not present

## 2014-07-31 DIAGNOSIS — E785 Hyperlipidemia, unspecified: Secondary | ICD-10-CM | POA: Diagnosis present

## 2014-07-31 DIAGNOSIS — F329 Major depressive disorder, single episode, unspecified: Secondary | ICD-10-CM | POA: Diagnosis present

## 2014-07-31 DIAGNOSIS — R112 Nausea with vomiting, unspecified: Secondary | ICD-10-CM | POA: Diagnosis present

## 2014-07-31 DIAGNOSIS — I4891 Unspecified atrial fibrillation: Secondary | ICD-10-CM | POA: Diagnosis present

## 2014-07-31 DIAGNOSIS — F17210 Nicotine dependence, cigarettes, uncomplicated: Secondary | ICD-10-CM | POA: Diagnosis present

## 2014-07-31 DIAGNOSIS — F1290 Cannabis use, unspecified, uncomplicated: Secondary | ICD-10-CM | POA: Diagnosis present

## 2014-07-31 DIAGNOSIS — M6282 Rhabdomyolysis: Secondary | ICD-10-CM | POA: Diagnosis present

## 2014-07-31 DIAGNOSIS — E860 Dehydration: Secondary | ICD-10-CM | POA: Diagnosis present

## 2014-07-31 DIAGNOSIS — F1410 Cocaine abuse, uncomplicated: Secondary | ICD-10-CM | POA: Diagnosis present

## 2014-07-31 DIAGNOSIS — I208 Other forms of angina pectoris: Secondary | ICD-10-CM | POA: Insufficient documentation

## 2014-07-31 DIAGNOSIS — N400 Enlarged prostate without lower urinary tract symptoms: Secondary | ICD-10-CM | POA: Diagnosis present

## 2014-07-31 DIAGNOSIS — N179 Acute kidney failure, unspecified: Secondary | ICD-10-CM | POA: Diagnosis present

## 2014-07-31 DIAGNOSIS — M109 Gout, unspecified: Secondary | ICD-10-CM | POA: Diagnosis present

## 2014-07-31 HISTORY — DX: Acute myocardial infarction, unspecified: I21.9

## 2014-07-31 LAB — COMPREHENSIVE METABOLIC PANEL
ALK PHOS: 95 U/L (ref 39–117)
ALT: 31 U/L (ref 0–53)
AST: 30 U/L (ref 0–37)
Albumin: 4 g/dL (ref 3.5–5.2)
Anion gap: 18 — ABNORMAL HIGH (ref 5–15)
BUN: 13 mg/dL (ref 6–23)
CALCIUM: 9.7 mg/dL (ref 8.4–10.5)
CHLORIDE: 108 meq/L (ref 96–112)
CO2: 16 meq/L — AB (ref 19–32)
Creatinine, Ser: 1.62 mg/dL — ABNORMAL HIGH (ref 0.50–1.35)
GFR calc Af Amer: 51 mL/min — ABNORMAL LOW (ref >90)
GFR, EST NON AFRICAN AMERICAN: 44 mL/min — AB (ref >90)
Glucose, Bld: 93 mg/dL (ref 70–99)
POTASSIUM: 3.7 meq/L (ref 3.7–5.3)
SODIUM: 142 meq/L (ref 137–147)
Total Bilirubin: 1 mg/dL (ref 0.3–1.2)
Total Protein: 8.3 g/dL (ref 6.0–8.3)

## 2014-07-31 LAB — CBC WITH DIFFERENTIAL/PLATELET
Basophils Absolute: 0 10*3/uL (ref 0.0–0.1)
Basophils Relative: 0 % (ref 0–1)
EOS PCT: 1 % (ref 0–5)
Eosinophils Absolute: 0.1 10*3/uL (ref 0.0–0.7)
HCT: 37.9 % — ABNORMAL LOW (ref 39.0–52.0)
Hemoglobin: 13 g/dL (ref 13.0–17.0)
LYMPHS PCT: 23 % (ref 12–46)
Lymphs Abs: 1.3 10*3/uL (ref 0.7–4.0)
MCH: 30 pg (ref 26.0–34.0)
MCHC: 34.3 g/dL (ref 30.0–36.0)
MCV: 87.5 fL (ref 78.0–100.0)
Monocytes Absolute: 0.7 10*3/uL (ref 0.1–1.0)
Monocytes Relative: 12 % (ref 3–12)
NEUTROS ABS: 3.8 10*3/uL (ref 1.7–7.7)
Neutrophils Relative %: 64 % (ref 43–77)
PLATELETS: 130 10*3/uL — AB (ref 150–400)
RBC: 4.33 MIL/uL (ref 4.22–5.81)
RDW: 13.8 % (ref 11.5–15.5)
WBC: 5.9 10*3/uL (ref 4.0–10.5)

## 2014-07-31 LAB — URINALYSIS, ROUTINE W REFLEX MICROSCOPIC
Glucose, UA: NEGATIVE mg/dL
Ketones, ur: 15 mg/dL — AB
NITRITE: NEGATIVE
Protein, ur: >300 mg/dL — AB
SPECIFIC GRAVITY, URINE: 1.024 (ref 1.005–1.030)
UROBILINOGEN UA: 1 mg/dL (ref 0.0–1.0)
pH: 5.5 (ref 5.0–8.0)

## 2014-07-31 LAB — TROPONIN I: Troponin I: <0.3 ng/mL (ref <0.30)

## 2014-07-31 LAB — I-STAT ARTERIAL BLOOD GAS, ED
Acid-base deficit: 9 mmol/L — ABNORMAL HIGH (ref 0.0–2.0)
BICARBONATE: 15.2 meq/L — AB (ref 20.0–24.0)
O2 Saturation: 96 %
PH ART: 7.348 — AB (ref 7.350–7.450)
TCO2: 16 mmol/L (ref 0–100)
pCO2 arterial: 27.6 mmHg — ABNORMAL LOW (ref 35.0–45.0)
pO2, Arterial: 81 mmHg (ref 80.0–100.0)

## 2014-07-31 LAB — PROTIME-INR
INR: 3 — AB (ref 0.00–1.49)
Prothrombin Time: 31.4 seconds — ABNORMAL HIGH (ref 11.6–15.2)

## 2014-07-31 LAB — URINE MICROSCOPIC-ADD ON

## 2014-07-31 LAB — I-STAT CG4 LACTIC ACID, ED: LACTIC ACID, VENOUS: 1.55 mmol/L (ref 0.5–2.2)

## 2014-07-31 LAB — LIPASE, BLOOD: Lipase: 43 U/L (ref 11–59)

## 2014-07-31 MED ORDER — SODIUM CHLORIDE 0.9 % IV BOLUS (SEPSIS)
1000.0000 mL | Freq: Once | INTRAVENOUS | Status: AC
  Administered 2014-07-31: 1000 mL via INTRAVENOUS

## 2014-07-31 MED ORDER — SODIUM CHLORIDE 0.9 % IV SOLN
INTRAVENOUS | Status: DC
  Administered 2014-07-31: 12:00:00 via INTRAVENOUS

## 2014-07-31 MED ORDER — ONDANSETRON HCL 4 MG PO TABS: 4.0000 mg | ORAL_TABLET | Freq: Four times a day (QID) | ORAL | Status: DC | PRN

## 2014-07-31 MED ORDER — SODIUM CHLORIDE 0.9 % IJ SOLN
3.0000 mL | Freq: Two times a day (BID) | INTRAMUSCULAR | Status: DC
  Administered 2014-07-31 – 2014-08-01 (×2): 3 mL via INTRAVENOUS

## 2014-07-31 MED ORDER — SODIUM CHLORIDE 0.9 % IV SOLN
INTRAVENOUS | Status: DC
  Administered 2014-08-01 – 2014-08-03 (×7): via INTRAVENOUS

## 2014-07-31 MED ORDER — AMLODIPINE BESYLATE 10 MG PO TABS
10.0000 mg | ORAL_TABLET | Freq: Every day | ORAL | Status: DC
  Administered 2014-08-01 – 2014-08-03 (×2): 10 mg via ORAL
  Filled 2014-07-31 (×3): qty 1

## 2014-07-31 MED ORDER — BENZONATATE 100 MG PO CAPS
100.0000 mg | ORAL_CAPSULE | Freq: Two times a day (BID) | ORAL | Status: DC
  Administered 2014-07-31 – 2014-08-03 (×6): 100 mg via ORAL
  Filled 2014-07-31 (×7): qty 1

## 2014-07-31 MED ORDER — GUAIFENESIN ER 600 MG PO TB12
600.0000 mg | ORAL_TABLET | Freq: Two times a day (BID) | ORAL | Status: DC
  Administered 2014-07-31 – 2014-08-03 (×6): 600 mg via ORAL
  Filled 2014-07-31 (×7): qty 1

## 2014-07-31 MED ORDER — CLOPIDOGREL BISULFATE 75 MG PO TABS
75.0000 mg | ORAL_TABLET | Freq: Every day | ORAL | Status: DC
  Administered 2014-08-01 – 2014-08-02 (×2): 75 mg via ORAL
  Filled 2014-07-31 (×3): qty 1

## 2014-07-31 MED ORDER — SODIUM CHLORIDE 0.9 % IV SOLN
INTRAVENOUS | Status: AC
  Administered 2014-07-31: 16:00:00 via INTRAVENOUS

## 2014-07-31 MED ORDER — SODIUM CHLORIDE 0.9 % IV BOLUS (SEPSIS)
1000.0000 mL | Freq: Once | INTRAVENOUS | Status: AC
Start: 2014-07-31 — End: 2014-07-31
  Administered 2014-07-31: 1000 mL via INTRAVENOUS

## 2014-07-31 MED ORDER — ACETAMINOPHEN 650 MG RE SUPP: 650.0000 mg | Freq: Four times a day (QID) | RECTAL | Status: DC | PRN

## 2014-07-31 MED ORDER — NITROGLYCERIN 0.4 MG SL SUBL
0.4000 mg | SUBLINGUAL_TABLET | Freq: Once | SUBLINGUAL | Status: AC
  Administered 2014-07-31: 0.4 mg via SUBLINGUAL

## 2014-07-31 MED ORDER — PANTOPRAZOLE SODIUM 40 MG PO TBEC
40.0000 mg | DELAYED_RELEASE_TABLET | Freq: Every day | ORAL | Status: DC
  Administered 2014-08-01 – 2014-08-03 (×3): 40 mg via ORAL
  Filled 2014-07-31 (×3): qty 1

## 2014-07-31 MED ORDER — ACETAMINOPHEN 325 MG PO TABS: 650.0000 mg | ORAL_TABLET | Freq: Four times a day (QID) | ORAL | Status: DC | PRN

## 2014-07-31 MED ORDER — ROSUVASTATIN CALCIUM 40 MG PO TABS
40.0000 mg | ORAL_TABLET | Freq: Every day | ORAL | Status: DC
  Administered 2014-08-01 – 2014-08-02 (×2): 40 mg via ORAL
  Filled 2014-07-31 (×3): qty 1

## 2014-07-31 MED ORDER — ONDANSETRON HCL 4 MG/2ML IJ SOLN
4.0000 mg | Freq: Four times a day (QID) | INTRAMUSCULAR | Status: DC | PRN
  Administered 2014-08-01: 4 mg via INTRAVENOUS
  Filled 2014-07-31: qty 2

## 2014-07-31 MED ORDER — COLCHICINE 0.6 MG PO TABS
0.6000 mg | ORAL_TABLET | Freq: Two times a day (BID) | ORAL | Status: DC
  Administered 2014-07-31 – 2014-08-03 (×6): 0.6 mg via ORAL
  Filled 2014-07-31 (×7): qty 1

## 2014-07-31 NOTE — ED Notes (Signed)
Cough for 2 weeks after receiving the Influenza vaccine 2 weeks ago. Chest pain while sitting in Dr's. office today.

## 2014-07-31 NOTE — Progress Notes (Signed)
ANTICOAGULATION CONSULT NOTE - Initial Consult  Pharmacy Consult for warfarin Indication: atrial fibrillation  Allergies  Allergen Reactions  . Ketorolac Tromethamine     REACTION: hives    Patient Measurements: Height: 5' 6"  (167.6 cm) Weight: 156 lb (70.761 kg) IBW/kg (Calculated) : 63.8 Heparin Dosing Weight:   Vital Signs: Temp: 98.2 F (36.8 C) (11/20 1607) Temp Source: Oral (11/20 1118) BP: 136/83 mmHg (11/20 1607) Pulse Rate: 88 (11/20 1607)  Labs:  Recent Labs  07/31/14 1127  HGB 13.0  HCT 37.9*  PLT 130*  LABPROT 31.4*  INR 3.00*  CREATININE 1.62*  TROPONINI <0.30    Estimated Creatinine Clearance: 43.2 mL/min (by C-G formula based on Cr of 1.62).   Medical History: Past Medical History  Diagnosis Date  . History of cardiac cath   . Anemia   . Hypertension   . Gout   . Atrial fibrillation   . GERD (gastroesophageal reflux disease)   . Hyperlipidemia   . Herpes   . RHINITIS, ALLERGIC 11/08/2006  . Herpes genitalia 04/13/2011  . CAD (coronary artery disease)     Branch Vessel  . Tobacco abuse   . Alcohol abuse   . Cocaine abuse     Hx of  . BPH 11/08/2006    Medications:  Prescriptions prior to admission  Medication Sig Dispense Refill Last Dose  . amLODipine (NORVASC) 10 MG tablet TAKE 1 TABLET (10 MG TOTAL) BY MOUTH DAILY. 90 tablet 0 07/31/2014 at Unknown time  . clopidogrel (PLAVIX) 75 MG tablet TAKE 1 TABLET BY MOUTH EVERY DAY 30 tablet 5 07/31/2014 at Unknown time  . colchicine 0.6 MG tablet Take 1.7m now, then 0.678min 1 hour. Then wait 12 hours to resume 0.51m551mID. (Patient taking differently: Take 0.6 mg by mouth 2 (two) times daily. ) 180 tablet 3 07/31/2014 at Unknown time  . CRESTOR 40 MG tablet TAKE 1 TABLET (40 MG TOTAL) BY MOUTH DAILY. 90 tablet 0 07/30/2014 at Unknown time  . NEXIUM 40 MG capsule TAKE ONE CAPSULE BY MOUTH EVERY DAY 90 capsule 1 07/30/2014 at Unknown time  . nitroGLYCERIN (NITROSTAT) 0.4 MG SL tablet Place 1  tablet (0.4 mg total) under the tongue as directed. Place 1 tab under tongue as directed 25 tablet 6 unk  . ramipril (ALTACE) 10 MG capsule TAKE 1 CAPSULE (10 MG TOTAL) BY MOUTH 2 (TWO) TIMES DAILY. 180 capsule 0 07/31/2014 at Unknown time  . warfarin (COUMADIN) 5 MG tablet Take 5-7.5 mg by mouth daily. Take 5 mg daily except on Wednesday take 7.5  mg   07/31/2014 at Unknown time  . sildenafil (VIAGRA) 100 MG tablet Take 0.5-1 tablets (50-100 mg total) by mouth daily as needed for erectile dysfunction. (Patient not taking: Reported on 07/31/2014) 5 tablet 6 Taking  . warfarin (COUMADIN) 5 MG tablet TAKE AS DIRECTED BY ANTICOAGULATION CLINIC (Patient not taking: Reported on 07/31/2014) 40 tablet 3   . [DISCONTINUED] predniSONE (DELTASONE) 50 MG tablet Take 1 tablet (50 mg total) by mouth daily with breakfast. (Patient not taking: Reported on 07/31/2014) 5 tablet 0 no    Assessment: [age] male admitted with chest pain while at PCP office. Has a history of AFib and is on warfarin prior to admission. Current INR 3.  CBC stable.  PTA dose: 7.5 mg po on Wednesdays, 5 mg po on all other days  Goal of Therapy:  INR 2-3 Monitor platelets by anticoagulation protocol: Yes   Plan:  Pt took warfarin this  morning prior to going to outpatient MD office Hold warfarin tonight, resume tomorrow Daily INR  Hughes Better, PharmD, BCPS Clinical Pharmacist Pager: [phone] 07/31/2014 5:25 PM

## 2014-07-31 NOTE — Plan of Care (Signed)
Problem: Phase I Progression Outcomes Goal: Pain controlled with appropriate interventions Outcome: Not Applicable Date Met:  64/29/03 Goal: OOB as tolerated unless otherwise ordered Outcome: Completed/Met Date Met:  07/31/14

## 2014-07-31 NOTE — ED Notes (Signed)
Meal tray ordered for pt  

## 2014-07-31 NOTE — ED Notes (Signed)
Attempted report x1. 

## 2014-07-31 NOTE — H&P (Signed)
[facility] Admission History and Physical Service Pager: [phone]  Patient name: Jesse Franklin Medical record number: [id number] Date of birth: 09/27/1950 Age: 63 y.o. Gender: male  Primary Care Provider: Chrisandra Netters, MD Consultants: None Code Status: Full  Chief Complaint: Cough  Assessment and Plan: Jesse Franklin is a 63 y.o. male presenting with 2 weeks of diarrhea and cough. Also with chest pain. PMH is significant for HTN, HLD, CAD, Afib, and gout.   #Diarrhea/Dehydration. Etiology possibly viral infection, though would expect more frank emesis and shorter course of illness. C-diff is possible given patient's exposure to antibiotics, however diarrhea preceded exposure to antibiotics. Unlikely inflammatory diarrhea as patient has not had any blood or pus in stool. Patient appears significantly dehydrated on exam and has approximately 10 pound weight loss over the past 2 weeks.  -Will treat symptomatically with fluid resuscitation.  -c diff pcr pending -stool culture and O/P pending -Will obtain CK given patient's moderate HgB on UA with 0-2 RBCs on microscopy to rule out rhabdomyolysis  #AKI/Metabolic acidosis. Cr on admission 1.67, up from baseline of 1-1.2, most likely prerenal in setting of dehydration. Also with increased AG metabolic acidosis on admission (AG 18, ABG: 7.348/27.6/15.2). Metabolic acidosis possibly related to ketosis 2/2 dehydration and decreased PO intake. BUN and lactic acid levels within normal limits. No history of ingestions. -F/u BMP in the morning for improvement in AKI and acidosis -Can consider further work up for AKI including FeNa and renal US if not improving with fluids -UA not frankly suggestive of UTI and pt not complaining of urinary symptoms and without WBC, so doubt UTI and will thus hold off on any abx  #Chest pain. Heart score of 3. Risk factors include HTN, HLD, smoking history, and known CAD. Chest pain  today likely secondary to muscle tenderness due to coughing, though will proceed with ACS rule out given significant risk factors and cardiac history. Troponin negative in ED and EKG with stable Afib and no acute ischemic changes -Monitor on telemetry -Cycle troponins -EKG in the morning  #Cough. Most likely viral bronchitis, though would typically expect shorter course. Not likely PNA given clear CXR and normal WBC. Will treat symptomatically. Doubt influenza and outside window of benefit from Tamiflu, regardless -Tessalon, mucinex  #HTN. BP well controlled here. Holding home ramipril in setting of AKI, will restart as clinically indicated -Continue home amlodipine 45m daily  #CAD/HLD. S/p cath in 2005 (LAD obstructive disease).  -Continue home crestor and plavix  #AFib. Rate controlled here. -Continue home warfarin dosing per pharmacy  #Gout. Recently completed course of prednisone for acute flare - Continue home colchicine  FEN/GI: Clear liquid diet, ADAT, NS@125cc /hr Prophylaxis: On coumadin  Disposition: Admitted to telemetry under Attending Dr EGwendlyn Deutscherpending management of problems listed above.   History of Present Illness: Jesse MACNAUGHTONis a 64y.o. male presenting with 2 weeks of worsening cough and diarrhea. Patient states that his symptoms started after receiving the flu vaccine. He took a few doses of antibiotics from a friend for his cough, however reports that his diarrhea started before. Has been having 3-4 bouts of watery diarrhea for the past 2 weeks. No hematocheszia. Also reports numerous bouts "vomiting" that occur after a coughing fit. No hematemesis or hemoptysis. Reports some intermittent fevers and chills. Also with associated chest wall and abdominal wall pain. Patient reports that he has been unable to keep much down and has lost approximately 10 pounds in the last 2  weeks. Denies any sick contacts or recent travel.   Denies recent alcohol use or other  ingestions.   Patient presented to clinic today and developed left sided chest pain that radiated to his left shoulder while waiting for the visit. Patient was then sent to the ED for further evaluation. Patient has no chest pain currently.   In the ED, patient's chest pain resolved with ASA and nitroglycerin. EKG showed stable afib with no acute signs of ischemia. Troponin was negative. Other labs were remarkable for an increased AG metabolic acidosis and AKI (Cr 1.62.)  Review Of Systems: Per HPI, otherwise 12 point review of systems was performed and was unremarkable.  Patient Active Problem List   Diagnosis Date Noted  . Anginal chest pain at rest 07/31/2014  . Metabolic acidosis 16/06/9603  . Pulmonary hypertension 12/02/2013  . Encounter for therapeutic drug monitoring 11/20/2013  . Hx of long term use of blood thinners 01/11/2013  . Influenza vaccination declined 05/24/2012  . Olecranon bursitis of left elbow 03/18/2012  . Erectile dysfunction 08/25/2011  . Knee pain, bilateral 05/04/2011  . Herpes genitalia 04/13/2011  . Unspecified deficiency anemia 05/31/2010  . Gout 04/14/2009  . HYPERTENSION, BENIGN ESSENTIAL 12/11/2006  . HYPERCHOLESTEROLEMIA 11/08/2006  . SCHIZOPHRENIA 11/08/2006  . DEPRESSION, MAJOR, RECURRENT 11/08/2006  . TOBACCO DEPENDENCE 11/08/2006  . CORONARY, ARTERIOSCLEROSIS 11/08/2006  . Atrial fibrillation 11/08/2006  . GASTROESOPHAGEAL REFLUX, NO ESOPHAGITIS 11/08/2006  . BPH 11/08/2006   Past Medical History: Past Medical History  Diagnosis Date  . History of cardiac cath   . Anemia   . Hypertension   . Gout   . Atrial fibrillation   . GERD (gastroesophageal reflux disease)   . Hyperlipidemia   . Herpes   . RHINITIS, ALLERGIC 11/08/2006  . Herpes genitalia 04/13/2011  . CAD (coronary artery disease)     Branch Vessel  . Tobacco abuse   . Alcohol abuse   . Cocaine abuse     Hx of  . BPH 11/08/2006   Past Surgical History: Past Surgical  History  Procedure Laterality Date  . Right hand surgery    . Cardiac catheterization     Social History: History  Substance Use Topics  . Smoking status: Current Every Day Smoker -- 0.30 packs/day    Types: Cigarettes  . Smokeless tobacco: Former Systems developer    Types: Chew    Quit date: 09/12/1967     Comment: Chewed Red Man Tobacco  . Alcohol Use: Yes     Comment: wine once a week    Additional social history: Endorses marijuana use Please also refer to relevant sections of EMR.  Family History: Family History  Problem Relation Age of Onset  . Alcohol abuse Father   . Heart disease Brother   . Alcohol abuse Brother   . Cancer Sister   . Hypertension Mother   . Gout Mother    Allergies and Medications: Allergies  Allergen Reactions  . Ketorolac Tromethamine     REACTION: hives   No current facility-administered medications on file prior to encounter.   Current Outpatient Prescriptions on File Prior to Encounter  Medication Sig Dispense Refill  . amLODipine (NORVASC) 10 MG tablet TAKE 1 TABLET (10 MG TOTAL) BY MOUTH DAILY. 90 tablet 0  . clopidogrel (PLAVIX) 75 MG tablet TAKE 1 TABLET BY MOUTH EVERY DAY 30 tablet 5  . colchicine 0.6 MG tablet Take 1.164m now, then 0.638min 1 hour. Then wait 12 hours to resume 0.64m61mID. (Patient  taking differently: Take 0.6 mg by mouth 2 (two) times daily. ) 180 tablet 3  . CRESTOR 40 MG tablet TAKE 1 TABLET (40 MG TOTAL) BY MOUTH DAILY. 90 tablet 0  . NEXIUM 40 MG capsule TAKE ONE CAPSULE BY MOUTH EVERY DAY 90 capsule 1  . nitroGLYCERIN (NITROSTAT) 0.4 MG SL tablet Place 1 tablet (0.4 mg total) under the tongue as directed. Place 1 tab under tongue as directed 25 tablet 6  . ramipril (ALTACE) 10 MG capsule TAKE 1 CAPSULE (10 MG TOTAL) BY MOUTH 2 (TWO) TIMES DAILY. 180 capsule 0  . predniSONE (DELTASONE) 50 MG tablet Take 1 tablet (50 mg total) by mouth daily with breakfast. (Patient not taking: Reported on 07/31/2014) 5 tablet 0  . sildenafil  (VIAGRA) 100 MG tablet Take 0.5-1 tablets (50-100 mg total) by mouth daily as needed for erectile dysfunction. (Patient not taking: Reported on 07/31/2014) 5 tablet 6  . warfarin (COUMADIN) 5 MG tablet TAKE AS DIRECTED BY ANTICOAGULATION CLINIC (Patient not taking: Reported on 07/31/2014) 40 tablet 3    Objective: BP 117/85 mmHg  Pulse 49  Temp(Src) 98.6 F (37 C) (Oral)  Resp 15  Ht 5' 6"  (1.676 m)  Wt 156 lb (70.761 kg)  BMI 25.19 kg/m2  SpO2 100% Exam: General: NAD, older manlying in bed, speaking in full sentences, active loud coughing throughout exam HEENT: EOMI, dry appearing mucus membranes Cardiovascular: Bradycardic, irregularly irregular, radial pulses 2+ bilaterally Respiratory: NWOB, upper airway sounds appreciated diffusely, no wheezes or crackles Abdomen: +BS, soft, abdominal wall diffusely tender to palpation, nondistended Extremities: No cyanosis or edema Skin: No rash Neuro: Alert, moves all extremities spontaneously, no gross focal deficits.   Labs and Imaging: CBC BMET   Recent Labs Lab 07/31/14 1127  WBC 5.9  HGB 13.0  HCT 37.9*  PLT 130*    Recent Labs Lab 07/31/14 1127  NA 142  K 3.7  CL 108  CO2 16*  BUN 13  CREATININE 1.62*  GLUCOSE 93  CALCIUM 9.7     ABG    Component Value Date/Time   PHART 7.348* 07/31/2014 1322   PCO2ART 27.6* 07/31/2014 1322   PO2ART 81.0 07/31/2014 1322   HCO3 15.2* 07/31/2014 1322   TCO2 16 07/31/2014 1322   ACIDBASEDEF 9.0* 07/31/2014 1322   O2SAT 96.0 07/31/2014 1322   Urinalysis    Component Value Date/Time   COLORURINE AMBER* 07/31/2014 1223   APPEARANCEUR CLOUDY* 07/31/2014 1223   LABSPEC 1.024 07/31/2014 1223   PHURINE 5.5 07/31/2014 1223   GLUCOSEU NEGATIVE 07/31/2014 1223   HGBUR MODERATE* 07/31/2014 1223   HGBUR trace-intact 02/19/2007 1427   BILIRUBINUR SMALL* 07/31/2014 1223   KETONESUR 15* 07/31/2014 1223   PROTEINUR >300* 07/31/2014 1223   UROBILINOGEN 1.0 07/31/2014 1223   NITRITE  NEGATIVE 07/31/2014 1223   LEUKOCYTESUR SMALL* 07/31/2014 1223   Lipase 43 Troponin negative Lactic Acid 1.55 INR 3.00 EKG: Stable atrial fibrillation with no acute ischemic changes  Imaging   Dg Abd Acute W/chest  07/31/2014   CLINICAL DATA:  Productive cough and fever. Chest pain. Abdominal shortness. Diarrhea. Vomiting.  EXAM: ACUTE ABDOMEN SERIES (ABDOMEN 2 VIEW & CHEST 1 VIEW)  COMPARISON:  Chest x-ray dated 08/29/2004 and abdominal radiographs dated 02/01/2005  FINDINGS: There is no evidence of dilated bowel loops or free intraperitoneal air. No radiopaque calculi or other significant radiographic abnormality is seen. Heart size and mediastinal contours are within normal limits. Both lungs are clear.  IMPRESSION: Benign appearing abdomen and chest.  Electronically Signed   By: Rozetta Nunnery M.D.   On: 07/31/2014 12:18    Dimas Chyle, MD 07/31/2014, 3:39 PM PGY-1, [place] Intern pager: [phone], text pages welcome  FPTS Upper-Level Resident Addendum  I have independently interviewed and examined the patient. I have discussed the above with Dr. Jerline Pain and agree with his documentation as above. The above reflects his original note with my edits for correction/additions/clarification in orange. Please see also any attending notes.   Emmaline Kluver, MD PGY-3, [place] Service pager: [phone] (text pages welcome through [facility])

## 2014-07-31 NOTE — ED Provider Notes (Signed)
CSN: [id number]     Arrival date & time 07/31/14  1113 History   First MD Initiated Contact with Patient 07/31/14 1118     Chief Complaint  Patient presents with  . Chest Pain  . Cough     (Consider location/radiation/quality/duration/timing/severity/associated sxs/prior Treatment) HPI Comments: Patient from PCPs office with episode of chest pain that is now resolved after receiving aspirin and nitroglycerin. He was being seen for 2 week history of cough productive of white and green mucus associated with nausea, vomiting, abdominal pain and diarrhea. Denies any fever. Denies any shortness of breath. Denies any focal weakness, numbness or tingling. Denies any bowel or bladder incontinence. He denies seeing any blood in his stool. He has a history of atrial fibrillation and is on Coumadin. He denies any recent cocaine or Viagra use. His chest pain is not resolved. He denies any sick contacts. He states he's been sick since he got the flu shot 2 weeks ago.  The history is provided by the patient and the EMS personnel.    Past Medical History  Diagnosis Date  . History of cardiac cath   . Anemia   . Hypertension   . Gout   . Atrial fibrillation   . GERD (gastroesophageal reflux disease)   . Hyperlipidemia   . Herpes   . RHINITIS, ALLERGIC 11/08/2006  . Herpes genitalia 04/13/2011  . CAD (coronary artery disease)     Branch Vessel  . Tobacco abuse   . Alcohol abuse   . Cocaine abuse     Hx of  . BPH 11/08/2006   Past Surgical History  Procedure Laterality Date  . Right hand surgery    . Cardiac catheterization     Family History  Problem Relation Age of Onset  . Alcohol abuse Father   . Heart disease Brother   . Alcohol abuse Brother   . Cancer Sister   . Hypertension Mother   . Gout Mother    History  Substance Use Topics  . Smoking status: Current Every Day Smoker -- 0.30 packs/day    Types: Cigarettes  . Smokeless tobacco: Former Systems developer    Types: Chew    Quit date:  09/12/1967     Comment: Chewed Red Man Tobacco  . Alcohol Use: Yes     Comment: wine once a week     Review of Systems  Constitutional: Positive for activity change and appetite change. Negative for fever.  HENT: Negative for congestion and rhinorrhea.   Respiratory: Positive for cough, chest tightness and shortness of breath.   Cardiovascular: Positive for chest pain.  Gastrointestinal: Positive for nausea, vomiting, abdominal pain and diarrhea.  Genitourinary: Negative for dysuria, urgency, hematuria and testicular pain.  Musculoskeletal: Positive for myalgias and arthralgias.  Skin: Negative for rash.  Neurological: Positive for weakness. Negative for headaches.  A complete 10 system review of systems was obtained and all systems are negative except as noted in the HPI and PMH.      Allergies  Ketorolac tromethamine  Home Medications   Prior to Admission medications   Medication Sig Start Date End Date Taking? Authorizing Provider  amLODipine (NORVASC) 10 MG tablet TAKE 1 TABLET (10 MG TOTAL) BY MOUTH DAILY. 05/21/14  Yes Leeanne Rio, MD  clopidogrel (PLAVIX) 75 MG tablet TAKE 1 TABLET BY MOUTH EVERY DAY 07/22/14  Yes Thayer Headings, MD  colchicine 0.6 MG tablet Take 1.4m now, then 0.659min 1 hour. Then wait 12 hours to resume 0.11m52m  BID. Patient taking differently: Take 0.6 mg by mouth 2 (two) times daily.  07/08/14  Yes Leeanne Rio, MD  CRESTOR 40 MG tablet TAKE 1 TABLET (40 MG TOTAL) BY MOUTH DAILY. 06/12/14  Yes Leeanne Rio, MD  NEXIUM 40 MG capsule TAKE ONE CAPSULE BY MOUTH EVERY DAY   Yes Leeanne Rio, MD  nitroGLYCERIN (NITROSTAT) 0.4 MG SL tablet Place 1 tablet (0.4 mg total) under the tongue as directed. Place 1 tab under tongue as directed 02/20/12  Yes Thayer Headings, MD  ramipril (ALTACE) 10 MG capsule TAKE 1 CAPSULE (10 MG TOTAL) BY MOUTH 2 (TWO) TIMES DAILY. 06/12/14  Yes Leeanne Rio, MD  warfarin (COUMADIN) 5 MG tablet Take  5-7.5 mg by mouth daily. Take 5 mg daily except on Wednesday take 7.5  mg   Yes Historical Provider, MD  sildenafil (VIAGRA) 100 MG tablet Take 0.5-1 tablets (50-100 mg total) by mouth daily as needed for erectile dysfunction. Patient not taking: Reported on 07/31/2014 11/08/11 12/08/11  Donnal Moat, MD  warfarin (COUMADIN) 5 MG tablet TAKE AS DIRECTED BY ANTICOAGULATION CLINIC Patient not taking: Reported on 07/31/2014    Thayer Headings, MD   BP 136/83 mmHg  Pulse 88  Temp(Src) 98.2 F (36.8 C) (Oral)  Resp 18  Ht 5' 6"  (1.676 m)  Wt 156 lb (70.761 kg)  BMI 25.19 kg/m2  SpO2 100% Physical Exam  Constitutional: He is oriented to person, place, and time. He appears well-developed and well-nourished. No distress.  HENT:  Head: Normocephalic and atraumatic.  Mouth/Throat: Oropharynx is clear and moist. No oropharyngeal exudate.  Eyes: Conjunctivae and EOM are normal. Pupils are equal, round, and reactive to light.  Neck: Normal range of motion. Neck supple.  No meningismus.  Cardiovascular: Normal rate, normal heart sounds and intact distal pulses.   No murmur heard. Irregular rhythm  Pulmonary/Chest: Effort normal and breath sounds normal. No respiratory distress. He exhibits no tenderness.  Abdominal: Soft. There is tenderness. There is no rebound and no guarding.  Diffuse tenderness  Musculoskeletal: Normal range of motion. He exhibits no edema or tenderness.  Equal pulses and grip strengths  Neurological: He is alert and oriented to person, place, and time. No cranial nerve deficit. He exhibits normal muscle tone. Coordination normal.  No ataxia on finger to nose bilaterally. No pronator drift. 5/5 strength throughout. CN 2-12 intact. Negative Romberg. Equal grip strength. Sensation intact. Gait is normal.   Skin: Skin is warm.  Psychiatric: He has a normal mood and affect. His behavior is normal.  Nursing note and vitals reviewed.   ED Course  Procedures (including  critical care time) Labs Review Labs Reviewed  CBC WITH DIFFERENTIAL - Abnormal; Notable for the following:    HCT 37.9 (*)    Platelets 130 (*)    All other components within normal limits  COMPREHENSIVE METABOLIC PANEL - Abnormal; Notable for the following:    CO2 16 (*)    Creatinine, Ser 1.62 (*)    GFR calc non Af Amer 44 (*)    GFR calc Af Amer 51 (*)    Anion gap 18 (*)    All other components within normal limits  PROTIME-INR - Abnormal; Notable for the following:    Prothrombin Time 31.4 (*)    INR 3.00 (*)    All other components within normal limits  URINALYSIS, ROUTINE W REFLEX MICROSCOPIC - Abnormal; Notable for the following:    Color, Urine AMBER (*)  APPearance CLOUDY (*)    Hgb urine dipstick MODERATE (*)    Bilirubin Urine SMALL (*)    Ketones, ur 15 (*)    Protein, ur >300 (*)    Leukocytes, UA SMALL (*)    All other components within normal limits  URINE MICROSCOPIC-ADD ON - Abnormal; Notable for the following:    Squamous Epithelial / LPF FEW (*)    Bacteria, UA FEW (*)    Casts GRANULAR CAST (*)    All other components within normal limits  I-STAT ARTERIAL BLOOD GAS, ED - Abnormal; Notable for the following:    pH, Arterial 7.348 (*)    pCO2 arterial 27.6 (*)    Bicarbonate 15.2 (*)    Acid-base deficit 9.0 (*)    All other components within normal limits  STOOL CULTURE  OVA AND PARASITE EXAMINATION  CLOSTRIDIUM DIFFICILE BY PCR  CLOSTRIDIUM DIFFICILE BY PCR  TROPONIN I  LIPASE, BLOOD  TROPONIN I  BASIC METABOLIC PANEL  CBC  TROPONIN I  TROPONIN I  PROTIME-INR  CK  I-STAT CG4 LACTIC ACID, ED    Imaging Review Dg Abd Acute W/chest  07/31/2014   CLINICAL DATA:  Productive cough and fever. Chest pain. Abdominal shortness. Diarrhea. Vomiting.  EXAM: ACUTE ABDOMEN SERIES (ABDOMEN 2 VIEW & CHEST 1 VIEW)  COMPARISON:  Chest x-ray dated 08/29/2004 and abdominal radiographs dated 02/01/2005  FINDINGS: There is no evidence of dilated bowel loops  or free intraperitoneal air. No radiopaque calculi or other significant radiographic abnormality is seen. Heart size and mediastinal contours are within normal limits. Both lungs are clear.  IMPRESSION: Benign appearing abdomen and chest.   Electronically Signed   By: Rozetta Nunnery M.D.   On: 07/31/2014 12:18     EKG Interpretation   Date/Time:  Friday July 31 2014 11:17:24 EST Ventricular Rate:  80 PR Interval:    QRS Duration: 94 QT Interval:  413 QTC Calculation: 476 R Axis:   45 Text Interpretation:  Atrial fibrillation Ventricular bigeminy Repol abnrm  suggests ischemia, diffuse leads T  waver inversions laterally Confirmed  by Wyvonnia Dusky  MD, Seema Blum [phone]) on 07/31/2014 11:30:39 AM      MDM   Final diagnoses:  Chest pain  Metabolic acidosis  Nausea and vomiting, vomiting of unspecified type   Episode of chest pain that onset at the doctor's office now resolved after aspirin and nitroglycerin. 2 week history of cough, congestion, posttussive emesis and diarrhea.  EKG atrial fibrillation with bigeminy and unchanged lateral T-wave inversions Troponin negative. INR therapeutic.  Anion gap acidosis with bicarb 16.  Suspect dehydration from diarrhea and vomiting.  IVF given.  Chest pain appears atypical and likely from coughing.  Emesis appears to be posttussive.  Admit to family medicine for ongoing fluid resuscitation and correction of acidosis as well as cycling of troponins.  Ezequiel Essex, MD 07/31/14 1910

## 2014-07-31 NOTE — Progress Notes (Signed)
   Subjective:    Patient ID: Jesse Franklin, male    DOB: August 28, 1951, 63 y.o.   MRN: [id number]  HPI  Patient presents for SDA for 2 weeks' cough and congestion, develops chest pain, left sided while waiting for visit.  Pain initially 7/10 when onset, worsens during course of visit, radiating to L shoulder blade to point of bringing tears to eyes. Reports nausea, has diaphoresis. Patient room air pulse ox 9%, P48 irreg irreg, BP 124/74 manual right arm.   Patient denies use of Viagra; no cocaine use recently (reports use in distant past). Smokes cigarettes but unable to smoke THC or tobacco in the past 2 weeks due to cough.     Review of Systems     Objective:   Physical Exam In moderate to severe distress. Markedly improved 5 min after taking sublingual NTG HEENT neck supple, no cervical adenopathy.  COR irreg irregular, no RVR PULM Clear bilaterally, no rales or wheezes noted.  ABD Soft, nontender       Assessment & Plan:

## 2014-07-31 NOTE — Addendum Note (Signed)
Addended by: Burna Forts A on: 07/31/2014 05:23 PM   Modules accepted: Orders

## 2014-07-31 NOTE — ED Notes (Signed)
MD at bedside. Dr. Rancour 

## 2014-08-01 DIAGNOSIS — I4891 Unspecified atrial fibrillation: Secondary | ICD-10-CM

## 2014-08-01 DIAGNOSIS — R197 Diarrhea, unspecified: Secondary | ICD-10-CM | POA: Insufficient documentation

## 2014-08-01 DIAGNOSIS — I10 Essential (primary) hypertension: Secondary | ICD-10-CM

## 2014-08-01 LAB — BASIC METABOLIC PANEL
Anion gap: 17 — ABNORMAL HIGH (ref 5–15)
BUN: 8 mg/dL (ref 6–23)
CO2: 16 mEq/L — ABNORMAL LOW (ref 19–32)
Calcium: 8.6 mg/dL (ref 8.4–10.5)
Chloride: 112 mEq/L (ref 96–112)
Creatinine, Ser: 1.18 mg/dL (ref 0.50–1.35)
GFR, EST AFRICAN AMERICAN: 75 mL/min — AB (ref >90)
GFR, EST NON AFRICAN AMERICAN: 65 mL/min — AB (ref >90)
Glucose, Bld: 85 mg/dL (ref 70–99)
POTASSIUM: 3.3 meq/L — AB (ref 3.7–5.3)
Sodium: 145 mEq/L (ref 137–147)

## 2014-08-01 LAB — CBC
HCT: 31.8 % — ABNORMAL LOW (ref 39.0–52.0)
Hemoglobin: 11 g/dL — ABNORMAL LOW (ref 13.0–17.0)
MCH: 30.1 pg (ref 26.0–34.0)
MCHC: 34.6 g/dL (ref 30.0–36.0)
MCV: 87.1 fL (ref 78.0–100.0)
PLATELETS: 121 10*3/uL — AB (ref 150–400)
RBC: 3.65 MIL/uL — ABNORMAL LOW (ref 4.22–5.81)
RDW: 13.6 % (ref 11.5–15.5)
WBC: 4.5 10*3/uL (ref 4.0–10.5)

## 2014-08-01 LAB — CLOSTRIDIUM DIFFICILE BY PCR: Toxigenic C. Difficile by PCR: NEGATIVE

## 2014-08-01 LAB — PROTIME-INR
INR: 3.52 — ABNORMAL HIGH (ref 0.00–1.49)
Prothrombin Time: 35.5 seconds — ABNORMAL HIGH (ref 11.6–15.2)

## 2014-08-01 LAB — TROPONIN I
Troponin I: <0.3 ng/mL (ref <0.30)
Troponin I: <0.3 ng/mL (ref <0.30)

## 2014-08-01 LAB — CK: Total CK: 347 U/L — ABNORMAL HIGH (ref 7–232)

## 2014-08-01 MED ORDER — POTASSIUM CHLORIDE CRYS ER 20 MEQ PO TBCR
20.0000 meq | EXTENDED_RELEASE_TABLET | Freq: Two times a day (BID) | ORAL | Status: AC
  Administered 2014-08-01 (×2): 20 meq via ORAL
  Filled 2014-08-01 (×2): qty 1

## 2014-08-01 MED ORDER — WARFARIN - PHYSICIAN DOSING INPATIENT: Freq: Every day | Status: DC

## 2014-08-01 NOTE — Progress Notes (Signed)
ANTICOAGULATION CONSULT NOTE - Follow Up Consult  Pharmacy Consult for warfarin Indication: atrial fibrillation  Allergies  Allergen Reactions  . Ketorolac Tromethamine     REACTION: hives    Patient Measurements: Height: 5' 6"  (167.6 cm) Weight: 156 lb (70.761 kg) IBW/kg (Calculated) : 63.8  Vital Signs: Temp: 98.7 F (37.1 C) (11/21 0530) Temp Source: Oral (11/21 0530) BP: 120/94 mmHg (11/21 0900) Pulse Rate: 94 (11/21 0530)  Labs:  Recent Labs  07/31/14 1127 07/31/14 1809 07/31/14 2348 08/01/14 0443  HGB 13.0  --   --  11.0*  HCT 37.9*  --   --  31.8*  PLT 130*  --   --  121*  LABPROT 31.4*  --   --  35.5*  INR 3.00*  --   --  3.52*  CREATININE 1.62*  --   --  1.18  CKTOTAL  --   --  347*  --   TROPONINI <0.30 <0.30 <0.30 <0.30    Estimated Creatinine Clearance: 59.3 mL/min (by C-G formula based on Cr of 1.18).   Medications:  Scheduled:  . amLODipine  10 mg Oral Daily  . benzonatate  100 mg Oral BID  . clopidogrel  75 mg Oral Daily  . colchicine  0.6 mg Oral BID  . guaiFENesin  600 mg Oral BID  . pantoprazole  40 mg Oral Daily  . potassium chloride  20 mEq Oral BID  . rosuvastatin  40 mg Oral Daily  . sodium chloride  3 mL Intravenous Q12H    Assessment: 63 yo M admitted with chest pain while at PCP. Has a history of AFib, on chronic warfarin therapy. INR on admission 3.0, now supratherapeutic at 3.52. Hgb/Hct/ down slightly. No bleeding noted.   Goal of Therapy:  INR 2-3 Monitor platelets by anticoagulation protocol: Yes   Plan:  Hold warfarin tonight Daily INR Monitor s/sx of bleeding  Thank you for allowing pharmacy to be part of this patient's care team  Camp Gopal M. Londin Antone, Pharm.D Clinical Pharmacy Resident Pager: [phone] 08/01/2014 .12:42 PM

## 2014-08-01 NOTE — Plan of Care (Signed)
Problem: Phase I Progression Outcomes Goal: Initial discharge plan identified Outcome: Completed/Met Date Met:  08/01/14  Problem: Phase II Progression Outcomes Goal: Progress activity as tolerated unless otherwise ordered Outcome: Progressing Goal: Discharge plan established Outcome: Completed/Met Date Met:  08/01/14 Goal: Vital signs remain stable Outcome: Progressing  Problem: Phase III Progression Outcomes Goal: Voiding independently Outcome: Completed/Met Date Met:  08/01/14

## 2014-08-01 NOTE — Progress Notes (Signed)
Family Medicine Teaching Service Daily Progress Note Intern Pager: [phone]  Patient name: Jesse Franklin Medical record number: [id number] Date of birth: [birth year] Age: 63 y.o. Gender: male  Primary Care Provider: Chrisandra Netters, MD Consultants: none Code Status: FULL  Pt Overview and Major Events to Date:  11/20: admitted, anion gap 18, Cr 1.6, CK 347 06-Aug-2023: Cr improved, gap still 17 but otherwise well-appearing, still on IVF  Assessment and Plan: Jesse Franklin is a 63 y.o. male presenting with 2 weeks of diarrhea and cough. Also with chest pain. PMH is significant for HTN, HLD, CAD, Afib, and gout.   #Diarrhea/Dehydration with mild rhabdomyolysis . Etiology possibly viral in nature (URI +/- gastroenteritis). C-diff pending, though unlikely due to improving diarrhea. -continue IVF and advance diet as tolerated -f/u stool studies and C.diff -repeat CBC, BMP, lactic acid, and CK 11/22 for trend --> if improving, KVO fluids or saline lock  #AKI/Metabolic acidosis. Improving Cr, down from 1.67 on admission (baseline of 1-1.2), most likely prerenal due to the above - Also with increased AG metabolic acidosis on admission (AG 18, ABG: 1.497/02.6/37.8) of uncertain etiology, possibly chronic (gap was 13 a month ago) and/or related to diarrhea / dehydration -trend BMP, as above  #Chest pain. Heart score of 3. Risk factors include HTN, HLD, smoking history, and known CAD. Chest pain likely MSK / wall pain due to severe coughing, improving with treatment for cough. Troponins negative, EKG unchanged -Monitor on telemetry and clinically  #Cough. Most likely viral in nature, doubt  NA given clear CXR and normal WBC. Doubt influenza and outside window of benefit from Tamiflu, regardless. - Will treat symptomatically with Tessalon, Mucinex  #HTN. BP well controlled here. Holding home ramipril in setting of AKI, will restart as clinically indicated -Continue home amlodipine 8m  daily  #CAD/HLD. S/p cath in 2005 (LAD obstructive disease).  -Continue home Crestor and Pavix  #AFib. Rate controlled and EKG unchanged -Continue home warfarin dosing per pharmacy  #Gout. Recently completed course of prednisone for acute flare - Continue home colchicine  FEN/GI: heart-healthy diet, IVF as above PPx: on full anticoagulation and PPI  Disposition: Management as above. Possible D/C 11/22 pending continued improvement in clinical status and no increase in CK or Cr.  Subjective: Pt reports he generally feels better, still with significant cough, but abdominal and chest wall pain is improved, diarrhea is slowing. Does have some occasional hot flashes / chills immediately after coughing but does not feel subjectively feverish. Nausea improved and tolerating PO.  Objective: Temp:  [98.1 F (36.7 C)-98.7 F (37.1 C)] 98.7 F (37.1 C) (25-Nov-20240530) Pulse Rate:  [49-94] 94 (2024-11-250530) Resp:  [15-28] 16 (Nov 25, 20240530) BP: (111-136)/(70-99) 128/80 mmHg (2024/11/250530) SpO2:  [98 %-100 %] 100 % (11/25/240530) Weight:  [156 lb (70.761 kg)-156 lb 1.6 oz (70.806 kg)] 156 lb (70.761 kg) (11/20 1118) Physical Exam: General: adult male in NAD lying in bed, speaks in full sentences; cough less pronounced than 125-Nov-2024Cardiovascular: irregularly irregular, no murmur appreciated, radial pulses 2+ bilaterally Respiratory: generally CTAB with some transmitted upper airway sounds, normal WOB Abdomen: +BS, soft, abdominal wall remains diffusely tender but improved Extremities: No cyanosis or edema Skin: No rash  Laboratory:  Recent Labs Lab 07/31/14 1127 [id number]  WBC 5.9 4.5  HGB 13.0 11.0*  HCT 37.9* 31.8*  PLT 130* 121*    Recent Labs Lab 07/31/14 1127 111/25/150443  NA 142 145  K 3.7 3.3*  CL 108 112  CO2 16* 16*  BUN 13 8  CREATININE 1.62* 1.18  CALCIUM 9.7 8.6  PROT 8.3  --   BILITOT 1.0  --   ALKPHOS 95  --   ALT 31  --   AST 30  --   GLUCOSE 93 85     Recent Labs Lab 07/31/14 1127 07/31/14 1809 07/31/14 2348 08/01/14 0443  TROPONINI <0.30 <0.30 <0.30 <0.30   Lactic acid 11/20 @1316 : 1.55  UA 11/20 @1223 : small bilirubin, 15 ketones, >300 protein, small leukocytes, mod Hb but only 0-2 RBC/hpf  CK 11/20 @2348 : 347  Micro: C.diff PCR: PENDING Stool studies (culture, O&P) PENDING  Imaging/Diagnostic Tests: Acute abd series / CXR 11/20 @1213 : no acute cardiopulmonary process, benign abd gas pattern  ED EKG: Stable atrial fibrillation with no acute ischemic changes, couple PVC's Repeat EKG 11/21: stable, PVC  Emmaline Kluver, MD 08/01/2014, 9:55 AM PGY-3, [place] Intern pager: [phone], text pages welcome

## 2014-08-01 NOTE — Progress Notes (Addendum)
Nutrition Brief Note  Patient identified on the Malnutrition Screening Tool (MST) Report  Wt Readings from Last 15 Encounters:  07/31/14 156 lb (70.761 kg)  07/31/14 156 lb 1.6 oz (70.806 kg)  07/08/14 167 lb (75.751 kg)  01/20/14 165 lb 8 oz (75.07 kg)  12/02/13 164 lb 9.6 oz (74.662 kg)  09/17/13 166 lb (75.297 kg)  05/14/13 164 lb (74.39 kg)  04/18/13 162 lb (73.483 kg)  01/16/13 167 lb 11.2 oz (76.068 kg)  01/10/13 168 lb (76.204 kg)  12/02/12 173 lb 9.6 oz (78.744 kg)  11/04/12 171 lb (77.565 kg)  05/24/12 169 lb (76.658 kg)  04/03/12 168 lb (76.204 kg)  03/18/12 167 lb (75.751 kg)   Jesse Franklin is a 63 y.o. male presenting with 2 weeks of diarrhea and cough. Also with chest pain. PMH is significant for HTN, HLD, CAD, Afib, and gout.   Pt reports poor appetite for the past 1-2 weeks due to nausea, vomiting, and diarrhea. He confirms that his appetite has returned, as he ate 100% of his cheeseburger and potato chips for lunch this afternoon. He reports he lost approximately 11# PTA due to dehydration. He reports no nutrition concerns at this time. Nutrition focused physical exam revealed no signs of fat or muscle depletion.  Body mass index is 25.19 kg/(m^2). Patient meets criteria for overweight based on current BMI.   Current diet order is Heart Healthy, patient is consuming approximately 100% of meals at this time. Labs and medications reviewed.   No nutrition interventions warranted at this time. If nutrition issues arise, please consult RD.   Marella Vanderpol A. Jimmye Norman, RD, LDN Pager: [phone] After hours Pager: [phone]

## 2014-08-02 LAB — CBC
HEMATOCRIT: 30.9 % — AB (ref 39.0–52.0)
HEMOGLOBIN: 10.7 g/dL — AB (ref 13.0–17.0)
MCH: 30.1 pg (ref 26.0–34.0)
MCHC: 34.6 g/dL (ref 30.0–36.0)
MCV: 86.8 fL (ref 78.0–100.0)
Platelets: 142 10*3/uL — ABNORMAL LOW (ref 150–400)
RBC: 3.56 MIL/uL — AB (ref 4.22–5.81)
RDW: 13.6 % (ref 11.5–15.5)
WBC: 3.7 10*3/uL — ABNORMAL LOW (ref 4.0–10.5)

## 2014-08-02 LAB — BASIC METABOLIC PANEL
Anion gap: 15 (ref 5–15)
BUN: 8 mg/dL (ref 6–23)
CHLORIDE: 112 meq/L (ref 96–112)
CO2: 19 meq/L (ref 19–32)
Calcium: 8.5 mg/dL (ref 8.4–10.5)
Creatinine, Ser: 1.08 mg/dL (ref 0.50–1.35)
GFR calc non Af Amer: 72 mL/min — ABNORMAL LOW (ref >90)
GFR, EST AFRICAN AMERICAN: 84 mL/min — AB (ref >90)
Glucose, Bld: 88 mg/dL (ref 70–99)
Potassium: 3.4 mEq/L — ABNORMAL LOW (ref 3.7–5.3)
Sodium: 146 mEq/L (ref 137–147)

## 2014-08-02 LAB — PROTIME-INR
INR: 2.74 — ABNORMAL HIGH (ref 0.00–1.49)
Prothrombin Time: 29.2 seconds — ABNORMAL HIGH (ref 11.6–15.2)

## 2014-08-02 LAB — LACTIC ACID, PLASMA: LACTIC ACID, VENOUS: 0.9 mmol/L (ref 0.5–2.2)

## 2014-08-02 LAB — CK: Total CK: 445 U/L — ABNORMAL HIGH (ref 7–232)

## 2014-08-02 MED ORDER — WARFARIN SODIUM 5 MG PO TABS
5.0000 mg | ORAL_TABLET | Freq: Once | ORAL | Status: AC
  Administered 2014-08-02: 5 mg via ORAL
  Filled 2014-08-02: qty 1

## 2014-08-02 MED ORDER — POTASSIUM CHLORIDE CRYS ER 20 MEQ PO TBCR
40.0000 meq | EXTENDED_RELEASE_TABLET | Freq: Once | ORAL | Status: AC
  Administered 2014-08-02: 40 meq via ORAL
  Filled 2014-08-02: qty 2

## 2014-08-02 NOTE — Discharge Instructions (Signed)

## 2014-08-02 NOTE — Plan of Care (Signed)
Problem: Phase III Progression Outcomes Goal: Pain controlled on oral analgesia Outcome: Completed/Met Date Met:  08/02/14 Goal: Discharge plan remains appropriate-arrangements made Outcome: Completed/Met Date Met:  08/02/14  Problem: Discharge Progression Outcomes Goal: Discharge plan in place and appropriate Outcome: Completed/Met Date Met:  08/02/14

## 2014-08-02 NOTE — Progress Notes (Signed)
ANTICOAGULATION CONSULT NOTE - Follow Up Consult  Pharmacy Consult for warfarin Indication: atrial fibrillation  Allergies  Allergen Reactions  . Ketorolac Tromethamine     REACTION: hives    Patient Measurements: Height: 5' 6"  (167.6 cm) Weight: 156 lb (70.761 kg) IBW/kg (Calculated) : 63.8  Vital Signs: Temp: 98.1 F (36.7 C) (11/22 0638) Temp Source: Oral (11/22 3790) BP: 127/82 mmHg (11/22 2409) Pulse Rate: 52 (11/22 0638)  Labs:  Recent Labs  07/31/14 1127 07/31/14 1809 07/31/14 2348 08/01/14 0443 08/02/14 0515  HGB 13.0  --   --  11.0* 10.7*  HCT 37.9*  --   --  31.8* 30.9*  PLT 130*  --   --  121* 142*  LABPROT 31.4*  --   --  35.5* 29.2*  INR 3.00*  --   --  3.52* 2.74*  CREATININE 1.62*  --   --  1.18 1.08  CKTOTAL  --   --  347*  --  445*  TROPONINI <0.30 <0.30 <0.30 <0.30  --     Estimated Creatinine Clearance: 64.8 mL/min (by C-G formula based on Cr of 1.08).   Medications:  Scheduled:  . amLODipine  10 mg Oral Daily  . benzonatate  100 mg Oral BID  . clopidogrel  75 mg Oral Daily  . colchicine  0.6 mg Oral BID  . guaiFENesin  600 mg Oral BID  . pantoprazole  40 mg Oral Daily  . rosuvastatin  40 mg Oral Daily  . sodium chloride  3 mL Intravenous Q12H  . Warfarin - Physician Dosing Inpatient   Does not apply q1800    Assessment: 63 yo M admitted from PCP with chest pain. History of atrial fibrillation, on chronic warfarin therapy PTA. INR on admission 3.0, warfarin not given because patient already had his dose for the day. INR supratherapeutic on 11/21 at 3.52. Warfarin held. Today's INR therapeutic at 2.74. Hgb/Hct down slightly 10.7/30.9. PLTC up to 142. No bleeding noted.   Goal of Therapy:  INR 2-3 Monitor platelets by anticoagulation protocol: Yes   Plan: Warfarin 44m x 1 tonight Daily INR Monitor s/sx of bleeding  Thank you for allowing pharmacy to be part of this patient's care team  Soma Lizak M. Dakayla Disanti, Pharm.D Clinical Pharmacy  Resident Pager: [phone]/22/2015 .9:08 AM

## 2014-08-02 NOTE — Progress Notes (Signed)
Family Medicine Teaching Service Daily Progress Note Intern Pager: [phone]  Patient name: Jesse Franklin Medical record number: [id number] Date of birth: [birth year] Age: 63 y.o. Gender: male  Primary Care Provider: Chrisandra Netters, MD Consultants: none Code Status: FULL  Pt Overview and Major Events to Date:  11/20: admitted, anion gap 18, Cr 1.6, CK 347 11/21: Cr improved, gap still 17 but otherwise well-appearing, still on IVF  Assessment and Plan: Jesse Franklin is a 63 y.o. male presenting with 2 weeks of diarrhea and cough. Also with chest pain. PMH is significant for HTN, HLD, CAD, Afib, and gout.   #Diarrhea/Dehydration with mild rhabdomyolysis . Etiology possibly viral in nature (URI +/- gastroenteritis).  - Diarrhea Improving, rhabdo worsenig - Cdiff negative - continue IVF and advance diet as tolerated - f/u stool studies - CK trending up, continue IVF and dc statin  #AKI/Metabolic acidosis. - AKI resolved, likely pre-renal vs pigment induced - Met acidosis with gap - improving, unclear etiology - trend BMP  #Chest pain.  - Heart score of 3. Risk factors include HTN, HLD, smoking history, and known CAD.  - Chest pain likely MSK / wall pain due to severe coughing,  - ACS ruled out -Monitor on telemetry and clinically  #Cough.  -  improved - Most likely viral in nature, doubt  PNA given clear CXR and normal WBC. Doubt influenza and outside window of benefit from Tamiflu, regardless. - Will treat symptomatically with Tessalon, Mucinex  #HTN.  - Well controlled  - Holding home ramipril in setting of AKI, will restart as clinically indicated - Continue home amlodipine 47m daily  #CAD/HLD.  - S/p cath in 2005 (LAD obstructive disease).  - hold crestor as above, continue Plavix  #AFib.  - rate controlled -Continue home warfarin dosing per pharmacy  #Gout. Recently completed course of prednisone for acute flare - Continue home colchicine  FEN/GI:  heart-healthy diet, IVF as above PPx: on full anticoagulation and PPI  Disposition: Continue tele for now given uptrending CK and unexplained metabolic AG acidosis  Subjective:  Reports he is feeling better but still with cough, although it has improved.   Objective: Temp:  [98 F (36.7 C)-98.6 F (37 C)] 98.1 F (36.7 C) (11/22 08182 Pulse Rate:  [52-75] 52 (11/22 0638) Resp:  [17-20] 20 (11/22 09937 BP: (108-129)/(71-82) 108/73 mmHg (11/22 1003) SpO2:  [97 %-100 %] 97 % (11/22 01696 Physical Exam: Gen: NAD, alert, cooperative with exam, up walking in hallway HEENT: NCAT CV: RRR, good S1/S2, no murmur Resp: CTABL, no wheezes, non-labored, coughing intermittently Abd: SNTND, BS present, no guarding or organomegaly Ext: No edema, warm Neuro: Alert and oriented, No gross deficits   Laboratory:  Recent Labs Lab 07/31/14 1127 08/01/14 0443 08/02/14 0515  WBC 5.9 4.5 3.7*  HGB 13.0 11.0* 10.7*  HCT 37.9* 31.8* 30.9*  PLT 130* 121* 142*    Recent Labs Lab 07/31/14 1127 08/01/14 0443 08/02/14 0515  NA 142 145 146  K 3.7 3.3* 3.4*  CL 108 112 112  CO2 16* 16* 19  BUN _0 CREATININE 1.62* 1.18 1.08  CALCIUM 9.7 8.6 8.5  PROT 8.3  --   --   BILITOT 1.0  --   --   ALKPHOS 95  --   --   ALT 31  --   --   AST 30  --   --   GLUCOSE 93 85 88    Recent Labs Lab 07/31/14 1127 07/31/14 1809  07/31/14 2348 08/01/14 0443  TROPONINI <0.30 <0.30 <0.30 <0.30   Lactic acid 11/20 _0 : 1.55  UA 11/20 _1 : small bilirubin, 15 ketones, >300 protein, small leukocytes, mod Hb but only 0-2 RBC/hpf  CK 11/20 _2 : 347  Micro: C.diff PCR: PENDING Stool studies (culture, O&P) PENDING  Imaging/Diagnostic Tests: Acute abd series / CXR 11/20 _3 : no acute cardiopulmonary process, benign abd gas pattern  ED EKG: Stable atrial fibrillation with no acute ischemic changes, couple PVC's Repeat EKG 11/21: stable, PVC  Timmothy Euler, MD 08/02/2014, 10:59  AM PGY-3, [place] Intern pager: [phone], text pages welcome

## 2014-08-02 NOTE — Plan of Care (Signed)
Problem: Phase III Progression Outcomes Goal: Activity at appropriate level-compared to baseline (UP IN CHAIR FOR HEMODIALYSIS)  Outcome: Completed/Met Date Met:  08/02/14

## 2014-08-03 LAB — OVA AND PARASITE EXAMINATION: OVA AND PARASITES: NONE SEEN

## 2014-08-03 LAB — CBC
HCT: 30.8 % — ABNORMAL LOW (ref 39.0–52.0)
Hemoglobin: 10.7 g/dL — ABNORMAL LOW (ref 13.0–17.0)
MCH: 29.8 pg (ref 26.0–34.0)
MCHC: 34.7 g/dL (ref 30.0–36.0)
MCV: 85.8 fL (ref 78.0–100.0)
PLATELETS: 167 10*3/uL (ref 150–400)
RBC: 3.59 MIL/uL — AB (ref 4.22–5.81)
RDW: 13.4 % (ref 11.5–15.5)
WBC: 3.8 10*3/uL — ABNORMAL LOW (ref 4.0–10.5)

## 2014-08-03 LAB — BASIC METABOLIC PANEL
Anion gap: 17 — ABNORMAL HIGH (ref 5–15)
BUN: 6 mg/dL (ref 6–23)
CHLORIDE: 110 meq/L (ref 96–112)
CO2: 19 mEq/L (ref 19–32)
CREATININE: 1.03 mg/dL (ref 0.50–1.35)
Calcium: 8.3 mg/dL — ABNORMAL LOW (ref 8.4–10.5)
GFR calc Af Amer: 89 mL/min — ABNORMAL LOW (ref >90)
GFR, EST NON AFRICAN AMERICAN: 76 mL/min — AB (ref >90)
Glucose, Bld: 84 mg/dL (ref 70–99)
POTASSIUM: 3 meq/L — AB (ref 3.7–5.3)
Sodium: 146 mEq/L (ref 137–147)

## 2014-08-03 LAB — PROTIME-INR
INR: 1.83 — ABNORMAL HIGH (ref 0.00–1.49)
PROTHROMBIN TIME: 21.3 s — AB (ref 11.6–15.2)

## 2014-08-03 MED ORDER — WARFARIN - PHARMACIST DOSING INPATIENT: Freq: Every day | Status: DC

## 2014-08-03 MED ORDER — WARFARIN SODIUM 5 MG PO TABS: 5.0000 mg | ORAL_TABLET | Freq: Every day | ORAL | Status: DC

## 2014-08-03 MED ORDER — WHITE PETROLATUM GEL
Status: AC
  Administered 2014-08-03: 0.2
  Filled 2014-08-03: qty 5

## 2014-08-03 MED ORDER — SODIUM CHLORIDE 0.9 % IV SOLN
INTRAVENOUS | Status: DC
  Administered 2014-08-03: 10:00:00 via INTRAVENOUS

## 2014-08-03 MED ORDER — WARFARIN SODIUM 5 MG PO TABS
5.0000 mg | ORAL_TABLET | Freq: Once | ORAL | Status: DC
  Filled 2014-08-03: qty 1

## 2014-08-03 MED ORDER — BENZONATATE 100 MG PO CAPS: 100.0000 mg | ORAL_CAPSULE | Freq: Two times a day (BID) | ORAL | Status: DC

## 2014-08-03 MED ORDER — GUAIFENESIN ER 600 MG PO TB12: 600.0000 mg | ORAL_TABLET | Freq: Two times a day (BID) | ORAL | Status: DC

## 2014-08-03 MED ORDER — POTASSIUM CHLORIDE CRYS ER 20 MEQ PO TBCR
40.0000 meq | EXTENDED_RELEASE_TABLET | Freq: Two times a day (BID) | ORAL | Status: DC
  Administered 2014-08-03: 40 meq via ORAL
  Filled 2014-08-03: qty 2

## 2014-08-03 NOTE — Progress Notes (Signed)
ANTICOAGULATION CONSULT NOTE - Follow Up Consult  Pharmacy Consult for warfarin Indication: atrial fibrillation  Allergies  Allergen Reactions  . Ketorolac Tromethamine     REACTION: hives    Patient Measurements: Height: 5' 6"  (167.6 cm) Weight: 156 lb (70.761 kg) IBW/kg (Calculated) : 63.8  Vital Signs: Temp: 98.9 F (37.2 C) (11/23 0550) Temp Source: Oral (11/23 0550) BP: 127/81 mmHg (11/23 0550) Pulse Rate: 64 (11/23 0550)  Labs:  Recent Labs  07/31/14 1809 07/31/14 2348 08/01/14 0443 08/02/14 0515 08/03/14 0602  HGB  --   --  11.0* 10.7* 10.7*  HCT  --   --  31.8* 30.9* 30.8*  PLT  --   --  121* 142* 167  LABPROT  --   --  35.5* 29.2* 21.3*  INR  --   --  3.52* 2.74* 1.83*  CREATININE  --   --  1.18 1.08 1.03  CKTOTAL  --  347*  --  445*  --   TROPONINI <0.30 <0.30 <0.30  --   --     Estimated Creatinine Clearance: 68 mL/min (by C-G formula based on Cr of 1.03).   Medications:  Scheduled:  . amLODipine  10 mg Oral Daily  . benzonatate  100 mg Oral BID  . clopidogrel  75 mg Oral Daily  . colchicine  0.6 mg Oral BID  . guaiFENesin  600 mg Oral BID  . pantoprazole  40 mg Oral Daily  . sodium chloride  3 mL Intravenous Q12H  . warfarin  5 mg Oral ONCE-1800  . Warfarin - Physician Dosing Inpatient   Does not apply q1800    Assessment: 63 yo M admitted from PCP with chest pain. History of atrial fibrillation, on warfarin PTA. Warfarin initially held on admission due to supratherapeutic INR. iNR down to 2.74 yesterday therefore warfarin was resumed.  INR subtherapeutic at 1.83 today.   Goal of Therapy:  INR 2-3 Monitor platelets by anticoagulation protocol: Yes   Plan: Warfarin 5 mg po x 1  Daily INR Monitor s/sx of bleeding    Hughes Better, PharmD, BCPS Clinical Pharmacist Pager: [phone] 08/03/2014 8:46 AM

## 2014-08-03 NOTE — Progress Notes (Signed)
NURSING PROGRESS NOTE  Jesse Franklin [id number] Discharge Data: 08/03/2014 3:33 PM Attending Provider: Andrena Mews, MD GSU:[id number], [name], [name] to be D/C'd Home per MD order.  Discussed with the patient the After Visit Summary and all questions fully answered. All IV's discontinued with no bleeding noted. All belongings returned to patient for patient to take home.   Last Vital Signs:  Blood pressure 142/78, pulse 94, temperature 98.1 F (36.7 C), temperature source Oral, resp. rate 14, height 5' 6"  (1.676 m), weight 70.761 kg (156 lb), SpO2 100 %.  Discharge Medication List   Medication List    STOP taking these medications        clopidogrel 75 MG tablet  Commonly known as:  PLAVIX     CRESTOR 40 MG tablet  Generic drug:  rosuvastatin      TAKE these medications        amLODipine 10 MG tablet  Commonly known as:  NORVASC  TAKE 1 TABLET (10 MG TOTAL) BY MOUTH DAILY.     benzonatate 100 MG capsule  Commonly known as:  TESSALON  Take 1 capsule (100 mg total) by mouth 2 (two) times daily.     colchicine 0.6 MG tablet  Take 1.72m now, then 0.624min 1 hour. Then wait 12 hours to resume 0.4m63mID.     guaiFENesin 600 MG 12 hr tablet  Commonly known as:  MUCINEX  Take 1 tablet (600 mg total) by mouth 2 (two) times daily.     NEXIUM 40 MG capsule  Generic drug:  esomeprazole  TAKE ONE CAPSULE BY MOUTH EVERY DAY     nitroGLYCERIN 0.4 MG SL tablet  Commonly known as:  NITROSTAT  Place 1 tablet (0.4 mg total) under the tongue as directed. Place 1 tab under tongue as directed     ramipril 10 MG capsule  Commonly known as:  ALTACE  TAKE 1 CAPSULE (10 MG TOTAL) BY MOUTH 2 (TWO) TIMES DAILY.     sildenafil 100 MG tablet  Commonly known as:  VIAGRA  Take 0.5-1 tablets (50-100 mg total) by mouth daily as needed for erectile dysfunction.     warfarin 5 MG tablet  Commonly known as:  COUMADIN  Take 1-1.5 tablets (5-7.5 mg total) by mouth daily. Take  5 mg daily except on Wednesday take 7.5  Mg (per Coumadin clinic)         RatWallie RenshawN

## 2014-08-03 NOTE — Plan of Care (Signed)
Problem: Discharge Progression Outcomes Goal: Tolerating diet Outcome: Completed/Met Date Met:  08/03/14     

## 2014-08-03 NOTE — Progress Notes (Signed)
CARE MANAGEMENT NOTE 08/03/2014  Patient:  Jesse Franklin, Jesse Franklin   Account Number:  [id number]  Date Initiated:  08/03/2014  Documentation initiated by:  [facility]  Subjective/Objective Assessment:   Diarrhea/Dehydration with mild rhabdomyolysis     Action/Plan:   lives alone   Anticipated DC Date:  08/03/2014   Anticipated DC Plan:  [place]  CM consult      Choice offered to / List presented to:             Status of service:  Completed, signed off Medicare Important Message given?  YES (If response is "NO", the following Medicare IM given date fields will be blank) Date Medicare IM given:  08/03/2014 Medicare IM given by:  [facility] Date Additional Medicare IM given:   Additional Medicare IM given by:    Discharge Disposition:  HOME/SELF CARE  Per UR Regulation:    If discussed at Long Length of Stay Meetings, dates discussed:    Comments:  08/03/2014 1245 NCM spoke to pt and no NCM needs identified. Able to purchase his medications. Jonnie Finner RN CCM Case Mgmt phone [phone]

## 2014-08-03 NOTE — Progress Notes (Signed)
Family Medicine Teaching Service Daily Progress Note Intern Pager: [phone]  Patient name: Jesse Franklin Medical record number: [id number] Date of birth: [birth year] Age: 63 y.o. Gender: male  Primary Care Provider: Chrisandra Netters, MD Consultants: none Code Status: FULL  Pt Overview and Major Events to Date:  11/20: admitted, anion gap 18, Cr 1.6, CK 347 11/21: Cr improved, gap still 17 but otherwise well-appearing, still on IVF  Assessment and Plan: Jesse Franklin is a 63 y.o. male presenting with 2 weeks of diarrhea and cough. Also with chest pain. PMH is significant for HTN, HLD, CAD, Afib, and gout.   #Diarrhea/Dehydration with mild rhabdomyolysis . Etiology possibly viral in nature (URI +/- gastroenteritis).  - Diarrhea Improving, rhabdo worsenig - Cdiff negative - consider discontinuing IVF as patient is tolerating PO - f/u stool studies - CK trending up, continue IVF and dc statin   #AKI/Metabolic acidosis. - AKI resolved, likely pre-renal vs pigment induced - Metabolic acidosis with gap - stable, unclear etiology - trend BMP  #Chest pain.  - Heart score of 3. Risk factors include HTN, HLD, smoking history, and known CAD.  - Chest pain likely MSK / wall pain due to severe coughing,  - ACS ruled out -Monitor on telemetry and clinically  #Cough.  -  improved - Most likely viral in nature, doubt  PNA given clear CXR and normal WBC. Doubt influenza and outside window of benefit from Tamiflu regardless. - Will treat symptomatically with Tessalon, Mucinex  #HTN.  - Well controlled  - Holding home ramipril in setting of AKI, will restart as clinically indicated - Continue home amlodipine 32m daily  #CAD/HLD.  - S/p cath in 2005 (LAD obstructive disease).  - hold crestor as above, continue Plavix  #AFib.  - rate controlled -Continue home warfarin dosing per pharmacy  #Gout. Recently completed course of prednisone for acute flare - Continue home  colchicine  FEN/GI: heart-healthy diet, IVF as above PPx: on full anticoagulation with warfarin and Plavix and PPI  Disposition: Possible discharge today  Subjective:  Patient feeling better. Cough much improved. No chest pain or abdominal pain. Continues to have some watery diarrhea but not as severe.   Objective: Temp:  [98.4 F (36.9 C)-98.9 F (37.2 C)] 98.9 F (37.2 C) (11/23 0550) Pulse Rate:  [62-64] 64 (11/23 0550) Resp:  [16-18] 18 (11/23 0550) BP: (108-138)/(73-81) 127/81 mmHg (11/23 0550) SpO2:  [100 %] 100 % (11/23 0550) Physical Exam: Gen: NAD, alert, cooperative with exam, up watching TV HEENT: NCAT CV: RRR, no murmur, rub, or gallop noted. 2+ DP pulses b/l Resp: CTABL, no wheezes, rhonchi, or crackles; non-labored Abd: S, NT/ND, BS present, no guarding or organomegaly Ext: No edema, warm Neuro: Alert and oriented, No gross deficits   Laboratory:  Recent Labs Lab 08/01/14 0443 08/02/14 0515 08/03/14 0602  WBC 4.5 3.7* 3.8*  HGB 11.0* 10.7* 10.7*  HCT 31.8* 30.9* 30.8*  PLT 121* 142* 167    Recent Labs Lab 07/31/14 1127 08/01/14 0443 08/02/14 0515 08/03/14 0602  NA 142 145 146 146  K 3.7 3.3* 3.4* 3.0*  CL 108 112 112 110  CO2 16* 16* 19 19  BUN 13 8 8 6   CREATININE 1.62* 1.18 1.08 1.03  CALCIUM 9.7 8.6 8.5 8.3*  PROT 8.3  --   --   --   BILITOT 1.0  --   --   --   ALKPHOS 95  --   --   --   ALT 31  --   --   --  AST 30  --   --   --   GLUCOSE 93 85 88 84    Recent Labs Lab 07/31/14 1127 07/31/14 1809 07/31/14 2348 08/01/14 0443  TROPONINI <0.30 <0.30 <0.30 <0.30   Lactic acid 11/20 @1316 : 1.55 Lactic acid 11/22 @0535 : 0.9   UA 11/20 @1223 : small bilirubin, 15 ketones, >300 protein, small leukocytes, mod Hb but only 0-2 RBC/hpf  CK 11/20 @2348 : 347 CK 11/22 @0515 : 445   Micro: C.diff PCR: PENDING Stool studies (culture, O&P) PENDING  Imaging/Diagnostic Tests: Acute abd series / CXR 11/20 @1213 : no acute cardiopulmonary  process, benign abd gas pattern  ED EKG: Stable atrial fibrillation with no acute ischemic changes, couple PVC's Repeat EKG 11/21: stable, PVC  Jesse Patten, MD 08/03/2014, 8:16 AM PGY-1, [place] Intern pager: [phone], text pages welcome

## 2014-08-03 NOTE — Discharge Summary (Signed)
[facility] Discharge Summary  Patient name: Jesse Franklin Medical record number: [id number] Date of birth: Apr 19, 1951 Age: 63 y.o. Gender: male Date of Admission: 07/31/2014  Date of Discharge: 08/03/14 Admitting Physician: Andrena Mews, MD  Primary Care Provider: Chrisandra Netters, MD Consultants: None   Indication for Hospitalization: Diarrhea, cough, L-sided chest pain   Discharge Diagnoses/Problem List:  Rhabdomyolysis, mild   Disposition: Home  Discharge Condition: Stable, improved   Brief Hospital Course:  Mr. Covault is a 63 y/o who initially presented to the clinic with 2 weeks of watery diarrhea, intermittent episodes of post-tussive emesis, and abdominal wall and chest wall pain. While in clinic, he developed L sided chest pain that radiated to the left shoulder prior to being seen and was sent to the ED.  In the ED, the patient's chest pain resolved with ASA and SL nitro. EKG revealed stable afib without infract/ischemia. CXR did not reveal acute changes. He was also noted to have an anion gap metabolic acidosis and AKI (Cr 1.62). The patient was noted to have moderate Hgb in his UA with only trace RBC, therefore a CK was ordered that was found to be mildly elevated to 347.   Chest pain: EKG unchanged. Troponins negative x 3. Pain resolved prior to discharge; patient felt it was secondary to coughing.   Cough: Improved with Tessalon and Mucinex.   Diarrhea/dehydration: CK elevated and thought to be secondary to dehydration/diarrhea. IVFs given, advanced diet as tolerated.  C. Diff negative. Stool culture negative x 3days. Stool O&P pending.  A repeat CK continued to be elevated, however the patient was asymptomatic. On the day of discharge the patient endorsed occasional watery stools but was able to tolerate PO and was eager to go home. Given elevated CK, the patient's statin was not continued on discharge.  AKI/Metabolic acidosis: AKI most  likely pre-renal in nature vs pigment induced. Creatinine improved to 1.03. Anion gap fluctuating from 18 to 15 to 17 on discharge, however bicarb stable at 19.   CAD/HTN/Afib: Patient was noted to be on both Plavix and coumadin. Plavix was discontinued on the day of discharge. Home amlodipine was continued; home ramipril was initially held due to AKI, however was re-started on discharge.   Issues for Follow Up:  -- Consider f/u BMET to assess patient's AG and creatinine function.  -- Consider re-starting statin at a later date.   Significant Procedures: None   Significant Labs and Imaging:   Recent Labs Lab 08/01/14 0443 08/02/14 0515 08/03/14 0602  WBC 4.5 3.7* 3.8*  HGB 11.0* 10.7* 10.7*  HCT 31.8* 30.9* 30.8*  PLT 121* 142* 167    Recent Labs Lab 07/31/14 1127 08/01/14 0443 08/02/14 0515 08/03/14 0602  NA 142 145 146 146  K 3.7 3.3* 3.4* 3.0*  CL 108 112 112 110  CO2 16* 16* 19 19  GLUCOSE 93 85 88 84  BUN 13 8 8 6   CREATININE 1.62* 1.18 1.08 1.03  CALCIUM 9.7 8.6 8.5 8.3*  ALKPHOS 95  --   --   --   AST 30  --   --   --   ALT 31  --   --   --   ALBUMIN 4.0  --   --   --    Lactic acid 11/20 @1316 : 1.55 Lactic acid 11/22 @0535 : 0.9   UA 11/20 @1223 : small bilirubin, 15 ketones, >300 protein, small leukocytes, mod Hb but only 0-2 RBC/hpf  Cdiff negative Stool culture:  NGTD x 3 days  Results/Tests Pending at Time of Discharge: Stool O&P  Discharge Medications:    Medication List    STOP taking these medications        clopidogrel 75 MG tablet  Commonly known as:  PLAVIX     CRESTOR 40 MG tablet  Generic drug:  rosuvastatin      TAKE these medications        amLODipine 10 MG tablet  Commonly known as:  NORVASC  TAKE 1 TABLET (10 MG TOTAL) BY MOUTH DAILY.     benzonatate 100 MG capsule  Commonly known as:  TESSALON  Take 1 capsule (100 mg total) by mouth 2 (two) times daily.     colchicine 0.6 MG tablet  Take 1.15m now, then 0.667min 1 hour.  Then wait 12 hours to resume 0.64m68mID.     guaiFENesin 600 MG 12 hr tablet  Commonly known as:  MUCINEX  Take 1 tablet (600 mg total) by mouth 2 (two) times daily.     NEXIUM 40 MG capsule  Generic drug:  esomeprazole  TAKE ONE CAPSULE BY MOUTH EVERY DAY     nitroGLYCERIN 0.4 MG SL tablet  Commonly known as:  NITROSTAT  Place 1 tablet (0.4 mg total) under the tongue as directed. Place 1 tab under tongue as directed     ramipril 10 MG capsule  Commonly known as:  ALTACE  TAKE 1 CAPSULE (10 MG TOTAL) BY MOUTH 2 (TWO) TIMES DAILY.     sildenafil 100 MG tablet  Commonly known as:  VIAGRA  Take 0.5-1 tablets (50-100 mg total) by mouth daily as needed for erectile dysfunction.     warfarin 5 MG tablet  Commonly known as:  COUMADIN  Take 1-1.5 tablets (5-7.5 mg total) by mouth daily. Take 5 mg daily except on Wednesday take 7.5  Mg (per Coumadin clinic)        Discharge Instructions: Please refer to Patient Instructions section of EMR for full details.  Patient was counseled important signs and symptoms that should prompt return to medical care, changes in medications, dietary instructions, activity restrictions, and follow up appointments.   Follow-Up Appointments: Follow-up Information    Follow up with McIChrisandra NettersD On 08/05/2014.   Specialty:  Family Medicine   Why:  at 10:30am for a hospital follow up appointment   Contact information:   [phone]       CryArchie PattenD 08/03/2014, 6:21 PM PGY-1, [place]

## 2014-08-03 NOTE — Plan of Care (Signed)
Problem: Discharge Progression Outcomes Goal: Pain controlled with appropriate interventions Outcome: Completed/Met Date Met:  08/03/14     

## 2014-08-04 LAB — STOOL CULTURE

## 2014-08-05 ENCOUNTER — Encounter: Payer: Self-pay | Admitting: Family Medicine

## 2014-08-05 ENCOUNTER — Ambulatory Visit (INDEPENDENT_AMBULATORY_CARE_PROVIDER_SITE_OTHER): Payer: PRIVATE HEALTH INSURANCE | Admitting: Family Medicine

## 2014-08-05 VITALS — BP 126/77 | HR 61 | Temp 98.2°F | Ht 66.0 in | Wt 160.2 lb

## 2014-08-05 DIAGNOSIS — M1000 Idiopathic gout, unspecified site: Secondary | ICD-10-CM

## 2014-08-05 DIAGNOSIS — M10062 Idiopathic gout, left knee: Secondary | ICD-10-CM

## 2014-08-05 DIAGNOSIS — M109 Gout, unspecified: Secondary | ICD-10-CM

## 2014-08-05 MED ORDER — PREDNISONE 50 MG PO TABS: 50.0000 mg | ORAL_TABLET | Freq: Every day | ORAL | Status: DC

## 2014-08-05 NOTE — Patient Instructions (Signed)
It was great to see you again today!  Take prednisone 58m daily for 5 days for gout.  Come back to see me in 3 weeks or sooner if not better.  Be well, Dr. MArdelia Mems

## 2014-08-05 NOTE — Progress Notes (Signed)
Patient ID: Jesse Franklin, male   DOB: 25-Aug-1951, 63 y.o.   MRN: [id number]  HPI:  Patient seen today for hospital follow up. Was admitted for chest pain and ruled out for ACS. Also had cough, diarrhea, and AKI. His plavix was stopped upon discharge but unclear why this was (possibly because he is also on coumadin, but has hx of CAD). Statin held on discharge due to mild rhabdo.  No CP since discharge. Still has cough but overall getting better. Endorses occasional chills at home.  Has another gout flare in knee. Knee started hurting on Monday. Had a hamburger in hospital on Sunday and thinks it flared from eating red meat. Now very painful to bend knee, very swollen. Consistent with prior gout flares. No recent injury to the knee.  ROS: See HPI.  [place]: hx CAD, tobacco abuse, gout, afib, HLD, HTN  PHYSICAL EXAM: BP 126/77 mmHg  Pulse 61  Temp(Src) 98.2 F (36.8 C) (Oral)  Ht 5' 6"  (1.676 m)  Wt 160 lb 3.2 oz (72.666 kg)  BMI 25.87 kg/m2 Gen: NAD HEENT: NCAT Heart: RRR no murmurs Lungs: CTAB, NWOB Neuro: grossly nonfocal speech normal Ext: L knee with prominent effusion. No erythema but mildly warm. Limited ROM due to effusion and pain. R knee normal in appearance.  ASSESSMENT/PLAN:  Knee Aspiration and Injection Patient verbally consented; risks (including rare risk of infection), benefits, and alternatives explained. Patient prepped with betadine. Ethyl chloride for anesthesia. Appx 5 cc of 1% Lidocaine without epineprhine used in wheal then injected Subcutaneous fashion with on superior lateral approach. Under sterilne conditions, 18 gauge needle used via superior lateral approach to aspirate 60 cc of clear straw colored fluid. Tolerated well with immediate decrease in pain and improvement in ROM. No complications. Procedure supervised in its entirety by Dr. Mingo Amber.   Gout L knee aspirated today. Will send fluid for cell count & crystal analysis, as well as culture to ensure not  infected (doubt this based on hx, exam, and appearance of fluid). Prednisone 5 day course for gout flare. F/u in a few weeks to discuss starting allopurinol. Continue colchicine at current dose.  Hospital follow up: Doing well and improving. Will check BMET and CK when pt returns in a few weeks. Advised he should continue to take plavix (pt states he never actually stopped this) due to hx of CAD. Continue to hold statin, if CK improved at visit in a few weeks will likely resume at that time.  FOLLOW UP: F/u in 3 weeks for continued hospital f/u and gout.  Fans[place]. Ardelia Mems, [place]

## 2014-08-06 LAB — SYNOVIAL CELL COUNT + DIFF, W/ CRYSTALS
Eosinophils-Synovial: 0 % (ref 0–1)
LYMPHOCYTES-SYNOVIAL FLD: 14 % (ref 0–20)
MONOCYTE/MACROPHAGE: 9 % — AB (ref 50–90)
NEUTROPHIL, SYNOVIAL: 77 % — AB (ref 0–25)

## 2014-08-09 LAB — BODY FLUID CULTURE
GRAM STAIN: NONE SEEN
Organism ID, Bacteria: NO GROWTH

## 2014-08-10 NOTE — Assessment & Plan Note (Signed)
L knee aspirated today. Will send fluid for cell count & crystal analysis, as well as culture to ensure not infected (doubt this based on hx, exam, and appearance of fluid). Prednisone 5 day course for gout flare. F/u in a few weeks to discuss starting allopurinol. Continue colchicine at current dose.

## 2014-08-15 ENCOUNTER — Other Ambulatory Visit: Payer: Self-pay | Admitting: Family Medicine

## 2014-08-20 ENCOUNTER — Ambulatory Visit (INDEPENDENT_AMBULATORY_CARE_PROVIDER_SITE_OTHER): Payer: PRIVATE HEALTH INSURANCE

## 2014-08-20 DIAGNOSIS — I4891 Unspecified atrial fibrillation: Secondary | ICD-10-CM

## 2014-08-20 DIAGNOSIS — Z5181 Encounter for therapeutic drug level monitoring: Secondary | ICD-10-CM

## 2014-08-20 LAB — POCT INR: INR: 2.9

## 2014-08-21 ENCOUNTER — Encounter: Payer: Self-pay | Admitting: Family Medicine

## 2014-08-21 ENCOUNTER — Encounter: Payer: Self-pay | Admitting: *Deleted

## 2014-08-21 ENCOUNTER — Ambulatory Visit (INDEPENDENT_AMBULATORY_CARE_PROVIDER_SITE_OTHER): Payer: PRIVATE HEALTH INSURANCE | Admitting: Family Medicine

## 2014-08-21 VITALS — BP 120/66 | HR 46 | Temp 98.3°F | Ht 66.0 in | Wt 164.0 lb

## 2014-08-21 DIAGNOSIS — R748 Abnormal levels of other serum enzymes: Secondary | ICD-10-CM

## 2014-08-21 DIAGNOSIS — K219 Gastro-esophageal reflux disease without esophagitis: Secondary | ICD-10-CM

## 2014-08-21 DIAGNOSIS — M1A9XX1 Chronic gout, unspecified, with tophus (tophi): Secondary | ICD-10-CM

## 2014-08-21 LAB — CK: CK TOTAL: 144 U/L (ref 7–232)

## 2014-08-21 LAB — BASIC METABOLIC PANEL
BUN: 6 mg/dL (ref 6–23)
CHLORIDE: 110 meq/L (ref 96–112)
CO2: 24 mEq/L (ref 19–32)
Calcium: 8.9 mg/dL (ref 8.4–10.5)
Creat: 0.99 mg/dL (ref 0.50–1.35)
GLUCOSE: 101 mg/dL — AB (ref 70–99)
POTASSIUM: 3.4 meq/L — AB (ref 3.5–5.3)
SODIUM: 144 meq/L (ref 135–145)

## 2014-08-21 MED ORDER — FEBUXOSTAT 40 MG PO TABS: 40.0000 mg | ORAL_TABLET | Freq: Every day | ORAL | Status: DC

## 2014-08-21 NOTE — Progress Notes (Signed)
Prior Authorization received from [facility] for Uloric 40 mg tab.  PA form placed in provider box for completion. Derl Barrow, RN

## 2014-08-21 NOTE — Patient Instructions (Signed)
For gout: Starting uloric 68m daily Have your INR checked next week to make sure this isn't affecting your coumadin dosing.  Follow up with me in 1-2 months for your gout.  Be well, Dr. MArdelia Mems

## 2014-08-28 NOTE — Assessment & Plan Note (Signed)
Stable on current regimen, no changes to this today.

## 2014-08-28 NOTE — Progress Notes (Signed)
Patient ID: Jesse Franklin, male   DOB: 1950/09/12, 63 y.o.   MRN: [id number]  HPI:  GERD - taking omeprazole and ranitidine. Doing better on this regimen.   Gout - knee much better after we aspirated it at last visit. Willing to go ahead and start uric acid lowering therapy today.  Otherwise feels well without complaints  ROS: See HPI.  [place]: hx CAD, afib on coumadin, tobacco abuse, HLD, gout  PHYSICAL EXAM: BP 120/66 mmHg  Pulse 46  Temp(Src) 98.3 F (36.8 C) (Oral)  Ht 5' 6"  (1.676 m)  Wt 164 lb (74.39 kg)  BMI 26.48 kg/m2 Gen: NAD HEENT: NCAT Heart: RRR no murmurs Lungs: CTAB, NWOB Neuro: grossly nonfocal speech normal Ext: L knee without effusion palpable through pants  ASSESSMENT/PLAN:  Gout Had wanted to start allopurinol, however uloric is probably a better option for him since he's on coumadin and allopurinol has possibility of affecting warfarin metabolism. Discussed with pharmacy faculty Dr. Valentina Lucks who agrees with [name] as a better option. Asked pt to have his INR checked soon after starting medication. Will have our team call and speak with cardiologist office to get him in sooner for PT/INR recheck.  GASTROESOPHAGEAL REFLUX, NO ESOPHAGITIS Stable on current regimen, no changes to this today.  Hosp follow up - will check BMET & CK as these need to be rechecked after being in the hospital (had mild AKI and CK elevation)  FOLLOW UP: F/u in 1 month for gout  [place] J. Ardelia Mems, [place]

## 2014-08-28 NOTE — Assessment & Plan Note (Signed)
Had wanted to start allopurinol, however uloric is probably a better option for him since he's on coumadin and allopurinol has possibility of affecting warfarin metabolism. Discussed with pharmacy faculty Dr. Valentina Lucks who agrees with [facility] as a better option. Asked pt to have his INR checked soon after starting medication. Will have our team call and speak with cardiologist office to get him in sooner for PT/INR recheck.

## 2014-08-28 NOTE — Progress Notes (Signed)
PA form completed and will return to [facility]. Also, patient should have his INR checked one week after he starts the medication. Tamika, once this med is approved can you call the coumadin clinic at his cardiologist's office to get him an appointment to have his INR checked within a week? Thanks, Leeanne Rio, MD

## 2014-08-31 ENCOUNTER — Telehealth: Payer: Self-pay | Admitting: Family Medicine

## 2014-08-31 MED ORDER — POTASSIUM CHLORIDE CRYS ER 20 MEQ PO TBCR: 20.0000 meq | EXTENDED_RELEASE_TABLET | Freq: Every day | ORAL | Status: DC

## 2014-08-31 NOTE — Telephone Encounter (Signed)
Declo red team, please call pt and let him know:  His potassium was a little low so I am sending in a potassium supplement for him to take daily. We are also still working on the prior auth for Uloric (new gout medicine). One week after he starts that medicine he needs to be seen for an INR check at his cardiologist's office.  Thanks! Leeanne Rio, MD

## 2014-08-31 NOTE — Progress Notes (Signed)
PA for Uloric 40 mg faxed to [facility] for review.  Derl Barrow, RN

## 2014-09-01 NOTE — Telephone Encounter (Signed)
Patient informed of message from MD.

## 2014-09-05 ENCOUNTER — Other Ambulatory Visit: Payer: Self-pay | Admitting: Cardiovascular Disease

## 2014-09-06 ENCOUNTER — Other Ambulatory Visit: Payer: Self-pay | Admitting: Family Medicine

## 2014-09-08 NOTE — Progress Notes (Signed)
PA for Uloric approved from [facility] through 09/11/2015.  Reference number: [id number].  Left voice message informing pt that medication was approved and he needed a repeat INR one week after starting medication.  Called to schedule an appt at [facility] coumadin clinic; pt has an appt 10/01/2014 for INR; advised to wait for pt to return call to nurse at [facility] regarding approval of medication.  Once pt is aware to call coumadin clinic to have INR rechecked in one week.  Derl Barrow, RN

## 2014-10-01 ENCOUNTER — Ambulatory Visit (INDEPENDENT_AMBULATORY_CARE_PROVIDER_SITE_OTHER): Payer: Medicare Other | Admitting: *Deleted

## 2014-10-01 DIAGNOSIS — Z5181 Encounter for therapeutic drug level monitoring: Secondary | ICD-10-CM

## 2014-10-01 DIAGNOSIS — I4891 Unspecified atrial fibrillation: Secondary | ICD-10-CM

## 2014-10-01 LAB — POCT INR: INR: 2.3

## 2014-10-06 ENCOUNTER — Encounter: Payer: Self-pay | Admitting: Family Medicine

## 2014-10-06 ENCOUNTER — Ambulatory Visit (INDEPENDENT_AMBULATORY_CARE_PROVIDER_SITE_OTHER): Payer: Medicare Other | Admitting: Family Medicine

## 2014-10-06 VITALS — BP 130/77 | HR 85 | Temp 98.6°F | Ht 66.0 in | Wt 164.0 lb

## 2014-10-06 DIAGNOSIS — M7122 Synovial cyst of popliteal space [Baker], left knee: Secondary | ICD-10-CM | POA: Diagnosis not present

## 2014-10-06 DIAGNOSIS — M1A9XX1 Chronic gout, unspecified, with tophus (tophi): Secondary | ICD-10-CM

## 2014-10-06 DIAGNOSIS — M7120 Synovial cyst of popliteal space [Baker], unspecified knee: Secondary | ICD-10-CM | POA: Insufficient documentation

## 2014-10-06 LAB — BASIC METABOLIC PANEL
BUN: 9 mg/dL (ref 6–23)
CO2: 24 meq/L (ref 19–32)
CREATININE: 1.06 mg/dL (ref 0.50–1.35)
Calcium: 9.8 mg/dL (ref 8.4–10.5)
Chloride: 109 mEq/L (ref 96–112)
Glucose, Bld: 102 mg/dL — ABNORMAL HIGH (ref 70–99)
Potassium: 3.6 mEq/L (ref 3.5–5.3)
SODIUM: 142 meq/L (ref 135–145)

## 2014-10-06 LAB — URIC ACID: Uric Acid, Serum: 4.3 mg/dL (ref 4.0–7.8)

## 2014-10-06 NOTE — Assessment & Plan Note (Signed)
Tolerating Uloric. Does not seem to have affected his INR. We will recheck a BMET and uric acid level today. Given his history of tophaceous gout, would like to lower his uric acid level is much as possible.

## 2014-10-06 NOTE — Patient Instructions (Signed)
Rechecking labwork Follow up in 3 months or sooner if you have any problems  Be well, Dr. Ardelia Mems

## 2014-10-06 NOTE — Assessment & Plan Note (Signed)
No clinical signs or symptoms of DVT. Posterior knee nodular area appears to be a Baker's cyst. Precepted with Dr. McDiarmid who also examined patient and agrees with this diagnosis. No interventions at this time as it is nontender, continue to monitor.

## 2014-10-06 NOTE — Progress Notes (Signed)
Patient ID: Jesse Franklin, male   DOB: 08/09/1951, 64 y.o.   MRN: [id number]  HPI:  Gout: Taking uloric. No fevers. He does notice a firm nodule on the back of his left knee that just started today. It is nontender to palpation. He has not had any unwanted side effects from the uloric. Recently had INR checked which was therapeutic.  Hypokalemia: He is tolerating the potassium 20 mEq daily. Agreeable to recheck his BMET today.  ROS: See HPI.  [place]: CAD, tobacco abuse, hyperlipidemia, hypertension, atrial fibrillation, gout  PHYSICAL EXAM: BP 130/77 mmHg  Pulse 85  Temp(Src) 98.6 F (37 C) (Oral)  Ht 5' 6"  (8.832 m)  Wt 164 lb (74.39 kg)  BMI 26.48 kg/m2 Gen: No acute distress, pleasant, cooperative HEENT: Normocephalic, atraumatic Lungs: Normal respiratory effort Neuro: Grossly nonfocal, speech normal Ext: Left knee without obvious effusion. Full range of motion both actively and passively. No erythema or warmth over the left knee or left calf. Negative Homans on the left. There is a firm approximately 2 cm round nodule in the popliteal area. It softens when knee is passively flexed. Nontender to palpation  ASSESSMENT/PLAN:  Baker's cyst of knee No clinical signs or symptoms of DVT. Posterior knee nodular area appears to be a Baker's cyst. Precepted with Dr. McDiarmid who also examined patient and agrees with this diagnosis. No interventions at this time as it is nontender, continue to monitor.   Gout Tolerating Uloric. Does not seem to have affected his INR. We will recheck a BMET and uric acid level today. Given his history of tophaceous gout, would like to lower his uric acid level is much as possible.    FOLLOW UP: F/u in 3 months for routine medical issues  [place] J. Ardelia Mems, [place]

## 2014-10-08 ENCOUNTER — Encounter: Payer: Self-pay | Admitting: Family Medicine

## 2014-10-12 ENCOUNTER — Ambulatory Visit (INDEPENDENT_AMBULATORY_CARE_PROVIDER_SITE_OTHER): Payer: Medicare Other | Admitting: Family Medicine

## 2014-10-12 ENCOUNTER — Encounter: Payer: Self-pay | Admitting: Family Medicine

## 2014-10-12 ENCOUNTER — Ambulatory Visit: Payer: Medicaid Other | Admitting: Family Medicine

## 2014-10-12 VITALS — BP 119/84 | HR 64 | Temp 98.0°F | Ht 66.0 in | Wt 156.0 lb

## 2014-10-12 DIAGNOSIS — M10062 Idiopathic gout, left knee: Secondary | ICD-10-CM | POA: Diagnosis not present

## 2014-10-12 DIAGNOSIS — M109 Gout, unspecified: Secondary | ICD-10-CM | POA: Insufficient documentation

## 2014-10-12 MED ORDER — METHYLPREDNISOLONE ACETATE 80 MG/ML IJ SUSP
80.0000 mg | Freq: Once | INTRAMUSCULAR | Status: AC
  Administered 2014-10-12: 80 mg via INTRA_ARTICULAR

## 2014-10-12 MED ORDER — TRAMADOL HCL 50 MG PO TABS: 50.0000 mg | ORAL_TABLET | Freq: Three times a day (TID) | ORAL | Status: DC | PRN

## 2014-10-12 NOTE — Assessment & Plan Note (Addendum)
Current gout flare, in November had gout flare with similar presentation and was proven by aspiration and Crystal recognition that time Recently started to work with good decrease in his uric acid level Aspirated knee today with steroid injection the same time Follow-up as planned with PCP Plan to discontinue ibuprofen with concomitant warfarin use Tramadol for pain

## 2014-10-12 NOTE — Progress Notes (Signed)
Patient ID: Jesse Franklin, male   DOB: [birth year], 64 y.o.   MRN: [id number]   HPI  Patient presents today for gout flare  Explains that approximately 6 days ago he developed worsening left knee pain. It's worse with walking or bending. He states that he is diagnosed with gout with a very similar presentation in November. He since started to work and has continued to take it. He requests knee aspiration today.  He describes the pain as a severe sharp left knee pain worse with palpation and walking. He's tried tramadol and ibuprofen with minimal improvement. He notes some swelling but no redness of the left knee.  He denies other problems today  Smoking status noted ROS: Per HPI  Objective: BP 119/84 mmHg  Pulse 64  Temp(Src) 98 F (36.7 C) (Oral)  Ht 5' 6"  (1.676 m)  Wt 156 lb (70.761 kg)  BMI 25.19 kg/m2 Gen: NAD, alert, cooperative with exam HEENT: NCAT CV: RRR, good S1/S2, no murmur Resp: CTABL, no wheezes, non-labored Ext: No edema, warm Neuro: Alert and oriented, No gross deficits  Musculoskeletal: Left knee slightly warm to the touch, tenderness to palpation of the anterior medial portion of the knee as well as the popliteal fossa, no erythema.  Assessment and plan:  Knee Aspiration and Injection Patient verbally consented and signed consent placed on chart; risks (including rare risk of infection), benefits, and alternatives explained. Patient prepped with betadine. Ethyl chloride for anesthesia. Appx 4 cc of 1% Lidocaine without epineprhine used in wheal then injected Subcutaneous fashion with on superior lateral approach. Under sterilne conditions, 18 gauge needle used via superior lateral approach to aspirate 100 cc of clear to cloudy straw colored fluid. Tolerated well with immediate decrease in pain and improvement in ROM. No complications. Procedure supervised in its entirety by Dr. Walker Kehr.   Gout flare Current gout flare, in November had gout flare with similar  presentation and was proven by aspiration and Crystal recognition that time Recently started to work with good decrease in his uric acid level Aspirated knee today with steroid injection the same time Follow-up as planned with PCP Plan to discontinue ibuprofen with concomitant warfarin use Tramadol for pain     Meds ordered this encounter  Medications  . methylPREDNISolone acetate (DEPO-MEDROL) injection 80 mg    Sig:   . traMADol (ULTRAM) 50 MG tablet    Sig: Take 1 tablet (50 mg total) by mouth every 8 (eight) hours as needed.    Dispense:  30 tablet    Refill:  0

## 2014-10-12 NOTE — Patient Instructions (Signed)
Great to meet you!  Your knee should start to feel better over the next 2 days Continue the uloric

## 2014-10-13 ENCOUNTER — Other Ambulatory Visit: Payer: Self-pay | Admitting: Family Medicine

## 2014-10-21 ENCOUNTER — Ambulatory Visit: Payer: Medicare Other | Admitting: Family Medicine

## 2014-10-22 ENCOUNTER — Other Ambulatory Visit: Payer: Self-pay

## 2014-10-22 ENCOUNTER — Encounter (HOSPITAL_COMMUNITY): Payer: Self-pay

## 2014-10-22 ENCOUNTER — Inpatient Hospital Stay (HOSPITAL_COMMUNITY)
Admission: EM | Admit: 2014-10-22 | Discharge: 2014-10-25 | DRG: 683 | Disposition: A | Discharge status: Home / Self Care | Payer: Medicare Other | Attending: Family Medicine | Admitting: Family Medicine

## 2014-10-22 DIAGNOSIS — F1010 Alcohol abuse, uncomplicated: Secondary | ICD-10-CM | POA: Diagnosis not present

## 2014-10-22 DIAGNOSIS — K219 Gastro-esophageal reflux disease without esophagitis: Secondary | ICD-10-CM | POA: Diagnosis not present

## 2014-10-22 DIAGNOSIS — E860 Dehydration: Secondary | ICD-10-CM | POA: Insufficient documentation

## 2014-10-22 DIAGNOSIS — K529 Noninfective gastroenteritis and colitis, unspecified: Secondary | ICD-10-CM | POA: Diagnosis not present

## 2014-10-22 DIAGNOSIS — E785 Hyperlipidemia, unspecified: Secondary | ICD-10-CM | POA: Diagnosis not present

## 2014-10-22 DIAGNOSIS — M109 Gout, unspecified: Secondary | ICD-10-CM | POA: Diagnosis not present

## 2014-10-22 DIAGNOSIS — I10 Essential (primary) hypertension: Secondary | ICD-10-CM | POA: Diagnosis present

## 2014-10-22 DIAGNOSIS — N400 Enlarged prostate without lower urinary tract symptoms: Secondary | ICD-10-CM | POA: Diagnosis present

## 2014-10-22 DIAGNOSIS — Z951 Presence of aortocoronary bypass graft: Secondary | ICD-10-CM | POA: Diagnosis not present

## 2014-10-22 DIAGNOSIS — I272 Other secondary pulmonary hypertension: Secondary | ICD-10-CM | POA: Diagnosis not present

## 2014-10-22 DIAGNOSIS — I252 Old myocardial infarction: Secondary | ICD-10-CM

## 2014-10-22 DIAGNOSIS — E872 Acidosis: Secondary | ICD-10-CM | POA: Diagnosis not present

## 2014-10-22 DIAGNOSIS — I482 Chronic atrial fibrillation: Secondary | ICD-10-CM | POA: Diagnosis present

## 2014-10-22 DIAGNOSIS — R112 Nausea with vomiting, unspecified: Secondary | ICD-10-CM | POA: Diagnosis not present

## 2014-10-22 DIAGNOSIS — N281 Cyst of kidney, acquired: Secondary | ICD-10-CM | POA: Diagnosis not present

## 2014-10-22 DIAGNOSIS — Z885 Allergy status to narcotic agent status: Secondary | ICD-10-CM

## 2014-10-22 DIAGNOSIS — N179 Acute kidney failure, unspecified: Principal | ICD-10-CM | POA: Diagnosis present

## 2014-10-22 DIAGNOSIS — F209 Schizophrenia, unspecified: Secondary | ICD-10-CM | POA: Diagnosis present

## 2014-10-22 DIAGNOSIS — F1410 Cocaine abuse, uncomplicated: Secondary | ICD-10-CM | POA: Diagnosis not present

## 2014-10-22 DIAGNOSIS — F17210 Nicotine dependence, cigarettes, uncomplicated: Secondary | ICD-10-CM | POA: Diagnosis present

## 2014-10-22 DIAGNOSIS — R1030 Lower abdominal pain, unspecified: Secondary | ICD-10-CM | POA: Diagnosis not present

## 2014-10-22 DIAGNOSIS — E780 Pure hypercholesterolemia: Secondary | ICD-10-CM | POA: Diagnosis present

## 2014-10-22 DIAGNOSIS — Z7901 Long term (current) use of anticoagulants: Secondary | ICD-10-CM | POA: Diagnosis not present

## 2014-10-22 DIAGNOSIS — F339 Major depressive disorder, recurrent, unspecified: Secondary | ICD-10-CM | POA: Diagnosis not present

## 2014-10-22 DIAGNOSIS — I2510 Atherosclerotic heart disease of native coronary artery without angina pectoris: Secondary | ICD-10-CM | POA: Diagnosis present

## 2014-10-22 DIAGNOSIS — R791 Abnormal coagulation profile: Secondary | ICD-10-CM | POA: Insufficient documentation

## 2014-10-22 LAB — COMPREHENSIVE METABOLIC PANEL
ALBUMIN: 3.8 g/dL (ref 3.5–5.2)
ALT: 44 U/L (ref 0–53)
AST: 74 U/L — AB (ref 0–37)
Alkaline Phosphatase: 100 U/L (ref 39–117)
Anion gap: 14 (ref 5–15)
BILIRUBIN TOTAL: 1 mg/dL (ref 0.3–1.2)
BUN: 94 mg/dL — ABNORMAL HIGH (ref 6–23)
CHLORIDE: 118 mmol/L — AB (ref 96–112)
CO2: 11 mmol/L — ABNORMAL LOW (ref 19–32)
Calcium: 7.9 mg/dL — ABNORMAL LOW (ref 8.4–10.5)
Creatinine, Ser: 5.75 mg/dL — ABNORMAL HIGH (ref 0.50–1.35)
GFR calc Af Amer: 11 mL/min — ABNORMAL LOW (ref >90)
GFR calc non Af Amer: 10 mL/min — ABNORMAL LOW (ref >90)
Glucose, Bld: 113 mg/dL — ABNORMAL HIGH (ref 70–99)
POTASSIUM: 4.5 mmol/L (ref 3.5–5.1)
Sodium: 143 mmol/L (ref 135–145)
Total Protein: 8.4 g/dL — ABNORMAL HIGH (ref 6.0–8.3)

## 2014-10-22 LAB — CBC WITH DIFFERENTIAL/PLATELET
Basophils Absolute: 0 10*3/uL (ref 0.0–0.1)
Basophils Relative: 0 % (ref 0–1)
Eosinophils Absolute: 0.1 10*3/uL (ref 0.0–0.7)
Eosinophils Relative: 2 % (ref 0–5)
HCT: 38.8 % — ABNORMAL LOW (ref 39.0–52.0)
Hemoglobin: 13.6 g/dL (ref 13.0–17.0)
LYMPHS ABS: 1.4 10*3/uL (ref 0.7–4.0)
Lymphocytes Relative: 27 % (ref 12–46)
MCH: 31 pg (ref 26.0–34.0)
MCHC: 35.1 g/dL (ref 30.0–36.0)
MCV: 88.4 fL (ref 78.0–100.0)
Monocytes Absolute: 0.5 10*3/uL (ref 0.1–1.0)
Monocytes Relative: 10 % (ref 3–12)
NEUTROS ABS: 3.1 10*3/uL (ref 1.7–7.7)
NEUTROS PCT: 61 % (ref 43–77)
PLATELETS: 207 10*3/uL (ref 150–400)
RBC: 4.39 MIL/uL (ref 4.22–5.81)
RDW: 14.5 % (ref 11.5–15.5)
WBC: 5 10*3/uL (ref 4.0–10.5)

## 2014-10-22 LAB — I-STAT TROPONIN, ED: TROPONIN I, POC: 0.05 ng/mL (ref 0.00–0.08)

## 2014-10-22 LAB — LIPASE, BLOOD: Lipase: 92 U/L — ABNORMAL HIGH (ref 11–59)

## 2014-10-22 MED ORDER — ONDANSETRON HCL 4 MG/2ML IJ SOLN
4.0000 mg | Freq: Once | INTRAMUSCULAR | Status: AC
Start: 2014-10-22 — End: 2014-10-22
  Administered 2014-10-22: 4 mg via INTRAVENOUS

## 2014-10-22 MED ORDER — SODIUM CHLORIDE 0.9 % IV BOLUS (SEPSIS): 1000.0000 mL | Freq: Once | INTRAVENOUS | Status: DC

## 2014-10-22 MED ORDER — ONDANSETRON HCL 4 MG/2ML IJ SOLN
4.0000 mg | Freq: Once | INTRAMUSCULAR | Status: DC
Start: 2014-10-22 — End: 2014-10-22

## 2014-10-22 MED ORDER — HYDROMORPHONE HCL 1 MG/ML IJ SOLN
1.0000 mg | Freq: Once | INTRAMUSCULAR | Status: AC
  Administered 2014-10-22: 1 mg via INTRAVENOUS
  Filled 2014-10-22: qty 1

## 2014-10-22 MED ORDER — SODIUM CHLORIDE 0.9 % IV SOLN
1000.0000 mL | Freq: Once | INTRAVENOUS | Status: AC
  Administered 2014-10-22: 1000 mL via INTRAVENOUS

## 2014-10-22 MED ORDER — ONDANSETRON HCL 4 MG/2ML IJ SOLN
4.0000 mg | Freq: Once | INTRAMUSCULAR | Status: DC
  Filled 2014-10-22: qty 2

## 2014-10-22 MED ORDER — IOHEXOL 300 MG/ML  SOLN
25.0000 mL | Freq: Once | INTRAMUSCULAR | Status: AC | PRN
  Administered 2014-10-22: 25 mL via ORAL

## 2014-10-22 NOTE — ED Provider Notes (Signed)
CSN: [id number]     Arrival date & time 10/22/14  2212 History   First MD Initiated Contact with Patient 10/22/14 2247     Chief Complaint  Patient presents with  . Emesis  . Nausea  . Abdominal Pain     (Consider location/radiation/quality/duration/timing/severity/associated sxs/prior Treatment) HPI Comments: 64 yo M hx of CAD, gout, HLD, HTN, Atrial fibrillation on Coumadin, presents with CC abdominal pain, emesis.  Pt states symptoms started 1 week ago.  He c/o lower abdominal pain, nausea, nonbilious emesis, nonbloody diarrhea, hiccups, subjective fever/chills.  Yesterday pt also had mild blood streaked vomitus after several episodes of vomiting, but denies any hematemesis today, or bloody/dark school.  Pt states urine intermittently concentrated, but denies decreased UOP, or dysuria.  Denies CP, SOB, cough, constipation, rash, myalgias, or any other symptoms.  Pt states he has still been able to take all medications at home.  New recent Rx for Tramadol, after gout flare last week.  Denies hx of abdominal surgeries.  No other concerns.    The history is provided by the patient. No language interpreter was used.    Past Medical History  Diagnosis Date  . Anemia   . Hypertension   . Gout   . Atrial fibrillation   . GERD (gastroesophageal reflux disease)   . Hyperlipidemia   . Herpes   . RHINITIS, ALLERGIC 11/08/2006  . Herpes genitalia 04/13/2011  . CAD (coronary artery disease)     Branch Vessel  . Tobacco abuse   . Alcohol abuse   . Cocaine abuse     Hx of  . BPH 11/08/2006  . Myocardial infarction 2005   Past Surgical History  Procedure Laterality Date  . Laceration repair Right 03/1988    "hand"  . Cardiac catheterization  08/2004   Family History  Problem Relation Age of Onset  . Alcohol abuse Father   . Heart disease Brother   . Alcohol abuse Brother   . Cancer Sister   . Hypertension Mother   . Gout Mother    History  Substance Use Topics  . Smoking status:  Current Every Day Smoker -- 0.10 packs/day for 48 years    Types: Cigarettes  . Smokeless tobacco: Former Systems developer    Types: Chew    Quit date: 09/12/1967     Comment: Chewed Red Man Tobacco  . Alcohol Use: No     Comment: Pt states that he quit since last gout flare up    Review of Systems  Constitutional: Positive for fever and chills.  Respiratory: Negative for cough and shortness of breath.   Cardiovascular: Negative for chest pain.  Gastrointestinal: Positive for nausea, vomiting, abdominal pain and diarrhea. Negative for constipation.  Genitourinary: Negative for dysuria and decreased urine volume.  Musculoskeletal: Negative for myalgias.  Skin: Negative for rash.  Neurological: Negative for dizziness, weakness, light-headedness and headaches.  Hematological: Negative for adenopathy. Does not bruise/bleed easily.  All other systems reviewed and are negative.     Allergies  Codeine and Ketorolac tromethamine  Home Medications   Prior to Admission medications   Medication Sig Start Date End Date Taking? Authorizing Provider  amLODipine (NORVASC) 10 MG tablet TAKE 1 TABLET (10 MG TOTAL) BY MOUTH DAILY. 08/18/14   Leeanne Rio, MD  benzonatate (TESSALON) 100 MG capsule Take 1 capsule (100 mg total) by mouth 2 (two) times daily. 08/03/14   Archie Patten, MD  CRESTOR 40 MG tablet TAKE 1 TABLET BY MOUTH EVERY DAY  09/08/14   Leeanne Rio, MD  febuxostat (ULORIC) 40 MG tablet Take 1 tablet (40 mg total) by mouth daily. 08/21/14   Leeanne Rio, MD  guaiFENesin (MUCINEX) 600 MG 12 hr tablet Take 1 tablet (600 mg total) by mouth 2 (two) times daily. 08/03/14   Archie Patten, MD  NEXIUM 40 MG capsule TAKE ONE CAPSULE BY MOUTH EVERY DAY 10/13/14   Hilton Sinclair, MD  nitroGLYCERIN (NITROSTAT) 0.4 MG SL tablet Place 1 tablet (0.4 mg total) under the tongue as directed. Place 1 tab under tongue as directed 02/20/12   Thayer Headings, MD  potassium chloride SA  (K-DUR,KLOR-CON) 20 MEQ tablet Take 1 tablet (20 mEq total) by mouth daily. 08/31/14   Leeanne Rio, MD  predniSONE (DELTASONE) 50 MG tablet Take 1 tablet (50 mg total) by mouth daily with breakfast. 08/05/14   Leeanne Rio, MD  ramipril (ALTACE) 10 MG capsule TAKE ONE CAPSULE BY MOUTH TWICE A DAY 09/08/14   Leeanne Rio, MD  sildenafil (VIAGRA) 100 MG tablet Take 0.5-1 tablets (50-100 mg total) by mouth daily as needed for erectile dysfunction. Patient not taking: Reported on 07/31/2014 11/08/11 12/08/11  Donnal Moat, MD  traMADol (ULTRAM) 50 MG tablet Take 1 tablet (50 mg total) by mouth every 8 (eight) hours as needed. 10/12/14   Timmothy Euler, MD  warfarin (COUMADIN) 5 MG tablet TAKE AS DIRECTED BY ANTICOAGULATION CLINIC 09/07/14   Thayer Headings, MD   BP 99/63 mmHg  Pulse 56  Resp 22  Ht 5' 6"  (1.676 m)  Wt 154 lb (69.854 kg)  BMI 24.87 kg/m2  SpO2 100% Physical Exam  Constitutional: He is oriented to person, place, and time. He appears well-developed and well-nourished.  HENT:  Head: Normocephalic and atraumatic.  Right Ear: External ear normal.  Left Ear: External ear normal.  Mouth/Throat: Oropharynx is clear and moist.  Eyes: EOM are normal. Pupils are equal, round, and reactive to light.  Neck: Normal range of motion. Neck supple.  Cardiovascular: Normal rate, regular rhythm, normal heart sounds and intact distal pulses.   Pulmonary/Chest: Effort normal and breath sounds normal. No respiratory distress. He has no wheezes. He has no rales. He exhibits no tenderness.  Abdominal: Soft. Bowel sounds are normal. He exhibits no distension and no mass. There is tenderness. There is no rebound and no guarding.  Diffuse abdominal TTP, worse lower abdomen.  Otherwise soft, nondistended, no guarding, no rebound.  No CVA TTP.   Musculoskeletal: Normal range of motion.  Neurological: He is alert and oriented to person, place, and time.  Skin: Skin is warm and dry.   Nursing note and vitals reviewed.   ED Course  Procedures (including critical care time) Labs Review Labs Reviewed  CBC WITH DIFFERENTIAL/PLATELET - Abnormal; Notable for the following:    HCT 38.8 (*)    All other components within normal limits  COMPREHENSIVE METABOLIC PANEL - Abnormal; Notable for the following:    Chloride 118 (*)    CO2 11 (*)    Glucose, Bld 113 (*)    BUN 94 (*)    Creatinine, Ser 5.75 (*)    Calcium 7.9 (*)    Total Protein 8.4 (*)    AST 74 (*)    GFR calc non Af Amer 10 (*)    GFR calc Af Amer 11 (*)    All other components within normal limits  LIPASE, BLOOD - Abnormal; Notable for the following:  Lipase 92 (*)    All other components within normal limits  PROTIME-INR - Abnormal; Notable for the following:    Prothrombin Time 74.5 (*)    INR 9.07 (*)    All other components within normal limits  I-STAT TROPOININ, ED  I-STAT CG4 LACTIC ACID, ED    Imaging Review Ct Abdomen Pelvis Wo Contrast  10/23/2014   CLINICAL DATA:  Low abdominal pain with nausea, vomiting, diarrhea, and he clips for 2 weeks. Oral contrast only due to elevated creatinine.  EXAM: CT ABDOMEN AND PELVIS WITHOUT CONTRAST  TECHNIQUE: Multidetector CT imaging of the abdomen and pelvis was performed following the standard protocol without IV contrast.  COMPARISON:  09/02/2004  FINDINGS: Lung bases are clear.  Small esophageal hiatal hernia.  Motion artifact limits evaluation of the upper abdomen. Multiple circumscribed low-attenuation lesions are demonstrated throughout the liver, with the largest measuring about 18 mm diameter. These of been present on the prior study and probably represent multiple cysts. Characterization is limited due to size and lack of IV contrast material. The unenhanced appearance of the gallbladder, spleen, pancreas, adrenal glands, abdominal aorta, inferior vena cava, and retroperitoneal lymph nodes is unremarkable. Vague low-attenuation lesion in the upper  pole of the right kidney corresponds to a cyst seen on prior study. No hydronephrosis in either kidney. Stomach is decompressed. Small bowel are not abnormally distended. No wall thickening demonstrated in the contrast filled loops. Distal loops are decompressed. Stool throughout the colon. No colonic distention and no specific wall thickening identified. No free air or free fluid in the abdomen. Small umbilical hernia containing fat.  Pelvis: No free or loculated pelvic fluid collections. The appendix is normal. No evidence of diverticulitis. Mild enlargement of the prostate gland. Bladder is decompressed. Fat in the inguinal canals bilaterally. Degenerative changes in the lumbar spine. No destructive bone lesions appreciated.  IMPRESSION: No acute process demonstrated in the abdomen or pelvis. Small esophageal hiatal hernia. Multiple hepatic lesions presumably representing cysts. Probable right renal cyst. No evidence of bowel obstruction.   Electronically Signed   By: Lucienne Capers M.D.   On: 10/23/2014 02:37     EKG Interpretation None     Atrial fibrillation, PVC, rate 83, PR *, QT/QTc 387/455 MDM   Final diagnoses:  Abdominal pain, lower   64 yo M hx of CAD, gout, HLD, HTN, Atrial fibrillation on Coumadin, presents with CC abdominal pain, emesis.   Physical exam as above.  Pt afebrile, satting well on room air, good pulse, BP low 100's.  Pt c/o nausea, vomiting, abdominal pain, diarrhea.  DDx gastroenteritis, infectious diarrhea, appendicitis, cholecystitis, SBO, other intraabdominal infection.    Pt given IV fluids, Zofran for nausea, Dilaudid for pain, with some improvement in symptoms.   Labs demonstrate WBC 5.0, CMP with elevated BUN 94, Cr 5.75, slightly elevated AST, with normal ALT, alk phos, and total bili, Lipase 92.    Will place CT abdomen and pelvis w PO contrast for further eval.  Ultimate disposition will be admit, given acute renal failure.  Will reeval.    On reeval pt  still with nausea, and some chills.  Pressures soft in 90's, no signs of fluid overload after first liter.  Will given another NS bolus, and then start maintenance fluids.  Will recheck temp given continued chills.  Pt ready for CT at this time.    CT abdomen/pelvis unremarkable.  Family Medicine consulted for admission. 2:41 AM   Spoke with family medicine.  Pt  to be admitted for AKI, ? Likely secondary to volume losses vomiting, diarrhea.  Will admit to attending Dr. Annabell Sabal.  Pt understands and agrees with plan. 3:04 AM  Sinda Du  I discussed this patient with my attending Dr. Colin Rhein.      Sinda Du, MD 10/23/14 1594  Debby Freiberg, MD 10/26/14 [phone]

## 2014-10-22 NOTE — ED Notes (Signed)
MD in room

## 2014-10-22 NOTE — ED Notes (Signed)
Per EMS, spitting up blood, complains of n/v/d x 3 weeks. Unable to keep food down since yesterday. VSS. Pt in no acute distress upon arrival

## 2014-10-23 ENCOUNTER — Emergency Department (HOSPITAL_COMMUNITY): Payer: Medicare Other

## 2014-10-23 DIAGNOSIS — F1010 Alcohol abuse, uncomplicated: Secondary | ICD-10-CM | POA: Diagnosis present

## 2014-10-23 DIAGNOSIS — I10 Essential (primary) hypertension: Secondary | ICD-10-CM | POA: Diagnosis present

## 2014-10-23 DIAGNOSIS — R791 Abnormal coagulation profile: Secondary | ICD-10-CM | POA: Diagnosis not present

## 2014-10-23 DIAGNOSIS — K529 Noninfective gastroenteritis and colitis, unspecified: Secondary | ICD-10-CM | POA: Diagnosis present

## 2014-10-23 DIAGNOSIS — Z7901 Long term (current) use of anticoagulants: Secondary | ICD-10-CM | POA: Diagnosis not present

## 2014-10-23 DIAGNOSIS — F1410 Cocaine abuse, uncomplicated: Secondary | ICD-10-CM | POA: Diagnosis present

## 2014-10-23 DIAGNOSIS — R112 Nausea with vomiting, unspecified: Secondary | ICD-10-CM

## 2014-10-23 DIAGNOSIS — F209 Schizophrenia, unspecified: Secondary | ICD-10-CM | POA: Diagnosis present

## 2014-10-23 DIAGNOSIS — N281 Cyst of kidney, acquired: Secondary | ICD-10-CM | POA: Diagnosis present

## 2014-10-23 DIAGNOSIS — E872 Acidosis: Secondary | ICD-10-CM | POA: Diagnosis present

## 2014-10-23 DIAGNOSIS — F339 Major depressive disorder, recurrent, unspecified: Secondary | ICD-10-CM | POA: Diagnosis present

## 2014-10-23 DIAGNOSIS — E785 Hyperlipidemia, unspecified: Secondary | ICD-10-CM | POA: Diagnosis present

## 2014-10-23 DIAGNOSIS — I252 Old myocardial infarction: Secondary | ICD-10-CM | POA: Diagnosis not present

## 2014-10-23 DIAGNOSIS — Z951 Presence of aortocoronary bypass graft: Secondary | ICD-10-CM | POA: Diagnosis not present

## 2014-10-23 DIAGNOSIS — R1030 Lower abdominal pain, unspecified: Secondary | ICD-10-CM | POA: Insufficient documentation

## 2014-10-23 DIAGNOSIS — I2510 Atherosclerotic heart disease of native coronary artery without angina pectoris: Secondary | ICD-10-CM | POA: Diagnosis present

## 2014-10-23 DIAGNOSIS — E780 Pure hypercholesterolemia: Secondary | ICD-10-CM | POA: Diagnosis present

## 2014-10-23 DIAGNOSIS — K219 Gastro-esophageal reflux disease without esophagitis: Secondary | ICD-10-CM | POA: Diagnosis present

## 2014-10-23 DIAGNOSIS — M109 Gout, unspecified: Secondary | ICD-10-CM | POA: Diagnosis present

## 2014-10-23 DIAGNOSIS — Z885 Allergy status to narcotic agent status: Secondary | ICD-10-CM | POA: Diagnosis not present

## 2014-10-23 DIAGNOSIS — I482 Chronic atrial fibrillation: Secondary | ICD-10-CM | POA: Diagnosis present

## 2014-10-23 DIAGNOSIS — F17210 Nicotine dependence, cigarettes, uncomplicated: Secondary | ICD-10-CM | POA: Diagnosis present

## 2014-10-23 DIAGNOSIS — I272 Other secondary pulmonary hypertension: Secondary | ICD-10-CM | POA: Diagnosis present

## 2014-10-23 DIAGNOSIS — N179 Acute kidney failure, unspecified: Secondary | ICD-10-CM | POA: Diagnosis not present

## 2014-10-23 DIAGNOSIS — E860 Dehydration: Secondary | ICD-10-CM | POA: Diagnosis not present

## 2014-10-23 DIAGNOSIS — N400 Enlarged prostate without lower urinary tract symptoms: Secondary | ICD-10-CM | POA: Diagnosis present

## 2014-10-23 LAB — COMPREHENSIVE METABOLIC PANEL
ALT: 40 U/L (ref 0–53)
ANION GAP: 10 (ref 5–15)
AST: 68 U/L — AB (ref 0–37)
Albumin: 3.2 g/dL — ABNORMAL LOW (ref 3.5–5.2)
Alkaline Phosphatase: 83 U/L (ref 39–117)
BILIRUBIN TOTAL: 0.8 mg/dL (ref 0.3–1.2)
BUN: 86 mg/dL — AB (ref 6–23)
CHLORIDE: 123 mmol/L — AB (ref 96–112)
CO2: 12 mmol/L — ABNORMAL LOW (ref 19–32)
CREATININE: 4.63 mg/dL — AB (ref 0.50–1.35)
Calcium: 7.1 mg/dL — ABNORMAL LOW (ref 8.4–10.5)
GFR calc Af Amer: 14 mL/min — ABNORMAL LOW (ref >90)
GFR calc non Af Amer: 12 mL/min — ABNORMAL LOW (ref >90)
Glucose, Bld: 86 mg/dL (ref 70–99)
Potassium: 4.3 mmol/L (ref 3.5–5.1)
Sodium: 145 mmol/L (ref 135–145)
Total Protein: 6.9 g/dL (ref 6.0–8.3)

## 2014-10-23 LAB — BASIC METABOLIC PANEL
Anion gap: 13 (ref 5–15)
BUN: 75 mg/dL — ABNORMAL HIGH (ref 6–23)
CO2: 11 mmol/L — ABNORMAL LOW (ref 19–32)
Calcium: 6.8 mg/dL — ABNORMAL LOW (ref 8.4–10.5)
Chloride: 122 mmol/L — ABNORMAL HIGH (ref 96–112)
Creatinine, Ser: 3.86 mg/dL — ABNORMAL HIGH (ref 0.50–1.35)
GFR calc non Af Amer: 15 mL/min — ABNORMAL LOW (ref >90)
GFR, EST AFRICAN AMERICAN: 18 mL/min — AB (ref >90)
Glucose, Bld: 78 mg/dL (ref 70–99)
POTASSIUM: 4.4 mmol/L (ref 3.5–5.1)
Sodium: 145 mmol/L (ref 135–145)

## 2014-10-23 LAB — URINALYSIS, ROUTINE W REFLEX MICROSCOPIC
BILIRUBIN URINE: NEGATIVE
Glucose, UA: NEGATIVE mg/dL
KETONES UR: NEGATIVE mg/dL
NITRITE: NEGATIVE
PROTEIN: 100 mg/dL — AB
Specific Gravity, Urine: 1.016 (ref 1.005–1.030)
Urobilinogen, UA: 0.2 mg/dL (ref 0.0–1.0)
pH: 5.5 (ref 5.0–8.0)

## 2014-10-23 LAB — I-STAT CG4 LACTIC ACID, ED
Lactic Acid, Venous: 1.09 mmol/L (ref 0.5–2.0)
Lactic Acid, Venous: 1.18 mmol/L (ref 0.5–2.0)

## 2014-10-23 LAB — URINE MICROSCOPIC-ADD ON

## 2014-10-23 LAB — PROTIME-INR
INR: 9.07 (ref 0.00–1.49)
INR: 2.04 — ABNORMAL HIGH (ref 0.00–1.49)
Prothrombin Time: 74.5 seconds — ABNORMAL HIGH (ref 11.6–15.2)
Prothrombin Time: 23.2 seconds — ABNORMAL HIGH (ref 11.6–15.2)

## 2014-10-23 LAB — CLOSTRIDIUM DIFFICILE BY PCR: CDIFFPCR: NEGATIVE

## 2014-10-23 LAB — CK: Total CK: 3234 U/L — ABNORMAL HIGH (ref 7–232)

## 2014-10-23 LAB — MAGNESIUM
Magnesium: 0.4 mg/dL — CL (ref 1.5–2.5)
Magnesium: 0.4 mg/dL — CL (ref 1.5–2.5)

## 2014-10-23 LAB — PHOSPHORUS: PHOSPHORUS: 5.1 mg/dL — AB (ref 2.3–4.6)

## 2014-10-23 MED ORDER — SODIUM CHLORIDE 0.9 % IJ SOLN
3.0000 mL | Freq: Two times a day (BID) | INTRAMUSCULAR | Status: DC
  Administered 2014-10-23 – 2014-10-25 (×4): 3 mL via INTRAVENOUS

## 2014-10-23 MED ORDER — CLOPIDOGREL BISULFATE 75 MG PO TABS
75.0000 mg | ORAL_TABLET | Freq: Every day | ORAL | Status: DC
  Filled 2014-10-23: qty 1

## 2014-10-23 MED ORDER — VITAMIN K1 10 MG/ML IJ SOLN
5.0000 mg | Freq: Once | INTRAMUSCULAR | Status: AC
  Administered 2014-10-23: 5 mg via INTRAVENOUS
  Filled 2014-10-23: qty 0.5

## 2014-10-23 MED ORDER — SODIUM CHLORIDE 0.9 % IV BOLUS (SEPSIS)
1000.0000 mL | Freq: Once | INTRAVENOUS | Status: AC
  Administered 2014-10-23: 1000 mL via INTRAVENOUS

## 2014-10-23 MED ORDER — ONDANSETRON HCL 4 MG/2ML IJ SOLN
4.0000 mg | Freq: Four times a day (QID) | INTRAMUSCULAR | Status: DC | PRN
  Administered 2014-10-24: 4 mg via INTRAVENOUS
  Filled 2014-10-23: qty 2

## 2014-10-23 MED ORDER — MORPHINE SULFATE 2 MG/ML IJ SOLN
2.0000 mg | INTRAMUSCULAR | Status: DC | PRN
  Administered 2014-10-23 – 2014-10-24 (×2): 2 mg via INTRAVENOUS
  Filled 2014-10-23 (×2): qty 1

## 2014-10-23 MED ORDER — WARFARIN - PHARMACIST DOSING INPATIENT
Freq: Every day | Status: DC
  Administered 2014-10-24: 18:00:00

## 2014-10-23 MED ORDER — MAGNESIUM OXIDE 400 (241.3 MG) MG PO TABS
400.0000 mg | ORAL_TABLET | Freq: Two times a day (BID) | ORAL | Status: DC
  Administered 2014-10-23 – 2014-10-25 (×5): 400 mg via ORAL
  Filled 2014-10-23 (×6): qty 1

## 2014-10-23 MED ORDER — ONDANSETRON HCL 4 MG PO TABS
4.0000 mg | ORAL_TABLET | Freq: Four times a day (QID) | ORAL | Status: DC | PRN
  Administered 2014-10-23: 4 mg via ORAL
  Filled 2014-10-23: qty 1

## 2014-10-23 MED ORDER — WARFARIN SODIUM 5 MG PO TABS
5.0000 mg | ORAL_TABLET | Freq: Once | ORAL | Status: AC
  Administered 2014-10-23: 5 mg via ORAL
  Filled 2014-10-23: qty 1

## 2014-10-23 MED ORDER — SODIUM CHLORIDE 0.9 % IV SOLN
INTRAVENOUS | Status: DC
  Administered 2014-10-23 – 2014-10-25 (×8): via INTRAVENOUS

## 2014-10-23 MED ORDER — PANTOPRAZOLE SODIUM 40 MG PO TBEC
40.0000 mg | DELAYED_RELEASE_TABLET | Freq: Every day | ORAL | Status: DC
  Administered 2014-10-23 – 2014-10-25 (×3): 40 mg via ORAL
  Filled 2014-10-23 (×4): qty 1

## 2014-10-23 MED ORDER — ACETAMINOPHEN 325 MG PO TABS
650.0000 mg | ORAL_TABLET | Freq: Four times a day (QID) | ORAL | Status: DC | PRN
  Administered 2014-10-23: 650 mg via ORAL
  Filled 2014-10-23: qty 2

## 2014-10-23 MED ORDER — ACETAMINOPHEN 650 MG RE SUPP: 650.0000 mg | Freq: Four times a day (QID) | RECTAL | Status: DC | PRN

## 2014-10-23 MED ORDER — ROSUVASTATIN CALCIUM 40 MG PO TABS
40.0000 mg | ORAL_TABLET | Freq: Every day | ORAL | Status: DC
  Administered 2014-10-23: 40 mg via ORAL
  Filled 2014-10-23: qty 1

## 2014-10-23 MED ORDER — ONDANSETRON HCL 4 MG/2ML IJ SOLN
4.0000 mg | Freq: Once | INTRAMUSCULAR | Status: AC
  Administered 2014-10-23: 4 mg via INTRAVENOUS
  Filled 2014-10-23: qty 2

## 2014-10-23 NOTE — Progress Notes (Signed)
CRITICAL VALUE ALERT  Critical value received:  0.4 magnesium  Date of notification:  10/23/2013  Time of notification:  0830  Critical value read back:Yes.    Nurse who received alert:  Philippa Sicks  MD notified (1st page):  yes  Time of first page:  [phone]  MD notified (2nd page):0  Time of second page:0  Responding MD: melancon    Time MD responded 1032

## 2014-10-23 NOTE — Progress Notes (Signed)
ANTICOAGULATION CONSULT NOTE - Initial Consult  Pharmacy Consult for Coumadin Indication: atrial fibrillation  Allergies  Allergen Reactions  . Codeine Hives  . Ketorolac Tromethamine     REACTION: hives    Patient Measurements: Height: 5' 6"  (167.6 cm) Weight: 142 lb (64.411 kg) IBW/kg (Calculated) : 63.8  Vital Signs: Temp: 97.8 F (36.6 C) (02/12 1353) Temp Source: Oral (02/12 1353) BP: 108/69 mmHg (02/12 1353) Pulse Rate: 48 (02/12 1353)  Labs:  Recent Labs  10/22/14 2249 10/22/14 2358 10/23/14 0654 10/23/14 1210 10/23/14 1626  HGB 13.6  --   --   --   --   HCT 38.8*  --   --   --   --   PLT 207  --   --   --   --   LABPROT  --  74.5*  --   --  23.2*  INR  --  9.07*  --   --  2.04*  CREATININE 5.75*  --  4.63* 3.86*  --   CKTOTAL  --   --  3234*  --   --     Estimated Creatinine Clearance: 18.1 mL/min (by C-G formula based on Cr of 3.86).   Medical History: Past Medical History  Diagnosis Date  . Anemia   . Hypertension   . Gout   . Atrial fibrillation   . GERD (gastroesophageal reflux disease)   . Hyperlipidemia   . Herpes   . RHINITIS, ALLERGIC 11/08/2006  . Herpes genitalia 04/13/2011  . CAD (coronary artery disease)     Branch Vessel  . Tobacco abuse   . Alcohol abuse   . Cocaine abuse     Hx of  . BPH 11/08/2006  . Myocardial infarction 2005    Medications:  Prescriptions prior to admission  Medication Sig Dispense Refill Last Dose  . amLODipine (NORVASC) 10 MG tablet TAKE 1 TABLET (10 MG TOTAL) BY MOUTH DAILY. 90 tablet 3 10/22/2014 at Unknown time  . clopidogrel (PLAVIX) 75 MG tablet Take 75 mg by mouth daily.  5 10/22/2014 at Unknown time  . CRESTOR 40 MG tablet TAKE 1 TABLET BY MOUTH EVERY DAY 90 tablet 3 10/22/2014 at Unknown time  . febuxostat (ULORIC) 40 MG tablet Take 1 tablet (40 mg total) by mouth daily. 30 tablet 2 10/22/2014 at Unknown time  . NEXIUM 40 MG capsule TAKE ONE CAPSULE BY MOUTH EVERY DAY 90 capsule 0 10/22/2014 at  Unknown time  . nitroGLYCERIN (NITROSTAT) 0.4 MG SL tablet Place 1 tablet (0.4 mg total) under the tongue as directed. Place 1 tab under tongue as directed 25 tablet 6 unknown  . potassium chloride SA (K-DUR,KLOR-CON) 20 MEQ tablet Take 1 tablet (20 mEq total) by mouth daily. 30 tablet 3 10/22/2014 at Unknown time  . predniSONE (DELTASONE) 50 MG tablet Take 1 tablet (50 mg total) by mouth daily with breakfast. 5 tablet 0 10/22/2014 at Unknown time  . ramipril (ALTACE) 10 MG capsule TAKE ONE CAPSULE BY MOUTH TWICE A DAY 180 capsule 3   . traMADol (ULTRAM) 50 MG tablet Take 1 tablet (50 mg total) by mouth every 8 (eight) hours as needed. 30 tablet 0 10/22/2014 at Unknown time  . warfarin (COUMADIN) 5 MG tablet Take 5-7.5 mg by mouth daily. Take 1 and 1/2 tablets on Wednesday then take 1 tablet all the other days   10/22/2014 at Unknown time  . benzonatate (TESSALON) 100 MG capsule Take 1 capsule (100 mg total) by mouth 2 (two) times  daily. (Patient not taking: Reported on 10/23/2014) 20 capsule 0 Not Taking at Unknown time  . guaiFENesin (MUCINEX) 600 MG 12 hr tablet Take 1 tablet (600 mg total) by mouth 2 (two) times daily. (Patient not taking: Reported on 10/23/2014) 30 tablet 0 Not Taking at Unknown time  . sildenafil (VIAGRA) 100 MG tablet Take 0.5-1 tablets (50-100 mg total) by mouth daily as needed for erectile dysfunction. (Patient not taking: Reported on 07/31/2014) 5 tablet 6 Taking  . warfarin (COUMADIN) 5 MG tablet TAKE AS DIRECTED BY ANTICOAGULATION CLINIC (Patient not taking: Reported on 10/23/2014) 40 tablet 1 Not Taking at Unknown time   Scheduled:  . magnesium oxide  400 mg Oral BID  . pantoprazole  40 mg Oral Daily  . sodium chloride  3 mL Intravenous Q12H  . Warfarin - Pharmacist Dosing Inpatient   Does not apply q1800   Infusions:  . sodium chloride 150 mL/hr at 10/23/14 1448    Assessment: 64yo male c/o hemoptysis, N/V/D, and abdominal pain, found w/ AKI (SCr 5, baseline 1) likely  2/2 emesis/diarrhea, also with INR ~9 (last outpt INR check was 2.3 on 1/21), to be admitted and continue anticoag when appropriate.  Goal of Therapy:  INR 2-3   Plan:  Vitamin K 56m IV has been ordered; will hold Coumadin for now and monitor INR for dose adjustments.  VWynona Neat PharmD, BCPS  10/23/2014,5:18 PM  Addedum  INR tonight is 2.02 so it's ok to resume coumadin. The vit k is going to make INR resistant.   Plan   Coumadin 576mPO x1 Daily INR  MiOnnie BoerPharmD Pager: [phone]/08/2015 5:26 PM

## 2014-10-23 NOTE — ED Notes (Signed)
Attempted report 

## 2014-10-23 NOTE — Progress Notes (Signed)
ANTICOAGULATION CONSULT NOTE - Initial Consult  Pharmacy Consult for Coumadin Indication: atrial fibrillation  Allergies  Allergen Reactions  . Codeine Hives  . Ketorolac Tromethamine     REACTION: hives    Patient Measurements: Height: 5' 6"  (167.6 cm) Weight: 142 lb (64.411 kg) IBW/kg (Calculated) : 63.8  Vital Signs: Temp: 98.2 F (36.8 C) (02/12 0222) BP: 95/65 mmHg (02/12 0416) Pulse Rate: 71 (02/12 0416)  Labs:  Recent Labs  10/22/14 2249 10/22/14 2358  HGB 13.6  --   HCT 38.8*  --   PLT 207  --   LABPROT  --  74.5*  INR  --  9.07*  CREATININE 5.75*  --     Estimated Creatinine Clearance: 12.2 mL/min (by C-G formula based on Cr of 5.75).   Medical History: Past Medical History  Diagnosis Date  . Anemia   . Hypertension   . Gout   . Atrial fibrillation   . GERD (gastroesophageal reflux disease)   . Hyperlipidemia   . Herpes   . RHINITIS, ALLERGIC 11/08/2006  . Herpes genitalia 04/13/2011  . CAD (coronary artery disease)     Branch Vessel  . Tobacco abuse   . Alcohol abuse   . Cocaine abuse     Hx of  . BPH 11/08/2006  . Myocardial infarction 2005    Medications:  Prescriptions prior to admission  Medication Sig Dispense Refill Last Dose  . amLODipine (NORVASC) 10 MG tablet TAKE 1 TABLET (10 MG TOTAL) BY MOUTH DAILY. 90 tablet 3 10/22/2014 at Unknown time  . clopidogrel (PLAVIX) 75 MG tablet Take 75 mg by mouth daily.  5 10/22/2014 at Unknown time  . CRESTOR 40 MG tablet TAKE 1 TABLET BY MOUTH EVERY DAY 90 tablet 3 10/22/2014 at Unknown time  . febuxostat (ULORIC) 40 MG tablet Take 1 tablet (40 mg total) by mouth daily. 30 tablet 2 10/22/2014 at Unknown time  . NEXIUM 40 MG capsule TAKE ONE CAPSULE BY MOUTH EVERY DAY 90 capsule 0 10/22/2014 at Unknown time  . nitroGLYCERIN (NITROSTAT) 0.4 MG SL tablet Place 1 tablet (0.4 mg total) under the tongue as directed. Place 1 tab under tongue as directed 25 tablet 6 unknown  . potassium chloride SA  (K-DUR,KLOR-CON) 20 MEQ tablet Take 1 tablet (20 mEq total) by mouth daily. 30 tablet 3 10/22/2014 at Unknown time  . predniSONE (DELTASONE) 50 MG tablet Take 1 tablet (50 mg total) by mouth daily with breakfast. 5 tablet 0 10/22/2014 at Unknown time  . ramipril (ALTACE) 10 MG capsule TAKE ONE CAPSULE BY MOUTH TWICE A DAY 180 capsule 3   . traMADol (ULTRAM) 50 MG tablet Take 1 tablet (50 mg total) by mouth every 8 (eight) hours as needed. 30 tablet 0 10/22/2014 at Unknown time  . warfarin (COUMADIN) 5 MG tablet Take 5-7.5 mg by mouth daily. Take 1 and 1/2 tablets on Wednesday then take 1 tablet all the other days   10/22/2014 at Unknown time  . benzonatate (TESSALON) 100 MG capsule Take 1 capsule (100 mg total) by mouth 2 (two) times daily. (Patient not taking: Reported on 10/23/2014) 20 capsule 0 Not Taking at Unknown time  . guaiFENesin (MUCINEX) 600 MG 12 hr tablet Take 1 tablet (600 mg total) by mouth 2 (two) times daily. (Patient not taking: Reported on 10/23/2014) 30 tablet 0 Not Taking at Unknown time  . sildenafil (VIAGRA) 100 MG tablet Take 0.5-1 tablets (50-100 mg total) by mouth daily as needed for erectile dysfunction. (Patient  not taking: Reported on 07/31/2014) 5 tablet 6 Taking  . warfarin (COUMADIN) 5 MG tablet TAKE AS DIRECTED BY ANTICOAGULATION CLINIC (Patient not taking: Reported on 10/23/2014) 40 tablet 1 Not Taking at Unknown time   Scheduled:  . clopidogrel  75 mg Oral Daily  . pantoprazole  40 mg Oral Daily  . phytonadione (VITAMIN K) IV  5 mg Intravenous Once  . rosuvastatin  40 mg Oral Daily  . sodium chloride  1,000 mL Intravenous Once  . sodium chloride  3 mL Intravenous Q12H   Infusions:  . sodium chloride      Assessment: 64yo male c/o hemoptysis, N/V/D, and abdominal pain, found w/ AKI (SCr 5, baseline 1) likely 2/2 emesis/diarrhea, also with INR ~9 (last outpt INR check was 2.3 on 1/21), to be admitted and continue anticoag when appropriate.  Goal of Therapy:  INR  2-3   Plan:  Vitamin K 6m IV has been ordered; will hold Coumadin for now and monitor INR for dose adjustments.  VWynona Neat PharmD, BCPS  10/23/2014,6:20 AM

## 2014-10-23 NOTE — Progress Notes (Signed)
PATIENT ARRIVED TO TELE UNIT FROM E.D. VIA STRETCHER. TRANSFERRED TO BED. TELE APPLIED AND ASSESSMENT PERFORMED.  BED LOCKED AND IN LOWEST POSITION. CALL BELL WITHIN REACH.

## 2014-10-23 NOTE — H&P (Signed)
[facility] Admission History and Physical Service Pager: [phone]  Patient name: Jesse Franklin Medical record number: [id number] Date of birth: October 27, 1950 Age: 64 y.o. Gender: male  Primary Care Provider: Chrisandra Netters, MD Consultants: Renal (needs to be called if Cr not improving) Code Status: Full (confirmed on admission)  Chief Complaint: Nausea / Vomiting, Diarrhea, Abdominal Pain  Assessment and Plan: Jesse Franklin is a 64 y.o. male presenting with n/v, abdominal pain for 1-2 weeks, similar to previous viral gastroenteritis, now found to have moderate dehydration with AKI . PMH is significant for CAD, AFib (anticoag coumadin), HTN, HLD, gout (L-knee, s/p drain 10/12/14).  # Nausea / Vomiting / Abd Pain / Diarrhea - with secondary mod dehydration and met acidosis d/t GI losses: - Similar presentation to previous admission in 07/2014 with vomiting / diarrhea suspected due to viral gastroenteritis. No prior history of C Diff (neg at that time). Denies recent antibiotics. Poor PO additionally, and clinically consistent with about 10 lb wt loss over that time. No other prior illnesses, no sick contacts. Symptoms somewhat improved today after treatment in ED, s/p IVF rehydration, zofran, dilaudid. Abdominal pain had been improving with each BM. Consider acute pancreatitis component with elevated lipase to 92. Lactic Acid 1.18 >> 1.09 reassuring, unlikely sepsis. CT Abd/pelvis - no acute cause for abd pain, see below for details. - Admit to FPTS, telemetry, Dr. Mingo Amber - s/p 2L bolus in ED - NS @ 150cc/hr for rehydration - Initially NPO >> advance to clear liquids as tolerated - s/p Dilaudid 25m in ED - Morphine 260mq 2 hr PRN - Zofran PRN - trend Lipase - BCx, Urine culture  # AKI, in setting of previously normal kidney function Likely secondary to significant GI losses with vomiting / diarrhea with likely gastroenteritis. - Baseline Cr about 1.00 -  Elevated Cr to 5.6 >> improved to 4.63 in AM - Hold Ramipril (nephrotoxic agents) - Trend Cr >> repeat again at 1200 and in AM - Anticipate consulting Renal later today if stable or worsening Cr  # Suprathrapeutic INR (Coumadin anticoagulation for AFib) - On admission elevated to 9.07, without history of active bleeding. Likely due to current illness with poor PO, GI loss - Last INR 2.3 (10/01/14). Followed by Cardiology for INR checks - Vitamin K 5 mg IV x 1 dose - Repeat PT / INR @ 1600 - Hold Coumadin - Consult pharm for coumadin dosing  # Atrial Fibrillation, chronic - Stable, currently in AFib without RVR confirmed on EKG - Hold Coumadin - (ordered consult per pharm)  # Gout, s/p recent flare, L-knee - Recent intermittent gout flare L-knee since 08/2014 with multiple office visits, [name]. Had been treated with Prednisone burst, Tramadol. - last uric acid 4.3 (10/06/14) - Hold Uloric - Discontinued old prednisone burst on med list  # H/o CAD - Stable no active CP - Hold Plavix 7555maily (currently with elevated INR, also because of conflicting information with last discharge summary in 07/2014 advised to continue coumadin and DC plavix. However, pt admits to taking at home - Continue rosuvastatin 61m23mily  # HTN - BP low-stable, 100s/70s - Hold Amlodipine  # GERD - Continue PPI  FEN/GI: NS @ 125cc/hr, clear liquids AAT Prophylaxis: anticoagulated with coumadin (currently holding with supratherapeutic INR)  Disposition: Admit to FPTS inpatient, worsening GI losses likely d/t viral gastro, now dehydrated, AKI, supratherapeutic INR without active bleeding. Continue IVF  rehydration, reverse INR, trend kidney function, consult renal if worsening. Will need PT/OT eval prior to discharge determine if [facility] vs SNF.  History of Present Illness: Jesse Franklin is a 64 y.o. male presenting with abdominal pain, nausea, vomiting for about 2  week. Gradually worsening over that time. Sytmpoms started with Left knee gout flare, few days later after taking Tramadol, stated he started losing appetite with metallic taste, then developed nausea, vomiting (some small amount improved), diarrhea (watery, dark green, foul smell, doesn't look at it so doesn't know if has blood), bilateral lower abdominal pain intermittent lasting 10-28mn sharp stabbing, only improved with BMs, repeat episode of diarrhea every 30 min, slowed down some today (improved with medicine in ER). Described 154 wt to 147 loss in 1 week.   Takes Coumadin daily and Plavix for h/o MI s/p CABG in 2005, followed by Cardiology (Dr. NCathie Olden  Lives by self at home. All ADLs, functional. No sick contacts.  Review Of Systems: Per HPI with the following additions: none Otherwise 12 point review of systems was performed and was unremarkable.  Patient Active Problem List   Diagnosis Date Noted  . AKI (acute kidney injury) 10/23/2014  . Gout flare 10/12/2014  . Baker's cyst of knee 10/06/2014  . Diarrhea   . Anginal chest pain at rest 07/31/2014  . Metabolic acidosis 150/56/9794 . Chest pain   . Nausea with vomiting   . Pulmonary hypertension 12/02/2013  . Encounter for therapeutic drug monitoring 11/20/2013  . Hx of long term use of blood thinners 01/11/2013  . Influenza vaccination declined 05/24/2012  . Olecranon bursitis of left elbow 03/18/2012  . Erectile dysfunction 08/25/2011  . Knee pain, bilateral 05/04/2011  . Herpes genitalia 04/13/2011  . Unspecified deficiency anemia 05/31/2010  . Gout 04/14/2009  . HYPERTENSION, BENIGN ESSENTIAL 12/11/2006  . HYPERCHOLESTEROLEMIA 11/08/2006  . SCHIZOPHRENIA 11/08/2006  . DEPRESSION, MAJOR, RECURRENT 11/08/2006  . TOBACCO DEPENDENCE 11/08/2006  . CORONARY, ARTERIOSCLEROSIS 11/08/2006  . Atrial fibrillation 11/08/2006  . GASTROESOPHAGEAL REFLUX, NO ESOPHAGITIS 11/08/2006  . BPH 11/08/2006   Past Medical History: Past  Medical History  Diagnosis Date  . Anemia   . Hypertension   . Gout   . Atrial fibrillation   . GERD (gastroesophageal reflux disease)   . Hyperlipidemia   . Herpes   . RHINITIS, ALLERGIC 11/08/2006  . Herpes genitalia 04/13/2011  . CAD (coronary artery disease)     [place]  . Tobacco abuse   . Alcohol abuse   . Cocaine abuse     Hx of  . BPH 11/08/2006  . Myocardial infarction 2005   Past Surgical History: Past Surgical History  Procedure Laterality Date  . Laceration repair Right 03/1988    "hand"  . Cardiac catheterization  08/2004   Social History: History  Substance Use Topics  . Smoking status: Current Every Day Smoker -- 0.10 packs/day for 48 years    Types: Cigarettes  . Smokeless tobacco: Former [employer]   Types: Chew    Quit date: 09/12/1967     Comment: Chewed Red Man Tobacco  . Alcohol Use: No     Comment: Pt states that he quit since last gout flare up   Additional social history: Admits history prior drug use > 10 years (none currently. Denies alcohol or smoking.  Please also refer to relevant sections of EMR.  Family History: Family History  Problem Relation Age of Onset  . Alcohol abuse Father   . Heart disease  Brother   . Alcohol abuse Brother   . Cancer Sister   . Hypertension Mother   . Gout Mother    Allergies and Medications: Allergies  Allergen Reactions  . Codeine Hives  . Ketorolac Tromethamine     REACTION: hives   Current Facility-Administered Medications on File Prior to Encounter  Medication Dose Route Frequency Provider Last Rate Last Dose  . 0.9 %  sodium chloride infusion   Intravenous Continuous Willeen Niece, MD 125 mL/hr at 07/31/14 1200     Current Outpatient Prescriptions on File Prior to Encounter  Medication Sig Dispense Refill  . amLODipine (NORVASC) 10 MG tablet TAKE 1 TABLET (10 MG TOTAL) BY MOUTH DAILY. 90 tablet 3  . CRESTOR 40 MG tablet TAKE 1 TABLET BY MOUTH EVERY DAY 90 tablet 3  . febuxostat (ULORIC) 40 MG  tablet Take 1 tablet (40 mg total) by mouth daily. 30 tablet 2  . NEXIUM 40 MG capsule TAKE ONE CAPSULE BY MOUTH EVERY DAY 90 capsule 0  . nitroGLYCERIN (NITROSTAT) 0.4 MG SL tablet Place 1 tablet (0.4 mg total) under the tongue as directed. Place 1 tab under tongue as directed 25 tablet 6  . potassium chloride SA (K-DUR,KLOR-CON) 20 MEQ tablet Take 1 tablet (20 mEq total) by mouth daily. 30 tablet 3  . predniSONE (DELTASONE) 50 MG tablet Take 1 tablet (50 mg total) by mouth daily with breakfast. 5 tablet 0  . ramipril (ALTACE) 10 MG capsule TAKE ONE CAPSULE BY MOUTH TWICE A DAY 180 capsule 3  . traMADol (ULTRAM) 50 MG tablet Take 1 tablet (50 mg total) by mouth every 8 (eight) hours as needed. 30 tablet 0  . benzonatate (TESSALON) 100 MG capsule Take 1 capsule (100 mg total) by mouth 2 (two) times daily. (Patient not taking: Reported on 10/23/2014) 20 capsule 0  . guaiFENesin (MUCINEX) 600 MG 12 hr tablet Take 1 tablet (600 mg total) by mouth 2 (two) times daily. (Patient not taking: Reported on 10/23/2014) 30 tablet 0  . sildenafil (VIAGRA) 100 MG tablet Take 0.5-1 tablets (50-100 mg total) by mouth daily as needed for erectile dysfunction. (Patient not taking: Reported on 07/31/2014) 5 tablet 6  . warfarin (COUMADIN) 5 MG tablet TAKE AS DIRECTED BY ANTICOAGULATION CLINIC (Patient not taking: Reported on 10/23/2014) 40 tablet 1    Objective: BP 110/66 mmHg  Pulse 61  Temp(Src) 98.2 F (36.8 C)  Resp 23  Ht _0  (1.676 m)  Wt 154 lb (69.854 kg)  BMI 24.87 kg/m2  SpO2 99% Exam: General: thin appearing elderly gentleman, appears comfortable, cooperative, NAD HEENT: NCAT, PERRL, EOMI, oropharynx clear with dry mucus membranes and tongue Cardiovascular: RRR, no murmurs Respiratory: CTAB. No wheezing, crackles. Non-labored. Good air movement. Speaks full sentences Abdomen: soft, generalized lower abdominal tenderness to palpation, non-localizing, neg McBurneys, no rebound, no guarding, active  bowel sounds Extremities: moves all ext symmetric, non-tender, no edema MSK: Left knee - normal appearance, non-tender, no erythema, no edema. Skin: warm, dry Neuro: awake, alert, oriented x3, grossly non-focal  Labs and Imaging: CBC BMET   Recent Labs Lab 10/22/14 2249  WBC 5.0  HGB 13.6  HCT 38.8*  PLT 207    Recent Labs Lab 10/22/14 2249  NA 143  K 4.5  CL 118*  CO2 11*  BUN 94*  CREATININE 5.75*  GLUCOSE 113*  CALCIUM 7.9*     EKG - Atrial fibrillation  Lipase - 92  INR - 9.07  Lactic acid - 1.18 >>  1.09  CK - pending  UA trace leuks, neg nit, large hgb, RBC 21-50, WBC 0-2  2/12 CT Abd Pelvis IMPRESSION: No acute process demonstrated in the abdomen or pelvis. Small esophageal hiatal hernia. Multiple hepatic lesions presumably representing cysts. Probable right renal cyst. No evidence of bowel obstruction.  Nobie Putnam, DO 10/23/2014, 3:08 AM PGY-2, [place] Intern pager: [phone], text pages welcome

## 2014-10-23 NOTE — Clinical Social Work Note (Signed)
CSW spoke with patient who states he lives at home alone and is ready to return there at time of DC- patient feels safe to go home and feels as if he is moving around well enough to be home alone.  Per RN patient has been walking around in the room on his own without assistance.  CSW signing off.  Domenica Reamer, [place] Social Worker [phone]

## 2014-10-23 NOTE — ED Notes (Signed)
Transporting patient to new room assignment. 

## 2014-10-24 DIAGNOSIS — E860 Dehydration: Secondary | ICD-10-CM | POA: Insufficient documentation

## 2014-10-24 LAB — URINALYSIS, ROUTINE W REFLEX MICROSCOPIC
Bilirubin Urine: NEGATIVE
GLUCOSE, UA: NEGATIVE mg/dL
Ketones, ur: NEGATIVE mg/dL
LEUKOCYTES UA: NEGATIVE
Nitrite: NEGATIVE
Protein, ur: NEGATIVE mg/dL
SPECIFIC GRAVITY, URINE: 1.009 (ref 1.005–1.030)
UROBILINOGEN UA: 0.2 mg/dL (ref 0.0–1.0)
pH: 6 (ref 5.0–8.0)

## 2014-10-24 LAB — BASIC METABOLIC PANEL
Anion gap: 7 (ref 5–15)
Anion gap: 8 (ref 5–15)
BUN: 48 mg/dL — AB (ref 6–23)
BUN: 39 mg/dL — AB (ref 6–23)
CALCIUM: 6.4 mg/dL — AB (ref 8.4–10.5)
CHLORIDE: 120 mmol/L — AB (ref 96–112)
CO2: 14 mmol/L — ABNORMAL LOW (ref 19–32)
CO2: 13 mmol/L — AB (ref 19–32)
CREATININE: 2.59 mg/dL — AB (ref 0.50–1.35)
Calcium: 6.3 mg/dL — CL (ref 8.4–10.5)
Chloride: 120 mmol/L — ABNORMAL HIGH (ref 96–112)
Creatinine, Ser: 2.28 mg/dL — ABNORMAL HIGH (ref 0.50–1.35)
GFR calc Af Amer: 29 mL/min — ABNORMAL LOW (ref >90)
GFR calc non Af Amer: 29 mL/min — ABNORMAL LOW (ref >90)
GFR, EST AFRICAN AMERICAN: 34 mL/min — AB (ref >90)
GFR, EST NON AFRICAN AMERICAN: 25 mL/min — AB (ref >90)
Glucose, Bld: 95 mg/dL (ref 70–99)
Glucose, Bld: 146 mg/dL — ABNORMAL HIGH (ref 70–99)
POTASSIUM: 3.7 mmol/L (ref 3.5–5.1)
Potassium: 3.9 mmol/L (ref 3.5–5.1)
SODIUM: 141 mmol/L (ref 135–145)
Sodium: 141 mmol/L (ref 135–145)

## 2014-10-24 LAB — URINE MICROSCOPIC-ADD ON

## 2014-10-24 LAB — PROTIME-INR
INR: 1.67 — AB (ref 0.00–1.49)
Prothrombin Time: 19.9 seconds — ABNORMAL HIGH (ref 11.6–15.2)

## 2014-10-24 LAB — CBC
HEMATOCRIT: 30 % — AB (ref 39.0–52.0)
HEMOGLOBIN: 10.4 g/dL — AB (ref 13.0–17.0)
MCH: 30.1 pg (ref 26.0–34.0)
MCHC: 34.7 g/dL (ref 30.0–36.0)
MCV: 86.7 fL (ref 78.0–100.0)
Platelets: 163 10*3/uL (ref 150–400)
RBC: 3.46 MIL/uL — AB (ref 4.22–5.81)
RDW: 14 % (ref 11.5–15.5)
WBC: 3.9 10*3/uL — ABNORMAL LOW (ref 4.0–10.5)

## 2014-10-24 LAB — MAGNESIUM
Magnesium: 0.4 mg/dL — CL (ref 1.5–2.5)
Magnesium: 1 mg/dL — ABNORMAL LOW (ref 1.5–2.5)

## 2014-10-24 LAB — LIPASE, BLOOD: Lipase: 57 U/L (ref 11–59)

## 2014-10-24 MED ORDER — MAGNESIUM SULFATE 2 GM/50ML IV SOLN
2.0000 g | Freq: Once | INTRAVENOUS | Status: AC
  Administered 2014-10-24: 2 g via INTRAVENOUS
  Filled 2014-10-24: qty 50

## 2014-10-24 MED ORDER — SODIUM CHLORIDE 0.9 % IV SOLN
1.0000 g | Freq: Once | INTRAVENOUS | Status: AC
  Administered 2014-10-24: 1 g via INTRAVENOUS
  Filled 2014-10-24: qty 10

## 2014-10-24 MED ORDER — WARFARIN SODIUM 7.5 MG PO TABS
7.5000 mg | ORAL_TABLET | Freq: Once | ORAL | Status: AC
Start: 2014-10-24 — End: 2014-10-24
  Administered 2014-10-24: 7.5 mg via ORAL
  Filled 2014-10-24: qty 1

## 2014-10-24 MED ORDER — MAGNESIUM SULFATE 2 GM/50ML IV SOLN
2.0000 g | Freq: Once | INTRAVENOUS | Status: AC
Start: 2014-10-24 — End: 2014-10-24
  Administered 2014-10-24: 2 g via INTRAVENOUS
  Filled 2014-10-24: qty 50

## 2014-10-24 NOTE — Progress Notes (Signed)
[facility] for Coumadin Indication: atrial fibrillation  Allergies  Allergen Reactions  . Codeine Hives  . Ketorolac Tromethamine     REACTION: hives    Patient Measurements: Height: 5' 6"  (167.6 cm) Weight: 152 lb 8.9 oz (69.2 kg) IBW/kg (Calculated) : 63.8  Vital Signs: Temp: 98.6 F (37 C) (02/13 0605) Temp Source: Oral (02/13 0605) BP: 107/68 mmHg (02/13 0605) Pulse Rate: 92 (02/13 0605)  Labs:  Recent Labs  10/22/14 2249 10/22/14 2358 10/23/14 0654 10/23/14 1210 10/23/14 1626 10/24/14 0525  HGB 13.6  --   --   --   --  10.4*  HCT 38.8*  --   --   --   --  30.0*  PLT 207  --   --   --   --  163  LABPROT  --  74.5*  --   --  23.2* 19.9*  INR  --  9.07*  --   --  2.04* 1.67*  CREATININE 5.75*  --  4.63* 3.86*  --  2.59*  CKTOTAL  --   --  3234*  --   --   --     Estimated Creatinine Clearance: 27 mL/min (by C-G formula based on Cr of 2.59).   Assessment: 64yo male c/o hemoptysis, N/V/D, and abdominal pain, found w/ AKI (SCr 5, baseline 1) likely 2/2 emesis/diarrhea, also with INR ~9 (last outpt INR check was 2.3 on 1/21), to be admitted and continue anticoag when appropriate.  INR now down to 1.67 following Vitamin K  Dose PTA = 7.5 mg on Wednesdays, 5 mg other days  Goal of Therapy:  INR 2-3   Plan:  Coumadin 7.5 mg po x 1 Daily INR  Thank you. Anette Guarneri, PharmD [phone]  10/24/2014,10:40 AM

## 2014-10-24 NOTE — Progress Notes (Signed)
CRITICAL VALUE ALERT  Critical value received:  Magnesium 0.4  Date of notification:  10/24/14  Time of notification:  1205  Critical value read back:Yes.    Nurse who received alert:  Marilu Favre  MD notified (1st page):  Dr. Gerlean Ren  Time of first page:  1206   MD notified (2nd page):  Time of second page:  Responding MD:  Dr. Gerlean Ren  Time MD responded:  1209

## 2014-10-24 NOTE — Progress Notes (Signed)
BEDREST WITH BATHROOM PRIVILEGES PRN THIS SHIFT. TYLENOL FOR MILD ABDOMINAL PAIN AT BEDTIME, AND ZOFRAN FOR NAUSEA PREVENTION.  INSTRUCTED TO CALL FOR ASSISTANCE WHEN NEEDED.

## 2014-10-24 NOTE — Progress Notes (Signed)
Dr. Kayleen Memos notified of patient's Calcium of 6.3. [place]le[place], RN

## 2014-10-24 NOTE — Progress Notes (Signed)
Family Medicine Teaching Service Daily Progress Note Intern Pager: [phone]  Patient name: Jesse Franklin Medical record number: [id number] Date of birth: December 06, 1950 Age: 64 y.o. Gender: male  Primary Care Provider: Chrisandra Netters, MD Consultants: None Code Status: Full  Assessment and Plan: 64 y.o. male presenting with n/v, abdominal pain for 1-2 weeks, similar to previous viral gastroenteritis, now found to have moderate dehydration with AKI . PMH is significant for CAD, AFib (anticoag coumadin), HTN, HLD, gout (L-knee, s/p drain 10/12/14).  # Nausea / Vomiting / Abd Pain / Diarrhea - with secondary mod dehydration and met acidosis d/t GI losses:  suspected due to viral gastroenteritis - Afebrile - WBC 3.9 - Lactic Acid 1.09 - C difficile negative - Follow up blood and urine cultures - CT abdomen/pelvis- no acute process demonstrated, small esophageal hiatal hernia, multiple hepatic lesions most likely representing cysts, probably right renal cyst - Received 2L bolus in ED, currently on NS @ 150cc/hr for rehydration - Initially NPO >> advance to clear liquids as tolerated - Morphine 21m q 2 hr PRN - Zofran PRN  # AKI, in setting of previously normal kidney function- Baseline Creatinine 1. Creatinine 5.6 at admission. Likely secondary to significant GI losses with vomiting / diarrhea with likely gastroenteritis. - Creatinine 2.59 this morning - Hold Ramipril (nephrotoxic agents) - Consider consulting Nephrology if creatinine fails to improve  # Hypocalcemia- Calcium 7.1 at admission - Calcium 6.3 this morning - Calcium Gluconate 1g in 1021mNS, discussed with Pharmacy - Recheck BMP in afternoon  # Hypomagnesemia- Magnesium 0.4 at admission - Magnesium 0.4 today - Magnesium Oxide 40044mID - Magnesium Sulfate IV 2g x1  # Suprathrapeutic INR (Coumadin anticoagulation for AFib)- INR at admission 9.07. INR of 2.3 on 10/01/14. - Vitamin K 5 mg IV x 1 dose given 1/12 - INR 1.67  this morning - Hold Coumadin - Consult pharm for coumadin dosing - Will continue to monitor  # Atrial Fibrillation, chronic - Stable, currently in AFib without RVR confirmed on EKG - Hold Coumadin - (ordered consult per pharm)  # H/o CAD - Stable no active CP - Hold Plavix 47m43mily (currently with elevated INR, also because of conflicting information with last discharge summary in 07/2014 advised to continue coumadin and DC plavix. However, pt admits to taking at home - Continue rosuvastatin 40mg72mly  # HTN - BP low-stable, 100s/70s - Hold Amlodipine  FEN/GI: NS @ 125cc/hr, clear liquids AAT Prophylaxis: anticoagulated with coumadin (currently holding with supratherapeutic INR)  Disposition: Discharge home with improvement of AKI and Hypocalcemia. Refuses SNF placement.  Subjective:  No acute complaints overnight. Denies diarrhea, nausea, vomiting, and abdominal pain. States he feels well and would like to go home. Discussed that it would be best to wait until kidneys are functioning better and Calcium and Magnesium are improved.  Objective: Temp:  [97.8 F (36.6 C)-98.6 F (37 C)] 98.6 F (37 C) (02/13 0605) Pulse Rate:  [48-92] 92 (02/13 0605) Resp:  [17-20] 17 (02/13 0605) BP: (103-114)/(68-71) 107/68 mmHg (02/13 0605) SpO2:  [100 %] 100 % (02/13 0605) Weight:  [152 lb 8.9 oz (69.2 kg)] 152 lb 8.9 oz (69.2 kg) (02/13 0605)0092sical Exam: General: 63yo 64yo resting comfortably in no apparent distress Cardiovascular: S1 and S2 noted. No murmurs/rubs/gallops. Regular rate and irregular rhythm. Respiratory: Clear to auscultation bilaterally. No wheezes/rales/rhonchi. No increased work of breathing. Abdomen: Bowel sounds noted. Soft and non-distended. No tenderness to palpation. Extremities: No edema noted.  Laboratory:  Recent Labs Lab 10/22/14 2249 10/24/14 0525  WBC 5.0 3.9*  HGB 13.6 10.4*  HCT 38.8* 30.0*  PLT 207 163    Recent Labs Lab 10/22/14 2249  10/23/14 0654 10/23/14 1210 10/24/14 0525  NA 143 145 145 141  K 4.5 4.3 4.4 3.7  CL 118* 123* 122* 120*  CO2 11* 12* 11* 14*  BUN 94* 86* 75* 48*  CREATININE 5.75* 4.63* 3.86* 2.59*  CALCIUM 7.9* 7.1* 6.8* 6.3*  PROT 8.4* 6.9  --   --   BILITOT 1.0 0.8  --   --   ALKPHOS 100 83  --   --   ALT 44 40  --   --   AST 74* 68*  --   --   GLUCOSE 113* 86 78 95   Urinalysis    Component Value Date/Time   COLORURINE YELLOW 10/23/2014 0300   APPEARANCEUR CLOUDY* 10/23/2014 0300   LABSPEC 1.016 10/23/2014 0300   PHURINE 5.5 10/23/2014 0300   GLUCOSEU NEGATIVE 10/23/2014 0300   HGBUR LARGE* 10/23/2014 0300   HGBUR trace-intact 02/19/2007 1427   BILIRUBINUR NEGATIVE 10/23/2014 0300   KETONESUR NEGATIVE 10/23/2014 0300   PROTEINUR 100* 10/23/2014 0300   UROBILINOGEN 0.2 10/23/2014 0300   NITRITE NEGATIVE 10/23/2014 0300   LEUKOCYTESUR TRACE* 10/23/2014 0300  - CK 3234 - Troponin negative - Lactic Acid 1.09 - PT 19.9 - INR 1.67 - C difficile negative  Imaging/Diagnostic Tests: Ct Abdomen Pelvis Wo Contrast  10/23/2014   CLINICAL DATA:  Low abdominal pain with nausea, vomiting, diarrhea, and he clips for 2 weeks. Oral contrast only due to elevated creatinine.  EXAM: CT ABDOMEN AND PELVIS WITHOUT CONTRAST  TECHNIQUE: Multidetector CT imaging of the abdomen and pelvis was performed following the standard protocol without IV contrast.  COMPARISON:  09/02/2004  FINDINGS: Lung bases are clear.  Small esophageal hiatal hernia.  Motion artifact limits evaluation of the upper abdomen. Multiple circumscribed low-attenuation lesions are demonstrated throughout the liver, with the largest measuring about 18 mm diameter. These of been present on the prior study and probably represent multiple cysts. Characterization is limited due to size and lack of IV contrast material. The unenhanced appearance of the gallbladder, spleen, pancreas, adrenal glands, abdominal aorta, inferior vena cava, and  retroperitoneal lymph nodes is unremarkable. Vague low-attenuation lesion in the upper pole of the right kidney corresponds to a cyst seen on prior study. No hydronephrosis in either kidney. Stomach is decompressed. Small bowel are not abnormally distended. No wall thickening demonstrated in the contrast filled loops. Distal loops are decompressed. Stool throughout the colon. No colonic distention and no specific wall thickening identified. No free air or free fluid in the abdomen. Small umbilical hernia containing fat.  Pelvis: No free or loculated pelvic fluid collections. The appendix is normal. No evidence of diverticulitis. Mild enlargement of the prostate gland. Bladder is decompressed. Fat in the inguinal canals bilaterally. Degenerative changes in the lumbar spine. No destructive bone lesions appreciated.  IMPRESSION: No acute process demonstrated in the abdomen or pelvis. Small esophageal hiatal hernia. Multiple hepatic lesions presumably representing cysts. Probable right renal cyst. No evidence of bowel obstruction.   Electronically Signed   By: Lucienne Capers M.D.   On: 10/23/2014 02:37    Lorna Few, DO 10/24/2014, 8:37 AM PGY-1, [place] Intern pager: [phone], text pages welcome

## 2014-10-24 NOTE — Evaluation (Signed)
Physical Therapy Evaluation and Discharge Patient Details Name: Jesse Franklin MRN: [id number] DOB: Sep 17, 1950 Today's Date: 10/24/2014   History of Present Illness  Jesse Franklin is a 64 y.o. male presenting with n/v, abdominal pain for 1-2 weeks, similar to previous viral gastroenteritis, now found to have moderate dehydration with AKI . PMH is significant for CAD, AFib (anticoag coumadin), HTN, HLD, gout (L-knee, s/p drain 10/12/14).  Clinical Impression  Patient evaluated by Physical Therapy with no further acute PT needs identified. All education has been completed and the patient has no further questions. States he has been ambulating in his room independently, also noted in previous notes during this stay. Complains only of Lt knee pain with Hx of gout. Ambulates generally well and uses a cane at baseline due to his knee pain. Safely completed stair training. See below for any follow-up Physial Therapy or equipment needs. PT is signing off. Thank you for this referral.     Follow Up Recommendations Home health PT (Although patient has declined this service)    Equipment Recommendations  None recommended by PT    Recommendations for Other Services       Precautions / Restrictions Precautions Precautions: None Restrictions Weight Bearing Restrictions: No      Mobility  Bed Mobility Overal bed mobility: Independent                Transfers Overall transfer level: Modified independent               General transfer comment: Reaches for IV pole for support but no loss of balance noted  Ambulation/Gait Ambulation/Gait assistance: Supervision Ambulation Distance (Feet): 300 Feet Assistive device:  (Held IV pole, declines use of RW) Gait Pattern/deviations: Step-through pattern;Antalgic   Gait velocity interpretation: Below normal speed for age/gender General Gait Details: Mildly antalgic pattern with decreased speed due to Lt knee pain with Hx of gout. Declines  use of RW for bilateral UE support but reaches for rail in hallway. Pt with no loss of balance and did not require physical assist to ambulate safely.  Became fatigued briefly at 150 feet and required a short standing rest break.  Stairs Stairs: Yes Stairs assistance: Supervision Stair Management: One rail Right;Step to pattern Number of Stairs: 3 General stair comments: VC to safely ascend steps using single rail similar to home environment. Pt completed this task without need for physical assistance.  Wheelchair Mobility    Modified Rankin (Stroke Patients Only)       Balance                                             Pertinent Vitals/Pain Pain Assessment: 0-10 Pain Score: 8  Pain Location: left knee Pain Descriptors / Indicators: Burning Pain Intervention(s): Limited activity within patient's tolerance;Monitored during session;Repositioned  HR 83 at end of therapy session, monitor indicates 15 PVCs/min    Home Living Family/patient expects to be discharged to:: Private residence Living Arrangements: Alone   Type of Home: Apartment Home Access: Stairs to enter Entrance Stairs-Rails: Psychiatric nurse of Steps: 3 Home Layout: Two level Home Equipment: Cane - single point      Prior Function Level of Independence: Independent with assistive device(s)         Comments: cane for ambulation     Hand Dominance   Dominant Hand: Left    Extremity/Trunk  Assessment   Upper Extremity Assessment: Defer to OT evaluation           Lower Extremity Assessment: Overall WFL for tasks assessed (complains of Lt knee pain with ROM)      Cervical / Trunk Assessment: Normal  Communication   Communication: No difficulties  Cognition Arousal/Alertness: Awake/alert Behavior During Therapy: WFL for tasks assessed/performed Overall Cognitive Status: Within Functional Limits for tasks assessed                      General  Comments General comments (skin integrity, edema, etc.): Discussed use of assistive devices at home. Pt prefers his cane and does not wish to try using a rolling walker.    Exercises        Assessment/Plan    PT Assessment All further PT needs can be met in the next venue of care  PT Diagnosis Generalized weakness;Acute pain   PT Problem List Decreased strength;Decreased range of motion;Decreased activity tolerance;Decreased balance;Decreased mobility;Decreased knowledge of use of DME;Pain  PT Treatment Interventions     PT Goals (Current goals can be found in the Care Plan section) Acute Rehab PT Goals Patient Stated Goal: Go home PT Goal Formulation: All assessment and education complete, DC therapy    Frequency     Barriers to discharge        Co-evaluation               End of Session Equipment Utilized During Treatment: Gait belt Activity Tolerance: Patient tolerated treatment well Patient left: in chair;with call bell/phone within reach Nurse Communication: Mobility status         Time: 0851-0908 PT Time Calculation (min) (ACUTE ONLY): 17 min   Charges:   PT Evaluation $Initial PT Evaluation Tier I: 1 Procedure     PT G CodesEllouise Newer 10/24/2014, 9:17 AM Elayne Snare, [place]

## 2014-10-25 LAB — CBC
HEMATOCRIT: 28.6 % — AB (ref 39.0–52.0)
HEMOGLOBIN: 9.8 g/dL — AB (ref 13.0–17.0)
MCH: 30.3 pg (ref 26.0–34.0)
MCHC: 34.3 g/dL (ref 30.0–36.0)
MCV: 88.5 fL (ref 78.0–100.0)
PLATELETS: 144 10*3/uL — AB (ref 150–400)
RBC: 3.23 MIL/uL — ABNORMAL LOW (ref 4.22–5.81)
RDW: 14 % (ref 11.5–15.5)
WBC: 3.4 10*3/uL — ABNORMAL LOW (ref 4.0–10.5)

## 2014-10-25 LAB — BASIC METABOLIC PANEL
Anion gap: 4 — ABNORMAL LOW (ref 5–15)
Anion gap: 11 (ref 5–15)
BUN: 30 mg/dL — ABNORMAL HIGH (ref 6–23)
BUN: 25 mg/dL — ABNORMAL HIGH (ref 6–23)
CO2: 19 mmol/L (ref 19–32)
CO2: 17 mmol/L — AB (ref 19–32)
CREATININE: 1.72 mg/dL — AB (ref 0.50–1.35)
Calcium: 6.7 mg/dL — ABNORMAL LOW (ref 8.4–10.5)
Calcium: 7.7 mg/dL — ABNORMAL LOW (ref 8.4–10.5)
Chloride: 120 mmol/L — ABNORMAL HIGH (ref 96–112)
Chloride: 117 mmol/L — ABNORMAL HIGH (ref 96–112)
Creatinine, Ser: 1.77 mg/dL — ABNORMAL HIGH (ref 0.50–1.35)
GFR calc Af Amer: 46 mL/min — ABNORMAL LOW (ref >90)
GFR calc Af Amer: 48 mL/min — ABNORMAL LOW (ref >90)
GFR calc non Af Amer: 40 mL/min — ABNORMAL LOW (ref >90)
GFR, EST NON AFRICAN AMERICAN: 41 mL/min — AB (ref >90)
Glucose, Bld: 94 mg/dL (ref 70–99)
Glucose, Bld: 104 mg/dL — ABNORMAL HIGH (ref 70–99)
Potassium: 3.4 mmol/L — ABNORMAL LOW (ref 3.5–5.1)
Potassium: 3.4 mmol/L — ABNORMAL LOW (ref 3.5–5.1)
SODIUM: 143 mmol/L (ref 135–145)
SODIUM: 145 mmol/L (ref 135–145)

## 2014-10-25 LAB — URINE CULTURE

## 2014-10-25 LAB — PROTIME-INR
INR: 1.63 — AB (ref 0.00–1.49)
PROTHROMBIN TIME: 19.5 s — AB (ref 11.6–15.2)

## 2014-10-25 LAB — MAGNESIUM
Magnesium: 1.3 mg/dL — ABNORMAL LOW (ref 1.5–2.5)
Magnesium: 1.2 mg/dL — ABNORMAL LOW (ref 1.5–2.5)

## 2014-10-25 MED ORDER — CALCIUM GLUCONATE 10 % IV SOLN
1.0000 g | Freq: Once | INTRAVENOUS | Status: AC
  Administered 2014-10-25: 1 g via INTRAVENOUS
  Filled 2014-10-25: qty 10

## 2014-10-25 MED ORDER — WARFARIN SODIUM 7.5 MG PO TABS
7.5000 mg | ORAL_TABLET | ORAL | Status: AC
  Administered 2014-10-25: 7.5 mg via ORAL
  Filled 2014-10-25: qty 1

## 2014-10-25 MED ORDER — MAGNESIUM OXIDE 400 (241.3 MG) MG PO TABS: 400.0000 mg | ORAL_TABLET | Freq: Two times a day (BID) | ORAL | Status: DC

## 2014-10-25 MED ORDER — CALCIUM CARBONATE ANTACID 500 MG PO CHEW: 1.0000 | CHEWABLE_TABLET | Freq: Every day | ORAL | Status: DC

## 2014-10-25 MED ORDER — WARFARIN SODIUM 4 MG PO TABS: 4.0000 mg | ORAL_TABLET | Freq: Once | ORAL | Status: DC

## 2014-10-25 NOTE — Progress Notes (Signed)
Family Medicine Teaching Service Daily Progress Note Intern Pager: [phone]  Patient name: Jesse Franklin Medical record number: [id number] Date of birth: [birth year] Age: 64 y.o. Gender: male  Primary Care Provider: Chrisandra Netters, MD Consultants: None Code Status: Full  Assessment and Plan: 64 y.o. male presenting with n/v, abdominal pain for 1-2 weeks, similar to previous viral gastroenteritis, now found to have moderate dehydration with AKI . PMH is significant for CAD, AFib (anticoag coumadin), HTN, HLD, gout (L-knee, s/p drain 10/12/14).  # Nausea / Vomiting / Abd Pain / Diarrhea - with secondary mod dehydration and met acidosis d/t GI losses:  suspected due to viral gastroenteritis - Resolved - Afebrile - WBC 3.9 - Lactic Acid 1.09 - C difficile negative - Bld/ Urine Cx - pending.  - CT abdomen/pelvis- no acute process demonstrated, small esophageal hiatal hernia, multiple hepatic lesions most likely representing cysts, probably right renal cyst - Rehydrated with MIVF.  - Initially NPO >> Tolerating full diet.  - Morphine 60m q 2 hr PRN - Zofran PRN  # AKI, in setting of previously normal kidney function- Baseline Creatinine 1. Creatinine 5.6 at admission. Likely secondary to significant GI losses with vomiting / diarrhea with likely gastroenteritis. - Creatinine 2.59 > 2.28 > 1.77 - Hold Ramipril (nephrotoxic agents)  # Hypocalcemia- Calcium 7.1 at admission - Calcium 6.7 this morning >> send home on calcium supplement until recheck at follow up.  - Calcium Gluconate 1g in 1081mNS prior to discharge.   # Hypomagnesemia- Magnesium 0.4 at admission - Magnesium 1.3 from 1.0 today.  - Magnesium Oxide 40064mID - Magnesium Sulfate IV 2g x1 yesterday.  - Will discharge on oral magnesium.   # Suprathrapeutic INR (Coumadin anticoagulation for AFib)- INR at admission 9.07. INR of 2.3 on 10/01/14. - Vitamin K 5 mg IV x 1 dose given 1/12 - INR 1.67 to 1.63 this am.  - Hold  Coumadin - Consult pharm for coumadin dosing - Will restart home coumadin prior to discharge.   # Atrial Fibrillation, chronic - Stable, currently in AFib without RVR confirmed on EKG. Rate controlled.   # H/o CAD - Stable no active CP - Hold Plavix 43m13mily (currently with elevated INR, also because of conflicting information with last discharge summary in 07/2014 advised to continue coumadin and DC plavix. However, pt admits to taking at home - Continue rosuvastatin 40mg51mly  # HTN - BP low-stable, 100s/70s - Hold Amlodipine  FEN/GI: NS @ 125cc/hr, clear liquids AAT Prophylaxis: anticoagulated with coumadin (currently holding with supratherapeutic INR)  Disposition: Discharge home with improvement of AKI and Hypocalcemia. Refuses SNF placement.  Subjective:  No acute events overnight. Would like to go home today. No nausea, vomiting or diarrhea. No other complaints this am.   Objective: Temp:  [98.1 F (36.7 C)-98.6 F (37 C)] 98.1 F (36.7 C) (02/14 0415) Pulse Rate:  [71-92] 85 (02/14 0415) Resp:  [17-20] 20 (02/14 0415) BP: (83-107)/(59-73) 99/69 mmHg (02/14 0415) SpO2:  [100 %] 100 % (02/14 0415) Weight:  [152 lb 8.9 oz (69.2 kg)-156 lb 3.2 oz (70.852 kg)] 156 lb 3.2 oz (70.852 kg) (02/14 0415) Physical Exam: General: NAD, AAOx3 Cardiovascular: Atrial fibrillation, No MGR, Normal S1/S2.  Respiratory: Clear to auscultation bilaterally. No wheezes/rales/rhonchi. No increased work of breathing. Abdomen: +BS, NT, ND, S, No organomegaly.  Extremities: No edema noted. 2+ distal pulses.   Laboratory:  Recent Labs Lab 10/22/14 2249 10/24/14 0525  WBC 5.0 3.9*  HGB 13.6 10.4*  HCT 38.8* 30.0*  PLT 207 163    Recent Labs Lab 10/22/14 2249 10/23/14 0654 10/23/14 1210 10/24/14 0525 10/24/14 1815  NA 143 145 145 141 141  K 4.5 4.3 4.4 3.7 3.9  CL 118* 123* 122* 120* 120*  CO2 11* 12* 11* 14* 13*  BUN 94* 86* 75* 48* 39*  CREATININE 5.75* 4.63* 3.86* 2.59*  2.28*  CALCIUM 7.9* 7.1* 6.8* 6.3* 6.4*  PROT 8.4* 6.9  --   --   --   BILITOT 1.0 0.8  --   --   --   ALKPHOS 100 83  --   --   --   ALT 44 40  --   --   --   AST 74* 68*  --   --   --   GLUCOSE 113* 86 78 95 146*   Urinalysis    Component Value Date/Time   COLORURINE YELLOW 10/24/2014 2200   APPEARANCEUR CLEAR 10/24/2014 2200   LABSPEC 1.009 10/24/2014 2200   PHURINE 6.0 10/24/2014 2200   GLUCOSEU NEGATIVE 10/24/2014 2200   HGBUR LARGE* 10/24/2014 2200   HGBUR trace-intact 02/19/2007 1427   BILIRUBINUR NEGATIVE 10/24/2014 2200   KETONESUR NEGATIVE 10/24/2014 2200   PROTEINUR NEGATIVE 10/24/2014 2200   UROBILINOGEN 0.2 10/24/2014 2200   NITRITE NEGATIVE 10/24/2014 2200   LEUKOCYTESUR NEGATIVE 10/24/2014 2200  - CK 3234 - Troponin negative - Lactic Acid 1.09 - PT 19.9 - INR 1.67 - C difficile negative  Imaging/Diagnostic Tests: Ct Abdomen Pelvis Wo Contrast  10/23/2014   CLINICAL DATA:  Low abdominal pain with nausea, vomiting, diarrhea, and he clips for 2 weeks. Oral contrast only due to elevated creatinine.  EXAM: CT ABDOMEN AND PELVIS WITHOUT CONTRAST  TECHNIQUE: Multidetector CT imaging of the abdomen and pelvis was performed following the standard protocol without IV contrast.  COMPARISON:  09/02/2004  FINDINGS: Lung bases are clear.  Small esophageal hiatal hernia.  Motion artifact limits evaluation of the upper abdomen. Multiple circumscribed low-attenuation lesions are demonstrated throughout the liver, with the largest measuring about 18 mm diameter. These of been present on the prior study and probably represent multiple cysts. Characterization is limited due to size and lack of IV contrast material. The unenhanced appearance of the gallbladder, spleen, pancreas, adrenal glands, abdominal aorta, inferior vena cava, and retroperitoneal lymph nodes is unremarkable. Vague low-attenuation lesion in the upper pole of the right kidney corresponds to a cyst seen on prior study.  No hydronephrosis in either kidney. Stomach is decompressed. Small bowel are not abnormally distended. No wall thickening demonstrated in the contrast filled loops. Distal loops are decompressed. Stool throughout the colon. No colonic distention and no specific wall thickening identified. No free air or free fluid in the abdomen. Small umbilical hernia containing fat.  Pelvis: No free or loculated pelvic fluid collections. The appendix is normal. No evidence of diverticulitis. Mild enlargement of the prostate gland. Bladder is decompressed. Fat in the inguinal canals bilaterally. Degenerative changes in the lumbar spine. No destructive bone lesions appreciated.  IMPRESSION: No acute process demonstrated in the abdomen or pelvis. Small esophageal hiatal hernia. Multiple hepatic lesions presumably representing cysts. Probable right renal cyst. No evidence of bowel obstruction.   Electronically Signed   By: Lucienne Capers M.D.   On: 10/23/2014 02:37    Aquilla Hacker, MD 10/25/2014, 4:43 AM PGY-1, [place] Intern pager: [phone], text pages welcome

## 2014-10-25 NOTE — Progress Notes (Signed)
Utilization Review Completed.Zahari Fazzino T2/14/2016  

## 2014-10-25 NOTE — Discharge Instructions (Signed)
Warfarin tablets What is this medicine? WARFARIN (WAR far in) is an anticoagulant. It is used to treat or prevent clots in the veins, arteries, lungs, or heart. This medicine may be used for other purposes; ask your health care provider or pharmacist if you have questions. COMMON BRAND NAME(S): Coumadin, Jantoven What should I tell my health care provider before I take this medicine? They need to know if you have any of these conditions: -alcoholism -anemia -bleeding disorders -cancer -diabetes -heart disease -high blood pressure -history of bleeding in the gastrointestinal tract -history of stroke or other brain injury or disease -kidney or liver disease -protein C deficiency -protein S deficiency -psychosis or dementia -recent injury, recent or planned surgery or procedure -an unusual or allergic reaction to warfarin, other medicines, foods, dyes, or preservatives -pregnant or trying to get pregnant -breast-feeding How should I use this medicine? Take this medicine by mouth with a glass of water. Follow the directions on the prescription label. You can take this medicine with or without food. Take your medicine at the same time each day. Do not take it more often than directed. Do not stop taking except on your doctor's advice. Stopping this medicine may increase your risk of a blood clot. Be sure to refill your prescription before you run out of medicine. If your doctor or healthcare professional calls to change your dose, write down the dose and any other instructions. Always read the dose and instructions back to him or her to make sure you understand them. Tell your doctor or healthcare professional what strength of tablets you have on hand. Ask how many tablets you should take to equal your new dose. Write the date on the new instructions and keep them near your medicine. If you are told to stop taking your medicine until your next blood test, call your doctor or healthcare  professional if you do not hear anything within 24 hours of the test to find out your new dose or when to restart your prior dose. A special MedGuide will be given to you by the pharmacist with each prescription and refill. Be sure to read this information carefully each time. Talk to your pediatrician regarding the use of this medicine in children. Special care may be needed. Overdosage: If you think you have taken too much of this medicine contact a poison control center or emergency room at once. NOTE: This medicine is only for you. Do not share this medicine with others. What if I miss a dose? It is important not to miss a dose. If you miss a dose, call your healthcare provider. Take the dose as soon as possible on the same day. If it is almost time for your next dose, take only that dose. Do not take double or extra doses to make up for a missed dose. What may interact with this medicine? Do not take this medicine with any of the following medications: -agents that prevent or dissolve blood clots -aspirin or other salicylates -danshen -dextrothyroxine -mifepristone -St. John's Wort -red yeast rice This medicine may also interact with the following medications: -acetaminophen -agents that lower cholesterol -alcohol -allopurinol -amiodarone -antibiotics or medicines for treating bacterial, fungal or viral infections -azathioprine -barbiturate medicines for inducing sleep or treating seizures -certain medicines for diabetes -certain medicines for heart rhythm problems -certain medicines for high blood pressure -chloral hydrate -cisapride -disulfiram -male hormones, including contraceptive or birth control pills -general anesthetics -herbal or dietary products like garlic, ginkgo, ginseng, green tea, or kava  kava -influenza virus vaccine -male hormones -medicines for mental depression or psychosis -medicines for some types of cancer -medicines for stomach  problems -methylphenidate -NSAIDs, medicines for pain and inflammation, like ibuprofen or naproxen -propoxyphene -quinidine, quinine -raloxifene -seizure or epilepsy medicine like carbamazepine, phenytoin, and valproic acid -steroids like cortisone and prednisone -tamoxifen -thyroid medicine -tramadol -vitamin c, vitamin e, and vitamin K -zafirlukast -zileuton This list may not describe all possible interactions. Give your health care provider a list of all the medicines, herbs, non-prescription drugs, or dietary supplements you use. Also tell them if you smoke, drink alcohol, or use illegal drugs. Some items may interact with your medicine. What should I watch for while using this medicine? Visit your doctor or health care professional for regular checks on your progress. You will need to have a blood test called a PT/INR regularly. The PT/INR blood test is done to make sure you are getting the right dose of this medicine. It is important to not miss your appointment for the blood tests. When you first start taking this medicine, these tests are done often. Once the correct dose is determined and you take your medicine properly, these tests can be done less often. Notify your doctor or health care professional and seek emergency treatment if you develop breathing problems; changes in vision; chest pain; severe, sudden headache; pain, swelling, warmth in the leg; trouble speaking; sudden numbness or weakness of the face, arm or leg. These can be signs that your condition has gotten worse. While you are taking this medicine, carry an identification card with your name, the name and dose of medicine(s) being used, and the name and phone number of your doctor or health care professional or person to contact in an emergency. Do not start taking or stop taking any medicines or over-the-counter medicines except on the advice of your doctor or health care professional. You should discuss your diet with  your doctor or health care professional. Do not make major changes in your diet. Vitamin K can affect how well this medicine works. Many foods contain vitamin K. It is important to eat a consistent amount of foods with vitamin K. Other foods with vitamin K that you should eat in consistent amounts are asparagus, basil, beef or pork liver, black eyed peas, broccoli, brussel sprouts, cabbage, chickpeas, cucumber with peel, green onions, green tea, okra, parsley, peas, thyme, and green leafy vegetables like beet greens, collard greens, endive, kale, mustard greens, spinach, turnip greens, watercress, or certain lettuces like green leaf or romaine. This medicine can cause birth defects or bleeding in an unborn child. Women of childbearing age should use effective birth control while taking this medicine. If a woman becomes pregnant while taking this medicine, she should discuss the potential risks and her options with her health care professional. Avoid sports and activities that might cause injury while you are using this medicine. Severe falls or injuries can cause unseen bleeding. Be careful when using sharp tools or knives. Consider using an Copy. Take special care brushing or flossing your teeth. Report any injuries, bruising, or red spots on the skin to your doctor or health care professional. If you have an illness that causes vomiting, diarrhea, or fever for more than a few days, contact your doctor. Also check with your doctor if you are unable to eat for several days. These problems can change the effect of this medicine. Even after you stop taking this medicine, it takes several days before your body recovers  its normal ability to clot blood. Ask your doctor or health care professional how long you need to be careful. If you are going to have surgery or dental work, tell your doctor or health care professional that you have been taking this medicine. What side effects may I notice from  receiving this medicine? Side effects that you should report to your doctor or health care professional as soon as possible: -back pain -chills -dizziness -fever -heavy menstrual bleeding or vaginal bleeding -painful, blue, or purple toes -painful, prolonged erection -signs and symptoms of bleeding such as bloody or black, tarry stools; red or dark-brown urine; spitting up blood or brown material that looks like coffee grounds; red spots on the skin; unusual bruising or bleeding from the eye, gums, or nose-skin rash, itching or skin damage -stomach pain -unusually weak or tired -yellowing of skin or eyes Side effects that usually do not require medical attention (report to your doctor or health care professional if they continue or are bothersome): -diarrhea -hair loss This list may not describe all possible side effects. Call your doctor for medical advice about side effects. You may report side effects to FDA at 1-800-FDA-1088. Where should I keep my medicine? Keep out of the reach of children. Store at room temperature between 15 and 30 degrees C (59 and 86 degrees F). Protect from light. Throw away any unused medicine after the expiration date. Do not flush down the toilet. NOTE: This sheet is a summary. It may not cover all possible information. If you have questions about this medicine, talk to your doctor, pharmacist, or health care provider.  2015, [employer]/Gold Standard. (2013-03-19 12:17:56)    We are glad that you are feeling better.   It is important to take your coumadin as prescribed. You should also take your magnesium and calcium as prescribed.   You will have a very important follow up appointment on 2/16 to check how well your coumadin is working.  We will also get labs on you at that time to evaluate your Magnesium and electrolyte levels.  It will be at the family practice center at 9:00 am.   Thanks for letting us take care of you!   Sincerely,  Paula Compton,  MD Family Medicine - PGY 1

## 2014-10-25 NOTE — Progress Notes (Signed)
Pt given d/c instructions; IV's d/c at this time; pt verbalized understanding; will cont. To monitor.

## 2014-10-25 NOTE — Progress Notes (Signed)
Pt up ambulating in hallway at this time; no complaints; will cont. To monitor.

## 2014-10-25 NOTE — Progress Notes (Signed)
[facility] for Coumadin Indication: atrial fibrillation  Allergies  Allergen Reactions  . Codeine Hives  . Ketorolac Tromethamine     REACTION: hives    Patient Measurements: Height: 5' 6"  (167.6 cm) Weight: 156 lb 3.2 oz (70.852 kg) IBW/kg (Calculated) : 63.8  Vital Signs: Temp: 98.1 F (36.7 C) (02/14 0415) Temp Source: Oral (02/14 0415) BP: 99/69 mmHg (02/14 0415) Pulse Rate: 85 (02/14 0415)  Labs:  Recent Labs  10/22/14 2249  10/23/14 0654  10/23/14 1626 10/24/14 0525 10/24/14 1815 10/25/14 0555  HGB 13.6  --   --   --   --  10.4*  --  9.8*  HCT 38.8*  --   --   --   --  30.0*  --  28.6*  PLT 207  --   --   --   --  163  --  144*  LABPROT  --   < >  --   --  23.2* 19.9*  --  19.5*  INR  --   < >  --   --  2.04* 1.67*  --  1.63*  CREATININE 5.75*  --  4.63*  < >  --  2.59* 2.28* 1.77*  CKTOTAL  --   --  3234*  --   --   --   --   --   < > = values in this interval not displayed.  Estimated Creatinine Clearance: 39.5 mL/min (by C-G formula based on Cr of 1.77).   Assessment: 64yo male c/o hemoptysis, N/V/D, and abdominal pain, found w/ AKI (SCr 5, baseline 1) likely 2/2 emesis/diarrhea, also with INR ~9 (last outpt INR check was 2.3 on 1/21), to be admitted and continue anticoag when appropriate.  INR now down to 1.63 following Vitamin K  Dose PTA = 7.5 mg on Wednesdays, 5 mg other days  Will give another dose of 7.5 mg po x 1 today prior to discharge, then 4 mg daily at home with INR check on Tuesday or Wednesday.  His dose may need to be adjusted based on how the vitamin K affects his INR.  Goal of Therapy:  INR 2-3   Plan:  Coumadin 7.5 mg po x 1 prior to discharge Daily INR  Thank you. Anette Guarneri, PharmD [phone]  10/25/2014,10:51 AM

## 2014-10-26 NOTE — Discharge Summary (Signed)
[facility] Discharge Summary  Patient name: TORIS LAVERDIERE Medical record number: [id number] Date of birth: October 25, 1950 Age: 64 y.o. Gender: male Date of Admission: 10/22/2014  Date of Discharge: 10/25/14 Admitting Physician: Alveda Reasons, MD  Primary Care Provider: Chrisandra Netters, MD Consultants: None  Indication for Hospitalization: Nausea, Vomiting, Abdominal Pain  Discharge Diagnoses/Problem List:  Gastroenteritis Hypocalcemia Hypomagnesemia HTN CAD Atrial Fibrillation Supratherapeutic INR Acute Kidney Injury GERD  Disposition: Discharge Home  Discharge Condition: Stable  Brief Hospital Course:  Mr. Chadderdon is a 64yo male who presented to the Emergency Department on 10/22/14 with nausea, vomiting, and abdominal pain and was found to have AKI with moderate dehydration. Received IV fluids in ED and continued throughout hospitalization. CT abdomen and pelvis showed no acute cause for abdominal pain. Mr. Cui was initially made NPO, but diet was advanced as tolerated.  Creatinine elevated to 5.6 at admission, but this improved throughout hospitalization and was 1.77 prior to discharge. Nephrotoxic medications, including Ramipril were held during hospitalization. Hypocalcemina noted with calcium 6.3. Calcium gluconate given. Calcium improved to 6.7 prior to discharge. Hypomagnesemia noted with magnesium of 0.4. Magnesium oxide 476m BID initiated and Magnesium sulfate given. Magnesium 1.3 at discharge.  Discharged home with resolution of nausea, vomiting, and abdominal pain. Discharged with calcium and magnesium supplementation.  INR was initially elevated to 9.07 and Vitamin K 52mwas given on 2/12. Coumadin was held. INR improved throughout hospitalization and was 1.63 prior to discharge.   Issues for Follow Up:  1. Follow up Calcium and Magnesium levels. Discharged with magnesium and calcium supplementation. 2. Follow up nausea, vomiting, and  abdominal pain. Resolved at discharge. 3. Monitor INR. Coumadin restarted at discharge. INR 1.63 at discharge. Advised to discontinue Plavix at previous hospitalization, however Mr. CaRiveroontinued. Follow up Med Rec.  Significant Procedures: None  Significant Labs and Imaging:   Recent Labs Lab 10/22/14 2249 10/24/14 0525 10/25/14 0555  WBC 5.0 3.9* 3.4*  HGB 13.6 10.4* 9.8*  HCT 38.8* 30.0* 28.6*  PLT 207 163 144*    Recent Labs Lab 10/22/14 2249  10/23/14 0654 10/23/14 1210 10/24/14 0525 10/24/14 1815 10/25/14 0555 10/25/14 1445  NA 143  --  145 145 141 141 143 145  K 4.5  --  4.3 4.4 3.7 3.9 3.4* 3.4*  CL 118*  --  123* 122* 120* 120* 120* 117*  CO2 11*  --  12* 11* 14* 13* 19 17*  GLUCOSE 113*  --  86 78 95 146* 94 104*  BUN 94*  --  86* 75* 48* 39* 30* 25*  CREATININE 5.75*  --  4.63* 3.86* 2.59* 2.28* 1.77* 1.72*  CALCIUM 7.9*  --  7.1* 6.8* 6.3* 6.4* 6.7* 7.7*  MG  --   < > 0.4* 0.4* 0.4* 1.0* 1.3* 1.2*  PHOS  --   --  5.1*  --   --   --   --   --   ALKPHOS 100  --  83  --   --   --   --   --   AST 74*  --  68*  --   --   --   --   --   ALT 44  --  40  --   --   --   --   --   ALBUMIN 3.8  --  3.2*  --   --   --   --   --   < > =  values in this interval not displayed. Urinalysis    Component Value Date/Time   COLORURINE YELLOW 10/24/2014 2200   APPEARANCEUR CLEAR 10/24/2014 2200   LABSPEC 1.009 10/24/2014 2200   PHURINE 6.0 10/24/2014 2200   GLUCOSEU NEGATIVE 10/24/2014 2200   HGBUR LARGE* 10/24/2014 2200   HGBUR trace-intact 02/19/2007 1427   BILIRUBINUR NEGATIVE 10/24/2014 2200   KETONESUR NEGATIVE 10/24/2014 2200   PROTEINUR NEGATIVE 10/24/2014 2200   UROBILINOGEN 0.2 10/24/2014 2200   NITRITE NEGATIVE 10/24/2014 2200   LEUKOCYTESUR NEGATIVE 10/24/2014 2200  - CK 3234 - Troponin negative - Lactic Acid 1.09  Ct Abdomen Pelvis Wo Contrast  10/23/2014   CLINICAL DATA:  Low abdominal pain with nausea, vomiting, diarrhea, and he clips for 2 weeks.  Oral contrast only due to elevated creatinine.  EXAM: CT ABDOMEN AND PELVIS WITHOUT CONTRAST  TECHNIQUE: Multidetector CT imaging of the abdomen and pelvis was performed following the standard protocol without IV contrast.  COMPARISON:  09/02/2004  FINDINGS: Lung bases are clear.  Small esophageal hiatal hernia.  Motion artifact limits evaluation of the upper abdomen. Multiple circumscribed low-attenuation lesions are demonstrated throughout the liver, with the largest measuring about 18 mm diameter. These of been present on the prior study and probably represent multiple cysts. Characterization is limited due to size and lack of IV contrast material. The unenhanced appearance of the gallbladder, spleen, pancreas, adrenal glands, abdominal aorta, inferior vena cava, and retroperitoneal lymph nodes is unremarkable. Vague low-attenuation lesion in the upper pole of the right kidney corresponds to a cyst seen on prior study. No hydronephrosis in either kidney. Stomach is decompressed. Small bowel are not abnormally distended. No wall thickening demonstrated in the contrast filled loops. Distal loops are decompressed. Stool throughout the colon. No colonic distention and no specific wall thickening identified. No free air or free fluid in the abdomen. Small umbilical hernia containing fat.  Pelvis: No free or loculated pelvic fluid collections. The appendix is normal. No evidence of diverticulitis. Mild enlargement of the prostate gland. Bladder is decompressed. Fat in the inguinal canals bilaterally. Degenerative changes in the lumbar spine. No destructive bone lesions appreciated.  IMPRESSION: No acute process demonstrated in the abdomen or pelvis. Small esophageal hiatal hernia. Multiple hepatic lesions presumably representing cysts. Probable right renal cyst. No evidence of bowel obstruction.   Electronically Signed   By: Lucienne Capers M.D.   On: 10/23/2014 02:37   Results/Tests Pending at Time of Discharge:  None  Discharge Medications:    Medication List    STOP taking these medications        clopidogrel 75 MG tablet  Commonly known as:  PLAVIX     predniSONE 50 MG tablet  Commonly known as:  DELTASONE     sildenafil 100 MG tablet  Commonly known as:  VIAGRA      TAKE these medications        amLODipine 10 MG tablet  Commonly known as:  NORVASC  TAKE 1 TABLET (10 MG TOTAL) BY MOUTH DAILY.     benzonatate 100 MG capsule  Commonly known as:  TESSALON  Take 1 capsule (100 mg total) by mouth 2 (two) times daily.     calcium carbonate 500 MG chewable tablet  Commonly known as:  TUMS  Chew 1 tablet (200 mg of elemental calcium total) by mouth daily.     CRESTOR 40 MG tablet  Generic drug:  rosuvastatin  TAKE 1 TABLET BY MOUTH EVERY DAY     febuxostat 40  MG tablet  Commonly known as:  ULORIC  Take 1 tablet (40 mg total) by mouth daily.     guaiFENesin 600 MG 12 hr tablet  Commonly known as:  MUCINEX  Take 1 tablet (600 mg total) by mouth 2 (two) times daily.     magnesium oxide 400 (241.3 MG) MG tablet  Commonly known as:  MAG-OX  Take 1 tablet (400 mg total) by mouth 2 (two) times daily.     NEXIUM 40 MG capsule  Generic drug:  esomeprazole  TAKE ONE CAPSULE BY MOUTH EVERY DAY     nitroGLYCERIN 0.4 MG SL tablet  Commonly known as:  NITROSTAT  Place 1 tablet (0.4 mg total) under the tongue as directed. Place 1 tab under tongue as directed     potassium chloride SA 20 MEQ tablet  Commonly known as:  K-DUR,KLOR-CON  Take 1 tablet (20 mEq total) by mouth daily.     ramipril 10 MG capsule  Commonly known as:  ALTACE  TAKE ONE CAPSULE BY MOUTH TWICE A DAY     traMADol 50 MG tablet  Commonly known as:  ULTRAM  Take 1 tablet (50 mg total) by mouth every 8 (eight) hours as needed.     warfarin 4 MG tablet  Commonly known as:  COUMADIN  Take 1 tablet (4 mg total) by mouth one time only at 6 PM. Take 1 and 1/2 tablets on Wednesday then take 1 tablet all the other  days        Discharge Instructions: Please refer to Patient Instructions section of EMR for full details.  Patient was counseled important signs and symptoms that should prompt return to medical care, changes in medications, dietary instructions, activity restrictions, and follow up appointments.   Follow-Up Appointments:     Follow-up Information    Follow up with Diona Fanti, DO. Go on 10/27/2014.   Specialty:  Family Medicine   Why:  Hospital Follow up at - 9:00 am.    Contact information:   [address] [id number] [place], DO 10/26/2014, 8:40 PM PGY-1, [place]

## 2014-10-27 ENCOUNTER — Ambulatory Visit (INDEPENDENT_AMBULATORY_CARE_PROVIDER_SITE_OTHER): Payer: Medicare Other | Admitting: Obstetrics and Gynecology

## 2014-10-27 ENCOUNTER — Telehealth: Payer: Self-pay | Admitting: Obstetrics and Gynecology

## 2014-10-27 ENCOUNTER — Encounter: Payer: Self-pay | Admitting: Obstetrics and Gynecology

## 2014-10-27 DIAGNOSIS — N179 Acute kidney failure, unspecified: Secondary | ICD-10-CM | POA: Diagnosis not present

## 2014-10-27 DIAGNOSIS — Z5181 Encounter for therapeutic drug level monitoring: Secondary | ICD-10-CM

## 2014-10-27 DIAGNOSIS — I4891 Unspecified atrial fibrillation: Secondary | ICD-10-CM

## 2014-10-27 DIAGNOSIS — M10062 Idiopathic gout, left knee: Secondary | ICD-10-CM

## 2014-10-27 DIAGNOSIS — R791 Abnormal coagulation profile: Secondary | ICD-10-CM

## 2014-10-27 DIAGNOSIS — I10 Essential (primary) hypertension: Secondary | ICD-10-CM

## 2014-10-27 DIAGNOSIS — M109 Gout, unspecified: Secondary | ICD-10-CM

## 2014-10-27 LAB — COMPREHENSIVE METABOLIC PANEL
ALBUMIN: 3.5 g/dL (ref 3.5–5.2)
ALK PHOS: 87 U/L (ref 39–117)
ALT: 94 U/L — AB (ref 0–53)
AST: 92 U/L — ABNORMAL HIGH (ref 0–37)
BUN: 16 mg/dL (ref 6–23)
CHLORIDE: 111 meq/L (ref 96–112)
CO2: 23 mEq/L (ref 19–32)
Calcium: 7.2 mg/dL — ABNORMAL LOW (ref 8.4–10.5)
Creat: 1.14 mg/dL (ref 0.50–1.35)
Glucose, Bld: 103 mg/dL — ABNORMAL HIGH (ref 70–99)
POTASSIUM: 4.1 meq/L (ref 3.5–5.3)
Sodium: 144 mEq/L (ref 135–145)
TOTAL PROTEIN: 6.4 g/dL (ref 6.0–8.3)
Total Bilirubin: 0.6 mg/dL (ref 0.2–1.2)

## 2014-10-27 LAB — POCT INR: INR: 3.4

## 2014-10-27 LAB — MAGNESIUM: Magnesium: <1 mg/dL — ABNORMAL LOW (ref 1.5–2.5)

## 2014-10-27 LAB — URIC ACID: Uric Acid, Serum: 4.3 mg/dL (ref 4.0–7.8)

## 2014-10-27 LAB — CK: Total CK: 3950 U/L — ABNORMAL HIGH (ref 7–232)

## 2014-10-27 MED ORDER — HYDROMORPHONE HCL 2 MG PO TABS: 1.0000 mg | ORAL_TABLET | Freq: Two times a day (BID) | ORAL | Status: DC | PRN

## 2014-10-27 MED ORDER — PREDNISONE 20 MG PO TABS: 20.0000 mg | ORAL_TABLET | Freq: Every day | ORAL | Status: DC

## 2014-10-27 NOTE — Assessment & Plan Note (Signed)
Recheck levels. Patient just got medication 2 days ago and had not taken any yet.

## 2014-10-27 NOTE — Assessment & Plan Note (Signed)
A: Acute gout flare currently of multiple joints. Mainly L. Knee but also involving R knee and R wrist.  P:  Patient to take Colchicine BID during flare.  Also given Rx for 5-day steroid burst.  Short course of pain medication given due to patient being in severe pain with distress.  Uric acid level ordered.  Patient also on Uloric for gout.

## 2014-10-27 NOTE — Telephone Encounter (Signed)
Discussed with Horris Latino that patient may be taking wrong dosage of warfarin. Patient did not know his correct dosage. Tried calling patient to have him read dosage off to me but unable to contact. Please continue to call patient and find out dosage of warfarin he is taking (tell him it is on the bottle). Will adjust after figuring out proper dosage.

## 2014-10-27 NOTE — Assessment & Plan Note (Signed)
A: Patient with low normal BPs today. 112/76. He did not take medication today. P: Recheck BP next week. Consider decreasing dose or stopping BP medication if still low. Dehydration could be a cause for low pressures at this time. patient advised to continue with hydration.  He is to hold BP medication today.

## 2014-10-27 NOTE — Assessment & Plan Note (Signed)
A: AKI noted on hospital admission with Cr of >5. At discharge Cr was down trending. Presumed to be related to dehydration. P: CMET ordered today. Patient advised to avoid any NSAIDs at this time. ACEi being held. Will follow-up labs.

## 2014-10-27 NOTE — Assessment & Plan Note (Signed)
A: Currently in A. Fib and on warfarin. Managed by Cardiology and gets INR checked at their offices. Patient may not be taking medications daily.  P: Patient with subtheraputic INR at hospital discharge. Will recheck INR today once. He has cardiology follow-up.

## 2014-10-27 NOTE — Assessment & Plan Note (Signed)
Recheck levels today. Patient just got medication 2 days ago and had not taken any yet.

## 2014-10-27 NOTE — Progress Notes (Signed)
Subjective: Chief Complaint  Patient presents with  . Hospitalization Follow-up    abdominal pain    HPI: Jesse Franklin is a 64 y.o. presenting to clinic today for hospital follow-up:    Bilateral leg pain:  -Severe bilateral knee pain left greater than right  -Pain scale 10/10 mostly in knees.  -Pain radiates down the entire leg  -Woke up this morning with pain  -Unable to bear weight  -Thinks either gout or arthiristis.   Gastro: -Patient recently discharged hospital for gastroenteritis -no longer has nausea vomiting or abdominal pain -Endorses increased thirst dehydration -Believes issue has resolved  Patient admits to not taking any of his medications today due to severe pain. This includes blood pressure medication war farin, vitamins, etc.   All systems were reviewed and were negative unless otherwise noted in the HPI Past Medical, Surgical, Social, and Family History Reviewed & Updated per EMR.   Objective: BP 112/76 mmHg  Pulse 87  Temp(Src) 97.8 F (36.6 C) (Oral)  Wt 156 lb (70.761 kg)  SpO2 96%  Physical Exam  Constitutional: He is oriented to person, place, and time. Vital signs are normal. He appears dehydrated. He appears distressed.  HENT:  Mouth/Throat: Oropharynx is clear and moist. Mucous membranes are pale and dry.  Eyes: EOM are normal.  Cardiovascular: Normal heart sounds and intact distal pulses.  An irregularly irregular rhythm present.  Pulmonary/Chest: Effort normal and breath sounds normal. No respiratory distress.  Musculoskeletal:       Left knee: He exhibits decreased range of motion, swelling and effusion. Tenderness found.  Neurological: He is alert and oriented to person, place, and time.  Skin: Skin is warm and dry. No rash noted. No erythema.    Results for orders placed or performed during the hospital encounter of 10/22/14 (from the past 72 hour(s))  Basic metabolic panel     Status: Abnormal   Collection Time: 10/24/14   6:15 PM  Result Value Ref Range   Sodium 141 135 - 145 mmol/L   Potassium 3.9 3.5 - 5.1 mmol/L   Chloride 120 (H) 96 - 112 mmol/L   CO2 13 (L) 19 - 32 mmol/L   Glucose, Bld 146 (H) 70 - 99 mg/dL   BUN 39 (H) 6 - 23 mg/dL   Creatinine, Ser 2.28 (H) 0.50 - 1.35 mg/dL   Calcium 6.4 (LL) 8.4 - 10.5 mg/dL    Comment: REPEATED TO VERIFY CRITICAL RESULT CALLED TO, READ BACK BY AND VERIFIED WITH: AGUIRRE R,RN 10/24/14 2013 WAYK    GFR calc non Af Amer 29 (L) >90 mL/min   GFR calc Af Amer 34 (L) >90 mL/min    Comment: (NOTE) The eGFR has been calculated using the CKD EPI equation. This calculation has not been validated in all clinical situations. eGFR's persistently <90 mL/min signify possible Chronic Kidney Disease.    Anion gap 8 5 - 15  Magnesium     Status: Abnormal   Collection Time: 10/24/14  6:15 PM  Result Value Ref Range   Magnesium 1.0 (L) 1.5 - 2.5 mg/dL  Urinalysis, Routine w reflex microscopic     Status: Abnormal   Collection Time: 10/24/14 10:00 PM  Result Value Ref Range   Color, Urine YELLOW YELLOW   APPearance CLEAR CLEAR   Specific Gravity, Urine 1.009 1.005 - 1.030   pH 6.0 5.0 - 8.0   Glucose, UA NEGATIVE NEGATIVE mg/dL   Hgb urine dipstick LARGE (A) NEGATIVE  Bilirubin Urine NEGATIVE NEGATIVE   Ketones, ur NEGATIVE NEGATIVE mg/dL   Protein, ur NEGATIVE NEGATIVE mg/dL   Urobilinogen, UA 0.2 0.0 - 1.0 mg/dL   Nitrite NEGATIVE NEGATIVE   Leukocytes, UA NEGATIVE NEGATIVE  Urine microscopic-add on     Status: Abnormal   Collection Time: 10/24/14 10:00 PM  Result Value Ref Range   Squamous Epithelial / LPF RARE RARE   RBC / HPF 11-20 <3 RBC/hpf   Bacteria, UA FEW (A) RARE   Urine-Other AMORPHOUS URATES/PHOSPHATES   Protime-INR     Status: Abnormal   Collection Time: 10/25/14  5:55 AM  Result Value Ref Range   Prothrombin Time 19.5 (H) 11.6 - 15.2 seconds   INR 1.63 (H) 0.00 - 1.61  Basic metabolic panel     Status: Abnormal   Collection Time: 10/25/14   5:55 AM  Result Value Ref Range   Sodium 143 135 - 145 mmol/L   Potassium 3.4 (L) 3.5 - 5.1 mmol/L   Chloride 120 (H) 96 - 112 mmol/L   CO2 19 19 - 32 mmol/L   Glucose, Bld 94 70 - 99 mg/dL   BUN 30 (H) 6 - 23 mg/dL   Creatinine, Ser 1.77 (H) 0.50 - 1.35 mg/dL   Calcium 6.7 (L) 8.4 - 10.5 mg/dL   GFR calc non Af Amer 40 (L) >90 mL/min   GFR calc Af Amer 46 (L) >90 mL/min    Comment: (NOTE) The eGFR has been calculated using the CKD EPI equation. This calculation has not been validated in all clinical situations. eGFR's persistently <90 mL/min signify possible Chronic Kidney Disease.    Anion gap 4 (L) 5 - 15  CBC     Status: Abnormal   Collection Time: 10/25/14  5:55 AM  Result Value Ref Range   WBC 3.4 (L) 4.0 - 10.5 K/uL   RBC 3.23 (L) 4.22 - 5.81 MIL/uL   Hemoglobin 9.8 (L) 13.0 - 17.0 g/dL   HCT 28.6 (L) 39.0 - 52.0 %   MCV 88.5 78.0 - 100.0 fL   MCH 30.3 26.0 - 34.0 pg   MCHC 34.3 30.0 - 36.0 g/dL   RDW 14.0 11.5 - 15.5 %   Platelets 144 (L) 150 - 400 K/uL  Magnesium     Status: Abnormal   Collection Time: 10/25/14  5:55 AM  Result Value Ref Range   Magnesium 1.3 (L) 1.5 - 2.5 mg/dL  Basic metabolic panel     Status: Abnormal   Collection Time: 10/25/14  2:45 PM  Result Value Ref Range   Sodium 145 135 - 145 mmol/L   Potassium 3.4 (L) 3.5 - 5.1 mmol/L   Chloride 117 (H) 96 - 112 mmol/L   CO2 17 (L) 19 - 32 mmol/L   Glucose, Bld 104 (H) 70 - 99 mg/dL   BUN 25 (H) 6 - 23 mg/dL   Creatinine, Ser 1.72 (H) 0.50 - 1.35 mg/dL   Calcium 7.7 (L) 8.4 - 10.5 mg/dL   GFR calc non Af Amer 41 (L) >90 mL/min   GFR calc Af Amer 48 (L) >90 mL/min    Comment: (NOTE) The eGFR has been calculated using the CKD EPI equation. This calculation has not been validated in all clinical situations. eGFR's persistently <90 mL/min signify possible Chronic Kidney Disease.    Anion gap 11 5 - 15  Magnesium     Status: Abnormal   Collection Time: 10/25/14  2:45 PM  Result Value Ref Range  Magnesium 1.2 (L) 1.5 - 2.5 mg/dL    Assessment/Plan: Please see problem based Assessment and Plan  Health Maintainance: Not discussed   Orders Placed This Encounter  Procedures  . Magnesium  . Comprehensive metabolic panel  . CK  . Uric Acid  . INR    Meds ordered this encounter  Medications  . predniSONE (DELTASONE) 20 MG tablet    Sig: Take 1 tablet (20 mg total) by mouth daily.    Dispense:  5 tablet    Refill:  0  . HYDROmorphone (DILAUDID) 2 MG tablet    Sig: Take 0.5 tablets (1 mg total) by mouth every 12 (twelve) hours as needed for severe pain.    Dispense:  6 tablet    Refill:  0    Luiz Blare, DO 10/27/2014, 10:05 AM PGY-1, [place]

## 2014-10-27 NOTE — Patient Instructions (Addendum)
Mr. Jesse Franklin it was good to see you tofay. I am sorryt that you are in so much pain.  Here are some of the things we discussed today: - I believe this is a gout flare. Please take colchiocne twice a day to help flare subside -Use pain medication as needed -It is important you take all your medications daily.  -Myself or your PCP will follow-up with your lab results -you will need to come in next week  New medications for gout flare: -Pain medication -prednisone for 5 days as prescribed.  Please follow-up on Wednesday for another appointment.   Thanks for allowing me to be a part of your care! Dr. Gerarda Fraction   Gout Gout is when your joints become red, sore, and swell (inflamed). This is caused by the buildup of uric acid crystals in the joints. Uric acid is a chemical that is normally in the blood. If the level of uric acid gets too high in the blood, these crystals form in your joints and tissues. Over time, these crystals can form into masses near the joints and tissues. These masses can destroy bone and cause the bone to look misshapen (deformed). HOME CARE   Do not take aspirin for pain.  Only take medicine as told by your doctor.  Rest the joint as much as you can. When in bed, keep sheets and blankets off painful areas.  Keep the sore joints raised (elevated).  Put warm or cold packs on painful joints. Use of warm or cold packs depends on which works best for you.  Use crutches if the painful joint is in your leg.  Drink enough fluids to keep your pee (urine) clear or pale yellow. Limit alcohol, sugary drinks, and drinks with fructose in them.  Follow your diet instructions. Pay careful attention to how much protein you eat. Include fruits, vegetables, whole grains, and fat-free or low-fat milk products in your daily diet. Talk to your doctor or dietitian about the use of coffee, vitamin C, and cherries. These may help lower uric acid levels.  Keep a healthy body weight. GET  HELP RIGHT AWAY IF:   You have watery poop (diarrhea), throw up (vomit), or have any side effects from medicines.  You do not feel better in 24 hours, or you are getting worse.  Your joint becomes suddenly more tender, and you have chills or a fever. MAKE SURE YOU:   Understand these instructions.  Will watch your condition.  Will get help right away if you are not doing well or get worse. Document Released: 06/06/2008 Document Revised: 01/12/2014 Document Reviewed: 04/10/2012 [facility] Patient Information 2015 [place], [place]. This information is not intended to replace advice given to you by your health care provider. Make sure you discuss any questions you have with your health care provider.

## 2014-10-28 ENCOUNTER — Telehealth: Payer: Self-pay | Admitting: Obstetrics and Gynecology

## 2014-10-28 NOTE — Telephone Encounter (Signed)
Have called 3x to discuss results of labs with patient but no answer/ left voice message. When he returns call please page myself or Dr. Ardelia Mems. Thank you.

## 2014-10-28 NOTE — Telephone Encounter (Signed)
LMTCB. Will try again later. Oletta Lamas, CMA.

## 2014-10-29 ENCOUNTER — Ambulatory Visit: Payer: Medicare Other | Admitting: Family Medicine

## 2014-10-29 ENCOUNTER — Telehealth: Payer: Self-pay | Admitting: Family Medicine

## 2014-10-29 LAB — CULTURE, BLOOD (ROUTINE X 2)
Culture: NO GROWTH
Culture: NO GROWTH

## 2014-10-29 NOTE — Telephone Encounter (Signed)
Late entry phone note: I called and spoke with Mr. Dorvil this morning to try and discuss his labwork. I asked if he could come in today for an appointment and attempted to explain results but pt stated he was in too much pain from gout to listen to his results or my instructions. I tried explaining that he should stop his statin and return ASAP for repeat labs, but he said he could not listen. He did agree to come in next week for an appointment. Scheduled him with me for my first available appt next week. Advised if he feels worse or is not getting better that he should go to the ER for evaluation over the weekend.  Leeanne Rio, MD

## 2014-10-30 NOTE — Telephone Encounter (Signed)
Called pt and asked if he could read Warfarin bottle instructions but he was unable to because he did not have glasses with him to read it and stated he would call back to tell. Oletta Lamas, CMA.

## 2014-11-03 ENCOUNTER — Encounter: Payer: Self-pay | Admitting: Family Medicine

## 2014-11-03 ENCOUNTER — Ambulatory Visit (INDEPENDENT_AMBULATORY_CARE_PROVIDER_SITE_OTHER): Payer: Medicare Other | Admitting: Family Medicine

## 2014-11-03 ENCOUNTER — Inpatient Hospital Stay (HOSPITAL_COMMUNITY)
Admission: AD | Admit: 2014-11-03 | Discharge: 2014-11-06 | DRG: 554 | Disposition: A | Discharge status: Home / Self Care | Payer: Medicare Other | Source: Ambulatory Visit | Attending: Family Medicine | Admitting: Family Medicine

## 2014-11-03 ENCOUNTER — Encounter (HOSPITAL_COMMUNITY): Payer: Self-pay | Admitting: Surgery

## 2014-11-03 VITALS — BP 104/73 | HR 74 | Temp 98.9°F | Ht 66.0 in

## 2014-11-03 DIAGNOSIS — I482 Chronic atrial fibrillation, unspecified: Secondary | ICD-10-CM

## 2014-11-03 DIAGNOSIS — E785 Hyperlipidemia, unspecified: Secondary | ICD-10-CM | POA: Diagnosis present

## 2014-11-03 DIAGNOSIS — I272 Other secondary pulmonary hypertension: Secondary | ICD-10-CM | POA: Diagnosis present

## 2014-11-03 DIAGNOSIS — K219 Gastro-esophageal reflux disease without esophagitis: Secondary | ICD-10-CM | POA: Diagnosis not present

## 2014-11-03 DIAGNOSIS — I959 Hypotension, unspecified: Secondary | ICD-10-CM | POA: Diagnosis not present

## 2014-11-03 DIAGNOSIS — M1990 Unspecified osteoarthritis, unspecified site: Secondary | ICD-10-CM | POA: Diagnosis not present

## 2014-11-03 DIAGNOSIS — M10061 Idiopathic gout, right knee: Secondary | ICD-10-CM | POA: Diagnosis not present

## 2014-11-03 DIAGNOSIS — Z6824 Body mass index (BMI) 24.0-24.9, adult: Secondary | ICD-10-CM | POA: Diagnosis not present

## 2014-11-03 DIAGNOSIS — M10062 Idiopathic gout, left knee: Secondary | ICD-10-CM | POA: Diagnosis not present

## 2014-11-03 DIAGNOSIS — M25462 Effusion, left knee: Secondary | ICD-10-CM | POA: Diagnosis not present

## 2014-11-03 DIAGNOSIS — E780 Pure hypercholesterolemia: Secondary | ICD-10-CM | POA: Diagnosis present

## 2014-11-03 DIAGNOSIS — R634 Abnormal weight loss: Secondary | ICD-10-CM

## 2014-11-03 DIAGNOSIS — Z79899 Other long term (current) drug therapy: Secondary | ICD-10-CM

## 2014-11-03 DIAGNOSIS — R05 Cough: Secondary | ICD-10-CM

## 2014-11-03 DIAGNOSIS — M25561 Pain in right knee: Secondary | ICD-10-CM

## 2014-11-03 DIAGNOSIS — F209 Schizophrenia, unspecified: Secondary | ICD-10-CM | POA: Diagnosis present

## 2014-11-03 DIAGNOSIS — I10 Essential (primary) hypertension: Secondary | ICD-10-CM | POA: Diagnosis not present

## 2014-11-03 DIAGNOSIS — M25461 Effusion, right knee: Secondary | ICD-10-CM | POA: Diagnosis not present

## 2014-11-03 DIAGNOSIS — R791 Abnormal coagulation profile: Secondary | ICD-10-CM | POA: Diagnosis present

## 2014-11-03 DIAGNOSIS — L89152 Pressure ulcer of sacral region, stage 2: Secondary | ICD-10-CM | POA: Diagnosis present

## 2014-11-03 DIAGNOSIS — Z720 Tobacco use: Secondary | ICD-10-CM

## 2014-11-03 DIAGNOSIS — F329 Major depressive disorder, single episode, unspecified: Secondary | ICD-10-CM | POA: Diagnosis present

## 2014-11-03 DIAGNOSIS — N400 Enlarged prostate without lower urinary tract symptoms: Secondary | ICD-10-CM | POA: Diagnosis present

## 2014-11-03 DIAGNOSIS — E440 Moderate protein-calorie malnutrition: Secondary | ICD-10-CM | POA: Insufficient documentation

## 2014-11-03 DIAGNOSIS — M1712 Unilateral primary osteoarthritis, left knee: Secondary | ICD-10-CM | POA: Diagnosis not present

## 2014-11-03 DIAGNOSIS — F17210 Nicotine dependence, cigarettes, uncomplicated: Secondary | ICD-10-CM | POA: Diagnosis not present

## 2014-11-03 DIAGNOSIS — M25562 Pain in left knee: Secondary | ICD-10-CM | POA: Diagnosis present

## 2014-11-03 DIAGNOSIS — I252 Old myocardial infarction: Secondary | ICD-10-CM

## 2014-11-03 DIAGNOSIS — Z7901 Long term (current) use of anticoagulants: Secondary | ICD-10-CM

## 2014-11-03 DIAGNOSIS — D649 Anemia, unspecified: Secondary | ICD-10-CM | POA: Diagnosis not present

## 2014-11-03 DIAGNOSIS — M130 Polyarthritis, unspecified: Secondary | ICD-10-CM

## 2014-11-03 DIAGNOSIS — M1711 Unilateral primary osteoarthritis, right knee: Secondary | ICD-10-CM | POA: Diagnosis not present

## 2014-11-03 DIAGNOSIS — I2510 Atherosclerotic heart disease of native coronary artery without angina pectoris: Secondary | ICD-10-CM | POA: Diagnosis not present

## 2014-11-03 DIAGNOSIS — R52 Pain, unspecified: Secondary | ICD-10-CM

## 2014-11-03 DIAGNOSIS — M109 Gout, unspecified: Secondary | ICD-10-CM | POA: Diagnosis not present

## 2014-11-03 DIAGNOSIS — R059 Cough, unspecified: Secondary | ICD-10-CM

## 2014-11-03 LAB — URINALYSIS, ROUTINE W REFLEX MICROSCOPIC
Bilirubin Urine: NEGATIVE
GLUCOSE, UA: NEGATIVE mg/dL
Ketones, ur: NEGATIVE mg/dL
LEUKOCYTES UA: NEGATIVE
Nitrite: NEGATIVE
PROTEIN: 30 mg/dL — AB
Specific Gravity, Urine: 1.015 (ref 1.005–1.030)
UROBILINOGEN UA: 1 mg/dL (ref 0.0–1.0)
pH: 5.5 (ref 5.0–8.0)

## 2014-11-03 LAB — COMPREHENSIVE METABOLIC PANEL
ALT: 162 U/L — AB (ref 0–53)
AST: 102 U/L — AB (ref 0–37)
Albumin: 2.1 g/dL — ABNORMAL LOW (ref 3.5–5.2)
Alkaline Phosphatase: 143 U/L — ABNORMAL HIGH (ref 39–117)
Anion gap: 7 (ref 5–15)
BILIRUBIN TOTAL: 0.8 mg/dL (ref 0.3–1.2)
BUN: 21 mg/dL (ref 6–23)
CALCIUM: 8.8 mg/dL (ref 8.4–10.5)
CO2: 23 mmol/L (ref 19–32)
Chloride: 107 mmol/L (ref 96–112)
Creatinine, Ser: 1.34 mg/dL (ref 0.50–1.35)
GFR, EST AFRICAN AMERICAN: 64 mL/min — AB (ref >90)
GFR, EST NON AFRICAN AMERICAN: 56 mL/min — AB (ref >90)
GLUCOSE: 138 mg/dL — AB (ref 70–99)
Potassium: 4 mmol/L (ref 3.5–5.1)
SODIUM: 137 mmol/L (ref 135–145)
Total Protein: 7.2 g/dL (ref 6.0–8.3)

## 2014-11-03 LAB — CBC WITH DIFFERENTIAL/PLATELET
Basophils Absolute: 0 10*3/uL (ref 0.0–0.1)
Basophils Relative: 0 % (ref 0–1)
EOS ABS: 0 10*3/uL (ref 0.0–0.7)
Eosinophils Relative: 0 % (ref 0–5)
HCT: 25.4 % — ABNORMAL LOW (ref 39.0–52.0)
HEMOGLOBIN: 8.7 g/dL — AB (ref 13.0–17.0)
Lymphocytes Relative: 15 % (ref 12–46)
Lymphs Abs: 0.7 10*3/uL (ref 0.7–4.0)
MCH: 29.4 pg (ref 26.0–34.0)
MCHC: 34.3 g/dL (ref 30.0–36.0)
MCV: 85.8 fL (ref 78.0–100.0)
MONO ABS: 0.4 10*3/uL (ref 0.1–1.0)
MONOS PCT: 7 % (ref 3–12)
NEUTROS PCT: 78 % — AB (ref 43–77)
Neutro Abs: 3.9 10*3/uL (ref 1.7–7.7)
Platelets: 211 10*3/uL (ref 150–400)
RBC: 2.96 MIL/uL — AB (ref 4.22–5.81)
RDW: 13.7 % (ref 11.5–15.5)
WBC: 5 10*3/uL (ref 4.0–10.5)

## 2014-11-03 LAB — SEDIMENTATION RATE: Sed Rate: 138 mm/hr — ABNORMAL HIGH (ref 0–16)

## 2014-11-03 LAB — RAPID URINE DRUG SCREEN, HOSP PERFORMED
AMPHETAMINES: NOT DETECTED
BENZODIAZEPINES: NOT DETECTED
Barbiturates: NOT DETECTED
Cocaine: NOT DETECTED
Opiates: NOT DETECTED
Tetrahydrocannabinol: POSITIVE — AB

## 2014-11-03 LAB — PHOSPHORUS: Phosphorus: 3.2 mg/dL (ref 2.3–4.6)

## 2014-11-03 LAB — MAGNESIUM: Magnesium: 1.1 mg/dL — ABNORMAL LOW (ref 1.5–2.5)

## 2014-11-03 LAB — URINE MICROSCOPIC-ADD ON

## 2014-11-03 LAB — PROTIME-INR
INR: 4.96 — ABNORMAL HIGH (ref 0.00–1.49)
Prothrombin Time: 46.5 seconds — ABNORMAL HIGH (ref 11.6–15.2)

## 2014-11-03 LAB — URIC ACID: Uric Acid, Serum: 4.9 mg/dL (ref 4.0–7.8)

## 2014-11-03 LAB — TSH: TSH: 2.436 u[IU]/mL (ref 0.350–4.500)

## 2014-11-03 LAB — POCT INR: INR: 6.4

## 2014-11-03 LAB — CK: Total CK: 243 U/L — ABNORMAL HIGH (ref 7–232)

## 2014-11-03 MED ORDER — SODIUM CHLORIDE 0.9 % IJ SOLN: 3.0000 mL | Freq: Two times a day (BID) | INTRAMUSCULAR | Status: DC

## 2014-11-03 MED ORDER — SODIUM CHLORIDE 0.9 % IV SOLN
250.0000 mL | INTRAVENOUS | Status: DC | PRN
  Administered 2014-11-05: 250 mL via INTRAVENOUS

## 2014-11-03 MED ORDER — SODIUM CHLORIDE 0.9 % IV SOLN
INTRAVENOUS | Status: DC
  Administered 2014-11-03 – 2014-11-05 (×6): via INTRAVENOUS
  Filled 2014-11-03: qty 1000

## 2014-11-03 MED ORDER — MAGNESIUM OXIDE 400 (241.3 MG) MG PO TABS
400.0000 mg | ORAL_TABLET | Freq: Two times a day (BID) | ORAL | Status: DC
  Administered 2014-11-03 – 2014-11-06 (×5): 400 mg via ORAL
  Filled 2014-11-03 (×8): qty 1

## 2014-11-03 MED ORDER — POLYETHYLENE GLYCOL 3350 17 G PO PACK
17.0000 g | PACK | Freq: Every day | ORAL | Status: DC
  Administered 2014-11-03 – 2014-11-06 (×2): 17 g via ORAL
  Filled 2014-11-03 (×4): qty 1

## 2014-11-03 MED ORDER — ONDANSETRON HCL 4 MG/2ML IJ SOLN: 4.0000 mg | Freq: Four times a day (QID) | INTRAMUSCULAR | Status: DC | PRN

## 2014-11-03 MED ORDER — ACETAMINOPHEN 650 MG RE SUPP: 650.0000 mg | Freq: Four times a day (QID) | RECTAL | Status: DC | PRN

## 2014-11-03 MED ORDER — PREDNISONE 50 MG PO TABS
50.0000 mg | ORAL_TABLET | Freq: Every day | ORAL | Status: DC
  Administered 2014-11-03 – 2014-11-06 (×4): 50 mg via ORAL
  Filled 2014-11-03 (×5): qty 1

## 2014-11-03 MED ORDER — SENNOSIDES-DOCUSATE SODIUM 8.6-50 MG PO TABS: 1.0000 | ORAL_TABLET | Freq: Every evening | ORAL | Status: DC | PRN

## 2014-11-03 MED ORDER — SODIUM CHLORIDE 0.9 % IJ SOLN: 3.0000 mL | INTRAMUSCULAR | Status: DC | PRN

## 2014-11-03 MED ORDER — CALCIUM CARBONATE ANTACID 500 MG PO CHEW
1.0000 | CHEWABLE_TABLET | Freq: Every day | ORAL | Status: DC
  Administered 2014-11-03 – 2014-11-06 (×4): 200 mg via ORAL
  Filled 2014-11-03 (×4): qty 1

## 2014-11-03 MED ORDER — HYDROMORPHONE HCL 1 MG/ML IJ SOLN
0.5000 mg | INTRAMUSCULAR | Status: DC | PRN
  Administered 2014-11-03 – 2014-11-04 (×2): 0.5 mg via INTRAVENOUS
  Filled 2014-11-03 (×2): qty 1

## 2014-11-03 MED ORDER — ACETAMINOPHEN 325 MG PO TABS: 650.0000 mg | ORAL_TABLET | Freq: Four times a day (QID) | ORAL | Status: DC | PRN

## 2014-11-03 MED ORDER — PANTOPRAZOLE SODIUM 40 MG PO TBEC
40.0000 mg | DELAYED_RELEASE_TABLET | Freq: Every day | ORAL | Status: DC
  Administered 2014-11-03 – 2014-11-06 (×4): 40 mg via ORAL
  Filled 2014-11-03 (×4): qty 1

## 2014-11-03 MED ORDER — FEBUXOSTAT 40 MG PO TABS
40.0000 mg | ORAL_TABLET | Freq: Every day | ORAL | Status: DC
  Administered 2014-11-04 – 2014-11-06 (×3): 40 mg via ORAL
  Filled 2014-11-03 (×3): qty 1

## 2014-11-03 MED ORDER — ONDANSETRON HCL 4 MG PO TABS: 4.0000 mg | ORAL_TABLET | Freq: Four times a day (QID) | ORAL | Status: DC | PRN

## 2014-11-03 NOTE — H&P (Signed)
[facility] Admission History and Physical Service Pager: [phone]  Patient name: Jesse Franklin Medical record number: [id number] Date of birth: Mar 02, 1951 Age: 64 y.o. Gender: male  Primary Care Provider: Chrisandra Netters, MD Consultants: none Code Status: Full (confirmed on admission)  Chief Complaint: bilateral knee pain and swelling  Assessment and Plan: JODEN BONSALL is a 64 y.o. male presenting directly from [facility] with bilateral knee swelling and pain, also with R-wrist, R-great toe pain suspicious for subacute polyarticular gout flare.  PMH is significant for CAD, AFib (anticoag coumadin), HTN, HLD, gout (L-knee, s/p drain 10/12/14), supra-therapeutic INR 10/2014, hypocalcemia, hypomagnesemia  # Polyarticular joint swelling and pain, consistent with subacute gout flare: Recent intermittent gout flare L-knee since 08/2014 with multiple office visits, recently s/p drain and steroid inj at [facility] on 10/12/14. Seen in office 10/27/14 and thought to have another flare.  Rx'd prednisone (66m x 5 days, without improvement) & dilaudid at that time. Last uric acid 4.3 (10/27/14). Low suspicious for infectious etiology, unlikely septic arthritis (afebrile, multiple joints, WBC 5). - Admit to FPTS under Dr McDiarmid - Telemetry - resume Uloric - Repeat uric acid (may not be acutely elevated in acute flare) - Obtain ESR - IV Dilaudid 0.553mq3 PRN pain (may de-escalate to morphine vs fentanyl if BP drops on dilaudid) - Prednisone 5044md until symptoms resolved - UDS - Activity with assistance - PT/OT (to start in AM if pain improved) - previously declined HH. Re-evaluate may need SNF vs HH [place] Hypomagnesemia: 2/16 was <1.0. - Mg level ordered - Continue home MagOx 400 BID  # Hypocalcemia: - Recently low since 2/12 6-7, unclear if taking calcium PO at home after recent discharge - Last checked 2/16 at 7.2 -Ca level ordered - PO replacement daily   # Elevated CK:  3950 on 2/16 -Repeat CK level -Obtain TSH, Phos -IVFs -hold Crestor for now -check UA  # Stage 2 sacral ulcer - Seems to have developed recently in setting of 3-4 days immobilized at home. No signs of secondary infection. - wound care consult  # Suprathrapeutic INR (Coumadin anticoagulation for AFib); INR  3.4 on 2/16.  INR 6.4 today in office, without history of active bleeding. Followed by Cardiology for INR checks - Repeat PT / INR - Hold Coumadin - Consult pharm for coumadin dosing - Consider vitamin K if needed for reversal. Hold on knee aspiration at this time given elevated INR.  # Atrial Fibrillation, chronic - Repeat EKG - Hold Coumadin - (ordered consult per pharm)  # H/o CAD: Stable no active CP - Continue rosuvastatin 44m36mily  # HTN: BP 112/76 - Hold Amlodipine and Altace for now.   # GERD - Continue PPI  # HLD:  - hold Crestor for now  FEN/GI: NS @ 125cc/hr, heart healthy diet Prophylaxis: anticoagulated with coumadin (currently holding with supratherapeutic INR)  Disposition: Admit to FPTS inpatient, treat acute polyarticular gout flare, supratherapeutic INR without active bleeding. Continue IVF rehydration.Will need PT/OT eval prior to discharge [facility]  History of Present Illness: Jesse Franklin 63 y35. male with b/l LE pain.  Patient [facility] by Dr [facility] today, found to have an INR of:  Patient reports that he is having another gout flare.  Was resolved at discharge last time.  Flared up again about 2 days after last discharge.  Patient states that he picked up medications last time.  Patient states that medications  did not help at all.  8-9/10 at its worst.  Currently 7-8/10.  Patient states that sometimes it comes and goes sometimes it stays there all the time.  Pain is like a sledge hammer, worse at night.  Newest pain pill helps a little but does not last long.  Pain in both knees, R big toe, and some in R hand as well.   States that this is the worst that gout flare has ever been.  States that he cannot walk.  He reports that he was on the floor for about 3-4 days 2/2 pain.  Still takes Uroloric.  Endorses some sweats, cough since sun night.  States that sometimes he feels SOB over the last couple of days.  Denies nausea, vomiting, abdominal pain, weakness.  Also, noticed a sore on his bottom on Saturday.  States that neighbors helped him.  Reports that he lives alone.    Review Of Systems: Per HPI with the following additions: none Otherwise 12 point review of systems was performed and was unremarkable.  Patient Active Problem List   Diagnosis Date Noted  . Hypomagnesemia   . Dehydration   . Hypocalcemia   . AKI (acute kidney injury) 10/23/2014  . Abdominal pain, lower   . Supratherapeutic INR   . Gout flare 10/12/2014  . Baker's cyst of knee 10/06/2014  . Diarrhea   . Anginal chest pain at rest 07/31/2014  . Metabolic acidosis 59/56/3875  . Chest pain   . Nausea with vomiting   . Pulmonary hypertension 12/02/2013  . Encounter for therapeutic drug monitoring 11/20/2013  . Hx of long term use of blood thinners 01/11/2013  . Influenza vaccination declined 05/24/2012  . Olecranon bursitis of left elbow 03/18/2012  . Erectile dysfunction 08/25/2011  . Knee pain, bilateral 05/04/2011  . Herpes genitalia 04/13/2011  . Unspecified deficiency anemia 05/31/2010  . Gout 04/14/2009  . HYPERTENSION, BENIGN ESSENTIAL 12/11/2006  . HYPERCHOLESTEROLEMIA 11/08/2006  . SCHIZOPHRENIA 11/08/2006  . DEPRESSION, MAJOR, RECURRENT 11/08/2006  . TOBACCO DEPENDENCE 11/08/2006  . CORONARY, ARTERIOSCLEROSIS 11/08/2006  . Atrial fibrillation 11/08/2006  . GASTROESOPHAGEAL REFLUX, NO ESOPHAGITIS 11/08/2006  . BPH 11/08/2006   Past Medical History: Past Medical History  Diagnosis Date  . Anemia   . Hypertension   . Gout   . Atrial fibrillation   . GERD (gastroesophageal reflux disease)   . Hyperlipidemia   .  Herpes   . RHINITIS, ALLERGIC 11/08/2006  . Herpes genitalia 04/13/2011  . CAD (coronary artery disease)     Branch Vessel  . Tobacco abuse   . Alcohol abuse   . Cocaine abuse     Hx of  . BPH 11/08/2006  . Myocardial infarction 2005   Past Surgical History: Past Surgical History  Procedure Laterality Date  . Laceration repair Right 03/1988    "hand"  . Cardiac catheterization  08/2004   Social History: History  Substance Use Topics  . Smoking status: Current Every Day Smoker -- 0.10 packs/day for 48 years    Types: Cigarettes  . Smokeless tobacco: Former Systems developer    Types: Chew    Quit date: 09/12/1967     Comment: Chewed Red Man Tobacco  . Alcohol Use: No     Comment: Pt states that he quit since last gout flare up   Additional social history: Admits history prior drug use > 10 years (none currently. Denies alcohol or smoking.  Please also refer to relevant sections of EMR.  Family History: Family History  Problem Relation Age of Onset  . Alcohol abuse Father   . Heart disease Brother   . Alcohol abuse Brother   . Cancer Sister   . Hypertension Mother   . Gout Mother    Allergies and Medications: Allergies  Allergen Reactions  . Codeine Hives  . Ketorolac Tromethamine     REACTION: hives   Current Facility-Administered Medications on File Prior to Encounter  Medication Dose Route Frequency Provider Last Rate Last Dose  . 0.9 %  sodium chloride infusion   Intravenous Continuous Willeen Niece, MD 125 mL/hr at 07/31/14 1200     Current Outpatient Prescriptions on File Prior to Encounter  Medication Sig Dispense Refill  . amLODipine (NORVASC) 10 MG tablet TAKE 1 TABLET (10 MG TOTAL) BY MOUTH DAILY. 90 tablet 3  . benzonatate (TESSALON) 100 MG capsule Take 1 capsule (100 mg total) by mouth 2 (two) times daily. (Patient not taking: Reported on 10/23/2014) 20 capsule 0  . calcium carbonate (TUMS) 500 MG chewable tablet Chew 1 tablet (200 mg of elemental calcium total) by  mouth daily. 30 tablet 0  . CRESTOR 40 MG tablet TAKE 1 TABLET BY MOUTH EVERY DAY 90 tablet 3  . febuxostat (ULORIC) 40 MG tablet Take 1 tablet (40 mg total) by mouth daily. 30 tablet 2  . guaiFENesin (MUCINEX) 600 MG 12 hr tablet Take 1 tablet (600 mg total) by mouth 2 (two) times daily. (Patient not taking: Reported on 10/23/2014) 30 tablet 0  . HYDROmorphone (DILAUDID) 2 MG tablet Take 0.5 tablets (1 mg total) by mouth every 12 (twelve) hours as needed for severe pain. 6 tablet 0  . magnesium oxide (MAG-OX) 400 (241.3 MG) MG tablet Take 1 tablet (400 mg total) by mouth 2 (two) times daily. 60 tablet 0  . NEXIUM 40 MG capsule TAKE ONE CAPSULE BY MOUTH EVERY DAY 90 capsule 0  . nitroGLYCERIN (NITROSTAT) 0.4 MG SL tablet Place 1 tablet (0.4 mg total) under the tongue as directed. Place 1 tab under tongue as directed 25 tablet 6  . potassium chloride SA (K-DUR,KLOR-CON) 20 MEQ tablet Take 1 tablet (20 mEq total) by mouth daily. 30 tablet 3  . predniSONE (DELTASONE) 20 MG tablet Take 1 tablet (20 mg total) by mouth daily. 5 tablet 0  . ramipril (ALTACE) 10 MG capsule TAKE ONE CAPSULE BY MOUTH TWICE A DAY 180 capsule 3  . traMADol (ULTRAM) 50 MG tablet Take 1 tablet (50 mg total) by mouth every 8 (eight) hours as needed. 30 tablet 0  . warfarin (COUMADIN) 4 MG tablet Take 1 tablet (4 mg total) by mouth one time only at 6 PM. Take 1 and 1/2 tablets on Wednesday then take 1 tablet all the other days 30 tablet 0    Objective: BP 112/76 mmHg  Pulse 78  Temp(Src) 97.6 F (36.4 C) (Axillary)  Resp 19  SpO2 99% Exam: General: thin appearing elderly gentleman, appears comfortable, cooperative, NAD HEENT: NCAT, PERRL, EOMI, o/p clear, poor dentition Cardiovascular: irregularly irregular, no murmurs Respiratory: CTAB. No wheezing, crackles. Non-labored. Good air movement. Speaks full sentences Abdomen: soft, NT/ND, +BS Extremities: warm, notable effusions at b/l knees with warmth but no erythema and  right wrist with inflamed/enlarged ulnar styloid process +TTP, R-great toe MTP with enlarged nodule with erythema and +TTP MSK: ROM limited by pain Skin: warm, dry; skin abrasion on R posterior thigh & R elbow. B/l stage 2 on sacral area without erythema, drainage, or bleeding. Neuro: awake, alert, oriented  x3, grossly non-focal  Labs and Imaging: CBC BMET  No results for input(s): WBC, HGB, HCT, PLT in the last 168 hours. No results for input(s): NA, K, CL, CO2, BUN, CREATININE, GLUCOSE, CALCIUM in the last 168 hours.    Janora Norlander, DO 11/03/2014, 3:33 PM PGY-1, [place] Intern pager: [phone], text pages welcome  Upper Level Addendum:  I have seen and evaluated this patient along with Dr. Lajuana Ripple and reviewed the above note, making necessary revisions in purple.

## 2014-11-03 NOTE — Consult Note (Signed)
PHARMACY CONSULT NOTE  Pharmacy Consult :  Coumadin Indication : atrial fibrillation   Allergies: Allergies  Allergen Reactions  . Codeine Hives  . Ketorolac Tromethamine     REACTION: hives   Dosing weight : 70.8 kg  Vital Signs: BP 112/76 mmHg  Pulse 78  Temp(Src) 97.6 F (36.4 C) (Axillary)  Resp 19  SpO2 99%  Active Problems: Active Problems:   Bilateral knee pain  Labs:  No results for input(s): HGB, HCT, PLT, APTT, LABPROT, INR, HEPARINUNFRC, CREATININE in the last 72 hours. Lab Results  Component Value Date   INR 3.4 10/27/2014   INR 1.63* 10/25/2014   INR 1.67* 10/24/2014   INR Reported from Clinic today, 6.4.  Estimated Creatinine Clearance: 61.4 mL/min (by C-G formula based on Cr of 1.14).  Medical / Surgical History: Past Medical History  Diagnosis Date  . Anemia   . Hypertension   . Gout   . Atrial fibrillation   . GERD (gastroesophageal reflux disease)   . Hyperlipidemia   . Herpes   . RHINITIS, ALLERGIC 11/08/2006  . Herpes genitalia 04/13/2011  . CAD (coronary artery disease)     Branch Vessel  . Tobacco abuse   . Alcohol abuse   . Cocaine abuse     Hx of  . BPH 11/08/2006  . Myocardial infarction 2005   Past Surgical History  Procedure Laterality Date  . Laceration repair Right 03/1988    "hand"  . Cardiac catheterization  08/2004    Current Medication[s] Include: Medication PTA: Medication Sig  . amLODipine (NORVASC) 10 MG tablet TAKE 1 TABLET (10 MG TOTAL) BY MOUTH DAILY.  . benzonatate (TESSALON) 100 MG capsule Take 1 capsule (100 mg total) by mouth 2 (two) times daily. (Patient not taking: Reported on 10/23/2014)  . calcium carbonate (TUMS) 500 MG chewable tablet Chew 1 tablet (200 mg of elemental calcium total) by mouth daily.  . CRESTOR 40 MG tablet TAKE 1 TABLET BY MOUTH EVERY DAY  . febuxostat (ULORIC) 40 MG tablet Take 1 tablet (40 mg total) by mouth daily.  Marland Kitchen guaiFENesin (MUCINEX) 600 MG 12 hr tablet Take 1 tablet (600 mg  total) by mouth 2 (two) times daily. (Patient not taking: Reported on 10/23/2014)  . HYDROmorphone (DILAUDID) 2 MG tablet Take 0.5 tablets (1 mg total) by mouth every 12 (twelve) hours as needed for severe pain.  . magnesium oxide (MAG-OX) 400 (241.3 MG) MG tablet Take 1 tablet (400 mg total) by mouth 2 (two) times daily.  Marland Kitchen NEXIUM 40 MG capsule TAKE ONE CAPSULE BY MOUTH EVERY DAY  . nitroGLYCERIN (NITROSTAT) 0.4 MG SL tablet Place 1 tablet (0.4 mg total) under the tongue as directed. Place 1 tab under tongue as directed  . potassium chloride SA (K-DUR,KLOR-CON) 20 MEQ tablet Take 1 tablet (20 mEq total) by mouth daily.  . predniSONE (DELTASONE) 20 MG tablet Take 1 tablet (20 mg total) by mouth daily.  . ramipril (ALTACE) 10 MG capsule TAKE ONE CAPSULE BY MOUTH TWICE A DAY  . traMADol (ULTRAM) 50 MG tablet Take 1 tablet (50 mg total) by mouth every 8 (eight) hours as needed.  . warfarin (COUMADIN) 4 MG tablet Take 1 tablet (4 mg total) by mouth one time only at 6 PM. Take 1 and 1/2 tablets on Wednesday then take 1 tablet all the other days   Warfarin 5 mg tablet Patient reports taking 31m daily except 7.5 mg on Wednesday.    Scheduled:  Scheduled:  . magnesium  oxide  400 mg Oral BID  . predniSONE  50 mg Oral Q breakfast    Assessment: - 64 y.o.male with PMH Anemia, HTN, Afib, GERD, HLD, Herpes, CAD, MI, Tobacco, ETOH, Cocaine admitted with bilateral knee pain.  Pharmacy has been asked to manage his Coumadin for atrial fibrillation.  - Home dose of Coumadin is 5 mg daily except 7.5 mg on Wednesday.  Old dose was 4 mg daily except 6 mg on Wednesdays.  Patient reports he is taking both doses, 9 mg daily except 13.5 mg on Wed. - INR in clinic 6.04.  Family practice to review with patient home medication.  Goal of Therapy:  INR goal is 2-3    Plan:  - No Coumadin today. - Daily INR's, Platelet count, CBC.  Monitor for bleeding complications.   Nayeliz Hipp, Craig Guess,  Pharm.D.. 11/03/2014,  4:05  PM

## 2014-11-04 ENCOUNTER — Inpatient Hospital Stay (HOSPITAL_COMMUNITY): Payer: Medicare Other

## 2014-11-04 ENCOUNTER — Ambulatory Visit: Payer: Medicare Other | Admitting: Family Medicine

## 2014-11-04 DIAGNOSIS — M130 Polyarthritis, unspecified: Secondary | ICD-10-CM

## 2014-11-04 LAB — MAGNESIUM: MAGNESIUM: 1.1 mg/dL — AB (ref 1.5–2.5)

## 2014-11-04 LAB — BASIC METABOLIC PANEL
Anion gap: 6 (ref 5–15)
BUN: 25 mg/dL — AB (ref 6–23)
CO2: 23 mmol/L (ref 19–32)
Calcium: 9 mg/dL (ref 8.4–10.5)
Chloride: 108 mmol/L (ref 96–112)
Creatinine, Ser: 1.13 mg/dL (ref 0.50–1.35)
GFR, EST AFRICAN AMERICAN: 79 mL/min — AB (ref >90)
GFR, EST NON AFRICAN AMERICAN: 68 mL/min — AB (ref >90)
Glucose, Bld: 143 mg/dL — ABNORMAL HIGH (ref 70–99)
POTASSIUM: 3.9 mmol/L (ref 3.5–5.1)
Sodium: 137 mmol/L (ref 135–145)

## 2014-11-04 LAB — PROTIME-INR
INR: 5.2 (ref 0.00–1.49)
PROTHROMBIN TIME: 49.3 s — AB (ref 11.6–15.2)

## 2014-11-04 LAB — HEPATITIS B SURFACE ANTIGEN: Hepatitis B Surface Ag: NEGATIVE

## 2014-11-04 LAB — CBC
HCT: 24.9 % — ABNORMAL LOW (ref 39.0–52.0)
Hemoglobin: 8.8 g/dL — ABNORMAL LOW (ref 13.0–17.0)
MCH: 30.2 pg (ref 26.0–34.0)
MCHC: 35.3 g/dL (ref 30.0–36.0)
MCV: 85.6 fL (ref 78.0–100.0)
Platelets: 230 10*3/uL (ref 150–400)
RBC: 2.91 MIL/uL — ABNORMAL LOW (ref 4.22–5.81)
RDW: 13.7 % (ref 11.5–15.5)
WBC: 5.7 10*3/uL (ref 4.0–10.5)

## 2014-11-04 LAB — HEPATITIS C ANTIBODY: HCV AB: NEGATIVE

## 2014-11-04 LAB — OCCULT BLOOD X 1 CARD TO LAB, STOOL: Fecal Occult Bld: NEGATIVE

## 2014-11-04 MED ORDER — MORPHINE SULFATE 2 MG/ML IJ SOLN
2.0000 mg | INTRAMUSCULAR | Status: DC | PRN
  Administered 2014-11-04 – 2014-11-05 (×2): 2 mg via INTRAVENOUS
  Filled 2014-11-04 (×3): qty 1

## 2014-11-04 MED ORDER — WARFARIN - PHARMACIST DOSING INPATIENT
Freq: Every day | Status: DC
  Administered 2014-11-05: 1

## 2014-11-04 MED ORDER — OXYCODONE-ACETAMINOPHEN 5-325 MG PO TABS: 1.0000 | ORAL_TABLET | ORAL | Status: DC

## 2014-11-04 MED ORDER — LIDOCAINE HCL (PF) 1 % IJ SOLN
10.0000 mL | Freq: Once | INTRAMUSCULAR | Status: AC
  Administered 2014-11-05: 10 mL
  Filled 2014-11-04: qty 10

## 2014-11-04 MED ORDER — METHYLPREDNISOLONE ACETATE 40 MG/ML IJ SUSP
80.0000 mg | Freq: Once | INTRAMUSCULAR | Status: AC
Start: 2014-11-04 — End: 2014-11-05
  Administered 2014-11-05: 80 mg via INTRA_ARTICULAR
  Filled 2014-11-04: qty 2

## 2014-11-04 MED ORDER — LIDOCAINE HCL (PF) 1 % IJ SOLN: 5.0000 mL | Freq: Once | INTRAMUSCULAR | Status: DC

## 2014-11-04 NOTE — Progress Notes (Signed)
Family Medicine Teaching Service Daily Progress Note Intern Pager: [phone]  Patient name: Jesse Franklin Medical record number: [id number] Date of birth: [birth year] Age: 64 y.o. Gender: male  Primary Care Provider: Chrisandra Netters, MD Consultants: none Code Status: Full (confirmed on admission)  Pt Overview and Major Events to Date:  2/23: Admitted from [facility] clinic with bilateral knee pain and swelling  Assessment and Plan: Jesse Franklin is a 64 y.o. male presenting directly from [facility] with bilateral knee swelling and pain, also with R-wrist, R-great toe pain suspicious for subacute polyarticular gout flare. PMH is significant for CAD, AFib (anticoag coumadin), HTN, HLD, gout (L-knee, s/p drain 10/12/14), supra-therapeutic INR 10/2014, hypocalcemia, hypomagnesemia  # Polyarticular joint swelling and pain, likely subacute gout flare: Recent intermittent gout flare L-knee since 08/2014 with multiple office visits, recently s/p drain and steroid inj at [facility] on 10/12/14. Seen in office 10/27/14 and thought to have another flare. Rx'd prednisone (23m x 5 days without improvement) & dilaudid at that time. Uric 4.9 (may not be elevated in acute flare), sed rate 138. Low suspision for infectious etiology, unlikely septic arthritis (afebrile, multiple joints, WBC 5) - Telemetry - No joint imaging since 2012 - repeat bilateral knee imaging - Resume Uloric. Will consider switching this medication as patient is having more flairs, AST and ALT elevation and supratherapeutic INR since initiation.  - IV Dilaudid 0.531mq3 PRN pain - Prednisone 5020md until symptoms resolved (2/23>>) - Activity with assistance - PT/OT to start when pain improves. Previously declined HH,[place] need to re-evaluate for SNF vs HH  # 17 lbs weight loss in the setting of elevated sed rate and possible bone pain: Patient is at risk of cancer given his age and smoking history. Pt had normal colonoscopy in 11/2013. - Screening CXR - PSA  (0.98 in 05/2010) - Will also screen for Hep B, Hep C given EPSS recommendations  # Normocytic Anemia: Pt with hgb 8.8 this admission. B/l 12-13. FOBT negative. Previous iron studies in 2011 consistent with IDA. Normal colonoscopy in 11/2013 - Will not repeat iron panel now as ferritin will likely be elevated in acute illness - Retic count  - FOBT  # Hypomagnesemia: <1.0 on 2/16, Since admission 1.1>1.1 - Continue home MagOx 400 BID  # Hypocalcemia: Recently low since 2/12 6-7, unclear if taking calcium PO at home after recent discharge. WNL this admission 8.8>9.0 - PO replacement daily  # Elevated CK: 3950 on 2/16, CK 243 this admission. TSH and Phos WNL -IVFs @ 125 -hold Crestor for now - UA with trace hgb, protein 30, few squamous epi, few bacteria, +granular casts  # Stage 2 sacral ulcer: Seems to have developed recently when patient was immobilized at home for 3 days. No sign of secondary infection.  - Wound care consulted who recommend sacral foam dressing to area to prevent further breakdown to be changed every 5 days.  # Suprathrapeutic INR (Coumadin anticoagulation for AFib): Patient seems to have theraputic INR historically. INR supratherapeutic in Nov. 2015 and again recently. Patient may be doubling ujup on his medications. No history of active bleeding. Followed by Cardiology for INR checks - INR 6.4>4.96>5.20 - Hold Coumadin - Consult pharm for coumadin dosing - Consider vitamin K if needed for reversal. Hold on knee aspiration at this time.   # Atrial Fibrillation, chronic - Repeat EKG - Hold Coumadin - (ordered consult per pharm)  # H/o CAD: Stable no active CP - Continue rosuvastatin 64m37mily  #  HTN: BPs low-normal - Hold Amlodipine and Altace for now.   # GERD - Continue PPI  # HLD:  - hold Crestor for now  # Malnutrition: Patient appears malnourished. Albumin 2.1 (3.5 in clinic on 2/16) in the setting of recent immbolizationat home.  - Consult  nutrition  FEN/GI: NS @ 125cc/hr, heart healthy diet Prophylaxis: anticoagulated with coumadin (currently holding with supratherapeutic INR)  Disposition: Admit to FPTS inpatient, treat acute poly articular gout flare, supratherapeutic INR without active bleeding. Continue IVF rehydration.Will need PT/OT eval prior to discharge determine if [facility] vs SNF  Subjective:  Patient reports his knee pain is slightly improved from yesterday. He states the sledghammer pain is still a 7-8/10, and he would like it to be around a 3. He states this is the worst gout flare he has ever had and that he has never had bilateral knee pain simultaneously. He states he used to drink and thought that was a trigger, but has not had any alcohol in about a month and a half. He states he rarely eats steak and occasionally eats a hamburger and cannot think of any other gout triggers. He reports his mom had debilitating gout in bilateral knees. She was unable to ambulate and required her "knee caps to be removed". He denies a family history of rheumatologic disorders.   He confirms that his knees, wrist and toe were painless at recent hospital discharge (2/14) and that the flare started very acutely. He states that he was in a lot of pain and was walking from the bathroom to the bedroom and "missed his bed" and landed on the floor. He sat on his back on the floor for 3 days before he was found.   He smokes 4-5 cigarettes a day and has been smoking for 48 years. Denies any history of lung disease.   He is frustrated with physicians because he "has too many medicine" and his number of medications has doubled recently. I believe he may be doubling up on certain medications or taking medications that were meant to be discontinued.   Objective: Temp:  [97.6 F (36.4 C)-98.9 F (37.2 C)] 97.9 F (36.6 C) (02/24 0652) Pulse Rate:  [74-90] 90 (02/24 0652) Resp:  [16-19] 18 (02/24 0652) BP: (94-112)/(57-76) 111/70 mmHg (02/24  0652) SpO2:  [98 %-100 %] 100 % (02/24 0652) Weight:  [70 kg (154 lb 5.2 oz)] 70 kg (154 lb 5.2 oz) (02/23 1600) Physical Exam: General: thin appearing elderly gentleman, appears comfortable, cooperative, NAD HEENT: NCAT, o/p clear, poor dentition Cardiovascular: irregularly irregular, normal S1, S2, no murmurs Respiratory: CTAB. No wheezing, crackles. Non-labored. Good air movement. Speaks full sentences Abdomen: soft, NT/ND, Extremities: warm, notable effusions at b/l knees with warmth but no erythema L worse than R. Pain with palpation over left patella. Right wrist with inflamed/enlarged ulnar styloid process +TTP, R great toe MTP with enlarged tender nodule with erythema. Tophus evident over left elbow. Prominent nail clubbing of all 10 digits MSK: ROM limited by pain Skin: warm, dry; skin abrasion on R posterior thigh & R elbow. B/l stage 2 on sacral area was not examined today Neuro: awake, alert, oriented x3, grossly non-focal  Laboratory:  Recent Labs Lab 11/03/14 1720 11/04/14 0623  WBC 5.0 5.7  HGB 8.7* 8.8*  HCT 25.4* 24.9*  PLT 211 230    Recent Labs Lab 11/03/14 1720 11/04/14 0623  NA 137 137  K 4.0 3.9  CL 107 108  CO2 23 23  BUN 21  25*  CREATININE 1.34 1.13  CALCIUM 8.8 9.0  PROT 7.2  --   BILITOT 0.8  --   ALKPHOS 143*  --   ALT 162*  --   AST 102*  --   GLUCOSE 138* 143*   Drugs of Abuse     Component Value Date/Time   LABOPIA NONE DETECTED 11/03/2014 1932   COCAINSCRNUR NONE DETECTED 11/03/2014 1932   LABBENZ NONE DETECTED 11/03/2014 1932   AMPHETMU NONE DETECTED 11/03/2014 1932   THCU POSITIVE* 11/03/2014 1932   LABBARB NONE DETECTED 11/03/2014 1932    Urinalysis    Component Value Date/Time   COLORURINE AMBER* 11/03/2014 1932   APPEARANCEUR CLEAR 11/03/2014 1932   LABSPEC 1.015 11/03/2014 1932   PHURINE 5.5 11/03/2014 1932   GLUCOSEU NEGATIVE 11/03/2014 1932   HGBUR TRACE* 11/03/2014 1932   HGBUR trace-intact 02/19/2007 [place] 11/03/2014 1932   KETONESUR NEGATIVE 11/03/2014 1932   PROTEINUR 30* 11/03/2014 1932   UROBILINOGEN 1.0 11/03/2014 1932   NITRITE NEGATIVE 11/03/2014 1932   LEUKOCYTESUR NEGATIVE 11/03/2014 1932    CK total - 243  Sed rate - 138  TSH - 2.436  INR - 6.4 > 4.96 > 5.20 PT - 46.5 > 49.3  Imaging/Diagnostic Tests: 8/29/21012 DG Right Knee: Findings: There are mild degenerative changes but no acute bony findings or osteochondral lesions. A moderate sized joint effusion is noted. IMPRESSION: Mild degenerative changes and moderate sized joint effusion.  05/10/2011 DG Left Knee: Findings: There are mild tricompartmental degenerative changes. No acute bony findings or osteochondral lesions. A moderate sized joint effusion is noted. IMPRESSION: Degenerative changes and joint effusion.   Tomasa Rand Dinizo, Med Student 11/04/2014, 9:01 AM [place] Intern pager: [phone], text pages welcome   Resident Note: Briefly, Pt was admitted for a presumed gout flare in multiple joints. He was found down, after being on the floor for 3 days.  Pt had a gout flare with aspiration/gout proven effusion 1.5 months ago with steroid injection. Pt states this is the worse gout flare he has every had and it is in his wrist as well, which he has never had prior. He has had an extended course of steroids this month for gout.Today he states the worse of his pain is in his knee (left>right). He also had left elbow tophi and right great toe pain. Pt has been on Uloric over allopurinol secondary to coumadin interaction with allopurinol. Secondary to be down for 3 days he has also suffered a stage 1 sacral Pressure ulcer. His INR is supratheraputic as well on this admisision. In speaking with the patient it is likely he was accidentally doubling up the dosing after a recent medication alteration. Of note, patient endorses a 10 pound unintentional weight loss after an  acute illness last month. His last PSA was 2011 and normal. He denies any problems with urination/stream. He had a colonoscopy in 2015 that was normal. He has a family history of osteoarthritis, his mother had bilateral knee arthritis and needed to have surgery.   Assessment and Plan:  # Polyarticular joint swelling and pain, likely subacute gout flare: Recently s/p drain and steroid inj at [facility] on 10/12/14. Seen in office 10/27/14 and thought to have another flare. Rx'd prednisone (68m x 5 days without improvement) & PO dilaudid at that time. -  Uric 4.9, sed rate 138. Low suspision for infectious etiology, unlikely septic arthritis (afebrile, multiple joints, WBC 5) - No joint imaging since  2012 - repeat bilateral knee imaging, He will need to have standing films and sunrise views as well. Will wait until after aspiration and joint injection to obtain films (which will be after INR <3). - IV Dilaudid 0.33m q3 PRN pain, pt does not seem to be asking for medications. May need to schedule low dose PO narcotic.  - Prednisone 577mqd until symptoms resolved (2/23>>) - Activity with assistance - [facility] the [facility] and does not seem to completely understand medications. Snf may be more ideal.   # 17 lbs weight loss in the setting of elevated sed rate and possible bone pain: Patient is at risk of cancer given his age and smoking history. Pt had normal colonoscopy in 11/2013. - Screening CXR - PSA (0.98 in 05/2010) - Will also screen for Hep B, Hep C given EPSS recommendations  # Normocytic Anemia: Pt with hgb 8.8 this admission. B/l 12-13. FOBT negative.  Normal colonoscopy in 11/2013 - Will not repeat iron panel now as ferritin will likely be elevated in acute illness - Retic count to help differentiate cause of anemia.   # Hypomagnesemia: <1.0 on 2/16, Since admission 1.1>1.1 - Continue home MagOx 400 BID  # Hypocalcemia: Recently low since 2/12 6-7,  unclear if taking calcium PO at home after recent discharge. WNL this admission 8.8>9.0 - PO replacement daily  # Elevated CK: 3950 on 2/16, CK 243 this admission. TSH and Phos WNL - IVFs @ 125 - hold liver toxic agents if possible. hold Crestor for now - UA with trace hgb, protein 30, few squamous epi, few bacteria, +granular casts  # Stage 2 sacral ulcer: Seems to have developed recently when patient was immobilized at home for 3 days. No sign of secondary infection. Wound care consulted who recommend sacral foam dressing to area to prevent further breakdown to be changed every 5 days.  # Suprathrapeutic INR (Coumadin anticoagulation for AFib): Patient seems to have theraputic INR historically. INR supratherapeutic in Nov. 2015 and again recently. Patient may be doubling ujup on his medications. No history of active bleeding. Followed by Cardiology for INR checks - INR 6.4>4.96>5.20; Hold Coumadin - Consult pharm for coumadin dosing  # Atrial Fibrillation, chronic >> Hold Coumadin, INR ~5 - (ordered consult per pharm)  # H/o CAD: Stable no active CP >> holding statin secondary to elevated LFT  # HTN: BPs low-normal >> Hold Amlodipine and Altace for now.   # Malnutrition: Patient appears malnourished. Albumin 2.1 (3.5 in clinic on 2/16) in the setting of recent immbolizationat home.  - Consult nutrition

## 2014-11-04 NOTE — Progress Notes (Signed)
PT Cancellation Note  Patient Details Name: Jesse Franklin MRN: [id number] DOB: Jul 05, 1951   Cancelled Treatment:     PT order received. Pt is unable to participate in therapy today due to INR level of 5.2. Will monitor pt's status and evaluate pt when medically stable.   Lelon Mast 11/04/2014, 10:56 AM

## 2014-11-04 NOTE — Consult Note (Addendum)
WOC wound consult note Reason for Consult: Evaluation of Stage Two Pressure Ulcers to buttocks.  Pt states that he fell at home prior to admission and was down on the floor on his back for several days before someone found him. Wound type: Pressure Ulcer  Pressure Ulcer POA: YES, Pressure Ulcers present on admission Measurement: Right buttock 5X4X0.2cm, 100% red, dry, no odor or drainage noted; left buttock 4X3X0.2 cm, 100% red, dry, no odor or drainage noted. Periwound:Skin intact Dressing procedure/placement/frequency: Recommend sacral foam dressing to area to prevent further breakdown.  Change every five days or prn for soiling. Pt educated on need for frequent repositioning as well as the treatment plan, to which he verbalized understanding.  Orson Gear, BSN, RN-BC  Graduate Student Please re-consult if further assistance is needed.  Thank-you,  Julien Girt MSN, [place], [place], [place], [place]

## 2014-11-04 NOTE — Progress Notes (Signed)
Utilization review completed. Ruthanna Macchia, RN, BSN. 

## 2014-11-04 NOTE — Progress Notes (Signed)
ANTICOAGULATION CONSULT NOTE - Follow Up Consult  Pharmacy Consult:  Coumadin Indication: atrial fibrillation  Allergies  Allergen Reactions  . Codeine Hives  . Ketorolac Tromethamine     REACTION: hives    Patient Measurements: Height: 5' 6"  (167.6 cm) Weight: 154 lb 5.2 oz (70 kg) IBW/kg (Calculated) : 63.8  Vital Signs: Temp: 97.9 F (36.6 C) (02/24 0652) Temp Source: Oral (02/24 0652) BP: 111/70 mmHg (02/24 0652) Pulse Rate: 90 (02/24 0652)  Labs:  Recent Labs  11/03/14 1454 11/03/14 1720 11/04/14 0623  HGB  --  8.7* 8.8*  HCT  --  25.4* 24.9*  PLT  --  211 230  LABPROT  --  46.5* 49.3*  INR 6.4 4.96* 5.20*  CREATININE  --  1.34 1.13  CKTOTAL  --  243*  --     Estimated Creatinine Clearance: 61.9 mL/min (by C-G formula based on Cr of 1.13).      Assessment: 89 YOM with history of Afib presented with nausea, vomiting, diarrhea and abdominal pain.  He was on Coumadin PTA and there was confusion regarding his regimen and thus, he was taking more than the intended dose.  INR continues to trend up, currently at 5.2.  No bleeding documented.   Goal of Therapy:  INR 2-3    Plan:  - Continue to hold Coumadin - Daily PT / INR - Watch for s/sx of bleeding and the need for reversal agent - F/U resume home meds - FYI:  Uloric may increase LFTs (up to 5-7% was reported)    Arcelia Pals D. Mina Marble, PharmD, BCPS Pager:  [phone] 11/04/2014, 1:00 PM

## 2014-11-04 NOTE — Clinical Documentation Improvement (Signed)
Please identify any clinical conditions associated with the below labs (if any) and document in your progress note and carry over to the discharge summary.    Component      RBC Hemoglobin HCT  Latest Ref Rng      4.22 - 5.81 MIL/uL 13.0 - 17.0 g/dL 39.0 - 52.0 %  11/03/2014     5:20 PM 2.96 (L) 8.7 (L) 25.4 (L)  11/04/2014      2.91 (L) 8.8 (L) 24.9 (L)   Possible Clinical Conditions: -Anemia / if present please document acuity and type:  Acuity: chronic, acute, acute on chronic  Type: in chronic disease, hemolytic, aplastic, due to blood loss, other (please specify) -Other condition (please specify) -Unable to determine  Thank you, Mateo Flow, RN [phone] Clinical Documentation Specialist

## 2014-11-04 NOTE — Progress Notes (Signed)
OT Cancellation Note  Patient Details Name: Jesse Franklin MRN: [id number] DOB: [birth year]   Cancelled Treatment:    Reason Eval/Treat Not Completed: Medical issues which prohibited therapy. Pt with INR of 5.2 today. Per RN, awaiting to speak with MD. Acute OT will hold at this time until pt medically ready for eval. OT will follow up as available.   [place] Herb M   Cyndie Chime, OTR/L Occupational Therapist [phone] (pager)  11/04/2014, 10:07 AM

## 2014-11-05 ENCOUNTER — Inpatient Hospital Stay (HOSPITAL_COMMUNITY): Payer: Medicare Other

## 2014-11-05 DIAGNOSIS — E440 Moderate protein-calorie malnutrition: Secondary | ICD-10-CM | POA: Insufficient documentation

## 2014-11-05 DIAGNOSIS — M10062 Idiopathic gout, left knee: Secondary | ICD-10-CM

## 2014-11-05 DIAGNOSIS — M109 Gout, unspecified: Secondary | ICD-10-CM | POA: Diagnosis present

## 2014-11-05 DIAGNOSIS — M10061 Idiopathic gout, right knee: Secondary | ICD-10-CM

## 2014-11-05 LAB — SYNOVIAL CELL COUNT + DIFF, W/ CRYSTALS
EOSINOPHILS-SYNOVIAL: 0 % (ref 0–1)
Eosinophils-Synovial: 0 % (ref 0–1)
LYMPHOCYTES-SYNOVIAL FLD: 11 % (ref 0–20)
LYMPHOCYTES-SYNOVIAL FLD: 2 % (ref 0–20)
Monocyte-Macrophage-Synovial Fluid: 26 % — ABNORMAL LOW (ref 50–90)
Monocyte-Macrophage-Synovial Fluid: 28 % — ABNORMAL LOW (ref 50–90)
NEUTROPHIL, SYNOVIAL: 61 % — AB (ref 0–25)
Neutrophil, Synovial: 72 % — ABNORMAL HIGH (ref 0–25)
WBC, Synovial: 5950 /mm3 — ABNORMAL HIGH (ref 0–200)
WBC, Synovial: 8600 /mm3 — ABNORMAL HIGH (ref 0–200)

## 2014-11-05 LAB — CBC
HEMATOCRIT: 24.2 % — AB (ref 39.0–52.0)
Hemoglobin: 8.2 g/dL — ABNORMAL LOW (ref 13.0–17.0)
MCH: 30 pg (ref 26.0–34.0)
MCHC: 33.9 g/dL (ref 30.0–36.0)
MCV: 88.6 fL (ref 78.0–100.0)
Platelets: 242 10*3/uL (ref 150–400)
RBC: 2.73 MIL/uL — AB (ref 4.22–5.81)
RDW: 14.2 % (ref 11.5–15.5)
WBC: 7.8 10*3/uL (ref 4.0–10.5)

## 2014-11-05 LAB — COMPREHENSIVE METABOLIC PANEL
ALT: 113 U/L — ABNORMAL HIGH (ref 0–53)
ANION GAP: 12 (ref 5–15)
AST: 56 U/L — ABNORMAL HIGH (ref 0–37)
Albumin: 2 g/dL — ABNORMAL LOW (ref 3.5–5.2)
Alkaline Phosphatase: 120 U/L — ABNORMAL HIGH (ref 39–117)
BUN: 26 mg/dL — AB (ref 6–23)
CALCIUM: 8.4 mg/dL (ref 8.4–10.5)
CO2: 21 mmol/L (ref 19–32)
CREATININE: 1.02 mg/dL (ref 0.50–1.35)
Chloride: 106 mmol/L (ref 96–112)
GFR calc Af Amer: 90 mL/min — ABNORMAL LOW (ref >90)
GFR calc non Af Amer: 77 mL/min — ABNORMAL LOW (ref >90)
GLUCOSE: 92 mg/dL (ref 70–99)
Potassium: 4.1 mmol/L (ref 3.5–5.1)
Sodium: 139 mmol/L (ref 135–145)
TOTAL PROTEIN: 6.2 g/dL (ref 6.0–8.3)
Total Bilirubin: 0.5 mg/dL (ref 0.3–1.2)

## 2014-11-05 LAB — PSA: PSA: 2.33 ng/mL (ref <=4.00)

## 2014-11-05 LAB — RETICULOCYTES
RBC.: 2.68 MIL/uL — AB (ref 4.22–5.81)
RETIC COUNT ABSOLUTE: 61.6 10*3/uL (ref 19.0–186.0)
Retic Ct Pct: 2.3 % (ref 0.4–3.1)

## 2014-11-05 LAB — PROTIME-INR
INR: 3.48 — AB (ref 0.00–1.49)
Prothrombin Time: 35.2 seconds — ABNORMAL HIGH (ref 11.6–15.2)

## 2014-11-05 LAB — HEPATITIS B SURFACE ANTIBODY,QUALITATIVE: HEP B S AB: NONREACTIVE

## 2014-11-05 LAB — HEPATITIS B CORE ANTIBODY, TOTAL: Hep B Core Total Ab: NEGATIVE

## 2014-11-05 MED ORDER — TRIAMCINOLONE ACETONIDE 40 MG/ML IJ SUSP
80.0000 mg | Freq: Once | INTRAMUSCULAR | Status: DC
  Filled 2014-11-05: qty 2

## 2014-11-05 MED ORDER — OXYCODONE HCL 5 MG PO TABS
5.0000 mg | ORAL_TABLET | ORAL | Status: DC | PRN
  Administered 2014-11-05 – 2014-11-06 (×2): 5 mg via ORAL
  Filled 2014-11-05 (×2): qty 1

## 2014-11-05 MED ORDER — BOOST / RESOURCE BREEZE PO LIQD
1.0000 | Freq: Two times a day (BID) | ORAL | Status: DC
  Administered 2014-11-06 (×2): 1 via ORAL

## 2014-11-05 MED ORDER — LIDOCAINE HCL 1 % IJ SOLN
10.0000 mL | Freq: Once | INTRAMUSCULAR | Status: DC
  Filled 2014-11-05: qty 10

## 2014-11-05 MED ORDER — WARFARIN SODIUM 2 MG PO TABS
2.0000 mg | ORAL_TABLET | Freq: Once | ORAL | Status: AC
  Administered 2014-11-05: 2 mg via ORAL
  Filled 2014-11-05: qty 1

## 2014-11-05 MED ORDER — FERROUS SULFATE 325 (65 FE) MG PO TABS
325.0000 mg | ORAL_TABLET | Freq: Every day | ORAL | Status: DC
  Administered 2014-11-05 – 2014-11-06 (×2): 325 mg via ORAL
  Filled 2014-11-05 (×3): qty 1

## 2014-11-05 MED ORDER — HYDROMORPHONE HCL 2 MG PO TABS
1.0000 mg | ORAL_TABLET | Freq: Two times a day (BID) | ORAL | Status: DC
  Administered 2014-11-05 – 2014-11-06 (×3): 1 mg via ORAL
  Filled 2014-11-05 (×3): qty 1

## 2014-11-05 MED ORDER — LORAZEPAM 1 MG PO TABS
1.0000 mg | ORAL_TABLET | ORAL | Status: AC
  Administered 2014-11-05: 1 mg via ORAL
  Filled 2014-11-05: qty 1

## 2014-11-05 MED ORDER — HYDROMORPHONE HCL 2 MG PO TABS: 1.0000 mg | ORAL_TABLET | Freq: Two times a day (BID) | ORAL | Status: DC | PRN

## 2014-11-05 MED ORDER — ACETAMINOPHEN 650 MG RE SUPP: 650.0000 mg | Freq: Four times a day (QID) | RECTAL | Status: DC | PRN

## 2014-11-05 MED ORDER — LIDOCAINE HCL 1 % IJ SOLN
20.0000 mL | Freq: Once | INTRAMUSCULAR | Status: DC
  Filled 2014-11-05: qty 20

## 2014-11-05 MED ORDER — ACETAMINOPHEN 325 MG PO TABS: 650.0000 mg | ORAL_TABLET | Freq: Four times a day (QID) | ORAL | Status: DC | PRN

## 2014-11-05 NOTE — Progress Notes (Signed)
ANTICOAGULATION CONSULT NOTE - Follow Up Consult  Pharmacy Consult:  Coumadin Indication: atrial fibrillation  Allergies  Allergen Reactions  . Codeine Hives  . Ketorolac Tromethamine     REACTION: hives    Patient Measurements: Height: 5' 6"  (167.6 cm) Weight: 154 lb 5.2 oz (70 kg) IBW/kg (Calculated) : 63.8  Vital Signs: Temp: 98.4 F (36.9 C) (02/25 0505) Temp Source: Oral (02/25 0505) BP: 121/70 mmHg (02/25 0505) Pulse Rate: 85 (02/25 0505)  Labs:  Recent Labs  11/03/14 1720 11/04/14 0623 11/05/14 0545  HGB 8.7* 8.8* 8.2*  HCT 25.4* 24.9* 24.2*  PLT 211 230 242  LABPROT 46.5* 49.3* 35.2*  INR 4.96* 5.20* 3.48*  CREATININE 1.34 1.13 1.02  CKTOTAL 243*  --   --     Estimated Creatinine Clearance: 68.6 mL/min (by C-G formula based on Cr of 1.02).  Assessment: 89 YOM with history of Afib presented with nausea, vomiting, diarrhea and abdominal pain.  He was on Coumadin PTA and there was confusion regarding his regimen and thus, he was taking more than the intended dose. INR now decreasing and would anticipate INR to be <3 tomorrow. H/H is stable and no bleeding noted.   Goal of Therapy:  INR 2-3  Plan:  1. Give low-dose coumadin 17m PO x 1 tonight to prevent INR from dropping subtherapeutic 2. F/u AM INR  RSalome Arnt PharmD, BCPS Pager # [phone]/25/2016 1:11 PM

## 2014-11-05 NOTE — Progress Notes (Signed)
Family Medicine Teaching Service Daily Progress Note Intern Pager: [phone]  Patient name: Jesse Franklin Medical record number: [id number] Date of birth: 1950/09/22 Age: 64 y.o. Gender: male  Primary Care Provider: Chrisandra Netters, MD Consultants: none Code Status: Full (confirmed on admission)  Pt Overview and Major Events to Date:  2/23: Admitted from [facility] clinic with bilateral knee pain and swelling  Assessment and Plan: Jesse Franklin is a 64 y.o. male presenting directly from [facility] with bilateral knee swelling and pain, also with R-wrist, R-great toe pain suspicious for subacute polyarticular gout flare. PMH is significant for CAD, AFib (anticoag coumadin), HTN, HLD, gout (L-knee, s/p drain 10/12/14), supra-therapeutic INR 10/2014, hypocalcemia, hypomagnesemia  # Polyarticular joint swelling and pain, likely subacute gout flare: Recent intermittent gout flare L-knee since 08/2014 with multiple office visits, s/p drain and steroid inj at [facility] on 10/12/14. Seen in office 10/27/14 and thought to have another flare.Rx'd prednisone (89m x 5 days) without improvement and dilaudid at that time. Low suspision for infectious etiology, unlikely septic arthritis (afebrile, multiple joints, WBC 5). No family hx of rheumatologic disorders. Could have a component of OA. - Uric 4.9 (may not be elevated in acute flare), sed rate 138.  - No joint imaging since 2012 - repeat bilateral standing knee imaging after aspiration - Resume Uloric (chosen due to coumadin interaction with allopurinol). Will consider switching this medication as patient is having more flares and AST and ALT elevation since initiation.  - Pain regimen: Dilaudid 149mq12 hours sch - Prednisone 50100md until symptoms resolved (2/23>>) - Will aspirate bilateral knee today - Activity with assistance - PT/OT to start when pain improves. Previously declined HH,Jesse Franklin need to re-evaluate for SNF vs HH  # 17 lbs weight loss in the setting of  elevated sed rate and possible bone pain: Patient is at risk of cancer given his age and smoking history. Pt had normal colonoscopy in 11/2013. Screening CXR with no acute process. PSA 2.33. Hep B non-reactive, Hep C negative.  - Continue to monitor  # Normocytic Anemia: Pt with hgb 8.8>8.2 this admission. B/l 12-13. FOBT negative. Previous iron studies in 2011 consistent with IDA. Normal colonoscopy in 11/2013 - Will not repeat iron panel now as ferritin will likely be elevated in acute illness - Retic count   # Elevated Liver Enzymes: Elevated since admission. ALT>AST (c/w viral hep), but hep B non-reactive and hep C negative. - AST 102>56 - ALT 162>113 - Will hold crestor for now - Consider administering Hep B vaccine  # Hypomagnesemia: 1.1 this admission.  - Continue home MagOx 400 BID  # Hypocalcemia: Recently low since 2/12 6-7, unclear if taking calcium PO at home after recent discharge. WNL this admission 8.8>9.0 - PO replacement daily  # Elevated CK: 3950 on 2/16, CK 243 this admission. TSH and Phos WNL - UA with trace hgb, protein 30, few squamous epi, few bacteria, +granular casts - continue to monitor  # Stage 2 sacral ulcer: Seems to have developed recently when patient was immobilized at home for 3 days. No sign of secondary infection.  - Wound care consulted who recommend sacral foam dressing to area to prevent further breakdown to be changed every 5 days.  # Suprathrapeutic INR (Coumadin anticoagulation for AFib): Patient seems to have theraputic INR historically. INR supratherapeutic in Nov. 2015 and again recently. Patient may be doubling ujup on his medications. No history of active bleeding. Followed by Cardiology for INR checks - INR 6.4>4.96>5.20>3.48 -  Hold Coumadin - Consult pharm for coumadin dosing - Consider vitamin K if needed for reversal.   # Atrial Fibrillation, chronic - Hold Coumadin - (ordered consult per pharm)  # H/o CAD: Stable no active CP -  Continue rosuvastatin 30m daily  # HTN: BPs low-normal - Hold Amlodipine and Altace for now.   # GERD - Continue PPI  # HLD:  - hold Crestor for now  # Malnutrition: Patient appears malnourished. Albumin 2.1 (3.5 in clinic on 2/16) in the setting of recent immobilization at home.  - Consult nutrition  FEN/GI: Heart healthy Prophylaxis: anticoagulated with coumadin (currently holding with supratherapeutic INR)  Disposition: Admit to FPTS inpatient, treat acute poly articular gout flare, supratherapeutic INR without active bleeding. Will need PT/OT eval [facility]   Subjective:  Patient states he is still in pain. His knees are very achy, but he does not have the sledgehammer feeling. He states it is worse with movement. He says his right great toe is worsening with warmth and pain. He denies SOB, CP, abdominal pain. He is taking good PO. He is not asking for pain medications when he is in pain, he is waiting for the nurse to come. Due to his codeine and ketorolac allergies he has not gotten any scheduled pain meds and just as IV meds for severe pain.   Objective: Temp:  [98.4 F (36.9 C)-98.8 F (37.1 C)] 98.4 F (36.9 C) (02/25 0505) Pulse Rate:  [79-85] 85 (02/25 0505) Resp:  [16-20] 19 (02/25 0505) BP: (114-121)/(70-83) 121/70 mmHg (02/25 0505) SpO2:  [99 %] 99 % (02/25 0505) Physical Exam: General: thin appearing elderly gentleman, appears comfortable, cooperative, NAD HEENT: NCAT, MMM, o/p clear, poor dentition Cardiovascular: irregularly irregular, normal S1, S2, no murmurs Respiratory: CTAB. No wheezing, crackles. Non-labored. Good air movement. Speaks full sentences Abdomen: soft, NT/ND, Extremities: increased effusions at b/l knees with warmth but no erythema L worse than R. Pain with palpation over left patella. Right wrist with inflamed/enlarged ulnar styloid process +TTP, R great toe MTP with enlarged tender nodule with erythema and  moderate TTP. Tophus evident over left elbow. Prominent nail clubbing of all 10 digits MSK: ROM limited by pain Skin: warm, dry; skin abrasion on R posterior thigh & R elbow. B/l stage 2 on sacral area was covered with intact dressing Neuro: awake, alert, oriented x3, grossly non-focal  Laboratory:  Recent Labs Lab 11/03/14 1720 11/04/14 0623 11/05/14 0545  WBC 5.0 5.7 7.8  HGB 8.7* 8.8* 8.2*  HCT 25.4* 24.9* 24.2*  PLT 211 230 242    Recent Labs Lab 11/03/14 1720 11/04/14 0623 11/05/14 0545  NA 137 137 139  K 4.0 3.9 4.1  CL 107 108 106  CO2 23 23 21   BUN 21 25* 26*  CREATININE 1.34 1.13 1.02  CALCIUM 8.8 9.0 8.4  PROT 7.2  --  6.2  BILITOT 0.8  --  0.5  ALKPHOS 143*  --  120*  ALT 162*  --  113*  AST 102*  --  56*  GLUCOSE 138* 143* 92   CK total - 243  Sed rate - 138  TSH - 2.436  INR - 4.96 > 5.20> 3.48 PT - 46.5 > 49.3> 35.2  PSA - 2.33  Hep B - non reactive Hep C - negative   Imaging/Diagnostic Tests: 2/24/206 CXR: FINDINGS: Cardiac silhouette is mildly enlarged. No mediastinal or hilar masses or evidence of adenopathy. Clear lungs. No pleural effusion pneumothorax.  Bony thorax is intact. IMPRESSION: No acute cardiopulmonary disease.  8/29/21012 DG Right Knee: Findings: There are mild degenerative changes but no acute bony findings or osteochondral lesions. A moderate sized joint effusion is noted. IMPRESSION: Mild degenerative changes and moderate sized joint effusion.  05/10/2011 DG Left Knee: Findings: There are mild tricompartmental degenerative changes. No acute bony findings or osteochondral lesions. A moderate sized joint effusion is noted. IMPRESSION: Degenerative changes and joint effusion.   Tomasa Rand Dinizo, Med Student 11/05/2014, 8:51 AM [facility] Intern pager: [phone], text pages welcome  I have examined and discussed the patient with the medical student and FPTS/attending.    Resident  Note: Briefly, Pt was admitted for a presumed gout flare in multiple joints. He was found down, after being on the floor for 3 days. Pt had a gout flare with aspiration/gout proven effusion 1.5 months ago with steroid injection. Pt states this is the worse gout flare he has every had and it is in his wrist as well, which he has never had prior. He has had an extended course of steroids this month for gout.Today he states the worse of his pain is in his knee (left>right). He also had left elbow tophi and right great toe pain. Pt has been on Uloric over allopurinol secondary to coumadin interaction with allopurinol. Secondary to being  down for 3 days he has also suffered a stage 1 sacral Pressure ulcer. His INR is supratheraputic as well on this admisision. In speaking with the patient it is likely he was accidentally doubling up the dosing after a recent medication alteration. BP 121/70 mmHg  Pulse 85  Temp(Src) 98.4 F (36.9 C) (Oral)  Resp 19  Ht 5' 6"  (1.676 m)  Wt 154 lb 5.2 oz (70 kg)  BMI 24.92 kg/m2  SpO2 99%  General: thin appearing elderly gentleman, appears comfortable, cooperative, NAD, talkative. Pleasant HEENT: NCAT, MMM, o/p clear, poor dentition Cardiovascular: irregularly irregular, normal S1, S2, no murmurs Respiratory: CTAB. No wheezing, crackles. Non-labored. Good air movement. Abdomen: soft, NT/ND, BS present.  Extremities: Moderate, effusions at b/l knees with warmth but no erythema L worse than R. Pain with palpation over left patella. Right wrist with inflamed/enlarged ulnar styloid process +TTP, R great toe MTP with enlarged tender nodule with erythema and moderate TTP. Tophus evident over left elbow.  MSK: ROM limited by pain, extension decreased bialteral Skin: warm, dry; skin abrasion on R posterior thigh & R elbow. B/l stage 2 on sacral area was covered with intact dressing Neuro: awake, alert, oriented x3, grossly non-focal Assessment and Plan:  # Polyarticular  joint swelling and pain, likely subacute gout flare: Bilateral knee aspirations and injection of steroid completed today. Pt stated improvement on pain.  - F/U on aspiration studies  - DG knee studies (sunrise, standing A/P) - Attempting to find a long acting pain medication for him to take at home. It is uncertain what and how much pain medications he is taking in the outpatient setting. Therefore we will start 1 mg PO dilaudid BID (home medication) and 5 mg oxyIR every 4 hour PRN. Will then calculate need in 24 hours and increase long acting to accommodate pain.  - Prednisone 44m qd until symptoms resolved (2/23>>) - Activity with assistance - At the very least [facility] and does not seem to completely understand medications. Personal home services and help with medication management would be needed for him.  # 17  lbs weight loss in the setting of elevated sed rate and possible bone pain: Patient is at risk of cancer given his age and smoking history. Pt had normal colonoscopy in 11/2013. - Screening CXR>> negative - PSA>> normal - Hep B, Hep C >> negative - Nutrition consult, recent weight loss after acute illness  # Stage 2 sacral ulcer: Seems to have developed recently when patient was immobilized at home for 3 days. No sign of secondary infection. Wound care consulted who recommend sacral foam dressing to area to prevent further breakdown to be changed every 5 days.  # Suprathrapeutic INR (Coumadin anticoagulation for AFib):  Patient may be doubling up on his medications. No history of active bleeding. Followed by Cardiology for INR checks - INR monitoring per pharmacy - Hold Coumadin>> Pharm managing - Pharm resident teaching on all prescribed medications and need for each.  - Will attempt to get help in the home with medications.   # Atrial Fibrillation, chronic >> Coumadin per pharmacy  # H/o CAD: Stable no active CP >> holding statin secondary to  elevated LFT  # HTN: BPs low-normal >> Hold Amlodipine and Altace for now.   # Malnutrition: Patient appears malnourished. Albumin 2.1 (3.5 in clinic on 2/16) in the setting of recent immbolizationat home.  - Consult nutrition Howard Pouch DO [facility] PGY3

## 2014-11-05 NOTE — Progress Notes (Signed)
RN notified by lab of two left knee specimens received for analysis,Dr. Gottschoak aware.

## 2014-11-05 NOTE — Evaluation (Signed)
Occupational Therapy Evaluation Patient Details Name: Jesse Franklin MRN: [id number] DOB: 05/30/1951 Today's Date: 11/05/2014    History of Present Illness Pt is a [name] admitted 11/03/14 with Bil Knee pain and swelling from [facility], in addition to R wrist and R 1st toe pain, likely subacute polyarticular gout flare. PMH of CAD, Afib (on coumadin), HTN, HLD, gout (s/p knee drain 10/12/14), supra-therapeutic INR, hypocalcemia, hypomagnesemia. Pt reports he fell at home and stayed on the floor ~3days. INR elevated in ED, now trending down with coumadin held.    Clinical Impression   PTA pt lived at home alone and was independent with ADLs. Pt was recently d/c from hospital and returned home, but reports that he had a fall and was on the floor for 3 days and unable to get up. Pt requires min A for functional mobility and assist for LB ADLs due to knee pain and decreased ROM. Pt will benefit from SNF at d/c for ST rehab to increase safety, due to risk of falls. Pt will benefit from acute OT to address independence with ADLs and functional mobility.     Follow Up Recommendations  SNF;Supervision/Assistance - 24 hour    Equipment Recommendations  3 in 1 bedside comode    Recommendations for Other Services       Precautions / Restrictions Restrictions Weight Bearing Restrictions: No      Mobility Bed Mobility Overal bed mobility: Independent                Transfers Overall transfer level: Needs assistance Equipment used: Rolling walker (2 wheeled) Transfers: Sit to/from Stand Sit to Stand: Min assist         General transfer comment: Min A to power up and balance due to knee pain.          ADL Overall ADL's : Needs assistance/impaired Eating/Feeding: Independent;Sitting   Grooming: Wash/dry face;Oral care;Min guard;Standing   Upper Body Bathing: Min guard;Standing   Lower Body Bathing: Minimal assistance;Sit to/from stand   Upper Body Dressing : Min  guard;Standing   Lower Body Dressing: Moderate assistance;Sit to/from stand   Toilet Transfer: Minimal assistance;Ambulation;RW Toilet Transfer Details (indicate cue type and reason): sit<>stand from EOB Toileting- Clothing Manipulation and Hygiene: Minimal assistance;Sit to/from stand       Functional mobility during ADLs: Minimal assistance;Rolling walker General ADL Comments: Pt stood at sink for sponge bath, then left with PT to ambulate in hallway     Vision  Pt reports no change from baseline.           Pertinent Vitals/Pain Pain Assessment: Faces Faces Pain Scale: Hurts even more Pain Location: Bil knees Pain Descriptors / Indicators: Aching Pain Intervention(s): Limited activity within patient's tolerance;Monitored during session;Repositioned     Hand Dominance Left   Extremity/Trunk Assessment Upper Extremity Assessment Upper Extremity Assessment: Overall WFL for tasks assessed   Lower Extremity Assessment Lower Extremity Assessment: Defer to PT evaluation   Cervical / Trunk Assessment Cervical / Trunk Assessment: Normal   Communication Communication Communication: No difficulties   Cognition Arousal/Alertness: Awake/alert Behavior During Therapy: WFL for tasks assessed/performed Overall Cognitive Status: Within Functional Limits for tasks assessed                                Home Living Family/patient expects to be discharged to:: Private residence Living Arrangements: Alone   Type of Home: Apartment Home Access: Stairs to enter Entrance  Stairs-Number of Steps: 3 Entrance Stairs-Rails: Right;Left Home Layout: Two level Alternate Level Stairs-Number of Steps: 14 Alternate Level Stairs-Rails: Right           Home Equipment: None          Prior Functioning/Environment Level of Independence: Independent             OT Diagnosis: Generalized weakness;Acute pain   OT Problem List: Decreased strength;Decreased range of  motion;Decreased activity tolerance;Impaired balance (sitting and/or standing);Decreased knowledge of use of DME or AE;Pain   OT Treatment/Interventions: Self-care/ADL training;Therapeutic exercise;Energy conservation;DME and/or AE instruction;Therapeutic activities;Patient/family education;Balance training    OT Goals(Current goals can be found in the care plan section) Acute Rehab OT Goals Patient Stated Goal: Go home OT Goal Formulation: With patient Time For Goal Achievement: 11/19/14 Potential to Achieve Goals: Good ADL Goals Pt Will Perform Grooming: with supervision;standing Pt Will Perform Lower Body Bathing: with supervision;sit to/from stand Pt Will Perform Lower Body Dressing: with supervision;sit to/from stand Pt Will Transfer to Toilet: with supervision;ambulating Pt Will Perform Toileting - Clothing Manipulation and hygiene: with supervision;sit to/from stand Pt Will Perform Tub/Shower Transfer: with supervision;ambulating  OT Frequency: Min 2X/week   Barriers to D/C: Decreased caregiver support             End of Session Equipment Utilized During Treatment: Gait belt;Rolling walker  Activity Tolerance: Patient limited by pain;Patient limited by fatigue Patient left: Other (comment) (ambulated with PT in hallway)   Time: 7322-0254 OT Time Calculation (min): 21 min Charges:  OT General Charges $OT Visit: 1 Procedure OT Evaluation $Initial OT Evaluation Tier I: 1 Procedure G-Codes:    Juluis Rainier 11-11-2014, 10:56 AM  Cyndie Chime, OTR/L Occupational Therapist [phone] (pager)

## 2014-11-05 NOTE — Evaluation (Signed)
Physical Therapy Evaluation Patient Details Name: Jesse Franklin MRN: [id number] DOB: 1951/02/03 Today's Date: 11/05/2014   History of Present Illness  Pt is a [name] admitted 11/03/14 with Bil Knee pain and swelling from [facility], in addition to R wrist and R 1st toe pain, likely subacute polyarticular gout flare. PMH of CAD, Afib (on coumadin), HTN, HLD, gout (s/p knee drain 10/12/14), supra-therapeutic INR, hypocalcemia, hypomagnesemia. Pt reports he fell at home and stayed on the floor ~3days. INR elevated in ED, now trending down with coumadin held.   Clinical Impression  Pt admitted with above diagnosis. Pt currently with functional limitations due to the deficits listed below (see PT Problem List). Pt will benefit from skilled PT to increase their independence and safety with mobility to allow discharge to the venue listed below.  Due to pt living alone with bedroom on 2nd floor and 8/10 knee pain, decreased ROM, and multiple hospitalizations, recommend short term SNF after d/c from acute care to work to maximize independence with functional mobility before going home.     Follow Up Recommendations SNF    Equipment Recommendations  Rolling walker with 5" wheels    Recommendations for Other Services       Precautions / Restrictions Precautions Precautions: Fall Restrictions Weight Bearing Restrictions: No      Mobility  Bed Mobility Overal bed mobility: Independent                Transfers Overall transfer level: Needs assistance Equipment used: Rolling walker (2 wheeled) Transfers: Sit to/from Stand Sit to Stand: Min assist         General transfer comment: Pt up with OT upon arrival. stand > sit with MIN A and increase in pain due to decreased knee flexion  Ambulation/Gait Ambulation/Gait assistance: Min assist Ambulation Distance (Feet): 100 Feet Assistive device: Rolling walker (2 wheeled) Gait Pattern/deviations: Antalgic;Decreased step length -  left;Decreased step length - right Gait velocity: decreased   General Gait Details: heavy use of UE with gait with very little knee flexion and with stiff legged gait.  Stairs            Wheelchair Mobility    Modified Rankin (Stroke Patients Only)       Balance Overall balance assessment: Needs assistance         Standing balance support: Bilateral upper extremity supported Standing balance-Leahy Scale: Poor Standing balance comment: Requires B UE support due to 8/10 B knee pain                             Pertinent Vitals/Pain Pain Assessment: 0-10 Pain Score: 8  Faces Pain Scale: Hurts even more Pain Location: B knees after gait Pain Descriptors / Indicators: Aching Pain Intervention(s): Patient requesting pain meds-RN notified;Limited activity within patient's tolerance;Repositioned    Home Living Family/patient expects to be discharged to:: Private residence Living Arrangements: Alone   Type of Home: Apartment Home Access: Stairs to enter Entrance Stairs-Rails: Psychiatric nurse of Steps: 3 Home Layout: Two level Home Equipment: None      Prior Function Level of Independence: Independent               Hand Dominance   Dominant Hand: Left    Extremity/Trunk Assessment   Upper Extremity Assessment: Defer to OT evaluation           Lower Extremity Assessment: RLE deficits/detail;LLE deficits/detail RLE Deficits / Details: limited knee ROM  LLE Deficits / Details: limited knee ROM  Cervical / Trunk Assessment: Normal  Communication   Communication: No difficulties  Cognition Arousal/Alertness: Awake/alert Behavior During Therapy: WFL for tasks assessed/performed Overall Cognitive Status: Within Functional Limits for tasks assessed                      General Comments      Exercises        Assessment/Plan    PT Assessment Patient needs continued PT services  PT Diagnosis Acute  pain;Difficulty walking   PT Problem List Decreased strength;Decreased range of motion;Decreased activity tolerance;Decreased balance;Decreased mobility;Pain;Decreased knowledge of use of DME  PT Treatment Interventions Gait training;Stair training;Functional mobility training;Therapeutic activities;Therapeutic exercise;DME instruction;Balance training;Patient/family education   PT Goals (Current goals can be found in the Care Plan section) Acute Rehab PT Goals Patient Stated Goal: Go home PT Goal Formulation: With patient Time For Goal Achievement: 11/19/14 Potential to Achieve Goals: Good    Frequency Min 3X/week   Barriers to discharge Inaccessible home environment;Decreased caregiver support      Co-evaluation               End of Session Equipment Utilized During Treatment: Gait belt Activity Tolerance: Patient tolerated treatment well;Patient limited by pain Patient left: in chair;with call bell/phone within reach Nurse Communication: Mobility status         Time: 0379-4446 PT Time Calculation (min) (ACUTE ONLY): 16 min   Charges:   PT Evaluation $Initial PT Evaluation Tier I: 1 Procedure     PT G Codes:        Sabine Tenenbaum L. Tamala Julian, [place] Pager [phone] 11/05/2014  Jesse Franklin 11/05/2014, 12:22 PM

## 2014-11-05 NOTE — Discharge Summary (Signed)
[facility] Discharge Summary  Patient name: Jesse Franklin Medical record number: [id number] Date of birth: 1951/01/13 Age: 64 y.o. Gender: male Date of Admission: 11/03/2014  Date of Discharge: 11/06/2014 Admitting Physician: Lind Covert, MD  Primary Care Provider: Chrisandra Netters, MD Consultants: none  Indication for Hospitalization: bilateral knee pain and swelling  Discharge Diagnoses/Problem List:  Poly articular joint swelling and pain, likely subaute gout flare - Improving Supra therapeutic INR - Resolved Gout - Chronic Normocytic Anemia - Stable Hypomagnesemia  Hypocalcemia - Stable Elevated CK Stage 2 Sacral Ulcer - New  Atrial Fibrillation - Chronic H/o CAD - Stable Hypertension - Stable GERD - Stable  Disposition: Home with home health  Discharge Condition: Stable  Discharge Exam:  Temp: [98.1 F (36.7 C)-99 F (37.2 C)] 98.1 F (36.7 C) (02/26 0628) Pulse Rate: [83-92] 92 (02/26 0628) Resp: [16-20] 16 (02/26 0628) BP: (109-134)/(74-76) 134/76 mmHg (02/26 0628) SpO2: [99 %-100 %] 100 % (02/26 2197) Physical Exam: General: thin appearing elderly gentleman, appears comfortable, willing to move from lying to sitting without difficulty. Cooperative, NAD HEENT: NCAT, MMM, o/p clear, poor dentition Cardiovascular: irregularly irregular, normal S1, S2, no murmurs Respiratory: CTAB. No wheezing, crackles. Non-labored. Good air movement. Speaks full sentences Abdomen: soft, NT/ND, Extremities: Improved effusions at b/l knees, primarily suprapatellar, but substantially approved. No warmth appreciated over knees. Bandaids over site of aspiration intact with minimal blood saturation. Mild pain with palpation over left patella. R great toe MTP with enlarged tender nodule with erythema and moderate TTP. Tophus evident over left elbow. Prominent nail clubbing of all 10 digits MSK: Grossly intact, willing to get out of bed and  ambulate without difficulty Skin: warm, dry; skin abrasion on R posterior thigh & R elbow. B/l stage 2 on sacral area without erythema, drainage or bleeding Neuro: awake, alert, oriented x3, grossly non-focal  Brief Hospital Course:  Jesse Franklin is a 64 y.o. male who was admitted to [facility] directly from [facility] with bilateral knee swelling and pain, also with R-wrist, R-great toe pain suspicious for subacute polyarticular gout flare. PMH is significant for CAD, AFib (anticoag coumadin), HTN, HLD, gout (L-knee, s/p drain 10/12/14), supra-therapeutic INR 10/2014, hypocalcemia, hypomagnesemia.  Polyarticular joint swelling and pain, likely subacute gout flare: Pt with recurrent gout flares in L knee since 08/2014 with multiple office visits. Pt s/p drain and steroid inj at [facility] on 10/12/14. He had an admission 2/11-2/14 for abdominal pain and diarrhea. He was seen in office on 10/27/14 with a a gout flare, prescribed dilaudid and prednisone (36m x 5 days) without improvement. Presented to clinic 2/21 with bilateral knee pain, that he believed was related to gout. Bilateral knee effusions, with minimal erythema. R great toe MTP enlarged nodule with erythema and TTP. Uric acid 4.9, sed rate 138. Low suspision for septic arthritis (afebrile, multiple joints, WBC 5). Imaging showed mild osteoarthritis (R >L) and was not believed to be the cause of his acute pain.   Prednisone 52mdaily initiated on 2/23. Pain was controlled with IV pain medications originally. Bilateral knees aspirated (70cc of yellow, thick fluid from left and 30 cc of yellow to red fluid from right) and injected with solu-medrol/lidocaine mixture. Synovial fluid analysis showed monosodium urate crystals and moderate WBC, consistent with gout. Synovial fluid cultures NGTD x 1 day at time of discharge. Patient was continued on home Uloric, however may consider switching this medication as patient is having more flares and AST and ALT elevation since  initiation. After joint aspiration, patient had dramatically improved pain and ability to ambulate. He continued to complain of unchanged pain and warmth from his right great toe MTP. The aspiration clouded the picture and we were unable to determine if symptom resolution had truly began, so the patient will continue 86m prednisone daily upon discharge. Patient has been on steroids almost continuously since 2/16 and may require a taper as an outpatient. PT/OT evaluated the patient and recommended SNF, which he declined. Patient agreed to home health.   Weight loss (17lbs) in setting of elevated sed rate: Patient reports 10 lbs weight loss during subactue gout flares over the last month. Per chart review, patient has had 17 lbs weight loss over the last 2 years, though the patient states he had tried to put on weight and  Increased risk of cancer as patient is a smoker. Screening CXR negative. PSA normal. Normal colonoscopy in 11/2013.    Suprathrapeutic INR (Coumadin anticoagulation for AFib): Patient may be doubling up on his mediations at home. No history of active bleeding. INR was 6.4 in clinic immediately prior to admission. Coumadin was held per pharmacy until INR down-trended to 2.23. The patient was restarted on his home Coumadin regimen (517mdaily, except 7.5 mg on Wednesday) at discharge. Pharmacy had educational session with patient about his medications prior to discharge. INR should be closely monitored as an outpatient.   Normocytic Anemia: Pt with hgb 8.8>8.2 this admission. Baseline 12-13. Previous iron studies in 2011 consistent with IDA. Retic 2.2%, FOBT negative. Normal colonoscopy in 11/2013. Given gout history, patient may have anemia of chronic disease, however, we not repeat iron panel during acute illness as ferritin will likely be falsely elevated. Patient was started on ferrous sulfate 32530mablet daily, if hgb does not increase 1 point/ week over the next few weeks, this is unlikely  to be IDA and iron supplementation can be discontinued.   Elevated Liver Enzymes: Moderately elevated, but downtrending. ALT>AST (c/w viral hep). Hep B non-reactive and Hep C negative. We considered administering Hep B vaccine, however per CDC, the vaccine is not indicated in this patient. May consider giving vaccine in the future as an outpatient.   Hypomagnesemia: Mg recently (0.4-1.3) on MagOx 400 BID at home, which was continued throughout admission. Mag stable at 1.1 this admission, despite oral repletion.   Hypocalcemia: Ca 6-7 recently (2/11-2/16), unclear if taking calcium PO at home after recent discharge. Ca WNL this admission. PO replacement daily.  Elevated CK: CK 3950 on 2/16, CK 243 this admission. TSH and Phos WNL. UA with trace hgb, protein 30, few squamous epi, few bacteria, +granular casts. Crestor held and other hepatotoxic medications avoided throughout hospitalization.   Stage 2 sacral ulcer: Seems to have developed recently when patient was immobilized at home for 3 days prior to admission. No sign of secondary infection. Wound care consulted and recommend sacral foam dressing to area to prevent further breakdown to be changed every 5 days.  Chronic Atrial Fibrillation: Stable. Coumadin as above.  H/o CAD: MI in 2013, appears stable. No CP throughout admission. Lipid panel from 07/09/2014 WNL except low HDL. Home crestor was discontinued in the setting of elevated transaminases. This will need to be readdressed as an outpatient.   HTN: Initially hypotensive (90s/60s) and home amlodipine (49m149mily) and ramipril (10 mg BID) were held. Pt was given IVFs and remained normotensive without his home medications. His home medications were held at discharge, but may need to be restarted as  an outpatient.   GERD: Protonix given throughout admission   Malnutrition: Patient appears malnourished. Albumin 2.1 (3.5 in clinic on 2/16) in the setting of recent immobilization at home.  Nutrition was consulted and recommended starting Resource Breeze BID.   Issues for Follow Up:  1. Prednisone: Prednisone 7m daily until follow up appointment. If symptom resolution, steroids can be discontinued or tapered. Of note, patient has been on oral steroids almost continuously since 2/16. 2. Colchicine: Patient should resume his colchicine once he has completed his prednisone course. Prophylactic Colchicine dose 0.631monce or twice daily, maximum 1.37m66maily.  3. Pain control: Discharged with 30 tablets of Oxycodone 5mg9m PRN for pain, told to stop taking tramadol and dilaudid.  4. Supratherapeutic INR: Dose taken incorrectly at home. Coumadin held while admitted. Discharged on home regimen (5mg 96mly, 7.5 mg on Wednesday), per pharmacy's recommendation. INR should be monitored 2/29 at Anticoagulation clinic. 5. Gout flare: Synovial fluid cultures NGTD x 1 day at time of discharge  6. BP meds: Patient remained normotensive without home medications. Held at discharge, but may need to be restarted as an outpatient.  7. Anemia: Unclear if ACD or IDA. Hgb 8.6 at discharge. Continue daily iron supplementation, if not seeing 1 pt increase in hgb/week over next few weeks, likely not IDA and consider discontinuing supplementation.  8. Elevated Liver Enzymes: Downtrended during admission, but still elevated at discharge. This may be related to Uloric. Consider monitoring as an outpatient 9. Crestor: Held at discharge due to elevated transaminases. Consider restarting as an outpatient if enzymes return to normal 10. Uloric: Patient started in 08/2014 (chosen over allopurinol because of interaction with coumadin), patient has had more flares and elevated transaminases since initiation. Consider changing medication. If necessary could change anticoagulation regimen to accomodate allopurinol. 11. Stage 2 Sacral Ulcer: Patient informed about management and home health nurse to change dressing 12.  Malnutrition: Nutrition recommends home nutritional supplement  Significant Procedures:  2/25: Bilateral knee aspiration and steroid injection   Significant Labs and Imaging:   Recent Labs Lab 11/03/14 1720 11/04/14 0623 11/05/14 0545  WBC 5.0 5.7 7.8  HGB 8.7* 8.8* 8.2*  HCT 25.4* 24.9* 24.2*  PLT 211 230 242    Recent Labs Lab 11/03/14 1720 11/04/14 0623 11/05/14 0545  NA 137 137 139  K 4.0 3.9 4.1  CL 107 108 106  CO2 23 23 21   GLUCOSE 138* 143* 92  BUN 21 25* 26*  CREATININE 1.34 1.13 1.02  CALCIUM 8.8 9.0 8.4  MG 1.1* 1.1*  --   PHOS 3.2  --   --   ALKPHOS 143*  --  120*  AST 102*  --  56*  ALT 162*  --  113*  ALBUMIN 2.1*  --  2.0*     Ref. Range 11/05/2014 12:30 11/05/2014 12:34  Color, Synovial Latest Range: YELLOW  YELLOW YELLOW  Appearance-Synovial Latest Range: CLEAR  TURBID (A) TURBID (A)  Crystals, Fluid No range found INTRACELLULAR MON... INTRACELLULAR MON...  WBC, Synovial Latest Range: 0-200 /cu mm 5950 (H) 8600 (H)  Neutrophil, Synovial Latest Range: 0-25 % 72 (H) 61 (H)  Lymphocytes-Synovial Fld Latest Range: 0-20 % 2 11  Monocyte-Macrophage-Synovial Fluid Latest Range: 50-90 % 26 (L) 28 (L)  Eosinophils-Synovial Latest Range: 0-1 %         Sed rate - 138  TSH - 2.436  PSA - 2.33  INR - 4.96 > 5.20> 3.48 PT - 46.5 > 49.3> 35.2  Hep B -  non reactive Hep C - negative  11/05/2014 Complete R Knee: IMPRESSION: 1. Joint effusion. Gas is identified within the effusion which may be related instrumentation or infection. 2. Osteoarthritis. These results will be called to the ordering clinician or representative by the Radiologist Assistant, and communication documented in the PACS or zVision Dashboard.  11/05/2014 Left Knee: IMPRESSION: No fracture or dislocation is seen. Mild degenerative changes. Large suprapatellar knee joint effusion.  11/05/2014 Bilateral standing AP: FINDINGS: No fracture or  dislocation is seen. Mild degenerative changes, with sharpening of the tibial spines bilaterally, and medial compartment narrowing on the left and lateral compartment narrowing on the right. IMPRESSION: Mild degenerative changes.  2/24/206 CXR: FINDINGS: Cardiac silhouette is mildly enlarged. No mediastinal or hilar masses or evidence of adenopathy. Clear lungs. No pleural effusion pneumothorax. Bony thorax is intact. IMPRESSION: No acute cardiopulmonary disease.  Results/Tests Pending at Time of Discharge:  2/25: Synovial fluid culture   Discharge Medications:    Medication List    ASK your doctor about these medications        amLODipine 10 MG tablet  Commonly known as:  NORVASC  TAKE 1 TABLET (10 MG TOTAL) BY MOUTH DAILY.     benzonatate 100 MG capsule  Commonly known as:  TESSALON  Take 1 capsule (100 mg total) by mouth 2 (two) times daily.     calcium carbonate 500 MG chewable tablet  Commonly known as:  TUMS  Chew 1 tablet (200 mg of elemental calcium total) by mouth daily.     clopidogrel 75 MG tablet  Commonly known as:  PLAVIX  Take 75 mg by mouth daily.     colchicine 0.6 MG tablet  Take 0.6 mg by mouth 2 (two) times daily.     CRESTOR 40 MG tablet  Generic drug:  rosuvastatin  TAKE 1 TABLET BY MOUTH EVERY DAY     febuxostat 40 MG tablet  Commonly known as:  ULORIC  Take 1 tablet (40 mg total) by mouth daily.     guaiFENesin 600 MG 12 hr tablet  Commonly known as:  MUCINEX  Take 1 tablet (600 mg total) by mouth 2 (two) times daily.     HYDROmorphone 2 MG tablet  Commonly known as:  DILAUDID  Take 0.5 tablets (1 mg total) by mouth every 12 (twelve) hours as needed for severe pain.     magnesium oxide 400 (241.3 MG) MG tablet  Commonly known as:  MAG-OX  Take 1 tablet (400 mg total) by mouth 2 (two) times daily.     NEXIUM 40 MG capsule  Generic drug:  esomeprazole  TAKE ONE CAPSULE BY MOUTH EVERY DAY     nitroGLYCERIN 0.4 MG SL tablet   Commonly known as:  NITROSTAT  Place 1 tablet (0.4 mg total) under the tongue as directed. Place 1 tab under tongue as directed     potassium chloride SA 20 MEQ tablet  Commonly known as:  K-DUR,KLOR-CON  Take 1 tablet (20 mEq total) by mouth daily.     predniSONE 20 MG tablet  Commonly known as:  DELTASONE  Take 1 tablet (20 mg total) by mouth daily.     ramipril 10 MG capsule  Commonly known as:  ALTACE  TAKE ONE CAPSULE BY MOUTH TWICE A DAY     traMADol 50 MG tablet  Commonly known as:  ULTRAM  Take 1 tablet (50 mg total) by mouth every 8 (eight) hours as needed.     warfarin 5 MG  tablet  Commonly known as:  COUMADIN  Take 5-7.5 mg by mouth daily. Takes 7.5 mg on Wed, and 5 mg all other days     warfarin 4 MG tablet  Commonly known as:  COUMADIN  Take 1 tablet (4 mg total) by mouth one time only at 6 PM. Take 1 and 1/2 tablets on Wednesday then take 1 tablet all the other days        Discharge Instructions: Please refer to Patient Instructions section of EMR for full details.  Patient was counseled important signs and symptoms that should prompt return to medical care, changes in medications, dietary instructions, activity restrictions, and follow up appointments.   Follow-Up Appointments:   Leroy Libman, Med Student 11/05/2014, 2:29 PM [place], [place]

## 2014-11-05 NOTE — Progress Notes (Signed)
INITIAL NUTRITION ASSESSMENT  DOCUMENTATION CODES Per approved criteria  -Non-severe (moderate) malnutrition in the context of chronic illness  Pt meets criteria for non-severe (moderate) malnutrition in the context of chronic illness as evidenced by >5% weight loss in 1 month and mild body fat and muscle mass depletion.  INTERVENTION: Provide Resource Breeze BID, each provides 250 kcal, 7.1 grams of protein, and 197 ml of H20.  NUTRITION DIAGNOSIS: Inadequate oral intake related to poor appetite;N/V as evidenced by decreased PO intake.   Goal: Pt to meet >/= 90% of estimated energy requirements  Monitor:  PO intake, weight, labs  Reason for Assessment: Consult for recent weight loss after acute incident   64 y.o. male  Admitting Dx: <principal problem not specified>  ASSESSMENT: 64 y/o male presenting directly from [facility] with bilateral knee swelling and pain, also with R-wrist, R-great toe pain suspicious of subacute polyarticular gout flare. Past medical history of CAD, AFib, HTN, HLD and supra-therapeutic INR.   Pt confirmed recent wt loss, and decreased appetite since November. Wt hx reveals17 lb wt loss; 6% loss in <1 month. Pt reported having access to food, and prepares all of his meals. He usually eats three meals daily. He reported intermittent N/V since November, and hasn't been able to eat anything in the past 4 days. He reports feeling better at this time, and has been eating 100% of meals. Pt reported no difficulty chewing or swallowing.  He is willing to try [employer].  Nutrition Focused Physical Exam:  Subcutaneous Fat:  Orbital Region: mild depletion Upper Arm Region: mild dpeletion Thoracic and Lumbar Region: WDL  Muscle:  Temple Region: mild depletion Clavicle Bone Region: mild dpeletion Clavicle and Acromion Bone Region: mild depletion Scapular Bone Region: mild depletion Dorsal Hand: WDL Patellar Region: n/a Anterior Thigh Region: n/a Posterior  Calf Region: n/a  Edema: not present  Height: Ht Readings from Last 1 Encounters:  11/03/14 5' 6"  (1.676 m)    Weight: Wt Readings from Last 1 Encounters:  11/03/14 154 lb 5.2 oz (70 kg)    Ideal Body Weight: 142 lb (64.5 kg)  % Ideal Body Weight: 108%  Wt Readings from Last 10 Encounters:  11/03/14 154 lb 5.2 oz (70 kg)  10/27/14 156 lb (70.761 kg)  10/25/14 156 lb 3.2 oz (70.852 kg)  10/12/14 156 lb (70.761 kg)  10/06/14 164 lb (74.39 kg)  08/21/14 164 lb (74.39 kg)  08/05/14 160 lb 3.2 oz (72.666 kg)  07/31/14 156 lb (70.761 kg)  07/31/14 156 lb 1.6 oz (70.806 kg)  07/08/14 167 lb (75.751 kg)    Usual Body Weight: unknown  % Usual Body Weight: -  BMI:  Body mass index is 24.92 kg/(m^2). normal  Estimated Nutritional Needs: Kcal: 1750-1850 Protein: 75-90 Fluid: >/= 1.8L daily  Skin: intact  Diet Order: Diet Heart  EDUCATION NEEDS: -No education needs identified at this time   Intake/Output Summary (Last 24 hours) at 11/05/14 1551 Last data filed at 11/05/14 1315  Gross per 24 hour  Intake 3665.83 ml  Output   1575 ml  Net 2090.83 ml    Last BM: 2/24  Labs:   Recent Labs Lab 11/03/14 1720 11/04/14 0623 11/05/14 0545  NA 137 137 139  K 4.0 3.9 4.1  CL 107 108 106  CO2 23 23 21   BUN 21 25* 26*  CREATININE 1.34 1.13 1.02  CALCIUM 8.8 9.0 8.4  MG 1.1* 1.1*  --   PHOS 3.2  --   --  GLUCOSE 138* 143* 92    CBG (last 3)  No results for input(s): GLUCAP in the last 72 hours.  Scheduled Meds: . calcium carbonate  1 tablet Oral Daily  . febuxostat  40 mg Oral Daily  . ferrous sulfate  325 mg Oral Q breakfast  . HYDROmorphone  1 mg Oral Q12H  . lidocaine  20 mL Intradermal Once  . magnesium oxide  400 mg Oral BID  . methylPREDNISolone acetate  80 mg Intra-articular Once  . pantoprazole  40 mg Oral Daily  . polyethylene glycol  17 g Oral Daily  . predniSONE  50 mg Oral Q breakfast  . triamcinolone acetonide  80 mg Intra-articular  Once  . warfarin  2 mg Oral ONCE-1800  . Warfarin - Pharmacist Dosing Inpatient   Does not apply q1800    Continuous Infusions:   Past Medical History  Diagnosis Date  . Anemia   . Hypertension   . Gout   . Atrial fibrillation   . GERD (gastroesophageal reflux disease)   . Hyperlipidemia   . Herpes   . RHINITIS, ALLERGIC 11/08/2006  . Herpes genitalia 04/13/2011  . CAD (coronary artery disease)     [place]  . Tobacco abuse   . Alcohol abuse   . Cocaine abuse     Hx of  . BPH 11/08/2006  . Myocardial infarction 2005    Past Surgical History  Procedure Laterality Date  . Laceration repair Right 03/1988    "hand"  . Cardiac catheterization  08/2004    Wynona Dove, MS Dietetic Intern Pager: [phone]

## 2014-11-06 DIAGNOSIS — M109 Gout, unspecified: Principal | ICD-10-CM | POA: Insufficient documentation

## 2014-11-06 LAB — COMPREHENSIVE METABOLIC PANEL
ALK PHOS: 116 U/L (ref 39–117)
ALT: 102 U/L — ABNORMAL HIGH (ref 0–53)
AST: 43 U/L — AB (ref 0–37)
Albumin: 2 g/dL — ABNORMAL LOW (ref 3.5–5.2)
Anion gap: 7 (ref 5–15)
BILIRUBIN TOTAL: 0.5 mg/dL (ref 0.3–1.2)
BUN: 22 mg/dL (ref 6–23)
CHLORIDE: 104 mmol/L (ref 96–112)
CO2: 25 mmol/L (ref 19–32)
Calcium: 8.3 mg/dL — ABNORMAL LOW (ref 8.4–10.5)
Creatinine, Ser: 1.06 mg/dL (ref 0.50–1.35)
GFR calc Af Amer: 86 mL/min — ABNORMAL LOW (ref >90)
GFR, EST NON AFRICAN AMERICAN: 74 mL/min — AB (ref >90)
Glucose, Bld: 134 mg/dL — ABNORMAL HIGH (ref 70–99)
POTASSIUM: 3.8 mmol/L (ref 3.5–5.1)
Sodium: 136 mmol/L (ref 135–145)
Total Protein: 6.8 g/dL (ref 6.0–8.3)

## 2014-11-06 LAB — CBC
HCT: 25.1 % — ABNORMAL LOW (ref 39.0–52.0)
Hemoglobin: 8.6 g/dL — ABNORMAL LOW (ref 13.0–17.0)
MCH: 30.2 pg (ref 26.0–34.0)
MCHC: 34.3 g/dL (ref 30.0–36.0)
MCV: 88.1 fL (ref 78.0–100.0)
Platelets: 278 10*3/uL (ref 150–400)
RBC: 2.85 MIL/uL — AB (ref 4.22–5.81)
RDW: 14.1 % (ref 11.5–15.5)
WBC: 8.3 10*3/uL (ref 4.0–10.5)

## 2014-11-06 LAB — PROTIME-INR
INR: 2.23 — AB (ref 0.00–1.49)
Prothrombin Time: 24.9 seconds — ABNORMAL HIGH (ref 11.6–15.2)

## 2014-11-06 MED ORDER — FERROUS SULFATE 325 (65 FE) MG PO TABS: 325.0000 mg | ORAL_TABLET | Freq: Every day | ORAL | Status: DC

## 2014-11-06 MED ORDER — PREDNISONE 50 MG PO TABS
50.0000 mg | ORAL_TABLET | Freq: Every day | ORAL | Status: DC
Start: 2014-11-06 — End: 2014-11-16

## 2014-11-06 MED ORDER — OXYCODONE HCL 5 MG PO TABS: 5.0000 mg | ORAL_TABLET | Freq: Three times a day (TID) | ORAL | Status: DC | PRN

## 2014-11-06 MED ORDER — WARFARIN SODIUM 5 MG PO TABS
5.0000 mg | ORAL_TABLET | Freq: Once | ORAL | Status: AC
  Administered 2014-11-06: 5 mg via ORAL
  Filled 2014-11-06: qty 1

## 2014-11-06 MED ORDER — WHITE PETROLATUM GEL
Status: AC
  Filled 2014-11-06: qty 1

## 2014-11-06 NOTE — Procedures (Signed)
Informed consent obtained and placed in chart.  Time out performed.  Area cleaned with iodine x 3 and wiped clear with alcohol swab.  Using 18 gauge needle joint space was penetrated via the lateral approach and fluid subsequently withdrawn, approximately 70cc from left knee, and 30 cc from right knee withdrawn. With needle still intraarticular, 1 cc Kenalog and 3 cc's 1% Lidocaine were injected in the joint space bilaterally.  Sterile bandage placed.  Patient tolerated procedure well.  No complications.     Paula Compton, MD Family Medicine - PGY 1  Supervised by Attending - Dr. Wendy Poet

## 2014-11-06 NOTE — Clinical Social Work Psychosocial (Signed)
Clinical Social Work Department BRIEF PSYCHOSOCIAL ASSESSMENT 11/06/2014  Patient:  Jesse Franklin, Jesse Franklin     Account Number:  [id number]     Admit date:  11/03/2014  Clinical Social Worker:  Lovey Newcomer  Date/Time:  11/06/2014 03:17 PM  Referred by:  Physician  Date Referred:  11/06/2014 Referred for  SNF Placement   Other Referral:   NA   Interview type:  Patient Other interview type:   Patient alert and oriented at time of assessment.    PSYCHOSOCIAL DATA Living Status:  ALONE Admitted from facility:   Level of care:   Primary support name:  Jesse Franklin Primary support relationship to patient:  SIBLING Degree of support available:   Support is good.    CURRENT CONCERNS Current Concerns  Post-Acute Placement   Other Concerns:   NA    SOCIAL WORK ASSESSMENT / PLAN CSW met with patient at bedside to complete assessment. PT has recommended SNF placement for patient. At this time patient is refusing SNF placement as he believes he will be fine at home alone. CSW has reviewed PT evaluation and patient does seem to be doing well with PT. Patient states that he lives alone and is NOT willing to have friends or family stay with him at home as he likes to be alone.    CSW inquired about patient's understanding of his hospitalization. Patient appears to have good understanding about the reason he was admitted to the facility. Patient shares with CSW that he used to be very involved in the community prior to the decline of his health, and this seemed to sadden him. CSW offered emotional support to patient and explained that RNCM would be notified about home needs at discharge. Patient was very appreciative of CSW assistance.   Assessment/plan status:  Psychosocial Support/Ongoing Assessment of Needs Other assessment/ plan:   NONE   Information/referral to community resources:   CSW has notified RNCM of patient's [place] needs.    PATIENT'S/FAMILY'S RESPONSE TO PLAN OF  CARE: Patient plans to return home with [facility] services. Patient did not want to discuss SNF placement or need for supervision at home. CSW signing off at this time.       Liz Beach MSW, [place], [place], [phone]

## 2014-11-06 NOTE — Progress Notes (Signed)
Discussed discharge instructions with pt.  Pt given prescription and PIV removed.  Pt denied any other needs at this time.  Pt taken to discharge location via wheelchair.

## 2014-11-06 NOTE — Progress Notes (Signed)
Family Medicine Teaching Service Daily Progress Note Intern Pager: [phone]  Patient name: Jesse Franklin Medical record number: [id number] Date of birth: [birth year] Age: 64 y.o. Gender: male  Primary Care Provider: Chrisandra Netters, MD Consultants: none Code Status: Full (confirmed on admission)  Pt Overview and Major Events to Date:  2/23: Admitted from [facility] clinic with bilateral knee pain and swelling 2/25: Bilateral joint aspiration and solu-medrol/lidocaine injection  Assessment and Plan: Jesse Franklin is a 64 y.o. male presenting directly from [facility] with bilateral knee swelling and pain, also with R-wrist, R-great toe pain suspicious for subacute polyarticular gout flare. PMH is significant for CAD, AFib (anticoag coumadin), HTN, HLD, gout (L-knee, s/p drain 10/12/14), supra-therapeutic INR 10/2014, hypocalcemia, hypomagnesemia  # Polyarticular joint swelling and pain, likely subacute gout flare: Recent intermittent gout flare L-knee since 08/2014 with multiple office visits, s/p drain and steroid inj at [facility] on 10/12/14. Seen in office 10/27/14 and thought to have another flare.Rx'd prednisone (360m x 5 days) without improvement and dilaudid at that time. Low suspision for infectious etiology, unlikely septic arthritis (afebrile, multiple joints, WBC 5). No family hx of rheumatologic disorders. Imaging shows mild osteoarthritis (R >L), it is not the cause of this acute pain.  - Significantly improved today. - Uric 4.9, sed rate 138.  - Bilateral knees aspirated (70cc of yellow, thick fluid from left and 30 cc of yellow to red fluid from right) and injected with solu-medrol/lidocaine mixture.  - Monosodium urate crystals present in both knees. 8600 WBCs, 61% PMNS, 28% Monos - Standing bilateral XRs s/p aspiration showed mild degenerative changes. R knee with marginal spur formation in sharp in the tibial spines compatible with osteoarthritis.  - Resume Uloric (chosen due to coumadin  interaction with allopurinol). Will consider switching this medication as patient is having more flares and AST and ALT elevation since initiation.  - Pain regimen: Dilaudid 154mq12 hours sch / oxycodone 60m72m 4 PRN - Prednisone 91m57mily (2/23>>), patient is improving with less gout symptoms. Will continue 91mg74mly until patient follows up with outpatient provider next week. Patient has been on steroids almost continuously since 2/16. Pt may require taper if total po steroids duration is > 3 weeks. - Activity as tolerated - PT/OT to start when pain improves. [place] HH, [place]evaluate for SNF vs HH  # 17 lbs weight loss in the setting of elevated sed rate and possible bone pain: Patient is at risk of cancer given his age and smoking history. Pt had normal colonoscopy in 11/2013. Screening CXR with no acute process. PSA 2.33. Hep B non-reactive, Hep C negative.  - Continue to monitor  # Normocytic Anemia: Pt with stable, but low hgb 8.8>8.2>8.6. B/l 12-13. FOBT negative. Previous iron studies in 2011 consistent with IDA. Normal colonoscopy in 11/2013. Retic count 2.2, consistent with ACD. Given unclear etiology, we will treat for IDA anemia and monitor response - Will not repeat iron panel now as ferritin will likely be elevated in acute illness - Continue iron supplementation, monitor for 1 point inc in hgb/week over next few weeks and if no improvement, discontinue the supplementation  # Elevated Liver Enzymes: Elevated since admission, but down-trending. ALT>AST (c/w viral hep), but hep B non-reactive and hep C negative. Will avoid hepatotoxic medications, including home crestor.  - AST 102>56>43 - ALT 162>113>102 - Pt does not have indications for Hep B vaccine, consider in future.   # Hypomagnesemia: 1.1 this admission.  - Continue  home MagOx 400 BID  # Hypocalcemia: Recently low since 2/12 6-7, unclear if taking calcium PO at home after recent discharge. WNL this  admission. - PO replacement daily  # Elevated CK: 3950 on 2/16, CK 243 this admission. TSH and Phos WNL - UA with trace hgb, protein 30, few squamous epi, few bacteria, +granular casts - Continue to monitor  # Stage 2 sacral ulcer: Seems to have developed recently when patient was immobilized at home for 3 days. No sign of secondary infection.  - Wound care consulted who recommend sacral foam dressing to area to prevent further breakdown to be changed every 5 days.  # Suprathrapeutic INR (Coumadin anticoagulation for AFib): Patient seems to have theraputic INR historically. INR supratherapeutic in Nov. 2015 and again recently. Patient may be doubling up on his medications at home. No history of active bleeding. Followed by Cardiology for INR checks - INR 6.4>4.96>5.20>3.48>2.23 - Pharm consulted for coumadin dosing - Pharm educated patient on home coumadin regimen  # Atrial Fibrillation, chronic - Hold Coumadin - (ordered consult per pharm)  # H/o CAD: Stable no active CP - Hold rosuvastatin 83m daily, given elevated transaminases   # HTN: BPs low-normal - Hold Amlodipine and Altace for now.  - Will resume at discharge  # GERD - Continue PPI  # Moderate Malnutrition: Patient appears malnourished. He endorses poor appetite over the last few months. Albumin 2.1 (3.5 in clinic on 2/16) in the setting of recent immobilization at home.  - Nutrition consulted and provided Resource [place] BID  FEN/GI: Heart healthy Prophylaxis: Anticoagulated with coumadin, dosing managed by pharm  Disposition: PT/OT recommend SNF. [place]   Subjective:  Patient states his pain is substantially improved. He reports being able to walk with his walker and is much more comfortable. He states his right wrist pain is improved, but he is nervous his right big toe is getting worse. He is eager to go home and about having a [place] help him. He wants to make sure he is being  healthy as this pain was the worst he's had.   Objective: Temp:  [98.1 F (36.7 C)-99 F (37.2 C)] 98.1 F (36.7 C) (02/26 0628) Pulse Rate:  [83-92] 92 (02/26 0628) Resp:  [16-20] 16 (02/26 0628) BP: (109-134)/(74-76) 134/76 mmHg (02/26 0628) SpO2:  [99 %-100 %] 100 % (02/26 05732 Physical Exam: General: thin appearing elderly gentleman, appears comfortable, willing to move from lying to sitting without difficulty. Cooperative, NAD HEENT: NCAT, MMM, o/p clear, poor dentition Cardiovascular: irregularly irregular, normal S1, S2, no murmurs Respiratory: CTAB. No wheezing, crackles. Non-labored. Good air movement. Speaks full sentences Abdomen: soft, NT/ND, Extremities: Improved effusions at b/l knees, primarily suprapatellar, but substantially approved. No warmth appreciated over knees. Bandaids over site of aspiration intact with minimal blood saturation. Mild pain with palpation over left patella. R great toe MTP with enlarged tender nodule with erythema and moderate TTP. Tophus evident over left elbow. Prominent nail clubbing of all 10 digits MSK: Grossly intact, willing to get out of bed and ambulate without difficulty Skin: warm, dry; skin abrasion on R posterior thigh & R elbow. B/l stage 2 on sacral area was covered with intact dressing Neuro: awake, alert, oriented x3, grossly non-focal  Laboratory:  Recent Labs Lab 11/04/14 0623 11/05/14 0545 11/06/14 0520  WBC 5.7 7.8 8.3  HGB 8.8* 8.2* 8.6*  HCT 24.9* 24.2* 25.1*  PLT 230 242 278    Recent Labs  Lab 11/03/14 1720 11/04/14 0623 11/05/14 0545 11/06/14 0520  NA 137 137 139 136  K 4.0 3.9 4.1 3.8  CL 107 108 106 104  CO2 23 23 21 25   BUN 21 25* 26* 22  CREATININE 1.34 1.13 1.02 1.06  CALCIUM 8.8 9.0 8.4 8.3*  PROT 7.2  --  6.2 6.8  BILITOT 0.8  --  0.5 0.5  ALKPHOS 143*  --  120* 116  ALT 162*  --  113* 102*  AST 102*  --  56* 43*  GLUCOSE 138* 143* 92 134*   Results for Jesse Franklin, Jesse Franklin (MRN [id number]) as  of 11/06/2014 08:31  Ref. Range 11/05/2014 12:30 11/05/2014 12:34  Color, Synovial Latest Range: YELLOW  YELLOW YELLOW  Appearance-Synovial Latest Range: CLEAR  TURBID (A) TURBID (A)  Crystals, Fluid No range found INTRACELLULAR MON... INTRACELLULAR MON...  WBC, Synovial Latest Range: 0-200 /cu mm 5950 (H) 8600 (H)  Neutrophil, Synovial Latest Range: 0-25 % 72 (H) 61 (H)  Lymphocytes-Synovial Fld Latest Range: 0-20 % 2 11  Monocyte-Macrophage-Synovial Fluid Latest Range: 50-90 % 26 (L) 28 (L)  Eosinophils-Synovial Latest Range: 0-1 % 0 0    Sed rate - 138  TSH - 2.436  INR - 4.96 > 5.20> 3.48>2.23 PT - 46.5 > 49.3> 35.2>24.9  PSA - 2.33  Hep B - non reactive Hep C - negative   Imaging/Diagnostic Tests: 11/05/2014 Complete R Knee: IMPRESSION: 1. Joint effusion. Gas is identified within the effusion which may be related instrumentation or infection. 2. Osteoarthritis. These results will be called to the ordering clinician or representative by the Radiologist Assistant, and communication documented in the PACS or zVision Dashboard.  11/05/2014 Left Knee: IMPRESSION: No fracture or dislocation is seen. Mild degenerative changes. Large suprapatellar knee joint effusion.  11/05/2014 Bilateral standing AP: FINDINGS: No fracture or dislocation is seen. Mild degenerative changes, with sharpening of the tibial spines bilaterally, and medial compartment narrowing on the left and lateral compartment narrowing on the right. IMPRESSION: Mild degenerative changes.  11/04/2014 CXR: FINDINGS: Cardiac silhouette is mildly enlarged. No mediastinal or hilar masses or evidence of adenopathy. Clear lungs. No pleural effusion pneumothorax. Bony thorax is intact. IMPRESSION: No acute cardiopulmonary disease.  Tomasa Rand Dinizo, Med Student 11/06/2014, 8:22 AM [place] Intern pager: [phone], text pages welcome  I have examined and discussed the patient with  the medical student and FPTS/attending.     Resident Note: Briefly, Pt was admitted for a presumed gout flare in multiple joints. He was found down, after being on the floor for 3 days. Pt had a gout flare with aspiration/gout proven effusion 1.5 months ago with steroid injection. Pt states this is the worse gout flare he has every had and it is in his wrist as well, which he has never had prior. He has had an extended course of steroids this month for gout.Today he states the worse of his pain is in his knee (left>right). He also had left elbow tophi and right great toe pain. Pt has been on Uloric over allopurinol secondary to coumadin interaction with allopurinol. Secondary to being down for 3 days he has also suffered a stage 1 sacral Pressure ulcer. His INR was supratheraputic as well on this admisision. In speaking with the patient it is likely he was accidentally doubling up the dosing after a recent medication alteration. BP 134/76 mmHg  Pulse 92  Temp(Src) 98.1 F (36.7 C) (Oral)  Resp 16  Ht 5' 6"  (1.676  m)  Wt 154 lb 5.2 oz (70 kg)  BMI 24.92 kg/m2  SpO2 100%   General: thin appearing elderly gentleman, appears comfortable, cooperative, NAD, talkative. Pleasant. HEENT: NCAT, MMM, o/p clear, poor dentition Cardiovascular: irregularly irregular, normal S1, S2, no murmurs Respiratory: CTAB. No wheezing, crackles. Non-labored. Good air movement. Abdomen: soft, NT/ND, BS present.  Extremities: mild effusion remains at b/l knees. No erythema. L worse than R. Pain with palpation over left patella improved. Right wrist with inflamed/enlarged ulnar styloid process +TTP, R great toe MTP with enlarged tender nodule with erythema and moderate TTP. Tophus evident over left elbow.  MSK: ROM much improved, full flexion and extension Skin: warm, dry; skin abrasion on R posterior thigh & R elbow. B/l stage 2 on sacral area was covered with intact dressing Neuro: awake, alert, oriented x3,  grossly non-focal  Assessment and Plan:  # Polyarticular joint swelling and pain, likely subacute gout flare: Bilateral knee aspirations and injection of steroid completed yesterday with gout consistent results. Pt stated improvement on pain.  - F/U on aspiration studies  - DG knee studies (sunrise, standing A/P) >> mild arthritis.   - Prednisone 60m qd until symptoms resolved (2/23>>) will follow up outpatient >> could consider decreasing .  - HH orders placed  # Stage 2 sacral ulcer: Seems to have developed recently when patient was immobilized at home for 3 days. No sign of secondary infection. Wound care consulted who recommend sacral foam dressing to area to prevent further breakdown to be changed every 5 days. - nurse to visit for dreassing changes.   # Suprathrapeutic INR (Coumadin anticoagulation for AFib): Patient may be doubling up on his medications. No history of active bleeding. Followed by Cardiology for INR checks - INR monitoring per pharm and cards in outpt setting - Will attempt to get help in the home with medications.   # Atrial Fibrillation, chronic >> Coumadin per pharmacy  # H/o CAD: Stable no active CP >> holding statin secondary to elevated LFT  # HTN: BPs low-normal >> Hold Amlodipine and Altace for now.   # Malnutrition: Patient appears malnourished. Albumin 2.1 (3.5 in clinic on 2/16) in the setting of recent immbolizationat home.  - Consult nutrition   Discharge home today with [place]nurse and PT) [id number]

## 2014-11-06 NOTE — Progress Notes (Signed)
ANTICOAGULATION CONSULT NOTE - Follow Up Consult  Pharmacy Consult:  Coumadin Indication: atrial fibrillation  Allergies  Allergen Reactions  . Codeine Hives  . Ketorolac Tromethamine     REACTION: hives    Patient Measurements: Height: 5' 6"  (167.6 cm) Weight: 154 lb 5.2 oz (70 kg) IBW/kg (Calculated) : 63.8  Vital Signs: Temp: 98.1 F (36.7 C) (02/26 0628) Temp Source: Oral (02/26 0628) BP: 134/76 mmHg (02/26 0628) Pulse Rate: 92 (02/26 0628)  Labs:  Recent Labs  11/03/14 1720 11/04/14 0623 11/05/14 0545 11/06/14 0520  HGB 8.7* 8.8* 8.2* 8.6*  HCT 25.4* 24.9* 24.2* 25.1*  PLT 211 230 242 278  LABPROT 46.5* 49.3* 35.2* 24.9*  INR 4.96* 5.20* 3.48* 2.23*  CREATININE 1.34 1.13 1.02 1.06  CKTOTAL 243*  --   --   --     Estimated Creatinine Clearance: 66 mL/min (by C-G formula based on Cr of 1.06).  Assessment: 41 YOM with history of Afib presented with nausea, vomiting, diarrhea and abdominal pain.  He was on Coumadin PTA and there was confusion regarding his regimen and thus, he was taking more than the intended dose. INR is now therapeutic at 2.23 after multiple held doses. H/H and platelets are stable. No bleeding noted.   Goal of Therapy:  INR 2-3  Plan:  1. Coumadin 54m PO x 1 tonight 2. F/u AM INR  RSalome Arnt PharmD, BCPS Pager # [phone]/26/2016 10:18 AM

## 2014-11-06 NOTE — Discharge Instructions (Signed)
You were admitted to [facility] for knee and toe pain. This increased pain was related to your gout. While you were admitted we aspirated and injected steroids into your knees.  While you were hospitalized we made a couple changes to your pain medications.  For pain: You should start taking Oxycodone 28m tablet every 8 hours as needed for pain. You should STOP taking tramadol and dilaudid. You should also STOP taking your Crestor, Amlodipine, and Altrace until you follow-up with Dr. KParks Rangeron 11/11/2014. You should continue to take your Coumadin 532m(one tablet) everyday, except Wednesday when you take 7.20m56mone and a half tablets). You have an appointment on Monday, 11/09/2014 with the Anticoagulation Clinic to check your INR.  If you develop chest pain, chest tightness, shortness of breath, uncontrollable pain or are unable to walk you should contact your primary care provider or seek treatment at the emergency department immediately.

## 2014-11-06 NOTE — Care Management Note (Signed)
    Page 1 of 2   11/06/2014     5:30:53 PM CARE MANAGEMENT NOTE 11/06/2014  Patient:  Jesse Franklin, Jesse Franklin   Account Number:  [id number]  Date Initiated:  11/06/2014  Documentation initiated by:  Tomi Bamberger  Subjective/Objective Assessment:   dx gout, bil knee pain  admit- lives alone.     Action/Plan:   pt eval- rec hhpt   Anticipated DC Date:  11/06/2014   Anticipated DC Plan:  [place]  CM consult      PAC Choice  [place]   Choice offered to / List presented to:  C-1 Patient   DME arranged  [place]      DME agency  [place] arranged  HH-1 RN  [place].   Status of service:  Completed, signed off Medicare Important Message given?  YES (If response is "NO", the following Medicare IM given date fields will be blank) Date Medicare IM given:  11/06/2014 Medicare IM given by:  Tomi Bamberger Date Additional Medicare IM given:   Additional Medicare IM given by:    Discharge Disposition:  [place]  Per UR Regulation:  Reviewed for med. necessity/level of care/duration of stay  If discussed at [place] of Stay Meetings, dates discussed:    Comments:  11/06/14 [place] [phone] patient chose [facility] for [facility] , PT, referral made to [facility] [place] notified. Soc will begin 24-48 hrs post dc. [place] notified for bsc and rolling walker.

## 2014-11-07 ENCOUNTER — Other Ambulatory Visit: Payer: Self-pay | Admitting: Cardiovascular Disease

## 2014-11-07 NOTE — Progress Notes (Signed)
Patient ID: Jesse Franklin, male   DOB: 07/12/1951, 64 y.o.   MRN: [id number]  Encounter date: 11/03/14  HPI:  Pt presents for f/u to [facility]. He was recently discharged from the hospital with a suprapubic INR and AKI. He since followed up with Dr. Gerarda Fraction here at the [facility] and had labs redrawn, which were significant for mildly elevated LFT's and higher CK than previously had at hospital discharge. He also was treated at that visit for gout flare with colchicine and prednisone.  Pt reports that for the last 1-2 weeks he has been unable to walk due to the pain in his knees. Knees are swollen and very painful. He has felt chills but does not know about fever. Is having decreased PO intake. Has a sore overlying his sacrum from lying in the floor and unable to get up. He had to be brought into the [facility] in a wheelchair, and EMS had to help him get into his car this morning at his house.  ROS: See HPI.  [place]: hx gout, HLD, tobacco abuse, HTN, CAD, afib  PHYSICAL EXAM: BP 104/73 mmHg  Pulse 74  Temp(Src) 98.9 F (37.2 C) (Oral)  Ht 5' 6"  (1.676 m) Gen: NAD HEENT: NCAT, MMM Heart: irregularly irregular Lungs: CTAB Neuro: grossly nonfocal speech normal Ext: bilateral knees with very large effusions. Tender with any movement of knees. No redness or marked warmth on the skin. Back: stage 2 sacral ulcer present appx 3cm in diameter  ASSESSMENT/PLAN:  64 yo M with PMHx as mentioned above, presenting unkempt and unable to walk due to bilateral knee pain, likely secondary to continued gout flare. He is afebrile ad generally well appearing (despite his knee pain and ulcer) so I strongly doubt septic arthritis at this time. Pt has hx of tophaceous gout. He has recent lab abnormalities and has been unable to reliably follow up as an outpt due to the severity of his knee pain and inability to ambulate. He is unsafe to go home at this time. He will be admitted to the hospital for further evaluation and assessment.  Precepted with Dr. Erin Hearing who also examined patient and agrees with this plan.   FOLLOW UP: Admitting directly to hospital.  Delorse Limber. Ardelia Mems, [place]

## 2014-11-09 ENCOUNTER — Ambulatory Visit (INDEPENDENT_AMBULATORY_CARE_PROVIDER_SITE_OTHER): Payer: Medicare Other | Admitting: *Deleted

## 2014-11-09 DIAGNOSIS — I4891 Unspecified atrial fibrillation: Secondary | ICD-10-CM

## 2014-11-09 DIAGNOSIS — Z5181 Encounter for therapeutic drug level monitoring: Secondary | ICD-10-CM

## 2014-11-09 LAB — POCT INR: INR: 1.7

## 2014-11-11 ENCOUNTER — Ambulatory Visit (INDEPENDENT_AMBULATORY_CARE_PROVIDER_SITE_OTHER): Payer: Medicare Other | Admitting: Family Medicine

## 2014-11-11 VITALS — BP 103/71 | HR 71 | Temp 98.0°F | Ht 66.0 in | Wt 156.2 lb

## 2014-11-11 DIAGNOSIS — D509 Iron deficiency anemia, unspecified: Secondary | ICD-10-CM

## 2014-11-11 DIAGNOSIS — N179 Acute kidney failure, unspecified: Secondary | ICD-10-CM

## 2014-11-11 DIAGNOSIS — R748 Abnormal levels of other serum enzymes: Secondary | ICD-10-CM

## 2014-11-11 DIAGNOSIS — L89151 Pressure ulcer of sacral region, stage 1: Secondary | ICD-10-CM

## 2014-11-11 DIAGNOSIS — I10 Essential (primary) hypertension: Secondary | ICD-10-CM

## 2014-11-11 DIAGNOSIS — M109 Gout, unspecified: Secondary | ICD-10-CM

## 2014-11-11 DIAGNOSIS — R7401 Elevation of levels of liver transaminase levels: Secondary | ICD-10-CM

## 2014-11-11 DIAGNOSIS — R740 Nonspecific elevation of levels of transaminase and lactic acid dehydrogenase [LDH]: Secondary | ICD-10-CM | POA: Diagnosis not present

## 2014-11-11 DIAGNOSIS — M10069 Idiopathic gout, unspecified knee: Secondary | ICD-10-CM | POA: Diagnosis not present

## 2014-11-11 LAB — COMPREHENSIVE METABOLIC PANEL
ALBUMIN: 3.5 g/dL (ref 3.5–5.2)
ALT: 47 U/L (ref 0–53)
AST: 20 U/L (ref 0–37)
Alkaline Phosphatase: 128 U/L — ABNORMAL HIGH (ref 39–117)
BUN: 22 mg/dL (ref 6–23)
CALCIUM: 9.3 mg/dL (ref 8.4–10.5)
CO2: 26 mEq/L (ref 19–32)
Chloride: 102 mEq/L (ref 96–112)
Creat: 1.47 mg/dL — ABNORMAL HIGH (ref 0.50–1.35)
Glucose, Bld: 121 mg/dL — ABNORMAL HIGH (ref 70–99)
POTASSIUM: 5 meq/L (ref 3.5–5.3)
SODIUM: 138 meq/L (ref 135–145)
TOTAL PROTEIN: 6.9 g/dL (ref 6.0–8.3)
Total Bilirubin: 0.4 mg/dL (ref 0.2–1.2)

## 2014-11-11 LAB — CBC
HEMATOCRIT: 30.9 % — AB (ref 39.0–52.0)
HEMOGLOBIN: 10.1 g/dL — AB (ref 13.0–17.0)
MCH: 29.9 pg (ref 26.0–34.0)
MCHC: 32.7 g/dL (ref 30.0–36.0)
MCV: 91.4 fL (ref 78.0–100.0)
MPV: 9 fL (ref 8.6–12.4)
Platelets: 357 10*3/uL (ref 150–400)
RBC: 3.38 MIL/uL — AB (ref 4.22–5.81)
RDW: 15.3 % (ref 11.5–15.5)
WBC: 6.7 10*3/uL (ref 4.0–10.5)

## 2014-11-11 LAB — CULTURE, BODY FLUID-BOTTLE: Culture: NO GROWTH

## 2014-11-11 MED ORDER — OXYCODONE HCL 5 MG PO TABS: 5.0000 mg | ORAL_TABLET | Freq: Three times a day (TID) | ORAL | Status: DC | PRN

## 2014-11-11 MED ORDER — FERROUS SULFATE 325 (65 FE) MG PO TABS: 325.0000 mg | ORAL_TABLET | Freq: Every day | ORAL | Status: DC

## 2014-11-11 MED ORDER — COLCHICINE 0.6 MG PO TABS: 0.6000 mg | ORAL_TABLET | Freq: Two times a day (BID) | ORAL | Status: DC

## 2014-11-11 NOTE — Patient Instructions (Signed)
Dear Jesse Franklin, Thank you for coming in to clinic today. It was good to see you!  1. For your knee pain - continue to take Oxycodone as needed for pain (hope to get you off of this medicine over next 2 weeks or so) 2. Gout - take Colchicine 0.39m twice daily (sent new rx to pharmacy), continue Uloric 42mdaily 3. For BP - STOP Amlodipine and Ramipril 4. For Iron - Take Ferrous Sulfate 32525maily  Blood work today - will call with results  Some important numbers from today's visit: BP - 103/71 Results -  Please schedule a follow-up appointment with Dr. McIArdelia Memsthin 2 to 4 weeks to follow-up knees, discuss pain medicine.  If you have any other questions or concerns, please feel free to call the clinic to contact me. You may also schedule an earlier appointment if necessary.  However, if your symptoms get significantly worse, please go to the Emergency Department to seek immediate medical attention.  [name]

## 2014-11-11 NOTE — Progress Notes (Signed)
   Subjective:    Patient ID: Jesse Franklin, male    DOB: [birth year], 64 y.o.   MRN: [id number]  HPI  Hospital Follow-up: (admit 2/23, discharged 2/26)  GOUT FLARE B/L KNEES: - Hospitalized for polyarticular (bilateral knee) joint pain and swelling, presumed to be gout flare, confirmed gout with crystal analysis s/p b/l knee aspiration in hospital (ruled out infectious etiology), elevated ESR 138 and appropriate Uric Acid 4.9. Treated with prednisone, opiates. Significantly improved mobility after aspiration, initially considered SNF but pt declined and opted for [facility]. - Overall reports he has significantly improved since discharge. Today reports pain about 5/10 bilateral knee pain, taking Oxycodone IR 71m twice daily improving pain, about 2/10 at rest, otherwise worse with ambulation and up stairs up to 6/10. - No further swelling - Using cane for ambulation - Intermittent numbness knee down to ankles worse with ambulation up stairs, gradually resolves - Oxycodone has about 1-2 weeks left - Stated he has continued to take Uloric 416mdaily, and has not been taking Colchicine, just finished Prednisone burst  SUPRA THERAPEUTIC INR / CHRONIC ANTICOAGULATION / AFIB - During hospitalization had INR up to 6.4, held coumadin, pharmacy monitored INR - Last outpt INR 1.7 at coumadin apt with cards 2/29 - No bleeding or concerns   CHRONIC HTN: Reports - no concerns. Does not check BP at home. Current Meds - None - on discharged held Amlodipine 1064maily and Ramipril 36m50mD   Reports good compliance previously, but still remains unclear which name of each medicine is for what indication (knows meds only by pill color and size, and did not bring pills today). Tolerating well, w/o complaints. Denies CP, dyspnea, HA, edema, dizziness / lightheadedness  I have reviewed and updated the following as appropriate: allergies and current medications  Social Hx: active smoker  Review of Systems  See  above HPI    Objective:   Physical Exam  BP 103/71 mmHg  Pulse 71  Temp(Src) 98 F (36.7 C) (Oral)  Ht 5' 6"  (1.676 m)  Wt 156 lb 3.2 oz (70.852 kg)  BMI 25.22 kg/m2  Gen - well-appearing, NAD HEENT - MMM Neck - supple Heart - RRR, no murmurs heard MSK - Bilaterally Knees: appear mostly symmetrical with significantly improved effusions (s/p aspiration in hospital) without any appreciable effusion, no erythema, no warmth, mildly tender to palpation anterior knee, intact full active ROM knee flex / ext, intact str 5/5 flex / ext Ext - no edema, peripheral pulses intact +2 b/l Skin - Sacral / gluteal cleft superficial skin abrasion - healing well since last checked in hospital without further breakdown, no erythema or sign of infection, no open wound Neuro - awake, alert, oriented, grossly non-focal, intact distal sensation to light touch bilateral lower ext knee to feet distally, gait normal with cane for assistance    Assessment & Plan:   See specific A&P problem list for details.

## 2014-11-12 ENCOUNTER — Encounter: Payer: Self-pay | Admitting: Family Medicine

## 2014-11-12 DIAGNOSIS — L89151 Pressure ulcer of sacral region, stage 1: Secondary | ICD-10-CM | POA: Insufficient documentation

## 2014-11-12 DIAGNOSIS — R748 Abnormal levels of other serum enzymes: Secondary | ICD-10-CM | POA: Insufficient documentation

## 2014-11-12 NOTE — Assessment & Plan Note (Addendum)
Significant improvement in bilateral knee pain, erythema, and effusion s/p b/l knee aspiration and prednisone course (multiple recent courses, finished with 5 days prednisone 38m daily, last dose today). - Current exam is reassuring, no recurrent effusion, mild tenderness, significantly improved ambulation (uses cane) - Pain improved but persistent - Knee aspirate culture - No growth final  Plan: 1. Continue Uloric 414mdaily and restart Colchicine 0.52m22mID indefinitely for suppression (may titrate down to daily if no flares for period of time) 2. Check CMET for LFTs (stable and resolved, remains on Uloric) 3 Refilled short-term opiate rx Oxycodone IR 5mg68m20, 0 refills) to help with ambulation 4. Follow-up with PCP 2-4 weeks monitor progress of b/l knee pain, suspect continued improvement if acute gout flare

## 2014-11-12 NOTE — Assessment & Plan Note (Signed)
Elevated peak transaminases AST 100 and ALT 160 with minimal inc T.Bilil, overall gradual trending down during hospital stay. Considered to be multifactorial, possible related to Uloric or Crestor, however tolerated these previously. - Negative Hep B ag - Negative Hep C  Plan: 1. Re-check CMET today - improved / resolved LFTs with AST 20 / ALT 47 2. Continue Uloric and may resume Crestor

## 2014-11-12 NOTE — Assessment & Plan Note (Signed)
Improved today. Healing well without further skin breakdown (no open wound), no erythema or signs of infection.  Plan: 1. Given some cushioned bandages, continue topical vaseline PRN, re-positioning

## 2014-11-12 NOTE — Assessment & Plan Note (Signed)
Improved, appears mildly dry today  Plan: 1. Check CMET - Cr increased from 1.06 to 1.41, however chart review with fluctuating Cr levels 1.5 to 3 range. Suspect this is most likely near new baseline 2. Continue to HOLD ACEi (Ramipril 64m BID) at this time 3. No NSAIDs, can take Tylenol 4. Increase hydration 5. Consider repeat BMET in 2-4 weeks or as needed

## 2014-11-12 NOTE — Assessment & Plan Note (Signed)
Decreased Hgb during recent hospital stay (FOBT negative) no evidence of active bleed, also note supratherapeutic INR >5 (since resolved) - Last colonoscopy 11/2013 - prior iron studies consistent with normocytic iron deficiency anemia  Plan: 1. Continue ferrous sulfate 336m PO daily 2. Check CBC today - results with Hgb 10.1 (improved - good results to iron supplement)

## 2014-11-12 NOTE — Assessment & Plan Note (Signed)
Stable low normal BP (off of all anti-HTN meds reportedly - held on DC)  Plan: 1.Discontinued Amlodipine 19m daily and Ramipril 140mBID at this time (to avoid any med confusion - to remove them from his list) 2. Repeat BP 2-4 weeks at next follow-up, consider resuming low dose ACEi if Cr stable and if BP elevated

## 2014-11-13 ENCOUNTER — Telehealth: Payer: Self-pay | Admitting: Family Medicine

## 2014-11-13 NOTE — Telephone Encounter (Signed)
Dorrie is the insurance rep for the patient is calling to get some information about the patients medication as to why he is taking them jw

## 2014-11-16 ENCOUNTER — Ambulatory Visit (INDEPENDENT_AMBULATORY_CARE_PROVIDER_SITE_OTHER): Payer: Medicare Other | Admitting: Pharmacist

## 2014-11-16 DIAGNOSIS — I4891 Unspecified atrial fibrillation: Secondary | ICD-10-CM | POA: Diagnosis not present

## 2014-11-16 DIAGNOSIS — Z5181 Encounter for therapeutic drug level monitoring: Secondary | ICD-10-CM

## 2014-11-16 LAB — POCT INR: INR: 1.9

## 2014-11-16 NOTE — Telephone Encounter (Signed)
Please call this person and ask what they specifically need.  Jesse Rio, MD

## 2014-11-23 ENCOUNTER — Other Ambulatory Visit: Payer: Self-pay | Admitting: *Deleted

## 2014-11-26 ENCOUNTER — Other Ambulatory Visit: Payer: Self-pay | Admitting: Family Medicine

## 2014-11-26 MED ORDER — MAGNESIUM OXIDE 400 (241.3 MG) MG PO TABS: 400.0000 mg | ORAL_TABLET | Freq: Two times a day (BID) | ORAL | Status: DC

## 2014-11-26 MED ORDER — CALCIUM CARBONATE ANTACID 500 MG PO CHEW: 1.0000 | CHEWABLE_TABLET | Freq: Every day | ORAL | Status: DC

## 2014-11-30 ENCOUNTER — Ambulatory Visit (INDEPENDENT_AMBULATORY_CARE_PROVIDER_SITE_OTHER): Payer: Medicare Other

## 2014-11-30 DIAGNOSIS — I4891 Unspecified atrial fibrillation: Secondary | ICD-10-CM

## 2014-11-30 DIAGNOSIS — Z5181 Encounter for therapeutic drug level monitoring: Secondary | ICD-10-CM

## 2014-11-30 LAB — POCT INR: INR: 2

## 2014-12-02 ENCOUNTER — Ambulatory Visit (INDEPENDENT_AMBULATORY_CARE_PROVIDER_SITE_OTHER): Payer: Medicare Other | Admitting: Family Medicine

## 2014-12-02 ENCOUNTER — Encounter: Payer: Self-pay | Admitting: Family Medicine

## 2014-12-02 VITALS — BP 125/80 | HR 56 | Temp 97.6°F | Ht 66.0 in | Wt 158.9 lb

## 2014-12-02 DIAGNOSIS — N179 Acute kidney failure, unspecified: Secondary | ICD-10-CM | POA: Diagnosis not present

## 2014-12-02 DIAGNOSIS — M109 Gout, unspecified: Secondary | ICD-10-CM

## 2014-12-02 DIAGNOSIS — M10069 Idiopathic gout, unspecified knee: Secondary | ICD-10-CM | POA: Diagnosis not present

## 2014-12-02 LAB — BASIC METABOLIC PANEL
BUN: 10 mg/dL (ref 6–23)
CALCIUM: 9.1 mg/dL (ref 8.4–10.5)
CO2: 23 mEq/L (ref 19–32)
Chloride: 108 mEq/L (ref 96–112)
Creat: 1.1 mg/dL (ref 0.50–1.35)
Glucose, Bld: 42 mg/dL — CL (ref 70–99)
POTASSIUM: 4.1 meq/L (ref 3.5–5.3)
Sodium: 144 mEq/L (ref 135–145)

## 2014-12-02 LAB — MAGNESIUM: MAGNESIUM: 1.5 mg/dL (ref 1.5–2.5)

## 2014-12-02 NOTE — Patient Instructions (Signed)
Stay on current medicines We're checking bloodwork Follow up with me in 2 months Come back sooner if that pain recurs.

## 2014-12-03 ENCOUNTER — Telehealth: Payer: Self-pay | Admitting: *Deleted

## 2014-12-03 NOTE — Telephone Encounter (Signed)
Received call this morning from [facility] with critical value on Jesse Franklin. His glucose was 42. Called and reported to Dr Ardelia Mems at 9 am this morning.[place], Kevin Fenton

## 2014-12-03 NOTE — Progress Notes (Signed)
Patient ID: Jesse Franklin, male   DOB: 1951/04/22, 64 y.o.   MRN: [id number]  HPI:  F/u gout: currently taking uloric daily as well as colchicine BID. Tolerating these medicines well. Gout flare has ended. Still has mild swelling in bilat knees but pain much improved  Pt reports he had occasional suprapubic discomfort. Not having this currently. No dysuria or fever. Has not noticed any hernias or bulges in his genital area. Thinks he might have pulled a muscle causing the pain.  ROS: See HPI.  [place]: hx HLD, gout, tobacco abuse, htn, cad, afib  PHYSICAL EXAM: BP 125/80 mmHg  Pulse 56  Temp(Src) 97.6 F (36.4 C) (Oral)  Ht 5' 6"  (1.676 m)  Wt 158 lb 14.4 oz (72.077 kg)  BMI 25.66 kg/m2 Gen: NAD HEENT: NCAT Heart: irregularly irreg, no murmur Lungs: CTAB NWOB Neuro: grossly nonfocal speech normal Abd: soft NTTP.  GU: no palpable abnormality to penis or testicles. No inguinal bulges with valsava. Ext: mild to moderate effusion bilat knees. Crepitus with active extension. No warmth or erythema to knees. nontender to palpation  ASSESSMENT/PLAN:  Gout Stable on colchicine BID and uloric. Continue current regimen.   AKI (acute kidney injury) Recheck renal fxn today, also recheck magnesium given previously low mag levels while hospitalized.    Suprapubic discomfort - seems was self limited, related to muscular discomfort. No hernias or abnormalities on genital exam today. Defer any workup for this today since it has resolved. Instructed pt to f/u if pain recurs.  FOLLOW UP: F/u in 2 months for chronic medical problems.  [place]. Ardelia Mems, [place]

## 2014-12-05 NOTE — Assessment & Plan Note (Signed)
Stable on colchicine BID and uloric. Continue current regimen.

## 2014-12-05 NOTE — Assessment & Plan Note (Signed)
Recheck renal fxn today, also recheck magnesium given previously low mag levels while hospitalized.

## 2014-12-21 ENCOUNTER — Ambulatory Visit (INDEPENDENT_AMBULATORY_CARE_PROVIDER_SITE_OTHER): Payer: Medicare Other | Admitting: *Deleted

## 2014-12-21 DIAGNOSIS — Z5181 Encounter for therapeutic drug level monitoring: Secondary | ICD-10-CM | POA: Diagnosis not present

## 2014-12-21 DIAGNOSIS — I4891 Unspecified atrial fibrillation: Secondary | ICD-10-CM

## 2014-12-21 LAB — POCT INR: INR: 1.7

## 2014-12-26 ENCOUNTER — Other Ambulatory Visit: Payer: Self-pay | Admitting: Family Medicine

## 2014-12-29 ENCOUNTER — Other Ambulatory Visit: Payer: Self-pay | Admitting: Family Medicine

## 2015-01-02 ENCOUNTER — Other Ambulatory Visit: Payer: Self-pay | Admitting: Family Medicine

## 2015-01-04 ENCOUNTER — Ambulatory Visit (INDEPENDENT_AMBULATORY_CARE_PROVIDER_SITE_OTHER): Payer: Medicare Other | Admitting: *Deleted

## 2015-01-04 ENCOUNTER — Other Ambulatory Visit: Payer: Self-pay | Admitting: Family Medicine

## 2015-01-04 DIAGNOSIS — Z5181 Encounter for therapeutic drug level monitoring: Secondary | ICD-10-CM | POA: Diagnosis not present

## 2015-01-04 DIAGNOSIS — I4891 Unspecified atrial fibrillation: Secondary | ICD-10-CM | POA: Diagnosis not present

## 2015-01-04 LAB — POCT INR: INR: 2

## 2015-01-11 ENCOUNTER — Other Ambulatory Visit: Payer: Self-pay | Admitting: Family Medicine

## 2015-01-11 MED ORDER — PANTOPRAZOLE SODIUM 40 MG PO TBEC: 40.0000 mg | DELAYED_RELEASE_TABLET | Freq: Every day | ORAL | Status: DC

## 2015-01-11 NOTE — Telephone Encounter (Signed)
Switching him to Protonix due to possible decreased serum concentration of Plavix from interaction with nexium.  Please let me know if he cannot afford.   Tamela Oddi Gordan Grell, DO of [facility] 01/11/2015, 9:00 AM

## 2015-01-14 ENCOUNTER — Encounter: Payer: Self-pay | Admitting: Cardiovascular Disease

## 2015-01-14 ENCOUNTER — Ambulatory Visit (INDEPENDENT_AMBULATORY_CARE_PROVIDER_SITE_OTHER): Payer: Medicare Other | Admitting: Cardiovascular Disease

## 2015-01-14 VITALS — BP 140/86 | HR 59 | Ht 66.0 in | Wt 157.4 lb

## 2015-01-14 DIAGNOSIS — I208 Other forms of angina pectoris: Secondary | ICD-10-CM

## 2015-01-14 DIAGNOSIS — I10 Essential (primary) hypertension: Secondary | ICD-10-CM | POA: Diagnosis not present

## 2015-01-14 DIAGNOSIS — I4891 Unspecified atrial fibrillation: Secondary | ICD-10-CM | POA: Diagnosis not present

## 2015-01-14 DIAGNOSIS — I2089 Other forms of angina pectoris: Secondary | ICD-10-CM

## 2015-01-14 NOTE — Progress Notes (Signed)
Cardiology Office Note   Date:  01/14/2015   ID:  Jesse Franklin, DOB December 17, 1950, MRN [id number]  PCP:  Chrisandra Netters, MD  Cardiologist:   Thayer Headings, MD   Chief Complaint  Patient presents with  . Atrial Fibrillation   1. Atrial fibrillation 2. Coronary artery disease RESULTS: 08/30/04 1. Left main: Normal.  2. LAD: Large vessel, tortuous in the mid and distal section with mild  luminal irregularities. The extreme distal portion of the LAD after the  trifurcation of the distal vessel has a subtotal occlusion at the  trifurcation tip. Vessel less than 0.5 mm at this juncture.  3. LXC: Codominant with mild luminal irregularities.  4. OM1 and OM2: Large vessels with mild luminal irregularities.  5. RCA: Tortuous, codominant with mild luminal irregularities, very faint  right to left filling collaterals seen at the apex.  6. LV: EF 50-55% with mild inferior apical hypokinesis. LVEDP was 35  mmHg.  7. Abdominal/distal aortogram showed mild tortuosity. No significant  aneurysms were seen. The distal aorta showed mild splaying to the left  at the area of the iliac bifurcation. No significant iliac or femoral  disease was noted.  3. Hypertension 4. 6. Moderate pulmonary hypertension by echo in 2010 ( ongoing cigarette smoking)  5. Hyperlipidemia   History of Present Illness:  Jesse Franklin is a 64 yo with hx as noted above. He has occasional episodes of left arm tingling. These episodes last 1-2 seconds. They're not necessarily associated with exertion. The tingling seems to get better with arm movement. He also has occasional episodes of shortness of breath.  He walks on a regular basis. He walks approximately 1 mile a day. He still eats some salt and also eats fried and fast foods.  December 02, 2013:  Jesse Franklin is doing well. He denies any chest pain or shortness of breath. He walks about 15-20 minutes each day. He no longer works. He has cut down on his salt  usage.    Jesse Franklin 5, 2016:   Jesse Franklin is a 64 y.o. male who presents for follow-up of his atrial fibrillation. He also has a history of hypertension or hypertension. Still smoking  -    Past Medical History  Diagnosis Date  . Anemia   . Hypertension   . Gout   . Atrial fibrillation   . GERD (gastroesophageal reflux disease)   . Hyperlipidemia   . Herpes   . RHINITIS, ALLERGIC 11/08/2006  . Herpes genitalia 04/13/2011  . CAD (coronary artery disease)     [place]  . Tobacco abuse   . Alcohol abuse   . Cocaine abuse     Hx of  . BPH 11/08/2006  . Myocardial infarction 2005    Past Surgical History  Procedure Laterality Date  . Laceration repair Right 03/1988    "hand"  . Cardiac catheterization  08/2004     Current Outpatient Prescriptions  Medication Sig Dispense Refill  . amLODipine (NORVASC) 10 MG tablet Take 10 mg by mouth daily.    . calcium carbonate (TUMS) 500 MG chewable tablet Chew 1 tablet (200 mg of elemental calcium total) by mouth daily. 30 tablet 0  . clopidogrel (PLAVIX) 75 MG tablet Take 75 mg by mouth daily.  5  . colchicine 0.6 MG tablet Take 1 tablet (0.6 mg total) by mouth 2 (two) times daily. 60 tablet 2  . CRESTOR 40 MG tablet Take 40 mg by mouth daily.  3  . ferrous  sulfate 325 (65 FE) MG tablet Take 1 tablet (325 mg total) by mouth daily with breakfast. 30 tablet 5  . KLOR-CON M20 20 MEQ tablet TAKE 1 TABLET BY MOUTH EVERY DAY 30 tablet 3  . magnesium oxide (MAG-OX) 400 (241.3 MG) MG tablet TAKE 1 TABLET (400 MG TOTAL) BY MOUTH 2 (TWO) TIMES DAILY. 60 tablet 2  . NEXIUM 40 MG capsule TAKE ONE CAPSULE BY MOUTH EVERY DAY 90 capsule 0  . nitroGLYCERIN (NITROSTAT) 0.4 MG SL tablet Place 1 tablet (0.4 mg total) under the tongue as directed. Place 1 tab under tongue as directed 25 tablet 6  . oxyCODONE (OXY IR/ROXICODONE) 5 MG immediate release tablet Take 1 tablet (5 mg total) by mouth every 8 (eight) hours as needed for breakthrough pain. 20 tablet  0  . pantoprazole (PROTONIX) 40 MG tablet Take 1 tablet (40 mg total) by mouth daily. 30 tablet 3  . ULORIC 40 MG tablet TAKE 1 TABLET EVERY DAY 30 tablet 2  . warfarin (COUMADIN) 5 MG tablet TAKE AS DIRECTED BY ANTICOAGULATION CLINIC 40 tablet 2   No current facility-administered medications for this visit.   Facility-Administered Medications Ordered in Other Visits  Medication Dose Route Frequency Provider Last Rate Last Dose  . 0.9 %  sodium chloride infusion   Intravenous Continuous Willeen Niece, MD 125 mL/hr at 07/31/14 1200      Allergies:   Codeine and Ketorolac tromethamine    Social History:  The patient  reports that he has been smoking Cigarettes.  He has a 4.8 pack-year smoking history. He quit smokeless tobacco use about 47 years ago. His smokeless tobacco use included Chew. He reports that he does not drink alcohol or use illicit drugs.   Family History:  The patient's family history includes Alcohol abuse in his brother and father; Cancer in his sister; Gout in his mother; Heart disease in his brother; Hypertension in his mother.    ROS:  Please see the history of present illness.    Review of Systems: Constitutional:  denies fever, chills, diaphoresis, appetite change and fatigue.  HEENT: denies photophobia, eye pain, redness, hearing loss, ear pain, congestion, sore throat, rhinorrhea, sneezing, neck pain, neck stiffness and tinnitus.  Respiratory: denies SOB, DOE, cough, chest tightness, and wheezing.  Cardiovascular: denies chest pain, palpitations and leg swelling.  Gastrointestinal: denies nausea, vomiting, abdominal pain, diarrhea, constipation, blood in stool.  Genitourinary: denies dysuria, urgency, frequency, hematuria, flank pain and difficulty urinating.  Musculoskeletal: denies  myalgias, back pain, joint swelling, arthralgias and gait problem.   Skin: denies pallor, rash and wound.  Neurological: denies dizziness, seizures, syncope, weakness,  light-headedness, numbness and headaches.   Hematological: denies adenopathy, easy bruising, personal or family bleeding history.  Psychiatric/ Behavioral: denies suicidal ideation, mood changes, confusion, nervousness, sleep disturbance and agitation.       All other systems are reviewed and negative.    PHYSICAL EXAM: VS:  BP 140/86 mmHg  Pulse 59  Ht 5' 6"  (1.676 m)  Wt 157 lb 6.4 oz (71.396 kg)  BMI 25.42 kg/m2 , BMI Body mass index is 25.42 kg/(m^2). GEN: Well nourished, well developed, in no acute distress HEENT: normal Neck: no JVD, carotid bruits, or masses Cardiac: Irreg. Irreg. ;  Soft systolic murmur and soft diastolic murmur , no  rubs, or gallops,no edema  Respiratory:  clear to auscultation bilaterally, normal work of breathing GI: soft, nontender, nondistended, + BS MS: no deformity or atrophy Skin: warm and dry, no  rash Neuro:  Strength and sensation are intact Psych: normal   EKG:  EKG is ordered today. The ekg ordered today demonstrates :  Atrial fib at rate of 59.  TWI in the lateral leads.   No changes from previous tracing    Recent Labs: 11/03/2014: TSH 2.436 11/11/2014: ALT 47; Hemoglobin 10.1*; Platelets 357 12/02/2014: BUN 10; Creatinine 1.10; Magnesium 1.5; Potassium 4.1; Sodium 144    Lipid Panel    Component Value Date/Time   CHOL 129 07/09/2014 1025   TRIG 105 07/09/2014 1025   HDL 34* 07/09/2014 1025   CHOLHDL 3.8 07/09/2014 1025   VLDL 21 07/09/2014 1025   LDLCALC 74 07/09/2014 1025   LDLDIRECT 95 05/28/2012 0925      Wt Readings from Last 3 Encounters:  01/14/15 157 lb 6.4 oz (71.396 kg)  12/02/14 158 lb 14.4 oz (72.077 kg)  11/11/14 156 lb 3.2 oz (70.852 kg)      Other studies Reviewed: Additional studies/ records that were reviewed today include: . Review of the above records demonstrates:    ASSESSMENT AND PLAN:  1. Atrial fibrillation 2. Coronary artery disease RESULTS: 08/30/04 1. Left main: Normal.  2. LAD: Large  vessel, tortuous in the mid and distal section with mild  luminal irregularities. The extreme distal portion of the LAD after the  trifurcation of the distal vessel has a subtotal occlusion at the  trifurcation tip. Vessel less than 0.5 mm at this juncture.  3. LXC: Codominant with mild luminal irregularities.  4. OM1 and OM2: Large vessels with mild luminal irregularities.  5. RCA: Tortuous, codominant with mild luminal irregularities, very faint  right to left filling collaterals seen at the apex.  6. LV: EF 50-55% with mild inferior apical hypokinesis. LVEDP was 35  mmHg.  7. Abdominal/distal aortogram showed mild tortuosity. No significant  aneurysms were seen. The distal aorta showed mild splaying to the left  at the area of the iliac bifurcation. No significant iliac or femoral  disease was noted.  3. Hypertension - BP is ok   4.  Moderate pulmonary hypertension by echo in 2010 ( ongoing cigarette smoking) - encouraged him to stop 5. Hyperlipidemia    Current medicines are reviewed at length with the patient today.  The patient does not have concerns regarding medicines.  The following changes have been made:  no change  Labs/ tests ordered today include:  No orders of the defined types were placed in this encounter.     Disposition:   FU with me in 6 months      Nahser, Wonda Cheng, MD  01/14/2015 9:57 AM    [facility] [place], [place], [place]  [address] Phone: [phone]; Fax: [phone]   [facility]  [address] [place], [place]  [address] [phone]    Fax [phone]

## 2015-01-14 NOTE — Patient Instructions (Signed)
Medication Instructions:  Your physician recommends that you continue on your current medications as directed. Please refer to the Current Medication list given to you today.   Labwork: None  Testing/Procedures: None   Follow-Up: Your physician wants you to follow-up in: 6 months with Dr. Acie Fredrickson.  You will receive a reminder letter in the mail two months in advance. If you don't receive a letter, please call our office to schedule the follow-up appointment.

## 2015-01-25 ENCOUNTER — Ambulatory Visit (INDEPENDENT_AMBULATORY_CARE_PROVIDER_SITE_OTHER): Payer: Medicare Other

## 2015-01-25 DIAGNOSIS — Z5181 Encounter for therapeutic drug level monitoring: Secondary | ICD-10-CM

## 2015-01-25 DIAGNOSIS — I4891 Unspecified atrial fibrillation: Secondary | ICD-10-CM | POA: Diagnosis not present

## 2015-01-25 LAB — POCT INR: INR: 1.7

## 2015-01-26 ENCOUNTER — Other Ambulatory Visit: Payer: Self-pay | Admitting: Family Medicine

## 2015-02-11 ENCOUNTER — Encounter: Payer: Self-pay | Admitting: Family Medicine

## 2015-02-11 ENCOUNTER — Ambulatory Visit (INDEPENDENT_AMBULATORY_CARE_PROVIDER_SITE_OTHER): Payer: Medicare Other | Admitting: Family Medicine

## 2015-02-11 VITALS — BP 156/88 | HR 49 | Temp 97.8°F | Ht 66.0 in | Wt 157.0 lb

## 2015-02-11 DIAGNOSIS — M109 Gout, unspecified: Secondary | ICD-10-CM

## 2015-02-11 DIAGNOSIS — I10 Essential (primary) hypertension: Secondary | ICD-10-CM | POA: Diagnosis not present

## 2015-02-11 DIAGNOSIS — D509 Iron deficiency anemia, unspecified: Secondary | ICD-10-CM | POA: Diagnosis not present

## 2015-02-11 DIAGNOSIS — K219 Gastro-esophageal reflux disease without esophagitis: Secondary | ICD-10-CM | POA: Diagnosis not present

## 2015-02-11 DIAGNOSIS — M10069 Idiopathic gout, unspecified knee: Secondary | ICD-10-CM

## 2015-02-11 LAB — CBC
HCT: 36.6 % — ABNORMAL LOW (ref 39.0–52.0)
Hemoglobin: 12.1 g/dL — ABNORMAL LOW (ref 13.0–17.0)
MCH: 30.5 pg (ref 26.0–34.0)
MCHC: 33.1 g/dL (ref 30.0–36.0)
MCV: 92.2 fL (ref 78.0–100.0)
MPV: 9.9 fL (ref 8.6–12.4)
Platelets: 168 10*3/uL (ref 150–400)
RBC: 3.97 MIL/uL — AB (ref 4.22–5.81)
RDW: 15.2 % (ref 11.5–15.5)
WBC: 3.1 10*3/uL — ABNORMAL LOW (ref 4.0–10.5)

## 2015-02-11 LAB — MAGNESIUM: MAGNESIUM: 1.7 mg/dL (ref 1.5–2.5)

## 2015-02-11 LAB — COMPREHENSIVE METABOLIC PANEL
ALT: 10 U/L (ref 0–53)
AST: 18 U/L (ref 0–37)
Albumin: 4.1 g/dL (ref 3.5–5.2)
Alkaline Phosphatase: 76 U/L (ref 39–117)
BUN: 8 mg/dL (ref 6–23)
CALCIUM: 9.3 mg/dL (ref 8.4–10.5)
CO2: 23 meq/L (ref 19–32)
Chloride: 111 mEq/L (ref 96–112)
Creat: 1.17 mg/dL (ref 0.50–1.35)
Glucose, Bld: 88 mg/dL (ref 70–99)
Potassium: 3.9 mEq/L (ref 3.5–5.3)
SODIUM: 143 meq/L (ref 135–145)
Total Bilirubin: 0.6 mg/dL (ref 0.2–1.2)
Total Protein: 7 g/dL (ref 6.0–8.3)

## 2015-02-11 LAB — IRON AND TIBC
%SAT: 34 % (ref 20–55)
Iron: 92 ug/dL (ref 42–165)
TIBC: 271 ug/dL (ref 215–435)
UIBC: 179 ug/dL (ref 125–400)

## 2015-02-11 MED ORDER — AMLODIPINE BESYLATE 10 MG PO TABS: 10.0000 mg | ORAL_TABLET | Freq: Every day | ORAL | Status: DC

## 2015-02-11 NOTE — Progress Notes (Signed)
Patient ID: Jesse Franklin, male   DOB: [birth year], 64 y.o.   MRN: [id number]  HPI:  Hypertension: Has not been taking amlodipine as this was discontinued during a previous hospitalization when he had low blood pressures. He is doing well and feeling well. Denies shortness of breath. Has occasional mild chest pain which is not concerning to patient, follows up regularly with cardiology for his coronary artery disease and has been told everything is doing well.  Gout: Has not had any further gout flares. Taking uloric and colchicine. He quit drinking alcohol to avoid further gout flares.  GERD: Taking esomeprazole every day. He was confused as the type of his pill changed but according to the bottle, it is actually just the generic of Nexium. Well-controlled. He has been taking magnesium and potassium supplements daily. He would like to stop these if possible.  Anemia: Has been taking ferrous sulfate 325 mg once a day.  ROS: See HPI.  [place]: History of iron deficiency anemia, atrial fibrillation on Coumadin, CAD, depression, GERD, gout, hyperlipidemia  PHYSICAL EXAM: BP 156/88 mmHg  Pulse 49  Temp(Src) 97.8 F (36.6 C) (Oral)  Ht 5' 6"  (1.676 m)  Wt 157 lb (71.215 kg)  BMI 25.35 kg/m2 Gen: No acute distress, pleasant, cooperative HEENT: Normocephalic, atraumatic Heart: Irregularly irregular, no murmur, normal rate Lungs: Clear to auscultation bilaterally, normal respiratory effort Neuro: Grossly nonfocal, speech normal Ext: No lower extremity edema bilaterally, knees without effusions  ASSESSMENT/PLAN:  Anemia, iron deficiency Recheck iron studies and CBC today.   GASTROESOPHAGEAL REFLUX, NO ESOPHAGITIS Well-controlled, continue generic Nexium. Offered reassurance to patient that this is the same medication. I will recheck his magnesium and potassium levels today to see if we can stop his supplementation.   HYPERTENSION, BENIGN ESSENTIAL Blood pressure elevated, no longer on  amlodipine. We'll have him resume this. He will return on Monday for a blood pressure recheck by her nurse. If his blood pressure is well-controlled at that time, he can follow-up with me in 3 months.   Gout Now stable, no further flares recently. Continue colchicine and uloric. Check renal function and LFTs today.    FOLLOW UP: F/u  for on Monday for RN BP check Likely follow-up in office 3 months after that.  [place]. Ardelia Mems, [place]

## 2015-02-11 NOTE — Patient Instructions (Signed)
Checking labs today Restart amlodipine 68m daily Return for nurse BP check on Monday If BP good then, can follow up in 3 months  Be well, Dr. MArdelia Mems

## 2015-02-12 ENCOUNTER — Telehealth: Payer: Self-pay | Admitting: Family Medicine

## 2015-02-12 DIAGNOSIS — E876 Hypokalemia: Secondary | ICD-10-CM

## 2015-02-12 DIAGNOSIS — D72819 Decreased white blood cell count, unspecified: Secondary | ICD-10-CM

## 2015-02-12 LAB — FERRITIN: Ferritin: 129 ng/mL (ref 22–322)

## 2015-02-12 NOTE — Assessment & Plan Note (Signed)
Blood pressure elevated, no longer on amlodipine. We'll have him resume this. He will return on Monday for a blood pressure recheck by her nurse. If his blood pressure is well-controlled at that time, he can follow-up with me in 3 months.

## 2015-02-12 NOTE — Assessment & Plan Note (Signed)
Recheck iron studies and CBC today.

## 2015-02-12 NOTE — Assessment & Plan Note (Signed)
Well-controlled, continue generic Nexium. Offered reassurance to patient that this is the same medication. I will recheck his magnesium and potassium levels today to see if we can stop his supplementation.

## 2015-02-12 NOTE — Telephone Encounter (Signed)
Attempted x 2 to reach patient by phone but could not get through. I am on vacation next week. Red team, can you try patient again on Monday and let him know that:  1. His white blood cell count was a little low. Recommend we repeat this lab in about 3 weeks.   2. He can stop the potassium and magnesium supplements. We can recheck these when he comes in for that lab appointment in 3 weeks. Please instruct him to schedule lab visit.  Leeanne Rio, MD

## 2015-02-12 NOTE — Assessment & Plan Note (Signed)
Now stable, no further flares recently. Continue colchicine and uloric. Check renal function and LFTs today.

## 2015-02-15 ENCOUNTER — Ambulatory Visit (INDEPENDENT_AMBULATORY_CARE_PROVIDER_SITE_OTHER): Payer: Medicare Other | Admitting: *Deleted

## 2015-02-15 VITALS — BP 122/80 | HR 68

## 2015-02-15 DIAGNOSIS — Z5181 Encounter for therapeutic drug level monitoring: Secondary | ICD-10-CM

## 2015-02-15 DIAGNOSIS — I4891 Unspecified atrial fibrillation: Secondary | ICD-10-CM | POA: Diagnosis not present

## 2015-02-15 DIAGNOSIS — Z136 Encounter for screening for cardiovascular disorders: Secondary | ICD-10-CM

## 2015-02-15 DIAGNOSIS — Z0130 Encounter for examination of blood pressure without abnormal findings: Secondary | ICD-10-CM

## 2015-02-15 LAB — POCT INR: INR: 1.9

## 2015-02-15 NOTE — Progress Notes (Signed)
   Pt in nurse clinic for blood pressure check.  BP 122/80 manually and heart rate 68.  Pt denies any problems or concerns today.  Pt advised that his white count was a little and to stop taking the potassium and magnesium supplements per Dr. Ardelia Mems.  Lab appt scheduled for 03/08/15 at 9:15 AM.

## 2015-02-22 ENCOUNTER — Other Ambulatory Visit: Payer: Self-pay | Admitting: Family Medicine

## 2015-02-22 NOTE — Progress Notes (Signed)
BP now well controlled. Pt should continue on amlodipine.  He can likely follow up with me in 3 months, but may need to be sooner if his labwork is still abnormal. I will contact him after his labs are redrawn in a couple weeks.  Please inform him.  Leeanne Rio, MD

## 2015-03-01 ENCOUNTER — Ambulatory Visit (INDEPENDENT_AMBULATORY_CARE_PROVIDER_SITE_OTHER): Payer: Medicare Other | Admitting: *Deleted

## 2015-03-01 DIAGNOSIS — Z5181 Encounter for therapeutic drug level monitoring: Secondary | ICD-10-CM

## 2015-03-01 DIAGNOSIS — I4891 Unspecified atrial fibrillation: Secondary | ICD-10-CM | POA: Diagnosis not present

## 2015-03-01 LAB — POCT INR: INR: 2.3

## 2015-03-08 ENCOUNTER — Other Ambulatory Visit: Payer: Medicare Other

## 2015-03-08 DIAGNOSIS — D72819 Decreased white blood cell count, unspecified: Secondary | ICD-10-CM

## 2015-03-08 DIAGNOSIS — E876 Hypokalemia: Secondary | ICD-10-CM

## 2015-03-08 LAB — CBC WITH DIFFERENTIAL/PLATELET
Basophils Absolute: 0 K/uL (ref 0.0–0.1)
Basophils Relative: 0 % (ref 0–1)
Eosinophils Absolute: 0.1 K/uL (ref 0.0–0.7)
Eosinophils Relative: 2 % (ref 0–5)
HCT: 37.7 % — ABNORMAL LOW (ref 39.0–52.0)
Hemoglobin: 13.1 g/dL (ref 13.0–17.0)
Lymphocytes Relative: 55 % — ABNORMAL HIGH (ref 12–46)
Lymphs Abs: 1.7 K/uL (ref 0.7–4.0)
MCH: 30.3 pg (ref 26.0–34.0)
MCHC: 34.7 g/dL (ref 30.0–36.0)
MCV: 87.3 fL (ref 78.0–100.0)
MPV: 9.7 fL (ref 8.6–12.4)
Monocytes Absolute: 0.2 K/uL (ref 0.1–1.0)
Monocytes Relative: 6 % (ref 3–12)
Neutro Abs: 1.1 K/uL — ABNORMAL LOW (ref 1.7–7.7)
Neutrophils Relative %: 37 % — ABNORMAL LOW (ref 43–77)
Platelets: 144 K/uL — ABNORMAL LOW (ref 150–400)
RBC: 4.32 MIL/uL (ref 4.22–5.81)
RDW: 14.4 % (ref 11.5–15.5)
WBC: 3 K/uL — ABNORMAL LOW (ref 4.0–10.5)

## 2015-03-08 LAB — BASIC METABOLIC PANEL
BUN: 13 mg/dL (ref 6–23)
CALCIUM: 9.6 mg/dL (ref 8.4–10.5)
CO2: 23 meq/L (ref 19–32)
CREATININE: 1.26 mg/dL (ref 0.50–1.35)
Chloride: 105 mEq/L (ref 96–112)
Glucose, Bld: 115 mg/dL — ABNORMAL HIGH (ref 70–99)
POTASSIUM: 3.6 meq/L (ref 3.5–5.3)
Sodium: 137 mEq/L (ref 135–145)

## 2015-03-08 LAB — MAGNESIUM: Magnesium: 1.8 mg/dL (ref 1.5–2.5)

## 2015-03-08 NOTE — Progress Notes (Signed)
Bmp ,mag ,cbc with diff done today [name]

## 2015-03-11 ENCOUNTER — Telehealth: Payer: Self-pay | Admitting: Family Medicine

## 2015-03-11 DIAGNOSIS — D72819 Decreased white blood cell count, unspecified: Secondary | ICD-10-CM

## 2015-03-11 NOTE — Telephone Encounter (Signed)
Called pt to discuss persistent leukopenia. Neutrophil count is also low. Pt denies fevers but endorses that he's had occasional night sweats when it is particularly hot outside.  Recommended hematology referral. Pt is agreeable to this. Referral entered.  Leeanne Rio, MD

## 2015-03-18 ENCOUNTER — Encounter: Payer: Self-pay | Admitting: Family Medicine

## 2015-03-18 ENCOUNTER — Ambulatory Visit (INDEPENDENT_AMBULATORY_CARE_PROVIDER_SITE_OTHER): Payer: Medicare Other | Admitting: Family Medicine

## 2015-03-18 VITALS — BP 115/70 | HR 81 | Temp 98.5°F | Wt 148.9 lb

## 2015-03-18 DIAGNOSIS — M10069 Idiopathic gout, unspecified knee: Secondary | ICD-10-CM

## 2015-03-18 DIAGNOSIS — M109 Gout, unspecified: Secondary | ICD-10-CM

## 2015-03-18 MED ORDER — FEBUXOSTAT 40 MG PO TABS: 40.0000 mg | ORAL_TABLET | Freq: Every day | ORAL | Status: DC

## 2015-03-18 MED ORDER — COLCHICINE 0.6 MG PO TABS: 0.6000 mg | ORAL_TABLET | Freq: Two times a day (BID) | ORAL | Status: DC

## 2015-03-18 MED ORDER — METHYLPREDNISOLONE ACETATE 40 MG/ML IJ SUSP
40.0000 mg | Freq: Once | INTRAMUSCULAR | Status: AC
  Administered 2015-03-18: 40 mg via INTRA_ARTICULAR

## 2015-03-18 NOTE — Progress Notes (Addendum)
Patient ID: Jesse Franklin, male   DOB: 1951-03-30, 64 y.o.   MRN: [id number]   [facility] Aquilla Hacker, MD Phone: [phone]  Subjective:   # Gout Flare - Pt. Here for SDA with worsening pain in his right knee consistent with previous gout flare.  - He has had worsening pain and swelling in his right knee since 7/1.  - No fevers, chills, nausea or vomiting, there is some warmth and tenderness to the knee.  - He is compliant with his prophylactic colchicine therapy according to him though can't remember which "pill it is" whenever asked further.  - he is also on Uloric, but could not remember whether he was taking this either.  - He did drink 2 "pints" of wine 1.5 weeks ago, and has been eating red meat including making a "roast" over the weekend.  - He would like his knee drained / injected today.  - He otherwise has some pain in his right ankle and minimal pain on the left side.  - he has not been taking anything else for pain.   All relevant systems were reviewed and were negative unless otherwise noted in the HPI  Past Medical History Reviewed problem list.  Medications- reviewed and updated Current Outpatient Prescriptions  Medication Sig Dispense Refill  . amLODipine (NORVASC) 10 MG tablet Take 1 tablet (10 mg total) by mouth daily. 30 tablet 3  . calcium carbonate (TUMS) 500 MG chewable tablet Chew 1 tablet (200 mg of elemental calcium total) by mouth daily. 30 tablet 0  . clopidogrel (PLAVIX) 75 MG tablet Take 75 mg by mouth daily.  5  . colchicine 0.6 MG tablet Take 1 tablet (0.6 mg total) by mouth 2 (two) times daily. 60 tablet 2  . CRESTOR 40 MG tablet Take 40 mg by mouth daily.  3  . febuxostat (ULORIC) 40 MG tablet Take 1 tablet (40 mg total) by mouth daily. 30 tablet 5  . ferrous sulfate 325 (65 FE) MG tablet Take 1 tablet (325 mg total) by mouth daily with breakfast. 30 tablet 5  . KLOR-CON M20 20 MEQ tablet TAKE 1 TABLET BY MOUTH EVERY DAY  30 tablet 3  . magnesium oxide (MAG-OX) 400 (241.3 MG) MG tablet TAKE 1 TABLET (400 MG TOTAL) BY MOUTH 2 (TWO) TIMES DAILY. 60 tablet 2  . NEXIUM 40 MG capsule TAKE ONE CAPSULE BY MOUTH DAILY 90 capsule 3  . nitroGLYCERIN (NITROSTAT) 0.4 MG SL tablet Place 1 tablet (0.4 mg total) under the tongue as directed. Place 1 tab under tongue as directed 25 tablet 6  . warfarin (COUMADIN) 5 MG tablet TAKE AS DIRECTED BY ANTICOAGULATION CLINIC 40 tablet 2   No current facility-administered medications for this visit.   Facility-Administered Medications Ordered in Other Visits  Medication Dose Route Frequency Provider Last Rate Last Dose  . 0.9 %  sodium chloride infusion   Intravenous Continuous Willeen Niece, MD 125 mL/hr at 07/31/14 1200     Chief complaint-noted No additions to family history Social history- patient is a non smoker  Objective: BP 115/70 mmHg  Pulse 81  Temp(Src) 98.5 F (36.9 C)  Wt 148 lb 14.4 oz (67.541 kg) Gen: NAD, alert, cooperative with exam HEENT: NCAT, EOMI, PERRL Neck: FROM, supple CV: RRR, good S1/S2, no murmur, cap refill <3 Resp: CTABL, no wheezes, non-labored Abd: SNTND, BS present, no guarding or organomegaly Ext: Warmth, swelling, and tenderness of right knee. Pain with movement / pressure  with walking. Able to walk with a cane. Left knee without tenderness, or swelling at this time.  Neuro: Alert and oriented, No gross deficits Skin: no rashes no lesions  Assessment/Plan: See problem based a/p   Informed consent obtained and placed in chart.  Time out performed.  Area cleaned with iodine x 3 and wiped clear with alcohol swab.  Using 18 gauge 1 1/2 in needle 45cc of serious joint fluid was aspirated before 1 cc Kenalog and 3 cc's 1% Lidocaine were injected in knee joint via posterior approach.  Sterile bandage placed.  Patient tolerated procedure well.  No complications.    CGM MD

## 2015-03-18 NOTE — Assessment & Plan Note (Signed)
Pt. With acute gout flare by history. No evidence of infection at this time.   - Will give flare dosing of colchicine today.  - Will aspirate / inject joint with lidocaine / prednisone for reduced inflammation and pain relief.  - Continue colchicine ppx - Continue Uloric for now.  - Diet reviewed. This appears to be his major problem with controlling his gout.  - F/U with PCP for further management.  - Return precautions reviewed.

## 2015-03-18 NOTE — Patient Instructions (Signed)
Gout Gout is an inflammatory arthritis caused by a buildup of uric acid crystals in the joints. Uric acid is a chemical that is normally present in the blood. When the level of uric acid in the blood is too high it can form crystals that deposit in your joints and tissues. This causes joint redness, soreness, and swelling (inflammation). Repeat attacks are common. Over time, uric acid crystals can form into masses (tophi) near a joint, destroying bone and causing disfigurement. Gout is treatable and often preventable. CAUSES  The disease begins with elevated levels of uric acid in the blood. Uric acid is produced by your body when it breaks down a naturally found substance called purines. Certain foods you eat, such as meats and fish, contain high amounts of purines. Causes of an elevated uric acid level include:  Being passed down from parent to child (heredity).  Diseases that cause increased uric acid production (such as obesity, psoriasis, and certain cancers).  Excessive alcohol use.  Diet, especially diets rich in meat and seafood.  Medicines, including certain cancer-fighting medicines (chemotherapy), water pills (diuretics), and aspirin.  Chronic kidney disease. The kidneys are no longer able to remove uric acid well.  Problems with metabolism. Conditions strongly associated with gout include:  Obesity.  High blood pressure.  High cholesterol.  Diabetes. Not everyone with elevated uric acid levels gets gout. It is not understood why some people get gout and others do not. Surgery, joint injury, and eating too much of certain foods are some of the factors that can lead to gout attacks. SYMPTOMS   An attack of gout comes on quickly. It causes intense pain with redness, swelling, and warmth in a joint.  Fever can occur.  Often, only one joint is involved. Certain joints are more commonly involved:  Base of the big toe.  Knee.  Ankle.  Wrist.  Finger. Without  treatment, an attack usually goes away in a few days to weeks. Between attacks, you usually will not have symptoms, which is different from many other forms of arthritis. DIAGNOSIS  Your caregiver will suspect gout based on your symptoms and exam. In some cases, tests may be recommended. The tests may include:  Blood tests.  Urine tests.  X-rays.  Joint fluid exam. This exam requires a needle to remove fluid from the joint (arthrocentesis). Using a microscope, gout is confirmed when uric acid crystals are seen in the joint fluid. TREATMENT  There are two phases to gout treatment: treating the sudden onset (acute) attack and preventing attacks (prophylaxis).  Treatment of an Acute Attack.  Medicines are used. These include anti-inflammatory medicines or steroid medicines.  An injection of steroid medicine into the affected joint is sometimes necessary.  The painful joint is rested. Movement can worsen the arthritis.  You may use warm or cold treatments on painful joints, depending which works best for you.  Treatment to Prevent Attacks.  If you suffer from frequent gout attacks, your caregiver may advise preventive medicine. These medicines are started after the acute attack subsides. These medicines either help your kidneys eliminate uric acid from your body or decrease your uric acid production. You may need to stay on these medicines for a very long time.  The early phase of treatment with preventive medicine can be associated with an increase in acute gout attacks. For this reason, during the first few months of treatment, your caregiver may also advise you to take medicines usually used for acute gout treatment. Be sure you  understand your caregiver's directions. Your caregiver may make several adjustments to your medicine dose before these medicines are effective.  Discuss dietary treatment with your caregiver or dietitian. Alcohol and drinks high in sugar and fructose and foods  such as meat, poultry, and seafood can increase uric acid levels. Your caregiver or dietitian can advise you on drinks and foods that should be limited. HOME CARE INSTRUCTIONS   Do not take aspirin to relieve pain. This raises uric acid levels.  Only take over-the-counter or prescription medicines for pain, discomfort, or fever as directed by your caregiver.  Rest the joint as much as possible. When in bed, keep sheets and blankets off painful areas.  Keep the affected joint raised (elevated).  Apply warm or cold treatments to painful joints. Use of warm or cold treatments depends on which works best for you.  Use crutches if the painful joint is in your leg.  Drink enough fluids to keep your urine clear or pale yellow. This helps your body get rid of uric acid. Limit alcohol, sugary drinks, and fructose drinks.  Follow your dietary instructions. Pay careful attention to the amount of protein you eat. Your daily diet should emphasize fruits, vegetables, whole grains, and fat-free or low-fat milk products. Discuss the use of coffee, vitamin C, and cherries with your caregiver or dietitian. These may be helpful in lowering uric acid levels.  Maintain a healthy body weight. SEEK MEDICAL CARE IF:   You develop diarrhea, vomiting, or any side effects from medicines.  You do not feel better in 24 hours, or you are getting worse. SEEK IMMEDIATE MEDICAL CARE IF:   Your joint becomes suddenly more tender, and you have chills or a fever. MAKE SURE YOU:   Understand these instructions.  Will watch your condition.  Will get help right away if you are not doing well or get worse. Document Released: 08/25/2000 Document Revised: 01/12/2014 Document Reviewed: 04/10/2012 [facility] Patient Information 2015 [place], [place]. This information is not intended to replace advice given to you by your health care provider. Make sure you discuss any questions you have with your health care  provider.   Thanks for coming in today.   Take two pills of colchicine when you get home, and one pill one hour later. Then resume taking 0.6 mg twice daily.   We have injected your knee.   If you develop redness, swelling, worsening pain, fever, chills, nausea, or vomiting. Then please return for evaluation or go to the ED.   Thanks for letting us take care of you!   Sincerely,  Paula Compton, MD Family Medicine - PGY 2

## 2015-03-22 ENCOUNTER — Telehealth: Payer: Self-pay | Admitting: Hematology

## 2015-03-22 ENCOUNTER — Ambulatory Visit (INDEPENDENT_AMBULATORY_CARE_PROVIDER_SITE_OTHER): Payer: Medicare Other | Admitting: *Deleted

## 2015-03-22 DIAGNOSIS — Z5181 Encounter for therapeutic drug level monitoring: Secondary | ICD-10-CM

## 2015-03-22 DIAGNOSIS — I4891 Unspecified atrial fibrillation: Secondary | ICD-10-CM | POA: Diagnosis not present

## 2015-03-22 LAB — POCT INR: INR: 3.2

## 2015-03-22 NOTE — Telephone Encounter (Signed)
NEW  PATIENT APPT-S/W PATIENT AND GAVE NP APPT FOR 08/01 @ 10:30 W/DR. [place] DX-LEUKOPENIA

## 2015-04-02 ENCOUNTER — Other Ambulatory Visit: Payer: Self-pay | Admitting: Cardiovascular Disease

## 2015-04-02 ENCOUNTER — Encounter: Payer: Self-pay | Admitting: Family Medicine

## 2015-04-02 ENCOUNTER — Other Ambulatory Visit: Payer: Self-pay | Admitting: Family Medicine

## 2015-04-02 NOTE — Telephone Encounter (Signed)
LMOVM for pt to call us back inform him of message below. Deseree Kennon Holter, CMA

## 2015-04-02 NOTE — Telephone Encounter (Signed)
Red team please call pt and tell him: - he no longer needs the magnesium, we advised him to stop this several months ago - coumadin prescription should come from his heart doctor since they manage the coumadin dosing  Thanks, Leeanne Rio, MD

## 2015-04-02 NOTE — Telephone Encounter (Signed)
Pt called because he needs a refill on his Magnesium and Warfarincalled in. jw

## 2015-04-03 ENCOUNTER — Other Ambulatory Visit: Payer: Self-pay | Admitting: Family Medicine

## 2015-04-09 ENCOUNTER — Telehealth: Payer: Self-pay | Admitting: Hematology

## 2015-04-09 NOTE — Telephone Encounter (Signed)
Patient called to r/s appt for 08/03 @ 10 w/Dr. Irene Limbo

## 2015-04-12 ENCOUNTER — Ambulatory Visit: Payer: Medicare Other | Admitting: Hematology

## 2015-04-12 ENCOUNTER — Ambulatory Visit: Payer: Medicare Other

## 2015-04-12 ENCOUNTER — Ambulatory Visit (INDEPENDENT_AMBULATORY_CARE_PROVIDER_SITE_OTHER): Payer: Medicare Other | Admitting: *Deleted

## 2015-04-12 DIAGNOSIS — I4891 Unspecified atrial fibrillation: Secondary | ICD-10-CM | POA: Diagnosis not present

## 2015-04-12 DIAGNOSIS — Z5181 Encounter for therapeutic drug level monitoring: Secondary | ICD-10-CM

## 2015-04-12 LAB — POCT INR: INR: 1.7

## 2015-04-14 ENCOUNTER — Ambulatory Visit (HOSPITAL_BASED_OUTPATIENT_CLINIC_OR_DEPARTMENT_OTHER): Payer: Medicare Other

## 2015-04-14 ENCOUNTER — Encounter: Payer: Self-pay | Admitting: Hematology

## 2015-04-14 ENCOUNTER — Telehealth: Payer: Self-pay | Admitting: Hematology

## 2015-04-14 ENCOUNTER — Ambulatory Visit (HOSPITAL_BASED_OUTPATIENT_CLINIC_OR_DEPARTMENT_OTHER): Payer: Medicare Other | Admitting: Hematology

## 2015-04-14 ENCOUNTER — Ambulatory Visit: Payer: Medicare Other

## 2015-04-14 VITALS — BP 126/72 | HR 45 | Temp 98.4°F | Resp 20 | Ht 66.0 in | Wt 156.2 lb

## 2015-04-14 DIAGNOSIS — D709 Neutropenia, unspecified: Secondary | ICD-10-CM

## 2015-04-14 DIAGNOSIS — D649 Anemia, unspecified: Secondary | ICD-10-CM

## 2015-04-14 DIAGNOSIS — D696 Thrombocytopenia, unspecified: Secondary | ICD-10-CM

## 2015-04-14 DIAGNOSIS — D72819 Decreased white blood cell count, unspecified: Secondary | ICD-10-CM

## 2015-04-14 LAB — CBC & DIFF AND RETIC
BASO%: 0.3 % (ref 0.0–2.0)
BASOS ABS: 0 10*3/uL (ref 0.0–0.1)
EOS ABS: 0.1 10*3/uL (ref 0.0–0.5)
EOS%: 2 % (ref 0.0–7.0)
HEMATOCRIT: 36.7 % — AB (ref 38.4–49.9)
HGB: 12.8 g/dL — ABNORMAL LOW (ref 13.0–17.1)
IMMATURE RETIC FRACT: 6.4 % (ref 3.00–10.60)
LYMPH%: 51.9 % — ABNORMAL HIGH (ref 14.0–49.0)
MCH: 30.5 pg (ref 27.2–33.4)
MCHC: 34.9 g/dL (ref 32.0–36.0)
MCV: 87.6 fL (ref 79.3–98.0)
MONO#: 0.3 10*3/uL (ref 0.1–0.9)
MONO%: 9.4 % (ref 0.0–14.0)
NEUT#: 1.1 10*3/uL — ABNORMAL LOW (ref 1.5–6.5)
NEUT%: 36.4 % — AB (ref 39.0–75.0)
Platelets: 155 10*3/uL (ref 140–400)
RBC: 4.19 10*6/uL — ABNORMAL LOW (ref 4.20–5.82)
RDW: 14.4 % (ref 11.0–14.6)
Retic %: 1.46 % (ref 0.80–1.80)
Retic Ct Abs: 61.17 10*3/uL (ref 34.80–93.90)
WBC: 3 10*3/uL — AB (ref 4.0–10.3)
lymph#: 1.5 10*3/uL (ref 0.9–3.3)
nRBC: 0 % (ref 0–0)

## 2015-04-14 LAB — COMPREHENSIVE METABOLIC PANEL (CC13)
ALBUMIN: 4.2 g/dL (ref 3.5–5.0)
ALK PHOS: 105 U/L (ref 40–150)
ALT: 21 U/L (ref 0–55)
ANION GAP: 7 meq/L (ref 3–11)
AST: 24 U/L (ref 5–34)
BUN: 9.1 mg/dL (ref 7.0–26.0)
CO2: 24 meq/L (ref 22–29)
Calcium: 9.6 mg/dL (ref 8.4–10.4)
Chloride: 111 mEq/L — ABNORMAL HIGH (ref 98–109)
Creatinine: 1.1 mg/dL (ref 0.7–1.3)
EGFR: 80 mL/min/{1.73_m2} — ABNORMAL LOW (ref >90)
Glucose: 102 mg/dl (ref 70–140)
Potassium: 3.8 mEq/L (ref 3.5–5.1)
Sodium: 142 mEq/L (ref 136–145)
Total Bilirubin: 0.5 mg/dL (ref 0.20–1.20)
Total Protein: 7.8 g/dL (ref 6.4–8.3)

## 2015-04-14 LAB — LACTATE DEHYDROGENASE (CC13): LDH: 189 U/L (ref 125–245)

## 2015-04-14 LAB — CHCC SMEAR

## 2015-04-14 NOTE — Progress Notes (Signed)
Checked in new patient with no issues prior to seeing the dr. He has not traveled.

## 2015-04-14 NOTE — Progress Notes (Signed)
Marland Kitchen    CONSULT NOTE  Patient Care Team: Leeanne Rio, MD as PCP - General (Pediatrics) Thayer Headings, MD (Cardiology)  CHIEF COMPLAINTS/PURPOSE OF CONSULTATION:  leukopenia  HISTORY OF PRESENTING ILLNESS:  Jesse Franklin is a  64 y.o. male with a history of hypertension, atrial fibrillation, coronary artery disease status post cardiac catheterization, severe gout, tobacco abuse, alcohol abuse who has been referred by Dr. .Chrisandra Netters, MD  For evaluation and management of his leukopenia.  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 Recently earlier this here about February 2016 he was noted to have somewhat normal WBC counts likely due to the use of steroids and acute distress from an acute gout attack. Subsequent to this his recent labs have shown his WBC count going back down to 3000 with an [place] of 1.1k. He reports no concerns with frequent infections in childhood or even recently. He does note that he has been drinking a lot of wine over the last year or so which has led to increased  severity and frequency of his gout attacks. He notes that he has cut down his alcohol use over the last month and has also cut down his red meat intake both of which have been trigger factors for his gout. He has had a history of using cocaine in the past but denies any use for 11 years. He was counseled that 70% of the cocaine that enters the [place] is placed with levamisole that can cause neutropenia and agranulocytosis. He does note smoking one marijuana joint daily. He has been on colchicine 0.6 mg twice daily for more than a year and has been on Uloric as well for his gout. He reports never having seen a rheumatologist for management of his gout.  He previously has been noted to have drops in his hemoglobin including a hemoglobin in the 8 range in February 2016. He is on Coumadin for atrial fibrillation and on Plavix for  his coronary artery disease. He notes no concerns with overt GI or other bleeding. He has been treated with oral iron in the past and currently his hemoglobin is within normal limits.  His platelet counts a borderline low at 144,000 in June 2016.  Reports that with his limitation of red meat and frequent gout attacks and alcohol abuse he was eating poorly and lost weight from 167-156 pounds over the last 6 months. He notes that he is eating better now that he has stopped using alcohol and has regained 2 pounds in the last month or so.  Patient notes that he has never had blood problems as a child. He reports never having received any RBC or platelet transfusions. Denies using IV drugs. Denies unsafe sexual exposure. He did have hepatitis B and C serologies done in February which were negative. Does not recall having had an HIV test.  No fevers or chills. No other acute focal symptoms. No new lumps or bumps. No night sweats.  MEDICAL HISTORY:  Past Medical History  Diagnosis Date  . Anemia   . Hypertension   . Gout   . Atrial fibrillation   . GERD (gastroesophageal reflux disease)   . Hyperlipidemia   . Herpes   . RHINITIS, ALLERGIC 11/08/2006  . Herpes genitalia 04/13/2011  . CAD (coronary artery disease)     [place]  . Tobacco abuse   . Alcohol abuse   .  Cocaine abuse     Hx of  . BPH 11/08/2006  . Myocardial infarction 2005    SURGICAL HISTORY: Past Surgical History  Procedure Laterality Date  . Laceration repair Right 03/1988    "hand"  . Cardiac catheterization  08/2004    SOCIAL HISTORY: History   Social History  . Marital Status: Divorced    Spouse Name: N/A  . Number of Children: 0  . Years of Education: 8   Occupational History  . retired- construction/truck drive    Social History Main Topics  . Smoking status: Current Every Day Smoker -- 0.10 packs/day for 48 years    Types: Cigarettes  . Smokeless tobacco: Former Systems developer    Types: Chew    Quit date:  09/12/1967     Comment: Chewed [place] Tobacco  . Alcohol Use: No     Comment: Pt states that he quit since last gout flare up  . Drug Use: Yes    Special: "Crack" cocaine, Marijuana     Comment: past history of cocaine use. Patient admits to marijuana use.   Marland Kitchen Sexual Activity: Not Currently   Other Topics Concern  . Not on file   Social History Narrative   Health Care POA:    Emergency Contact: Cathren Laine, [phone]   End of Life Plan: gave pt AD pamphlet   Who lives with you: self   Any pets: none   Diet: Pt has a varied diet, but recently reports lacking appetite especially when    Exercise: Pt reports walking 20 minutes almost every day.   Seatbelts: Pt reports wearing seatbelt when in vehicles.    Hobbies: walking, watching tv             FAMILY HISTORY: Family History  Problem Relation Age of Onset  . Alcohol abuse Father   . Heart disease Brother   . Alcohol abuse Brother   . Cancer Sister   . Hypertension Mother   . Gout Mother     ALLERGIES:  is allergic to codeine and ketorolac tromethamine.  MEDICATIONS:  Current Outpatient Prescriptions  Medication Sig Dispense Refill  . amLODipine (NORVASC) 10 MG tablet Take 1 tablet (10 mg total) by mouth daily. 30 tablet 3  . calcium carbonate (TUMS) 500 MG chewable tablet Chew 1 tablet (200 mg of elemental calcium total) by mouth daily. 30 tablet 0  . clopidogrel (PLAVIX) 75 MG tablet Take 75 mg by mouth daily.  5  . colchicine 0.6 MG tablet Take 1 tablet (0.6 mg total) by mouth 2 (two) times daily. 60 tablet 2  . CRESTOR 40 MG tablet Take 40 mg by mouth daily.  3  . febuxostat (ULORIC) 40 MG tablet Take 1 tablet (40 mg total) by mouth daily. 30 tablet 5  . ferrous sulfate 325 (65 FE) MG tablet Take 1 tablet (325 mg total) by mouth daily with breakfast. 30 tablet 5  . KLOR-CON M20 20 MEQ tablet TAKE 1 TABLET BY MOUTH EVERY DAY 30 tablet 3  . NEXIUM 40 MG capsule TAKE ONE CAPSULE BY MOUTH DAILY 90  capsule 3  . nitroGLYCERIN (NITROSTAT) 0.4 MG SL tablet Place 1 tablet (0.4 mg total) under the tongue as directed. Place 1 tab under tongue as directed 25 tablet 6  . warfarin (COUMADIN) 5 MG tablet TAKE AS DIRECTED BY ANTICOAGULATION CLINIC 40 tablet 3   No current facility-administered medications for this visit.   Facility-Administered Medications Ordered in Other Visits  Medication Dose  Route Frequency Provider Last Rate Last Dose  . 0.9 %  sodium chloride infusion   Intravenous Continuous Willeen Niece, MD 125 mL/hr at 07/31/14 1200      REVIEW OF SYSTEMS:   Constitutional: Denies fevers, chills or abnormal night sweats Eyes: Denies blurriness of vision, double vision or watery eyes Ears, nose, mouth, throat, and face: Denies mucositis or sore throat Respiratory: Denies cough, dyspnea or wheezes Cardiovascular: Denies palpitation, chest discomfort or lower extremity swelling Gastrointestinal:  Denies nausea, heartburn or change in bowel habits Skin: Denies abnormal skin rashes Lymphatics: Denies new lymphadenopathy or easy bruising Neurological:Denies numbness, tingling or new weaknesses Behavioral/Psych: Mood is stable, no new changes  All other systems were reviewed with the patient and are negative.   PHYSICAL EXAMINATION: ECOG PERFORMANCE STATUS: 1 - Symptomatic but completely ambulatory  Filed Vitals:   04/14/15 1035  BP: 126/72  Pulse: 45  Temp: 98.4 F (36.9 C)  Resp: 20   Filed Weights   04/14/15 1035  Weight: 156 lb 3.2 oz (70.852 kg)    GENERAL:alert, no distress and comfortable SKIN: skin color, texture, turgor are normal, no rashes or significant lesions EYES: normal, conjunctiva are pink and non-injected, sclera clear OROPHARYNX:no exudate, no erythema and lips, buccal mucosa, and tongue normal  NECK: supple, thyroid normal size, non-tender, without nodularity LYMPH:  no palpable lymphadenopathy in the cervical, axillary or inguinal LUNGS: clear to  auscultation and percussion with normal breathing effort HEART: regular rate & rhythm and no murmurs and no lower extremity edema ABDOMEN:abdomen soft, non-tender and normal bowel sounds, no hepatosplenomegaly noted. Musculoskeletal:no cyanosis of digits and some clubbing noted which patient notes has been chronic PSYCH: alert & oriented x 3 with fluent speech NEURO: no focal motor/sensory deficits  LABORATORY DATA:  I have reviewed the data as listed  . CBC Latest Ref Rng 04/14/2015 03/08/2015 02/11/2015  WBC 4.0 - 10.3 10e3/uL 3.0(L) 3.0(L) 3.1(L)  Hemoglobin 13.0 - 17.1 g/dL 12.8(L) 13.1 12.1(L)  Hematocrit 38.4 - 49.9 % 36.7(L) 37.7(L) 36.6(L)  Platelets 140 - 400 10e3/uL 155 144(L) 168   . CMP Latest Ref Rng 04/14/2015 03/08/2015 02/11/2015  Glucose 70 - 140 mg/dl 102 115(H) 88  BUN 7.0 - 26.0 mg/dL 9.1 13 8   Creatinine 0.7 - 1.3 mg/dL 1.1 1.26 1.17  Sodium 136 - 145 mEq/L 142 137 143  Potassium 3.5 - 5.1 mEq/L 3.8 3.6 3.9  Chloride 96 - 112 mEq/L - 105 111  CO2 22 - 29 mEq/L 24 23 23   Calcium 8.4 - 10.4 mg/dL 9.6 9.6 9.3  Total Protein 6.4 - 8.3 g/dL 7.8 - 7.0  Total Bilirubin 0.20 - 1.20 mg/dL 0.50 - 0.6  Alkaline Phos 40 - 150 U/L 105 - 76  AST 5 - 34 U/L 24 - 18  ALT 0 - 55 U/L 21 - 10   . CBC Latest Ref Rng 03/08/2015 02/11/2015 11/11/2014  WBC 4.0 - 10.5 K/uL 3.0(L) 3.1(L) 6.7  Hemoglobin 13.0 - 17.0 g/dL 13.1 12.1(L) 10.1(L)  Hematocrit 39.0 - 52.0 % 37.7(L) 36.6(L) 30.9(L)  Platelets 150 - 400 K/uL 144(L) 168 357              Recent Labs  11/04/14 0623 11/05/14 0545 11/06/14 0520 11/11/14 1412 12/02/14 1123 02/11/15 1632 03/08/15 0915  NA 137 139 136 138 144 143 137  K 3.9 4.1 3.8 5.0 4.1 3.9 3.6  CL 108 106 104 102 108 111 105  CO2 23 21 25 26 23 23  23  GLUCOSE 143* 92 134* 121* 42* 88 115*  BUN 25* 26* 22 22 10 8 13   CREATININE 1.13 1.02 1.06 1.47* 1.10 1.17 1.26  CALCIUM 9.0 8.4 8.3* 9.3 9.1 9.3 9.6  GFRNONAA 68* 77* 74*  --   --   --   --   GFRAA  79* 90* 86*  --   --   --   --   PROT  --  6.2 6.8 6.9  --  7.0  --   ALBUMIN  --  2.0* 2.0* 3.5  --  4.1  --   AST  --  56* 43* 20  --  18  --   ALT  --  113* 102* 47  --  10  --   ALKPHOS  --  120* 116 128*  --  76  --   BILITOT  --  0.5 0.5 0.4  --  0.6  --    . Lab Results  Component Value Date   LDH 189 04/14/2015    peripheral blood smear reviewed personally by me: normocytic red cells with no schistocytes or micro-spherocytes. Platelets appear adequate. Few large platelets. Decreased neutrophils. No blasts. No significantly increased LGL's.  ASSESSMENT & PLAN:   64 year old African-American male referred for evaluation of leukopenia  #1 leukopenia/neutropenia. Patient appears to have some baseline leukopenia since 2008 which suggests the possibility of some baseline benign ethinic neutropenia which is more commonly seen in African-Americans. His WBC counts were higher in February likely due to steroids and the stress of acute gout. His WBC count and ANC is lower currently at [place] and 1.1K respectively. Patient is currently not significantly anemic and his mild thrombocytopenia from last month has resolved.  The likely additional culprits are alcohol abuse, metabolic stress from recurrent attacks of acute gout and more concernly the long-term use of colchicine which is known to cause leukopenia as well as aplastic anemia.  Peripheral blood smear does not show any overt evidence of a lymphoproliferative disorder.LDH is within normal limits. No lymphadenopathy or hepatosplenomegaly on examination.  He has had no issues with recurrent infections and no evidence of childhood recurrent infections to suggest a congenital neutropenia.  Patient denies recent cocaine use. He was counseled that about 70% of the cocaine entering the [place] is laced with levamisole which can cause significant neutropenia.  Hepatitis B and C profile was negative in February 2016.  Another possibility  might be passing respiratory viral infection subclinical.  Plan -No indication for G-CSF at this time. -Counseled patient regarding absolute alcohol cessation And cessation of drug abuse.. -Discussed with primary care physician about options other than colchicine for control of his gout. Would recommend consideration of rheumatology consultation to consider other treatment options as appropriate. -We'll check HIV antibody, B12 and folate, SPEP. -Smear personally reviewed as noted above. -repeat labs in 2 weeks with clinic follow-up. -If worsening leukopenia/neutropenia or other developing cytopenias will consider bone marrow examination.  #2 as noted above he has had anemia in the past significant hemoglobin fluctuations. Concern for GI bleed in the setting of Coumadin and antiplatelet therapy. Current hemoglobin levels are within normal limits. -Monitor for occult GI bleeding. -Consider GI workup if he starts becoming anemic again. -Alcohol cessation  Continue follow-up with primary care physician for management of other chronic medical comorbidities.   All questions were answered. The patient knows to call the clinic with any problems, questions or concerns. I spent 40 minutes counseling the patient face to face. The  total time spent in the appointment was 60 minutes and more than 50% was on counseling.   Sullivan Lone MD MS Hematology/Oncology Physician [facility]  (Office):       [phone] (Work cell):  [phone] (Fax):           [phone]   I appreciate the privilege of taking care of this patient. Kindly let me know of there are any other questions or concerns.

## 2015-04-14 NOTE — Telephone Encounter (Signed)
per po fto sch pt appt-gave pt copy of avs-sent back to lab °

## 2015-04-14 NOTE — Patient Instructions (Signed)
-  absolutely no alcohol -Will discuss with primary care physician about considering alternative treatment options for your gout which can allow for transitioning off colchicine and consideration of rheumatology evaluation. -Work on smoking cessation. -We'll see you in 2 weeks with your lab results from today.

## 2015-04-16 LAB — SPEP & IFE WITH QIG
ALPHA-1-GLOBULIN: 0.3 g/dL (ref 0.2–0.3)
ALPHA-2-GLOBULIN: 0.9 g/dL (ref 0.5–0.9)
Albumin ELP: 4.3 g/dL (ref 3.8–4.8)
BETA GLOBULIN: 0.4 g/dL (ref 0.4–0.6)
Beta 2: 0.5 g/dL (ref 0.2–0.5)
Gamma Globulin: 1.2 g/dL (ref 0.8–1.7)
IGA: 401 mg/dL — AB (ref 68–379)
IgG (Immunoglobin G), Serum: 1220 mg/dL (ref 650–1600)
IgM, Serum: 37 mg/dL — ABNORMAL LOW (ref 41–251)
TOTAL PROTEIN, SERUM ELECTROPHOR: 7.7 g/dL (ref 6.1–8.1)

## 2015-04-16 LAB — FOLATE

## 2015-04-16 LAB — VITAMIN B12: VITAMIN B 12: 334 pg/mL (ref 211–911)

## 2015-04-16 LAB — FOLATE RBC: RBC FOLATE: 588 ng/mL (ref >280)

## 2015-04-16 LAB — HIV ANTIBODY (ROUTINE TESTING W REFLEX): HIV 1&2 Ab, 4th Generation: NONREACTIVE

## 2015-04-22 DIAGNOSIS — H2513 Age-related nuclear cataract, bilateral: Secondary | ICD-10-CM | POA: Diagnosis not present

## 2015-04-22 DIAGNOSIS — H40033 Anatomical narrow angle, bilateral: Secondary | ICD-10-CM | POA: Diagnosis not present

## 2015-04-26 ENCOUNTER — Ambulatory Visit (INDEPENDENT_AMBULATORY_CARE_PROVIDER_SITE_OTHER): Payer: Medicare Other | Admitting: *Deleted

## 2015-04-26 DIAGNOSIS — Z5181 Encounter for therapeutic drug level monitoring: Secondary | ICD-10-CM | POA: Diagnosis not present

## 2015-04-26 DIAGNOSIS — I4891 Unspecified atrial fibrillation: Secondary | ICD-10-CM | POA: Diagnosis not present

## 2015-04-26 LAB — POCT INR: INR: 1.9

## 2015-04-28 ENCOUNTER — Ambulatory Visit (HOSPITAL_BASED_OUTPATIENT_CLINIC_OR_DEPARTMENT_OTHER): Payer: Medicare Other | Admitting: Hematology

## 2015-04-28 ENCOUNTER — Encounter: Payer: Self-pay | Admitting: Hematology

## 2015-04-28 VITALS — BP 119/65 | HR 51 | Temp 98.4°F | Resp 18 | Ht 66.0 in | Wt 155.9 lb

## 2015-04-28 DIAGNOSIS — G4700 Insomnia, unspecified: Secondary | ICD-10-CM

## 2015-04-28 DIAGNOSIS — D72819 Decreased white blood cell count, unspecified: Secondary | ICD-10-CM

## 2015-04-28 MED ORDER — HYDROXYZINE PAMOATE 25 MG PO CAPS: 25.0000 mg | ORAL_CAPSULE | Freq: Every evening | ORAL | Status: DC | PRN

## 2015-04-28 NOTE — Progress Notes (Signed)
.    Hematology oncology clinic note  Date of service 04/28/2015  Patient Care Team: Leeanne Rio, MD as PCP - General (Pediatrics) Thayer Headings, MD (Cardiology)  CHIEF COMPLAINTS/PURPOSE OF CONSULTATION:  leukopenia  HISTORY OF PRESENTING ILLNESS: Please see my previous clinic note from 04/14/2015 for details of initial consultation.  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  Current treatment: Observation, consider alternative medications to colchicine for gout management  Interval history  Patient is here for his follow-up clinic visit after his initial clinic consultation on 04/14/2015 for discussion of his results and defining further plans. He notes no acute new symptoms. No fevers or chills. No other acute focal symptoms. No new lumps or bumps. No night sweats.  We discussed all the lab results, peripheral blood smear and possible etiologies of his low WBC count/neutropenia. We confirmed that he has not had any issues with recurrent infections.  Notes some mild insomnia for which she requests a sleep aid.  No evidence of overt GI bleeding.  MEDICAL HISTORY:  Past Medical History  Diagnosis Date  . Anemia   . Hypertension   . Gout   . Atrial fibrillation   . GERD (gastroesophageal reflux disease)   . Hyperlipidemia   . Herpes   . RHINITIS, ALLERGIC 11/08/2006  . Herpes genitalia 04/13/2011  . CAD (coronary artery disease)     Branch Vessel  . Tobacco abuse   . Alcohol abuse   . Cocaine abuse     Hx of  . BPH 11/08/2006  . Myocardial infarction 2005    SURGICAL HISTORY: Past Surgical History  Procedure Laterality Date  . Laceration repair Right 03/1988    "hand"  . Cardiac catheterization  08/2004    SOCIAL HISTORY: Social History   Social History  . Marital Status: Divorced    Spouse Name: N/A  . Number of Children: 0  . Years of Education: 8   Occupational History  .  retired- construction/truck drive    Social History Main Topics  . Smoking status: Current Every Day Smoker -- 0.10 packs/day for 48 years    Types: Cigarettes  . Smokeless tobacco: Former Systems developer    Types: Chew    Quit date: 09/12/1967     Comment: Chewed Red Man Tobacco  . Alcohol Use: No     Comment: Pt states that he quit since last gout flare up  . Drug Use: Yes    Special: "Crack" cocaine, Marijuana     Comment: past history of cocaine use. Patient admits to marijuana use.   Marland Kitchen Sexual Activity: Not Currently   Other Topics Concern  . Not on file   Social History Narrative   Health Care POA:    Emergency Contact: Cathren Laine, [phone]   End of Life Plan: gave pt AD pamphlet   Who lives with you: self   Any pets: none   Diet: Pt has a varied diet, but recently reports lacking appetite especially when    Exercise: Pt reports walking 20 minutes almost every day.   Seatbelts: Pt reports wearing seatbelt when in vehicles.    Hobbies: walking, watching tv             FAMILY HISTORY: Family History  Problem Relation Age of Onset  . Alcohol abuse Father   . Heart disease Brother   . Alcohol abuse Brother   . Cancer Sister   . Hypertension Mother   .  Gout Mother     ALLERGIES:  is allergic to codeine and ketorolac tromethamine.  MEDICATIONS:  Current Outpatient Prescriptions  Medication Sig Dispense Refill  . amLODipine (NORVASC) 10 MG tablet Take 1 tablet (10 mg total) by mouth daily. 30 tablet 3  . calcium carbonate (TUMS) 500 MG chewable tablet Chew 1 tablet (200 mg of elemental calcium total) by mouth daily. 30 tablet 0  . clopidogrel (PLAVIX) 75 MG tablet Take 75 mg by mouth daily.  5  . colchicine 0.6 MG tablet Take 1 tablet (0.6 mg total) by mouth 2 (two) times daily. 60 tablet 2  . CRESTOR 40 MG tablet Take 40 mg by mouth daily.  3  . febuxostat (ULORIC) 40 MG tablet Take 1 tablet (40 mg total) by mouth daily. 30 tablet 5  . ferrous sulfate 325  (65 FE) MG tablet Take 1 tablet (325 mg total) by mouth daily with breakfast. 30 tablet 5  . KLOR-CON M20 20 MEQ tablet TAKE 1 TABLET BY MOUTH EVERY DAY 30 tablet 3  . nitroGLYCERIN (NITROSTAT) 0.4 MG SL tablet Place 1 tablet (0.4 mg total) under the tongue as directed. Place 1 tab under tongue as directed 25 tablet 6  . warfarin (COUMADIN) 5 MG tablet TAKE AS DIRECTED BY ANTICOAGULATION CLINIC 40 tablet 3  . hydrOXYzine (VISTARIL) 25 MG capsule Take 1-2 capsules (25-50 mg total) by mouth at bedtime as needed (insomnia despite following sleep hygiene recommendations). 60 capsule 0   No current facility-administered medications for this visit.   Facility-Administered Medications Ordered in Other Visits  Medication Dose Route Frequency Provider Last Rate Last Dose  . 0.9 %  sodium chloride infusion   Intravenous Continuous Willeen Niece, MD 125 mL/hr at 07/31/14 1200      REVIEW OF SYSTEMS:   Constitutional: Denies fevers, chills or abnormal night sweats Eyes: Denies blurriness of vision, double vision or watery eyes Ears, nose, mouth, throat, and face: Denies mucositis or sore throat Respiratory: Denies cough, dyspnea or wheezes Cardiovascular: Denies palpitation, chest discomfort or lower extremity swelling Gastrointestinal:  Denies nausea, heartburn or change in bowel habits Skin: Denies abnormal skin rashes Lymphatics: Denies new lymphadenopathy or easy bruising Neurological:Denies numbness, tingling or new weaknesses Behavioral/Psych: Mood is stable, no new changes  All other systems were reviewed with the patient and are negative.   PHYSICAL EXAMINATION: ECOG PERFORMANCE STATUS: 1 - Symptomatic but completely ambulatory  Filed Vitals:   04/28/15 1034  BP: 119/65  Pulse: 51  Temp: 98.4 F (36.9 C)  Resp: 18   Filed Weights   04/28/15 1034  Weight: 155 lb 14.4 oz (70.716 kg)    GENERAL:alert, no distress and comfortable SKIN: skin color, texture, turgor are normal, no  rashes or significant lesions EYES: normal, conjunctiva are pink and non-injected, sclera clear OROPHARYNX:no exudate, no erythema and lips, buccal mucosa, and tongue normal  NECK: supple, thyroid normal size, non-tender, without nodularity LYMPH:  no palpable lymphadenopathy in the cervical, axillary or inguinal LUNGS: clear to auscultation and percussion with normal breathing effort HEART: regular rate & rhythm and no murmurs and no lower extremity edema ABDOMEN:abdomen soft, non-tender and normal bowel sounds, no hepatosplenomegaly noted. Musculoskeletal:no cyanosis of digits and some clubbing noted which patient notes has been chronic PSYCH: alert & oriented x 3 with fluent speech NEURO: no focal motor/sensory deficits  LABORATORY DATA:  I have reviewed the data as listed  . CBC Latest Ref Rng 04/14/2015 03/08/2015 02/11/2015  WBC 4.0 - 10.3 10e3/uL  3.0(L) 3.0(L) 3.1(L)  Hemoglobin 13.0 - 17.1 g/dL 12.8(L) 13.1 12.1(L)  Hematocrit 38.4 - 49.9 % 36.7(L) 37.7(L) 36.6(L)  Platelets 140 - 400 10e3/uL 155 144(L) 168   . CMP Latest Ref Rng 04/14/2015 03/08/2015 02/11/2015  Glucose 70 - 140 mg/dl 102 115(H) 88  BUN 7.0 - 26.0 mg/dL 9.1 13 8   Creatinine 0.7 - 1.3 mg/dL 1.1 1.26 1.17  Sodium 136 - 145 mEq/L 142 137 143  Potassium 3.5 - 5.1 mEq/L 3.8 3.6 3.9  Chloride 96 - 112 mEq/L - 105 111  CO2 22 - 29 mEq/L 24 23 23   Calcium 8.4 - 10.4 mg/dL 9.6 9.6 9.3  Total Protein 6.4 - 8.3 g/dL 7.8 - 7.0  Total Bilirubin 0.20 - 1.20 mg/dL 0.50 - 0.6  Alkaline Phos 40 - 150 U/L 105 - 76  AST 5 - 34 U/L 24 - 18  ALT 0 - 55 U/L 21 - 10   Lab Results  Component Value Date   LDH 189 04/14/2015    Peripheral blood smear (04/14/2015) reviewed personally by me: normocytic red cells with no schistocytes or micro-spherocytes. Platelets appear adequate. Few large platelets. Decreased neutrophils. No blasts. No significantly increased LGL's.  ASSESSMENT & PLAN:   64 year old African-American male referred  for evaluation of leukopenia  #1 Leukopenia/neutropenia. Patient appears to have some baseline leukopenia since 2008 which suggests the possibility of some baseline Benign ethinic neutropenia which is more commonly seen in African-Americans. His WBC counts were higher in February likely due to steroids and the stress of acute gout. His WBC count and ANC is lower currently at [place] and 1.1K respectively. Patient is currently not significantly anemic and his mild thrombocytopenia from last month has resolved.  The likely additional culprits are alcohol abuse, metabolic stress from recurrent attacks of acute gout and more concernly the long-term use of colchicine which is known to cause leukopenia as well as aplastic anemia.  Peripheral blood smear does not show any overt evidence of a lymphoproliferative disorder.LDH is within normal limits. No lymphadenopathy or hepatosplenomegaly on examination.  He has had no issues with recurrent infections and no evidence of childhood recurrent infections to suggest a congenital neutropenia.  Patient denies recent cocaine use. He was counseled that about 70% of the cocaine entering the [place] is laced with levamisole which can cause significant neutropenia.  Hepatitis B and C profile was negative in February 2016. HIV test negative. B12 and folate levels okay SPEP did not show any signs of monoclonal protein   Plan -No indication for G-CSF at this time. - reinforced recommendations for absolute alcohol cessation And cessation of drug abuse.Marland Kitchen He was recommended to discuss with his  primary care physician about options other than colchicine for control of his gout. Would recommend consideration of rheumatology consultation to consider other treatment options as appropriate. -Follow-up CBC in 3-4 months with primary care physician. -I would be happy to evaluate him if worsening cytopenias noted. -If worsening leukopenia/neutropenia or other developing  cytopenias will consider bone marrow examination.  #2 Insomnia - poor sleep hygiene and some element from alcohol cessation. Plan -Patient educated on sleep hygiene and given an educational printout on techniques to maintain sleep hygiene. -Given prescription for when necessary Vistaril at nighttime if absolutely needed for insomnia.  Continue follow-up with primary care physician for management of other chronic medical comorbidities.  Return to clinic with Dr. Irene Limbo on an as-needed basis if any new questions or concerns arise. Repeat CBC with primary care  physician in 3-4 months to monitor her counts.  All questions were answered. I spent 10 minutes counseling the patient face to face. The total time spent in the appointment was 20 minutes and more than 50% was on counseling.   Sullivan Lone MD MS Hematology/Oncology Physician [facility]  (Office):       [phone] (Work cell):  [phone] (Fax):           [phone]   I appreciate the privilege of taking care of this patient. Kindly let me know of there are any other questions or concerns.

## 2015-04-29 ENCOUNTER — Telehealth: Payer: Self-pay | Admitting: Hematology

## 2015-04-29 NOTE — Telephone Encounter (Signed)
Per 08/17 POF f/u as needed.... KJ

## 2015-05-04 DIAGNOSIS — H16223 Keratoconjunctivitis sicca, not specified as Sjogren's, bilateral: Secondary | ICD-10-CM | POA: Diagnosis not present

## 2015-05-10 ENCOUNTER — Ambulatory Visit (INDEPENDENT_AMBULATORY_CARE_PROVIDER_SITE_OTHER): Payer: Medicare Other | Admitting: *Deleted

## 2015-05-10 DIAGNOSIS — I4891 Unspecified atrial fibrillation: Secondary | ICD-10-CM

## 2015-05-10 DIAGNOSIS — Z5181 Encounter for therapeutic drug level monitoring: Secondary | ICD-10-CM | POA: Diagnosis not present

## 2015-05-10 LAB — POCT INR: INR: 1.6

## 2015-05-13 ENCOUNTER — Telehealth: Payer: Self-pay | Admitting: Family Medicine

## 2015-05-13 DIAGNOSIS — H9190 Unspecified hearing loss, unspecified ear: Secondary | ICD-10-CM

## 2015-05-13 NOTE — Telephone Encounter (Signed)
Referral entered to audiology. Leeanne Rio, MD

## 2015-05-13 NOTE — Telephone Encounter (Signed)
Red team, can you call pt and get a little more history: Who has he seen for this previously and what was he told? Who does he want to go see? If I have this info I will be happy to place ENT referral, if appropriate.  Leeanne Rio, MD

## 2015-05-13 NOTE — Telephone Encounter (Signed)
Patient needs a referral to an ENT facility to "get a second opinion" on assessment for a hearing aid. Please f/u with patient. Jesse Franklin, ASA

## 2015-05-13 NOTE — Telephone Encounter (Signed)
Patient states he was seen by nurse from his insurance [facility]) and he could not hear out of left ear but nurse noted no problems or impaction in the ear itself. Was recommended that he see an audiologist but needs referral for insurance purposes.

## 2015-05-24 ENCOUNTER — Ambulatory Visit (INDEPENDENT_AMBULATORY_CARE_PROVIDER_SITE_OTHER): Payer: Medicare Other | Admitting: Pharmacist

## 2015-05-24 DIAGNOSIS — Z5181 Encounter for therapeutic drug level monitoring: Secondary | ICD-10-CM

## 2015-05-24 DIAGNOSIS — I4891 Unspecified atrial fibrillation: Secondary | ICD-10-CM

## 2015-05-24 LAB — POCT INR: INR: 2.3

## 2015-05-26 ENCOUNTER — Telehealth: Payer: Self-pay | Admitting: *Deleted

## 2015-05-26 NOTE — Telephone Encounter (Signed)
Call from [place] at Dr Hoyt Koch office and she states he is scheduled for dental procedure on 06/03/2015 and that Dr Hoyt Koch wants his INR checked on 06/01/2015 and call results to his office  Pt called and instructed last day to take coumadin is Sept 16th and no coumadin until procedure done and appt made for him to be seen in coumadin clinic on Sept 20th and pt verbalizes understanding and repeats back to nurse correct instructions On clearance note states may restart coumadin on day of procedure if ok with Dr Hoyt Koch

## 2015-06-01 ENCOUNTER — Other Ambulatory Visit: Payer: Self-pay | Admitting: Family Medicine

## 2015-06-01 ENCOUNTER — Ambulatory Visit (INDEPENDENT_AMBULATORY_CARE_PROVIDER_SITE_OTHER): Payer: Medicare Other | Admitting: Pharmacist Clinician (PhC)/ Clinical Pharmacy Specialist

## 2015-06-01 DIAGNOSIS — I4891 Unspecified atrial fibrillation: Secondary | ICD-10-CM | POA: Diagnosis not present

## 2015-06-01 DIAGNOSIS — Z5181 Encounter for therapeutic drug level monitoring: Secondary | ICD-10-CM

## 2015-06-01 LAB — POCT INR: INR: 1.2

## 2015-06-03 DIAGNOSIS — K1379 Other lesions of oral mucosa: Secondary | ICD-10-CM | POA: Diagnosis not present

## 2015-06-08 DIAGNOSIS — K1379 Other lesions of oral mucosa: Secondary | ICD-10-CM | POA: Diagnosis not present

## 2015-06-15 ENCOUNTER — Ambulatory Visit (INDEPENDENT_AMBULATORY_CARE_PROVIDER_SITE_OTHER): Payer: Medicare Other | Admitting: *Deleted

## 2015-06-15 DIAGNOSIS — Z5181 Encounter for therapeutic drug level monitoring: Secondary | ICD-10-CM

## 2015-06-15 DIAGNOSIS — I4891 Unspecified atrial fibrillation: Secondary | ICD-10-CM

## 2015-06-15 LAB — POCT INR: INR: 2.2

## 2015-06-30 ENCOUNTER — Telehealth: Payer: Self-pay | Admitting: Family Medicine

## 2015-06-30 DIAGNOSIS — H9190 Unspecified hearing loss, unspecified ear: Secondary | ICD-10-CM

## 2015-06-30 NOTE — Telephone Encounter (Signed)
Pt would like a referral to see an ENT doctor. jw

## 2015-07-01 NOTE — Telephone Encounter (Signed)
Spoke with patient, he states MD referred him to an audiologist at the outpatient rehab center for his hearing deficit but prefers to be seen at [facility] E[facility] instead for this issue. Will forward to PCP.

## 2015-07-02 NOTE — Telephone Encounter (Signed)
Referral entered. Leeanne Rio, MD

## 2015-07-12 DIAGNOSIS — H918X2 Other specified hearing loss, left ear: Secondary | ICD-10-CM | POA: Diagnosis not present

## 2015-07-13 ENCOUNTER — Encounter: Payer: Self-pay | Admitting: Cardiovascular Disease

## 2015-07-13 ENCOUNTER — Ambulatory Visit (INDEPENDENT_AMBULATORY_CARE_PROVIDER_SITE_OTHER): Payer: Medicare Other | Admitting: Cardiovascular Disease

## 2015-07-13 ENCOUNTER — Ambulatory Visit (INDEPENDENT_AMBULATORY_CARE_PROVIDER_SITE_OTHER): Payer: Medicare Other | Admitting: *Deleted

## 2015-07-13 VITALS — BP 116/70 | HR 50 | Ht 66.0 in | Wt 160.4 lb

## 2015-07-13 DIAGNOSIS — I10 Essential (primary) hypertension: Secondary | ICD-10-CM

## 2015-07-13 DIAGNOSIS — Z5181 Encounter for therapeutic drug level monitoring: Secondary | ICD-10-CM

## 2015-07-13 DIAGNOSIS — I4891 Unspecified atrial fibrillation: Secondary | ICD-10-CM

## 2015-07-13 DIAGNOSIS — I482 Chronic atrial fibrillation, unspecified: Secondary | ICD-10-CM

## 2015-07-13 DIAGNOSIS — I25119 Atherosclerotic heart disease of native coronary artery with unspecified angina pectoris: Secondary | ICD-10-CM

## 2015-07-13 DIAGNOSIS — E7800 Pure hypercholesterolemia, unspecified: Secondary | ICD-10-CM | POA: Diagnosis not present

## 2015-07-13 DIAGNOSIS — E785 Hyperlipidemia, unspecified: Secondary | ICD-10-CM | POA: Diagnosis not present

## 2015-07-13 LAB — BASIC METABOLIC PANEL
BUN: 10 mg/dL (ref 7–25)
CALCIUM: 10.3 mg/dL (ref 8.6–10.3)
CO2: 22 mmol/L (ref 20–31)
Chloride: 107 mmol/L (ref 98–110)
Creat: 1.08 mg/dL (ref 0.70–1.25)
GLUCOSE: 74 mg/dL (ref 65–99)
POTASSIUM: 3.8 mmol/L (ref 3.5–5.3)
SODIUM: 139 mmol/L (ref 135–146)

## 2015-07-13 LAB — HEPATIC FUNCTION PANEL
ALK PHOS: 100 U/L (ref 40–115)
ALT: 28 U/L (ref 9–46)
AST: 37 U/L — AB (ref 10–35)
Albumin: 4.1 g/dL (ref 3.6–5.1)
BILIRUBIN INDIRECT: 0.3 mg/dL (ref 0.2–1.2)
BILIRUBIN TOTAL: 0.4 mg/dL (ref 0.2–1.2)
Bilirubin, Direct: 0.1 mg/dL (ref <=0.2)
Total Protein: 8.1 g/dL (ref 6.1–8.1)

## 2015-07-13 LAB — LIPID PANEL
CHOL/HDL RATIO: 3.4 ratio (ref <=5.0)
CHOLESTEROL: 133 mg/dL (ref 125–200)
HDL: 39 mg/dL — ABNORMAL LOW (ref >=40)
LDL CALC: 77 mg/dL (ref <130)
TRIGLYCERIDES: 84 mg/dL (ref <150)
VLDL: 17 mg/dL (ref <30)

## 2015-07-13 LAB — POCT INR: INR: 2.8

## 2015-07-13 NOTE — Progress Notes (Signed)
Cardiology Office Note   Date:  07/13/2015   ID:  Jesse Franklin, DOB 01-12-1951, MRN [id number]  PCP:  Chrisandra Netters, MD  Cardiologist:   Acie Fredrickson Wonda Cheng, MD   Chief Complaint  Patient presents with  . Coronary Artery Disease   1. Atrial fibrillation -  2. Coronary artery disease RESULTS: 08/30/04 1. Left main: Normal.  2. LAD: Large vessel, tortuous in the mid and distal section with mild  luminal irregularities. The extreme distal portion of the LAD after the  trifurcation of the distal vessel has a subtotal occlusion at the  trifurcation tip. Vessel less than 0.5 mm at this juncture.  3. LXC: Codominant with mild luminal irregularities.  4. OM1 and OM2: Large vessels with mild luminal irregularities.  5. RCA: Tortuous, codominant with mild luminal irregularities, very faint  right to left filling collaterals seen at the apex.  6. LV: EF 50-55% with mild inferior apical hypokinesis. LVEDP was 35  mmHg.  7. Abdominal/distal aortogram showed mild tortuosity. No significant  aneurysms were seen. The distal aorta showed mild splaying to the left  at the area of the iliac bifurcation. No significant iliac or femoral  disease was noted.  3. Hypertension 4. 6. Moderate pulmonary hypertension by echo in 2010 ( ongoing cigarette smoking)  5. Hyperlipidemia   History of Present Illness:  Jesse Franklin is a 64 yo with hx as noted above. He has occasional episodes of left arm tingling. These episodes last 1-2 seconds. They're not necessarily associated with exertion. The tingling seems to get better with arm movement. He also has occasional episodes of shortness of breath.  He walks on a regular basis. He walks approximately 1 mile a day. He still eats some salt and also eats fried and fast foods.  December 02, 2013:  Jesse Franklin is doing well. He denies any chest pain or shortness of breath. He walks about 15-20 minutes each day. He no longer works. He has cut down on  his salt usage.    [name] 5, 2016:   Jesse Franklin is a 64 y.o. male who presents for follow-up of his atrial fibrillation. He also has a history of hypertension  Still smoking  -   Nov. 1, 2016:  Doing well from a cardiac standpoint.  Having gout issues  in left elbow and hand.  No Cp or dyspnea.  Still smoking    Past Medical History  Diagnosis Date  . Anemia   . Hypertension   . Gout   . Atrial fibrillation ([place])   . GERD (gastroesophageal reflux disease)   . Hyperlipidemia   . Herpes   . RHINITIS, ALLERGIC 11/08/2006  . Herpes genitalia 04/13/2011  . CAD (coronary artery disease)     Branch Vessel  . Tobacco abuse   . Alcohol abuse   . Cocaine abuse     Hx of  . BPH 11/08/2006  . Myocardial infarction [facility]) 2005    Past Surgical History  Procedure Laterality Date  . Laceration repair Right 03/1988    "hand"  . Cardiac catheterization  08/2004     Current Outpatient Prescriptions  Medication Sig Dispense Refill  . amLODipine (NORVASC) 10 MG tablet Take 1 tablet (10 mg total) by mouth daily. 30 tablet 3  . calcium carbonate (TUMS) 500 MG chewable tablet Chew 1 tablet (200 mg of elemental calcium total) by mouth daily. 30 tablet 0  . clopidogrel (PLAVIX) 75 MG tablet Take 75 mg by mouth daily.  5  .  colchicine 0.6 MG tablet Take 1 tablet (0.6 mg total) by mouth 2 (two) times daily. 60 tablet 2  . CRESTOR 40 MG tablet Take 40 mg by mouth daily.  3  . febuxostat (ULORIC) 40 MG tablet Take 1 tablet (40 mg total) by mouth daily. 30 tablet 5  . ferrous sulfate 325 (65 FE) MG tablet TAKE 1 TABLET (325 MG TOTAL) BY MOUTH DAILY WITH BREAKFAST. 30 tablet 2  . hydrOXYzine (VISTARIL) 25 MG capsule Take 1-2 capsules (25-50 mg total) by mouth at bedtime as needed (insomnia despite following sleep hygiene recommendations). 60 capsule 0  . KLOR-CON M20 20 MEQ tablet TAKE 1 TABLET BY MOUTH EVERY DAY 30 tablet 3  . nitroGLYCERIN (NITROSTAT) 0.4 MG SL tablet Place 1 tablet (0.4 mg  total) under the tongue as directed. Place 1 tab under tongue as directed 25 tablet 6  . warfarin (COUMADIN) 5 MG tablet TAKE AS DIRECTED BY ANTICOAGULATION CLINIC 40 tablet 3   No current facility-administered medications for this visit.   Facility-Administered Medications Ordered in Other Visits  Medication Dose Route Frequency Provider Last Rate Last Dose  . 0.9 %  sodium chloride infusion   Intravenous Continuous Willeen Niece, MD 125 mL/hr at 07/31/14 1200      Allergies:   Codeine and Ketorolac tromethamine    Social History:  The patient  reports that he has been smoking Cigarettes.  He has a 4.8 pack-year smoking history. He quit smokeless tobacco use about 47 years ago. His smokeless tobacco use included Chew. He reports that he uses illicit drugs ("Crack" cocaine and Marijuana). He reports that he does not drink alcohol.   Family History:  The patient's family history includes Alcohol abuse in his brother and father; Cancer in his sister; Gout in his mother; Heart disease in his brother; Hypertension in his mother.    ROS:  Please see the history of present illness.    Review of Systems: Constitutional:  denies fever, chills, diaphoresis, appetite change and fatigue.  HEENT: denies photophobia, eye pain, redness, hearing loss, ear pain, congestion, sore throat, rhinorrhea, sneezing, neck pain, neck stiffness and tinnitus.  Respiratory: denies SOB, DOE, cough, chest tightness, and wheezing.  Cardiovascular: denies chest pain, palpitations and leg swelling.  Gastrointestinal: denies nausea, vomiting, abdominal pain, diarrhea, constipation, blood in stool.  Genitourinary: denies dysuria, urgency, frequency, hematuria, flank pain and difficulty urinating.  Musculoskeletal: denies  myalgias, back pain, joint swelling, arthralgias and gait problem.   Skin: denies pallor, rash and wound.  Neurological: denies dizziness, seizures, syncope, weakness, light-headedness, numbness and  headaches.   Hematological: denies adenopathy, easy bruising, personal or family bleeding history.  Psychiatric/ Behavioral: denies suicidal ideation, mood changes, confusion, nervousness, sleep disturbance and agitation.       All other systems are reviewed and negative.    PHYSICAL EXAM: VS:  BP 116/70 mmHg  Pulse 50  Ht 5' 6"  (1.676 m)  Wt 160 lb 6.4 oz (72.757 kg)  BMI 25.90 kg/m2  SpO2 98% , BMI Body mass index is 25.9 kg/(m^2). GEN: Well nourished, well developed, in no acute distress HEENT: normal Neck: no JVD, carotid bruits, or masses Cardiac: Irreg. Irreg. ;  Soft systolic murmur and soft diastolic murmur , no  rubs, or gallops,no edema  Respiratory:  clear to auscultation bilaterally, normal work of breathing GI: soft, nontender, nondistended, + BS MS: no deformity or atrophy Skin: warm and dry, no rash Neuro:  Strength and sensation are intact Psych: normal  EKG:  EKG is ordered today. The ekg ordered today demonstrates :  Atrial fib at rate of 59.  TWI in the lateral leads.   No changes from previous tracing    Recent Labs: 11/03/2014: TSH 2.436 03/08/2015: Magnesium 1.8 04/14/2015: ALT 21; BUN 9.1; Creatinine 1.1; HGB 12.8*; Platelets 155; Potassium 3.8; Sodium 142    Lipid Panel    Component Value Date/Time   CHOL 129 07/09/2014 1025   TRIG 105 07/09/2014 1025   HDL 34* 07/09/2014 1025   CHOLHDL 3.8 07/09/2014 1025   VLDL 21 07/09/2014 1025   LDLCALC 74 07/09/2014 1025   LDLDIRECT 95 05/28/2012 0925      Wt Readings from Last 3 Encounters:  07/13/15 160 lb 6.4 oz (72.757 kg)  04/28/15 155 lb 14.4 oz (70.716 kg)  04/14/15 156 lb 3.2 oz (70.852 kg)      Other studies Reviewed: Additional studies/ records that were reviewed today include: . Review of the above records demonstrates:    ASSESSMENT AND PLAN:  1. Atrial fibrillation-  rate is well controlled. Continue current dose of warfarin.  2. Coronary artery disease RESULTS: 08/30/04 1.  Left main: Normal.  2. LAD: Large vessel, tortuous in the mid and distal section with mild  luminal irregularities. The extreme distal portion of the LAD after the  trifurcation of the distal vessel has a subtotal occlusion at the  trifurcation tip. Vessel less than 0.5 mm at this juncture.  3. LXC: Codominant with mild luminal irregularities.  4. OM1 and OM2: Large vessels with mild luminal irregularities.  5. RCA: Tortuous, codominant with mild luminal irregularities, very faint  right to left filling collaterals seen at the apex.  6. LV: EF 50-55% with mild inferior apical hypokinesis. LVEDP was 35  mmHg.  7. Abdominal/distal aortogram showed mild tortuosity. No significant  aneurysms were seen. The distal aorta showed mild splaying to the left  at the area of the iliac bifurcation. No significant iliac or femoral  disease was noted.  3. Hypertension - BP is ok   4.  Moderate pulmonary hypertension by echo in 2010 ( ongoing cigarette smoking) - encouraged him to stop 5. Hyperlipidemia - we'll check labs today.    Current medicines are reviewed at length with the patient today.  The patient does not have concerns regarding medicines.  The following changes have been made:  no change  Labs/ tests ordered today include:  No orders of the defined types were placed in this encounter.     Disposition:   FU with me in 6 months      Nahser, Wonda Cheng, MD  07/13/2015 9:33 AM    [address] [place], [place], [place]  [address] Phone: [phone]; Fax: [phone]   [facility]  [address] [place], [place]  [address] [phone]    Fax [phone]

## 2015-07-13 NOTE — Patient Instructions (Addendum)
Medication Instructions:  Your physician recommends that you continue on your current medications as directed. Please refer to the Current Medication list given to you today.   Labwork: TODAY - cholesterol, liver, basic metabolic panel   Testing/Procedures: None Ordered   Follow-Up: Your physician wants you to follow-up in: 6 months with Dr. Acie Fredrickson.  You will receive a reminder letter in the mail two months in advance. If you don't receive a letter, please call our office to schedule the follow-up appointment.   If you need a refill on your cardiac medications before your next appointment, please call your pharmacy.   Thank you for choosing [facility]! Christen Bame, RN [phone]

## 2015-07-14 ENCOUNTER — Ambulatory Visit: Payer: Medicare Other | Admitting: Audiology

## 2015-07-15 ENCOUNTER — Other Ambulatory Visit: Payer: Self-pay | Admitting: Otolaryngology

## 2015-07-15 DIAGNOSIS — H918X9 Other specified hearing loss, unspecified ear: Secondary | ICD-10-CM

## 2015-07-22 ENCOUNTER — Encounter: Payer: Self-pay | Admitting: Family Medicine

## 2015-07-22 ENCOUNTER — Ambulatory Visit (INDEPENDENT_AMBULATORY_CARE_PROVIDER_SITE_OTHER): Payer: Medicare Other | Admitting: Family Medicine

## 2015-07-22 VITALS — BP 118/74 | HR 64 | Temp 97.9°F | Ht 66.0 in | Wt 159.6 lb

## 2015-07-22 DIAGNOSIS — M109 Gout, unspecified: Secondary | ICD-10-CM

## 2015-07-22 DIAGNOSIS — M10062 Idiopathic gout, left knee: Secondary | ICD-10-CM | POA: Diagnosis not present

## 2015-07-22 NOTE — Patient Instructions (Signed)
Thank you for coming to see me today. It was a pleasure. Today we talked about:   Gout: we took some fluid off your knee. Please keep taking the colchicine. Follow-up with your primary care physician. If your knee gets red, hot and swollen or if you get fevers, please return  If you have any questions or concerns, please do not hesitate to call the office at [phone].  Sincerely,  Cordelia Poche, MD

## 2015-07-22 NOTE — Progress Notes (Signed)
    Subjective   Jesse Franklin is a 64 y.o. male that presents for a same day visit  1. Left knee pain: Symptoms started three weeks ago in his hands, elbows, shoulders and then down to his knee. His knee pain started one week ago. The pain in his other joints have resolved. He has been keeping his knee elevated and using ibuprofen 635m once per day. He has been taking colchicine daily. No injury to his knee. He has pain with walking. He reports eating "red meat" which he believes triggered this episode.   ROS Per HPI  Social History  Substance Use Topics  . Smoking status: Current Every Day Smoker -- 0.10 packs/day for 48 years    Types: Cigarettes  . Smokeless tobacco: Former [employer]   Types: Chew    Quit date: 09/12/1967     Comment: Chewed [place] Tobacco  . Alcohol Use: No     Comment: Pt states that he quit since last gout flare up    Allergies  Allergen Reactions  . Codeine Hives  . Ketorolac Tromethamine     REACTION: hives    Objective   BP 118/74 mmHg  Pulse 64  Temp(Src) 97.9 F (36.6 C) (Oral)  Ht 5' 6"  (1.676 m)  Wt 159 lb 9.6 oz (72.394 kg)  BMI 25.77 kg/m2  General: Well appearing, no distress Musculoskeletal: Right knee swollen and warm. Very limited ROM. Effusion present.  Assessment and Plan   Aspiration/Injection Procedure Note Jesse CRIHFIELD7Jun 21, 1952 Procedure: Aspiration Indications: Knee effusion  Procedure Details Consent: Risks of procedure as well as the alternatives and risks of each were explained to the (patient/caregiver).  Consent for procedure obtained. Time Out: Verified patient identification, verified procedure, site/side was marked, verified correct patient position, special equipment/implants available, medications/allergies/relevent history reviewed, required imaging and test results available.  Performed.  The area was cleaned with iodine and alcohol swabs.    Amount of Fluid Aspirated: 659mCharacter of Fluid: clear and  straw colored A sterile dressing was applied.  Patient did tolerate procedure well. Estimated blood loss: 5cc  Acute gout: Last uric acid of 4.9. Patient adherent with colchicine. Patient did not tolerate depo-medrol injection. - continue colchicine - follow-up with PCP - return precautions

## 2015-07-23 ENCOUNTER — Ambulatory Visit
Admission: RE | Admit: 2015-07-23 | Discharge: 2015-07-23 | Disposition: A | Discharge status: Home / Self Care | Payer: Medicare Other | Source: Ambulatory Visit | Attending: Otolaryngology | Admitting: Otolaryngology

## 2015-07-23 DIAGNOSIS — H918X9 Other specified hearing loss, unspecified ear: Secondary | ICD-10-CM

## 2015-07-23 DIAGNOSIS — I639 Cerebral infarction, unspecified: Secondary | ICD-10-CM | POA: Diagnosis not present

## 2015-07-23 MED ORDER — GADOBENATE DIMEGLUMINE 529 MG/ML IV SOLN
14.0000 mL | Freq: Once | INTRAVENOUS | Status: AC | PRN
  Administered 2015-07-23: 14 mL via INTRAVENOUS

## 2015-07-23 MED ORDER — METHYLPREDNISOLONE ACETATE 40 MG/ML IJ SUSP
40.0000 mg | Freq: Once | INTRAMUSCULAR | Status: AC
  Administered 2015-07-22: 40 mg via INTRA_ARTICULAR

## 2015-07-28 ENCOUNTER — Other Ambulatory Visit: Payer: Self-pay | Admitting: Cardiovascular Disease

## 2015-07-28 ENCOUNTER — Other Ambulatory Visit: Payer: Self-pay | Admitting: Family Medicine

## 2015-08-03 ENCOUNTER — Other Ambulatory Visit: Payer: Self-pay | Admitting: Family Medicine

## 2015-08-05 ENCOUNTER — Other Ambulatory Visit: Payer: Self-pay | Admitting: Family Medicine

## 2015-08-09 ENCOUNTER — Ambulatory Visit (INDEPENDENT_AMBULATORY_CARE_PROVIDER_SITE_OTHER): Payer: Medicare Other | Admitting: Pharmacist

## 2015-08-09 DIAGNOSIS — I4891 Unspecified atrial fibrillation: Secondary | ICD-10-CM | POA: Diagnosis not present

## 2015-08-09 DIAGNOSIS — I482 Chronic atrial fibrillation, unspecified: Secondary | ICD-10-CM

## 2015-08-09 DIAGNOSIS — Z5181 Encounter for therapeutic drug level monitoring: Secondary | ICD-10-CM

## 2015-08-09 LAB — POCT INR: INR: 3.3

## 2015-08-14 ENCOUNTER — Other Ambulatory Visit: Payer: Self-pay | Admitting: Cardiovascular Disease

## 2015-08-23 ENCOUNTER — Ambulatory Visit (INDEPENDENT_AMBULATORY_CARE_PROVIDER_SITE_OTHER): Payer: Medicare Other | Admitting: Family Medicine

## 2015-08-23 ENCOUNTER — Encounter: Payer: Self-pay | Admitting: Family Medicine

## 2015-08-23 VITALS — BP 132/82 | HR 51 | Temp 98.1°F | Ht 66.0 in | Wt 163.2 lb

## 2015-08-23 DIAGNOSIS — I10 Essential (primary) hypertension: Secondary | ICD-10-CM

## 2015-08-23 DIAGNOSIS — D649 Anemia, unspecified: Secondary | ICD-10-CM

## 2015-08-23 DIAGNOSIS — M109 Gout, unspecified: Secondary | ICD-10-CM | POA: Diagnosis not present

## 2015-08-23 DIAGNOSIS — D509 Iron deficiency anemia, unspecified: Secondary | ICD-10-CM

## 2015-08-23 LAB — CBC WITH DIFFERENTIAL/PLATELET
Basophils Absolute: 0 10*3/uL (ref 0.0–0.1)
Basophils Relative: 0 % (ref 0–1)
Eosinophils Absolute: 0.1 10*3/uL (ref 0.0–0.7)
Eosinophils Relative: 2 % (ref 0–5)
HEMATOCRIT: 35.3 % — AB (ref 39.0–52.0)
HEMOGLOBIN: 12.3 g/dL — AB (ref 13.0–17.0)
LYMPHS PCT: 47 % — AB (ref 12–46)
Lymphs Abs: 1.8 10*3/uL (ref 0.7–4.0)
MCH: 30.8 pg (ref 26.0–34.0)
MCHC: 34.8 g/dL (ref 30.0–36.0)
MCV: 88.3 fL (ref 78.0–100.0)
MONO ABS: 0.4 10*3/uL (ref 0.1–1.0)
MONOS PCT: 10 % (ref 3–12)
MPV: 9.3 fL (ref 8.6–12.4)
NEUTROS ABS: 1.6 10*3/uL — AB (ref 1.7–7.7)
Neutrophils Relative %: 41 % — ABNORMAL LOW (ref 43–77)
Platelets: 194 10*3/uL (ref 150–400)
RBC: 4 MIL/uL — ABNORMAL LOW (ref 4.22–5.81)
RDW: 15.2 % (ref 11.5–15.5)
WBC: 3.8 10*3/uL — ABNORMAL LOW (ref 4.0–10.5)

## 2015-08-23 NOTE — Patient Instructions (Addendum)
Checking blood counts today Referring to gout specialist. Someone will call you with appointment  See me in 3 months.   Be well, Dr. Ardelia Mems

## 2015-08-23 NOTE — Progress Notes (Signed)
Date of Visit: 08/23/2015   HPI:  Patient presents for routine follow up.  Gout - doing okay since last appointment where his knee was drained by Dr. Lonny Prude. Remains on colchicine. Unclear if he is on uloric - did not bring medications with him today. Has stopped eating red meat.   hypertension - denies shortness of breath. Had mild chest pain last week that lasted <2 mins ad was self resolved, not associated with shortness of breath.   Anemia - remains on iron. Denies blood in stool or lightheadedness. No shortness of breath.   ROS: See HPI.  [place]: history of CAD, hyperlipidemia, hypertension, gout, afib, tobacco use  PHYSICAL EXAM: BP 132/82 mmHg  Pulse 51  Temp(Src) 98.1 F (36.7 C) (Oral)  Ht 5' 6"  (1.676 m)  Wt 163 lb 3.2 oz (74.027 kg)  BMI 26.35 kg/m2 Gen: NAD, pleasant, cooperative HEENT: NCAT Heart: irregularly irregular rhythm, no murmur Lungs: clear to auscultation bilaterally, normal work of breathing  Neuro: alert, grossly nonfocal, speech normal Ext: knees without effusion or warmth today  ASSESSMENT/PLAN:  Gout Given frequency of gout flares, would benefit from evaluation by gout specialist. Patient agreeable to referral. Continue present regimen & referral placed.   HYPERTENSION, BENIGN ESSENTIAL Well controlled. Continue current regimen.   Anemia, iron deficiency Check CBC today to ensure stability.   FOLLOW UP: F/u in 3 mos for chronic medical problems Referring to gout clinic  [place] J. Ardelia Mems, [place]

## 2015-08-26 NOTE — Assessment & Plan Note (Signed)
Given frequency of gout flares, would benefit from evaluation by gout specialist. Patient agreeable to referral. Continue present regimen & referral placed.

## 2015-08-26 NOTE — Assessment & Plan Note (Signed)
Check CBC today to ensure stability.

## 2015-08-26 NOTE — Assessment & Plan Note (Signed)
Well-controlled.  Continue current regimen. 

## 2015-08-30 ENCOUNTER — Ambulatory Visit (INDEPENDENT_AMBULATORY_CARE_PROVIDER_SITE_OTHER): Payer: Medicare Other | Admitting: Pharmacist

## 2015-08-30 DIAGNOSIS — I482 Chronic atrial fibrillation, unspecified: Secondary | ICD-10-CM

## 2015-08-30 DIAGNOSIS — I4891 Unspecified atrial fibrillation: Secondary | ICD-10-CM | POA: Diagnosis not present

## 2015-08-30 DIAGNOSIS — Z5181 Encounter for therapeutic drug level monitoring: Secondary | ICD-10-CM

## 2015-08-30 LAB — POCT INR: INR: 2.1

## 2015-08-31 ENCOUNTER — Encounter: Payer: Self-pay | Admitting: Family Medicine

## 2015-09-09 DIAGNOSIS — M1A09X1 Idiopathic chronic gout, multiple sites, with tophus (tophi): Secondary | ICD-10-CM | POA: Diagnosis not present

## 2015-09-09 DIAGNOSIS — M2550 Pain in unspecified joint: Secondary | ICD-10-CM | POA: Diagnosis not present

## 2015-09-09 DIAGNOSIS — M79641 Pain in right hand: Secondary | ICD-10-CM | POA: Diagnosis not present

## 2015-09-09 DIAGNOSIS — M79642 Pain in left hand: Secondary | ICD-10-CM | POA: Diagnosis not present

## 2015-09-27 ENCOUNTER — Ambulatory Visit (INDEPENDENT_AMBULATORY_CARE_PROVIDER_SITE_OTHER): Payer: Medicare Other | Admitting: Pharmacist

## 2015-09-27 DIAGNOSIS — I482 Chronic atrial fibrillation, unspecified: Secondary | ICD-10-CM

## 2015-09-27 DIAGNOSIS — I4891 Unspecified atrial fibrillation: Secondary | ICD-10-CM | POA: Diagnosis not present

## 2015-09-27 DIAGNOSIS — Z5181 Encounter for therapeutic drug level monitoring: Secondary | ICD-10-CM | POA: Diagnosis not present

## 2015-09-27 LAB — POCT INR: INR: 2

## 2015-10-01 ENCOUNTER — Other Ambulatory Visit: Payer: Self-pay | Admitting: Family Medicine

## 2015-10-07 DIAGNOSIS — M79641 Pain in right hand: Secondary | ICD-10-CM | POA: Diagnosis not present

## 2015-10-07 DIAGNOSIS — M79642 Pain in left hand: Secondary | ICD-10-CM | POA: Diagnosis not present

## 2015-10-07 DIAGNOSIS — M1A09X1 Idiopathic chronic gout, multiple sites, with tophus (tophi): Secondary | ICD-10-CM | POA: Diagnosis not present

## 2015-10-07 DIAGNOSIS — M2550 Pain in unspecified joint: Secondary | ICD-10-CM | POA: Diagnosis not present

## 2015-10-11 ENCOUNTER — Other Ambulatory Visit: Payer: Self-pay | Admitting: *Deleted

## 2015-10-11 DIAGNOSIS — M109 Gout, unspecified: Secondary | ICD-10-CM

## 2015-10-12 MED ORDER — FEBUXOSTAT 40 MG PO TABS: 40.0000 mg | ORAL_TABLET | Freq: Every day | ORAL | Status: DC

## 2015-10-14 ENCOUNTER — Telehealth: Payer: Self-pay | Admitting: *Deleted

## 2015-10-14 NOTE — Telephone Encounter (Signed)
NP from [facility] Call program left voice message on nurse line stating patient had +1 protein in his urine.  She requested an appointment.  Call number back, left voice message to call nurse.  To check if patient had any other symptoms.  Derl Barrow, RN

## 2015-10-14 NOTE — Telephone Encounter (Signed)
NP from [facility] Call program called back, left message stating that patient has no symptoms and  no complaints. Only finding was 1+ protein in urine. Velora Heckler, RN

## 2015-10-15 NOTE — Telephone Encounter (Signed)
Defer management of proteinuria to PCP.

## 2015-10-19 NOTE — Telephone Encounter (Signed)
Patient has appointment scheduled. Will discuss when he's seen here. Leeanne Rio, MD

## 2015-10-20 ENCOUNTER — Telehealth: Payer: Self-pay | Admitting: Family Medicine

## 2015-10-20 NOTE — Telephone Encounter (Signed)
Left message on voicemail for patient to call back. 

## 2015-10-20 NOTE — Telephone Encounter (Signed)
Patient informed, he states that he finished his allopurinol and is now taking the uloric, states he didn't take the two together.

## 2015-10-20 NOTE — Telephone Encounter (Signed)
Received documentation from [facility] that patient has been started on allopurinol. I recently refilled his febuxostat (Uloric).  Red team, -please call patient and inform him that he should NOT take the uloric if he is on allopurinol. These medications act similarly and should not be used together.  Thanks, Leeanne Rio, MD

## 2015-10-25 ENCOUNTER — Ambulatory Visit (INDEPENDENT_AMBULATORY_CARE_PROVIDER_SITE_OTHER): Payer: Medicare Other | Admitting: *Deleted

## 2015-10-25 DIAGNOSIS — I4891 Unspecified atrial fibrillation: Secondary | ICD-10-CM

## 2015-10-25 DIAGNOSIS — Z5181 Encounter for therapeutic drug level monitoring: Secondary | ICD-10-CM

## 2015-10-25 DIAGNOSIS — I482 Chronic atrial fibrillation, unspecified: Secondary | ICD-10-CM

## 2015-10-25 LAB — POCT INR: INR: 2.8

## 2015-10-25 MED ORDER — WARFARIN SODIUM 5 MG PO TABS: ORAL_TABLET | ORAL | Status: DC

## 2015-11-16 ENCOUNTER — Ambulatory Visit (INDEPENDENT_AMBULATORY_CARE_PROVIDER_SITE_OTHER): Payer: Medicare Other | Admitting: Family Medicine

## 2015-11-16 ENCOUNTER — Encounter: Payer: Self-pay | Admitting: Family Medicine

## 2015-11-16 VITALS — BP 117/70 | HR 48 | Temp 97.9°F | Ht 66.0 in | Wt 169.0 lb

## 2015-11-16 DIAGNOSIS — R809 Proteinuria, unspecified: Secondary | ICD-10-CM | POA: Diagnosis not present

## 2015-11-16 DIAGNOSIS — M1A9XX0 Chronic gout, unspecified, without tophus (tophi): Secondary | ICD-10-CM

## 2015-11-16 DIAGNOSIS — D649 Anemia, unspecified: Secondary | ICD-10-CM

## 2015-11-16 DIAGNOSIS — D509 Iron deficiency anemia, unspecified: Secondary | ICD-10-CM

## 2015-11-16 LAB — POCT URINALYSIS DIPSTICK
Bilirubin, UA: NEGATIVE
Glucose, UA: NEGATIVE
Ketones, UA: NEGATIVE
NITRITE UA: NEGATIVE
PH UA: 6.5
SPEC GRAV UA: 1.01
UROBILINOGEN UA: 1

## 2015-11-16 LAB — CBC
HCT: 33.7 % — ABNORMAL LOW (ref 39.0–52.0)
HEMOGLOBIN: 11.6 g/dL — AB (ref 13.0–17.0)
MCH: 31.1 pg (ref 26.0–34.0)
MCHC: 34.4 g/dL (ref 30.0–36.0)
MCV: 90.3 fL (ref 78.0–100.0)
MPV: 10 fL (ref 8.6–12.4)
Platelets: 130 10*3/uL — ABNORMAL LOW (ref 150–400)
RBC: 3.73 MIL/uL — AB (ref 4.22–5.81)
RDW: 16.8 % — ABNORMAL HIGH (ref 11.5–15.5)
WBC: 3.3 10*3/uL — ABNORMAL LOW (ref 4.0–10.5)

## 2015-11-16 LAB — POCT UA - MICROSCOPIC ONLY

## 2015-11-16 NOTE — Patient Instructions (Signed)
Collect 24 hour urine sample for [place] & bring back to the clinic  Checking kidneys & blood counts today  STOP Uloric (febuxostat). Talk to your gout doctor about whether to take allopurinol or the uloric. For now, just do allopurinol  See me in 3 months  Be well, Dr. Ardelia Mems

## 2015-11-16 NOTE — Progress Notes (Signed)
Date of Visit: 11/16/2015   HPI:  Patient presents for routine follow up.  Gout - has seen gout specialist. Brought all his medications with him today and seems to be taking both allopurinol and uloric. Will see gout doctor later this month. I previously had him on uloric, and gout physician put him on allouprinol (I think not realizing he had rx already for uloric). Gout is doing well now. Has mild chronic swelling of RLE. Also takes colchicine twice daily.   Proteinuria - noted recently during home visit from his insurance that he had 1+ protein in his urine. Denies any problem with urine.   Anemia - taking iron daily  ROS: See HPI. Denies chest pain, shortness of breath, dizziness.   [place]: history of gout, iron deiciency anemia, atrial fibrillation on coumadin, CAD, BPH, depression, GERD, herpes, hyperlipidemia, hypertension, tobacco abuse  PHYSICAL EXAM: BP 117/70 mmHg  Pulse 48  Temp(Src) 97.9 F (36.6 C) (Oral)  Ht 5' 6"  (1.676 m)  Wt 169 lb (76.658 kg)  BMI 27.29 kg/m2 Gen: NAD, pleasant, cooperative HEENT: normocephalic, atraumatic, MMM Heart: regular rate and rhythm, no murmur Lungs: clear to auscultation bilaterally, normal work of breathing  Neuro: grossly nonfocal, speech normal Ext: bilateral knees without effusion or warmth. Very faint chronic swelling of RLE, no tenderness or erythema or warmth  ASSESSMENT/PLAN:  Health maintenance: - declined zostavax & flu vaccine today  Anemia, iron deficiency Check CBC again today to monitor stability of hgb.   Proteinuria Newly noted on recent home visit from insurance. Still with protein today. Will check 24 hour urine protein to quantify. Also check BMET.   Gout Educated patient not to take both uloric and allopurinol. He will stop uloric and stay on allopurinol for now. Encouraged him to tell his gout provider that he had been taking both.   FOLLOW UP: Follow up in 3 months for chronic medical  problems  [place] J. Ardelia Mems, [place]

## 2015-11-17 LAB — BASIC METABOLIC PANEL
BUN: 6 mg/dL — ABNORMAL LOW (ref 7–25)
CALCIUM: 9.2 mg/dL (ref 8.6–10.3)
CO2: 23 mmol/L (ref 20–31)
Chloride: 109 mmol/L (ref 98–110)
Creat: 1 mg/dL (ref 0.70–1.25)
Glucose, Bld: 66 mg/dL (ref 65–99)
POTASSIUM: 3.9 mmol/L (ref 3.5–5.3)
SODIUM: 142 mmol/L (ref 135–146)

## 2015-11-19 DIAGNOSIS — R809 Proteinuria, unspecified: Secondary | ICD-10-CM | POA: Insufficient documentation

## 2015-11-19 NOTE — Assessment & Plan Note (Signed)
Newly noted on recent home visit from insurance. Still with protein today. Will check 24 hour urine protein to quantify. Also check BMET.

## 2015-11-19 NOTE — Assessment & Plan Note (Signed)
Educated patient not to take both uloric and allopurinol. He will stop uloric and stay on allopurinol for now. Encouraged him to tell his gout provider that he had been taking both.

## 2015-11-19 NOTE — Assessment & Plan Note (Signed)
Check CBC again today to monitor stability of hgb.

## 2015-12-06 ENCOUNTER — Ambulatory Visit (INDEPENDENT_AMBULATORY_CARE_PROVIDER_SITE_OTHER): Payer: Medicare Other | Admitting: *Deleted

## 2015-12-06 DIAGNOSIS — I482 Chronic atrial fibrillation, unspecified: Secondary | ICD-10-CM

## 2015-12-06 DIAGNOSIS — I4891 Unspecified atrial fibrillation: Secondary | ICD-10-CM

## 2015-12-06 DIAGNOSIS — Z5181 Encounter for therapeutic drug level monitoring: Secondary | ICD-10-CM

## 2015-12-06 DIAGNOSIS — R809 Proteinuria, unspecified: Secondary | ICD-10-CM | POA: Diagnosis not present

## 2015-12-06 LAB — POCT INR: INR: 2.4

## 2015-12-06 NOTE — Addendum Note (Signed)
Addended by: Lianne Bushy on: 12/06/2015 10:36 AM   Modules accepted: Orders

## 2015-12-07 LAB — PROTEIN, URINE, 24 HOUR
PROTEIN 24H UR: 215 mg/(24.h) — AB (ref <150)
PROTEIN, URINE: 43 mg/dL — AB (ref 5–25)

## 2015-12-16 ENCOUNTER — Telehealth: Payer: Self-pay | Admitting: Family Medicine

## 2015-12-16 DIAGNOSIS — R809 Proteinuria, unspecified: Secondary | ICD-10-CM

## 2015-12-16 NOTE — Telephone Encounter (Signed)
Called patient to discuss results 24h protein is less than 300 but still abnormally high In light of the other kidney issues he's had in the past, warrants nephrology referral to get established Explained this to patient, and advised someone will call him with an appointment  Patient appreciative, questions answered. Leeanne Rio, MD

## 2015-12-21 ENCOUNTER — Other Ambulatory Visit: Payer: Self-pay | Admitting: Cardiovascular Disease

## 2015-12-31 ENCOUNTER — Telehealth: Payer: Self-pay | Admitting: *Deleted

## 2015-12-31 NOTE — Telephone Encounter (Signed)
Jesse Franklin, a pharmacist called and stated that the clopidogrel should not be taken with nexium. They want to be sure that Dr Acie Fredrickson is aware of this. Please advise. Thanks, MI

## 2016-01-03 ENCOUNTER — Encounter: Payer: Self-pay | Admitting: Cardiovascular Disease

## 2016-01-03 ENCOUNTER — Ambulatory Visit (INDEPENDENT_AMBULATORY_CARE_PROVIDER_SITE_OTHER): Payer: Medicare Other | Admitting: Cardiovascular Disease

## 2016-01-03 VITALS — BP 110/70 | HR 48 | Ht 66.0 in | Wt 172.8 lb

## 2016-01-03 DIAGNOSIS — E7800 Pure hypercholesterolemia, unspecified: Secondary | ICD-10-CM | POA: Diagnosis not present

## 2016-01-03 DIAGNOSIS — I482 Chronic atrial fibrillation, unspecified: Secondary | ICD-10-CM

## 2016-01-03 DIAGNOSIS — I25119 Atherosclerotic heart disease of native coronary artery with unspecified angina pectoris: Secondary | ICD-10-CM

## 2016-01-03 NOTE — Telephone Encounter (Signed)
We should DC Nexium and start Protonix 40 mg a day

## 2016-01-03 NOTE — Patient Instructions (Addendum)
Medication Instructions:  Your physician has sent a note to your medical doctor regarding switching from Nexium to Protonix since you are on Clopidegrel  Labwork: None Ordered   Testing/Procedures: None Ordered   Follow-Up: Your physician wants you to follow-up in: 1 year with Dr. Acie Fredrickson.  You will receive a reminder letter in the mail two months in advance. If you don't receive a letter, please call our office to schedule the follow-up appointment.   If you need a refill on your cardiac medications before your next appointment, please call your pharmacy.   Thank you for choosing [facility]! Christen Bame, RN [phone]

## 2016-01-03 NOTE — Telephone Encounter (Signed)
Patient came in for office visit today.  Dr. Acie Fredrickson states he will contact Dr. Ardelia Mems, the patient's PCP to advise that Nexium should be changed to Protonix.  The patient verbalized understanding of this information as well.

## 2016-01-03 NOTE — Progress Notes (Signed)
Cardiology Office Note   Date:  01/03/2016   ID:  Jesse Franklin, DOB [birth year], MRN [id number]  PCP:  Chrisandra Netters, MD  Cardiologist:   Acie Fredrickson Wonda Cheng, MD   Chief Complaint  Patient presents with  . Follow-up   1. Atrial fibrillation -  2. Coronary artery disease RESULTS: 08/30/04 1. Left main: Normal.  2. LAD: Large vessel, tortuous in the mid and distal section with mild  luminal irregularities. The extreme distal portion of the LAD after the  trifurcation of the distal vessel has a subtotal occlusion at the  trifurcation tip. Vessel less than 0.5 mm at this juncture.  3. LXC: Codominant with mild luminal irregularities.  4. OM1 and OM2: Large vessels with mild luminal irregularities.  5. RCA: Tortuous, codominant with mild luminal irregularities, very faint  right to left filling collaterals seen at the apex.  6. LV: EF 50-55% with mild inferior apical hypokinesis. LVEDP was 35  mmHg.  7. Abdominal/distal aortogram showed mild tortuosity. No significant  aneurysms were seen. The distal aorta showed mild splaying to the left  at the area of the iliac bifurcation. No significant iliac or femoral  disease was noted.  3. Hypertension 4. 6. Moderate pulmonary hypertension by echo in 2010 ( ongoing cigarette smoking)  5. Hyperlipidemia   History of Present Illness:  Jesse Franklin is a 65 yo with hx as noted above. He has occasional episodes of left arm tingling. These episodes last 1-2 seconds. They're not necessarily associated with exertion. The tingling seems to get better with arm movement. He also has occasional episodes of shortness of breath.  He walks on a regular basis. He walks approximately 1 mile a day. He still eats some salt and also eats fried and fast foods.  December 02, 2013:  Jesse Franklin is doing well. He denies any chest pain or shortness of breath. He walks about 15-20 minutes each day. He no longer works. He has cut down on his salt  usage.    [name] 5, 2016:   Jesse Franklin is a 65 y.o. male who presents for follow-up of his atrial fibrillation. He also has a history of hypertension  Still smoking  -   Nov. 1, 2016:  Doing well from a cardiac standpoint.  Having gout issues  in left elbow and hand.  No Cp or dyspnea.  Still smoking   January 03, 2016:    Jesse Franklin is seen today  No CP , some back pain  Still smoking    Past Medical History  Diagnosis Date  . Anemia   . Hypertension   . Gout   . Atrial fibrillation ([place])   . GERD (gastroesophageal reflux disease)   . Hyperlipidemia   . Herpes   . RHINITIS, ALLERGIC 11/08/2006  . Herpes genitalia 04/13/2011  . CAD (coronary artery disease)     [place]  . Tobacco abuse   . Alcohol abuse   . Cocaine abuse     Hx of  . BPH 11/08/2006  . Myocardial infarction [facility]) 2005    Past Surgical History  Procedure Laterality Date  . Laceration repair Right 03/1988    "hand"  . Cardiac catheterization  08/2004     Current Outpatient Prescriptions  Medication Sig Dispense Refill  . allopurinol (ZYLOPRIM) 100 MG tablet Take 100 mg by mouth daily.    Marland Kitchen amLODipine (NORVASC) 10 MG tablet TAKE 1 TABLET (10 MG TOTAL) BY MOUTH DAILY. 90 tablet 3  . calcium carbonate (  TUMS) 500 MG chewable tablet Chew 1 tablet (200 mg of elemental calcium total) by mouth daily. 30 tablet 0  . clopidogrel (PLAVIX) 75 MG tablet TAKE 1 TABLET BY MOUTH EVERY DAY 30 tablet 5  . colchicine 0.6 MG tablet Take 1 tablet (0.6 mg total) by mouth 2 (two) times daily. 180 tablet 3  . CRESTOR 40 MG tablet TAKE 1 TABLET BY MOUTH EVERY DAY 90 tablet 3  . febuxostat (ULORIC) 40 MG tablet Take 1 tablet (40 mg total) by mouth daily. 30 tablet 5  . ferrous sulfate 325 (65 FE) MG tablet TAKE 1 TABLET BY MOUTH EVERY DAY WITH BREAKFAST 30 tablet 11  . nitroGLYCERIN (NITROSTAT) 0.4 MG SL tablet Place 1 tablet (0.4 mg total) under the tongue as directed. Place 1 tab under tongue as directed 25 tablet 6  .  warfarin (COUMADIN) 5 MG tablet Take as directed by coumadin clinic 135 tablet 0   No current facility-administered medications for this visit.   Facility-Administered Medications Ordered in Other Visits  Medication Dose Route Frequency Provider Last Rate Last Dose  . 0.9 %  sodium chloride infusion   Intravenous Continuous Willeen Niece, MD 125 mL/hr at 07/31/14 1200      Allergies:   Codeine and Ketorolac tromethamine    Social History:  The patient  reports that he has been smoking Cigarettes.  He has a 4.8 pack-year smoking history. He quit smokeless tobacco use about 48 years ago. His smokeless tobacco use included Chew. He reports that he uses illicit drugs ("Crack" cocaine and Marijuana). He reports that he does not drink alcohol.   Family History:  The patient's family history includes Alcohol abuse in his brother and father; Cancer in his sister; Gout in his mother; Heart disease in his brother; Hypertension in his mother.    ROS:  Please see the history of present illness.    Review of Systems: Constitutional:  denies fever, chills, diaphoresis, appetite change and fatigue.  HEENT: denies photophobia, eye pain, redness, hearing loss, ear pain, congestion, sore throat, rhinorrhea, sneezing, neck pain, neck stiffness and tinnitus.  Respiratory: denies SOB, DOE, cough, chest tightness, and wheezing.  Cardiovascular: denies chest pain, palpitations and leg swelling.  Gastrointestinal: denies nausea, vomiting, abdominal pain, diarrhea, constipation, blood in stool.  Genitourinary: denies dysuria, urgency, frequency, hematuria, flank pain and difficulty urinating.  Musculoskeletal: denies  myalgias, back pain, joint swelling, arthralgias and gait problem.   Skin: denies pallor, rash and wound.  Neurological: denies dizziness, seizures, syncope, weakness, light-headedness, numbness and headaches.   Hematological: denies adenopathy, easy bruising, personal or family bleeding history.    Psychiatric/ Behavioral: denies suicidal ideation, mood changes, confusion, nervousness, sleep disturbance and agitation.       All other systems are reviewed and negative.    PHYSICAL EXAM: VS:  BP 110/70 mmHg  Pulse 48  Ht 5' 6"  (1.676 m)  Wt 172 lb 12.8 oz (78.382 kg)  BMI 27.90 kg/m2 , BMI Body mass index is 27.9 kg/(m^2). GEN: Well nourished, well developed, in no acute distress HEENT: normal Neck: no JVD, carotid bruits, or masses Cardiac: Irreg. Irreg. ;  Soft systolic murmur and soft diastolic murmur , no  rubs, or gallops,no edema  Respiratory:  clear to auscultation bilaterally, normal work of breathing GI: soft, nontender, nondistended, + BS MS: no deformity or atrophy Skin: warm and dry, no rash Neuro:  Strength and sensation are intact Psych: normal   EKG:  EKG is ordered today. The  ekg ordered today demonstrates :  Atrial fib at rate of 59.  TWI in the lateral leads.   No changes from previous tracing    Recent Labs: 03/08/2015: Magnesium 1.8 07/13/2015: ALT 28 11/16/2015: BUN 6*; Creat 1.00; Hemoglobin 11.6*; Platelets 130*; Potassium 3.9; Sodium 142    Lipid Panel    Component Value Date/Time   CHOL 133 07/13/2015 0959   TRIG 84 07/13/2015 0959   HDL 39* 07/13/2015 0959   CHOLHDL 3.4 07/13/2015 0959   VLDL 17 07/13/2015 0959   LDLCALC 77 07/13/2015 0959   LDLDIRECT 95 05/28/2012 0925      Wt Readings from Last 3 Encounters:  01/03/16 172 lb 12.8 oz (78.382 kg)  11/16/15 169 lb (76.658 kg)  08/23/15 163 lb 3.2 oz (74.027 kg)      Other studies Reviewed: Additional studies/ records that were reviewed today include: . Review of the above records demonstrates:    ASSESSMENT AND PLAN:  1. Atrial fibrillation-  Atrial fib with slow ventricular response.   HR of 49.   LVH with repol changes.   2. Coronary artery disease RESULTS: 08/30/04 1. Left main: Normal.  2. LAD: Large vessel, tortuous in the mid and distal section with mild  luminal  irregularities. The extreme distal portion of the LAD after the  trifurcation of the distal vessel has a subtotal occlusion at the  trifurcation tip. Vessel less than 0.5 mm at this juncture.  3. LXC: Codominant with mild luminal irregularities.  4. OM1 and OM2: Large vessels with mild luminal irregularities.  5. RCA: Tortuous, codominant with mild luminal irregularities, very faint  right to left filling collaterals seen at the apex.  6. LV: EF 50-55% with mild inferior apical hypokinesis. LVEDP was 35  mmHg.  7. Abdominal/distal aortogram showed mild tortuosity. No significant  aneurysms were seen. The distal aorta showed mild splaying to the left  at the area of the iliac bifurcation. No significant iliac or femoral  disease was noted.  3. Hypertension - BP is ok   4.  Moderate pulmonary hypertension by echo in 2010 ( ongoing cigarette smoking) - encouraged him to stop 5. Hyperlipidemia - we'll check labs today.   Current medicines are reviewed at length with the patient today.  The patient does not have concerns regarding medicines.  The following changes have been made:  no change  Labs/ tests ordered today include:  No orders of the defined types were placed in this encounter.     Disposition:   FU with me in 6 months      Nahser, Wonda Cheng, MD  01/03/2016 3:23 PM    [facility] [place], [place], [place]  [address] Phone: [phone]; Fax: [phone]   [facility]  [address] [place], [place]  [address] [phone]    Fax [phone]

## 2016-01-05 NOTE — Addendum Note (Signed)
Addended by: Freada Bergeron on: 01/05/2016 05:06 PM   Modules accepted: Orders

## 2016-01-06 ENCOUNTER — Ambulatory Visit: Payer: Medicare Other | Admitting: Family Medicine

## 2016-01-07 ENCOUNTER — Encounter: Payer: Self-pay | Admitting: Family Medicine

## 2016-01-07 ENCOUNTER — Ambulatory Visit (INDEPENDENT_AMBULATORY_CARE_PROVIDER_SITE_OTHER): Payer: Medicare Other | Admitting: Family Medicine

## 2016-01-07 ENCOUNTER — Telehealth: Payer: Self-pay | Admitting: Family Medicine

## 2016-01-07 VITALS — BP 123/80 | HR 80 | Temp 98.1°F | Ht 66.0 in | Wt 170.0 lb

## 2016-01-07 DIAGNOSIS — K219 Gastro-esophageal reflux disease without esophagitis: Secondary | ICD-10-CM | POA: Diagnosis not present

## 2016-01-07 MED ORDER — PANTOPRAZOLE SODIUM 40 MG PO TBEC: 40.0000 mg | DELAYED_RELEASE_TABLET | Freq: Every day | ORAL | Status: DC

## 2016-01-07 NOTE — Progress Notes (Signed)
Date of Visit: 01/07/2016   HPI:  Patient presents to follow up on acid reflux.  Seen by his cardiologist recently. Was told to stop nexium due to it interacting with plavix. Has gone without PPI for the last few days and has had severe reflux since then. Lots of heartburn and regurgitation. No blood in stool.  Of note I referred him to a kidney doctor but he hasn't heard about that appointment yet.   ROS: See HPI.  [place]: history of gout, hypertension, CAD, hyperlipidemia, atrial fibrillation on coumadin, tobacco abuse, GERD, proteinuria  PHYSICAL EXAM: BP 123/80 mmHg  Pulse 80  Temp(Src) 98.1 F (36.7 C) (Oral)  Ht 5' 6"  (1.676 m)  Wt 170 lb (77.111 kg)  BMI 27.45 kg/m2 Gen: NAD, pleasant, cooperative HEENT: normocephalic, atraumatic, moist mucous membranes  Abdomen: soft nontender to palpation, no masses or organomegaly Neuro: alert, grossly nonfocal, speech normal  ASSESSMENT/PLAN:  GASTROESOPHAGEAL REFLUX, NO ESOPHAGITIS Increased symptoms due to stopping nexium.  rx protonix, ok with plavix per Dr. Acie Fredrickson Follow up if not improving with protonix.  advised patient will hear about nephrology appointment within the next few months, not an urgent appointment.  FOLLOW UP: Follow up as needed if symptoms worsen or fail to improve.    [place]. Ardelia Mems, [place]

## 2016-01-07 NOTE — Telephone Encounter (Signed)
Red team,  Can you call patient and let him know that his cardiologist would like me to switch his acid reflux medicine from nexium to protonix?  I've sent in a new prescription. These medicines work the same, but protonix is a better match for his other medications. If he has questions I'm happy to speak with him.  Thanks, Leeanne Rio, MD

## 2016-01-07 NOTE — Telephone Encounter (Signed)
Red team Disregard prior message. I see that patient has an appointment scheduled this morning with me :)  Leeanne Rio, MD

## 2016-01-07 NOTE — Patient Instructions (Addendum)
Sent in new acid reflux medicine Let me know if this does not improve your symptoms  You should hear about the kidney doctor appointment some time in the next few months   Be well, Dr. Ardelia Mems

## 2016-01-09 ENCOUNTER — Other Ambulatory Visit: Payer: Self-pay | Admitting: Family Medicine

## 2016-01-10 NOTE — Assessment & Plan Note (Signed)
Increased symptoms due to stopping nexium.  rx protonix, ok with plavix per Dr. Acie Fredrickson Follow up if not improving with protonix.

## 2016-01-17 ENCOUNTER — Ambulatory Visit (INDEPENDENT_AMBULATORY_CARE_PROVIDER_SITE_OTHER): Payer: Medicare Other | Admitting: Pharmacist

## 2016-01-17 DIAGNOSIS — Z5181 Encounter for therapeutic drug level monitoring: Secondary | ICD-10-CM

## 2016-01-17 DIAGNOSIS — I482 Chronic atrial fibrillation, unspecified: Secondary | ICD-10-CM

## 2016-01-17 DIAGNOSIS — I4891 Unspecified atrial fibrillation: Secondary | ICD-10-CM | POA: Diagnosis not present

## 2016-01-17 LAB — POCT INR: INR: 2.4

## 2016-01-19 DIAGNOSIS — H40033 Anatomical narrow angle, bilateral: Secondary | ICD-10-CM | POA: Diagnosis not present

## 2016-01-19 DIAGNOSIS — H04123 Dry eye syndrome of bilateral lacrimal glands: Secondary | ICD-10-CM | POA: Diagnosis not present

## 2016-01-21 ENCOUNTER — Other Ambulatory Visit: Payer: Self-pay

## 2016-01-21 MED ORDER — WARFARIN SODIUM 5 MG PO TABS: ORAL_TABLET | ORAL | Status: DC

## 2016-01-27 DIAGNOSIS — R809 Proteinuria, unspecified: Secondary | ICD-10-CM | POA: Diagnosis not present

## 2016-01-27 DIAGNOSIS — I10 Essential (primary) hypertension: Secondary | ICD-10-CM | POA: Diagnosis not present

## 2016-01-30 ENCOUNTER — Other Ambulatory Visit: Payer: Self-pay | Admitting: Cardiovascular Disease

## 2016-02-28 ENCOUNTER — Ambulatory Visit (INDEPENDENT_AMBULATORY_CARE_PROVIDER_SITE_OTHER): Payer: Medicare Other | Admitting: *Deleted

## 2016-02-28 DIAGNOSIS — Z5181 Encounter for therapeutic drug level monitoring: Secondary | ICD-10-CM

## 2016-02-28 DIAGNOSIS — I482 Chronic atrial fibrillation, unspecified: Secondary | ICD-10-CM

## 2016-02-28 DIAGNOSIS — I4891 Unspecified atrial fibrillation: Secondary | ICD-10-CM | POA: Diagnosis not present

## 2016-02-28 LAB — POCT INR: INR: 2.6

## 2016-02-29 DIAGNOSIS — M25561 Pain in right knee: Secondary | ICD-10-CM | POA: Diagnosis not present

## 2016-02-29 DIAGNOSIS — M1A09X1 Idiopathic chronic gout, multiple sites, with tophus (tophi): Secondary | ICD-10-CM | POA: Diagnosis not present

## 2016-02-29 DIAGNOSIS — M2550 Pain in unspecified joint: Secondary | ICD-10-CM | POA: Diagnosis not present

## 2016-04-04 DIAGNOSIS — M1A09X1 Idiopathic chronic gout, multiple sites, with tophus (tophi): Secondary | ICD-10-CM | POA: Diagnosis not present

## 2016-04-04 DIAGNOSIS — M2550 Pain in unspecified joint: Secondary | ICD-10-CM | POA: Diagnosis not present

## 2016-04-10 ENCOUNTER — Ambulatory Visit (INDEPENDENT_AMBULATORY_CARE_PROVIDER_SITE_OTHER): Payer: Medicare Other | Admitting: *Deleted

## 2016-04-10 DIAGNOSIS — Z5181 Encounter for therapeutic drug level monitoring: Secondary | ICD-10-CM

## 2016-04-10 DIAGNOSIS — I4891 Unspecified atrial fibrillation: Secondary | ICD-10-CM

## 2016-04-10 LAB — POCT INR: INR: 2.7

## 2016-04-11 ENCOUNTER — Other Ambulatory Visit: Payer: Self-pay | Admitting: Cardiovascular Disease

## 2016-04-17 ENCOUNTER — Ambulatory Visit (INDEPENDENT_AMBULATORY_CARE_PROVIDER_SITE_OTHER): Payer: Medicare Other | Admitting: Family Medicine

## 2016-04-17 ENCOUNTER — Encounter: Payer: Self-pay | Admitting: Family Medicine

## 2016-04-17 VITALS — BP 127/61 | HR 50 | Temp 97.9°F | Ht 66.0 in | Wt 173.0 lb

## 2016-04-17 DIAGNOSIS — D649 Anemia, unspecified: Secondary | ICD-10-CM | POA: Diagnosis not present

## 2016-04-17 DIAGNOSIS — I10 Essential (primary) hypertension: Secondary | ICD-10-CM | POA: Diagnosis not present

## 2016-04-17 DIAGNOSIS — D509 Iron deficiency anemia, unspecified: Secondary | ICD-10-CM | POA: Diagnosis not present

## 2016-04-17 DIAGNOSIS — F17200 Nicotine dependence, unspecified, uncomplicated: Secondary | ICD-10-CM

## 2016-04-17 LAB — IRON AND TIBC
%SAT: 37 % (ref 15–60)
Iron: 109 ug/dL (ref 50–180)
TIBC: 291 ug/dL (ref 250–425)
UIBC: 182 ug/dL (ref 125–400)

## 2016-04-17 LAB — CBC
HCT: 37.8 % — ABNORMAL LOW (ref 38.5–50.0)
HEMOGLOBIN: 13 g/dL — AB (ref 13.2–17.1)
MCH: 31.1 pg (ref 27.0–33.0)
MCHC: 34.4 g/dL (ref 32.0–36.0)
MCV: 90.4 fL (ref 80.0–100.0)
MPV: 10.3 fL (ref 7.5–12.5)
Platelets: 147 10*3/uL (ref 140–400)
RBC: 4.18 MIL/uL — ABNORMAL LOW (ref 4.20–5.80)
RDW: 14.3 % (ref 11.0–15.0)
WBC: 3.8 10*3/uL (ref 3.8–10.8)

## 2016-04-17 LAB — FERRITIN: FERRITIN: 174 ng/mL (ref 20–380)

## 2016-04-17 NOTE — Progress Notes (Signed)
Date of Visit: 04/17/2016   HPI:  Patient presents for follow up.   Hypertension - currently taking amlodipine for hypertension. Denies chest pain, shortness of breath.   Gout - seeing gout clinic regularly. On allopurinol and colchicine. Well controlled, no recent flares.  Anemia - due for cbc follow up to ensure anemia resolved.  No bleeding.  Smoking - smokes both cigarettes and marijuana. Willing to try to quit cigarettes, less willing to quit marijuana. Gets bored at home. Likes having people to talk to. Reports prior history when he was younger of hearing voices, but none at all recently.  ROS: See HPI.  [place]: history of gout, hypertension, hyperlipidemia, CAD, afib on coumadin, tobacco abuse  PHYSICAL EXAM: BP 127/61 (BP Location: Left Arm, Patient Position: Sitting, Cuff Size: Normal)   Pulse (!) 50   Temp 97.9 F (36.6 C) (Oral)   Ht 5' 6"  (1.676 m)   Wt 173 lb (78.5 kg)   BMI 27.92 kg/m  Gen: NAD, pleasant, cooperative HEENT: normocephalic, atraumatic, moist mucous membranes  Heart: regular rate and rhythm, no murmur Lungs: clear to auscultation bilaterally normal work of breathing  Neuro: alert grossly nonfocal Ext: No appreciable lower extremity edema bilaterally   ASSESSMENT/PLAN:  Health maintenance:  -advised to schedule annual wellness visit  Anemia, iron deficiency Recheck cbc & iron studies today to ensure resolution/stability  HYPERTENSION, BENIGN ESSENTIAL Well controlled. Continue current regimen.   TOBACCO DEPENDENCE Again encouraged smoking cessation of both tobacco and THC. Given 1800quitnow phone number for assistance with quitting tobacco.  Given handout/info on local senior center so he can get involved in social activities.  FOLLOW UP: Follow up in 4-6 months for regular medical problems Schedule AWV  [place] J. Ardelia Mems, [place]

## 2016-04-17 NOTE — Patient Instructions (Signed)
Schedule an annual wellness visit with Jesse Franklin or Jesse Franklin. This is a longer visit to focus on your wellness goals and how to keep you healthy. It is free.   Checking labwork today to check your blood counts  Call 1800-QUIT-NOW for help with stopping smoking.   See handout on the [place] - they have some good activities you could participate in.  See me in 4-6 months Be well, Dr. Ardelia Mems

## 2016-04-19 NOTE — Assessment & Plan Note (Signed)
Well-controlled.  Continue current regimen. 

## 2016-04-19 NOTE — Assessment & Plan Note (Signed)
Recheck cbc & iron studies today to ensure resolution/stability

## 2016-04-19 NOTE — Assessment & Plan Note (Signed)
Again encouraged smoking cessation of both tobacco and THC. Given 1800quitnow phone number for assistance with quitting tobacco.

## 2016-04-21 ENCOUNTER — Encounter: Payer: Self-pay | Admitting: Family Medicine

## 2016-04-30 ENCOUNTER — Other Ambulatory Visit: Payer: Self-pay | Admitting: Family Medicine

## 2016-05-16 ENCOUNTER — Ambulatory Visit (INDEPENDENT_AMBULATORY_CARE_PROVIDER_SITE_OTHER): Payer: Medicare Other | Admitting: *Deleted

## 2016-05-16 ENCOUNTER — Encounter: Payer: Self-pay | Admitting: *Deleted

## 2016-05-16 VITALS — BP 128/58 | HR 46 | Temp 98.0°F | Ht 66.5 in | Wt 176.8 lb

## 2016-05-16 DIAGNOSIS — Z0000 Encounter for general adult medical examination without abnormal findings: Secondary | ICD-10-CM

## 2016-05-16 NOTE — Progress Notes (Signed)
Subjective:   Jesse Franklin is a 65 y.o. male who presents for Medicare Annual/Subsequent preventive examination.  Cardiac Risk Factors include: advanced age (>51mn, >>74women);dyslipidemia;hypertension;male gender;obesity (BMI >30kg/m2);sedentary lifestyle;smoking/ tobacco exposure     Objective:    Vitals: BP (!) 128/58 (BP Location: Left Arm, Patient Position: Sitting, Cuff Size: Normal)   Pulse (!) 46 Comment: Manually  Temp 98 F (36.7 C) (Oral)   Ht 5' 6.5" (1.689 m)   Wt 176 lb 12.8 oz (80.2 kg)   SpO2 100%   BMI 28.11 kg/m   Body mass index is 28.11 kg/m.  Tobacco History  Smoking Status  . Current Every Day Smoker  . Packs/day: 0.10  . Years: 48.00  . Types: Cigarettes  Smokeless Tobacco  . 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 Quit date: 09/12/1967    Comment: smoking 3-4 cigs/day     Ready to quit: Yes Counseling given: Yes   Past Medical History:  Diagnosis Date  . Alcohol abuse   . Anemia   [place]   . BPH 11/08/2006  . CAD (coronary artery disease)    [place]  . Cocaine abuse    Hx of  . GERD (gastroesophageal reflux disease)   . Gout   . Herpes   . Herpes genitalia 04/13/2011  . Hyperlipidemia   . Hypertension   . [place]  . RHINITIS, ALLERGIC 11/08/2006  . Tobacco abuse    Past Surgical History:  Procedure Laterality Date  . CARDIAC CATHETERIZATION  08/2004  . LACERATION REPAIR Right 03/1988   "hand"   Family History  Problem Relation Age of Onset  . Alcohol abuse Father   . Heart disease Brother   . Alcohol abuse Brother   . Cancer Sister   . Hypertension Mother   . Gout Mother   . Arthritis Brother   . Hypertension Sister    History  Sexual Activity  . Sexual activity: Not Currently    Outpatient Encounter Prescriptions as of 05/16/2016  Medication Sig  . allopurinol (ZYLOPRIM) 100 MG tablet Take 100 mg by mouth 2 (two) times daily.   .Marland KitchenamLODipine (NORVASC) 10 MG tablet TAKE  1 TABLET (10 MG TOTAL) BY MOUTH DAILY.  .Marland Kitchenclopidogrel (PLAVIX) 75 MG tablet TAKE 1 TABLET BY MOUTH EVERY DAY  . colchicine 0.6 MG tablet Take 1 tablet (0.6 mg total) by mouth 2 (two) times daily.  . CRESTOR 40 MG tablet TAKE 1 TABLET BY MOUTH EVERY DAY  . ferrous sulfate 325 (65 FE) MG tablet TAKE 1 TABLET BY MOUTH EVERY DAY WITH BREAKFAST  . nitroGLYCERIN (NITROSTAT) 0.4 MG SL tablet Place 1 tablet (0.4 mg total) under the tongue as directed. Place 1 tab under tongue as directed  . pantoprazole (PROTONIX) 40 MG tablet TAKE 1 TABLET BY MOUTH DAILY  . warfarin (COUMADIN) 5 MG tablet Take as directed by coumadin clinic  . warfarin (COUMADIN) 5 MG tablet TAKE AS DIRECTED BY ANTICOAGULATION CLINIC   Facility-Administered Encounter Medications as of 05/16/2016  Medication  . 0.9 %  sodium chloride infusion    Activities of Daily Living In your present state of health, do you have any difficulty performing the following activities: 05/16/2016  Hearing? N  Vision? Y  Difficulty concentrating or making decisions? Y  Walking or climbing stairs? N  Dressing or bathing? N  Doing errands, shopping? N  Preparing Food and eating ? N  Using the Toilet? N  In the past six months, have you accidently leaked urine? N  Do you have problems with loss of bowel control? N  Managing your Medications? N  Managing your Finances? N  Housekeeping or managing your Housekeeping? Y  Some recent data might be hidden  Home Safety:  My home has a working smoke alarm:  Yes X 2           My home throw rugs have been fastened down to the floor or removed:  Fastened down I have non-slip mats in the bathtub and shower:  Yes         All my home's stairs have railings or bannisters: two level home with railings         My home's floors, stairs and hallways are free from clutter, wires and cords:  Yes       Patient Care Team: Leeanne Rio, MD as PCP - General (Pediatrics) Thayer Headings, MD (Cardiology)     Assessment:     Exercise Activities and Dietary recommendations Current Exercise Habits: Home exercise routine, Type of exercise: walking, Time (Minutes): 15, Frequency (Times/Week): 7, Weekly Exercise (Minutes/Week): 105, Intensity: Moderate  Goals    . Blood Pressure < 140/90    . Quit smoking / using tobacco      Fall Risk Fall Risk  05/16/2016 01/07/2016 11/16/2015 03/18/2015 02/11/2015  Falls in the past year? No No No No No  Risk for fall due to : - - - - -   Depression Screen PHQ 2/9 Scores 05/16/2016 01/07/2016 11/16/2015 08/23/2015  PHQ - 2 Score 2 0 0 0  PHQ- 9 Score 9 - - -   Behavioral Health contact info given Cognitive Testing MMSE - Mini Mental State Exam 01/20/2014 01/16/2013  Orientation to time 5 5  Orientation to Place 5 5  Registration 3 3  Attention/ Calculation 0 0  Recall 2 3  Language- name 2 objects 2 2  Language- repeat 1 1  Language- follow 3 step command 3 3  Language- read & follow direction 1 0  Write a sentence 1 0  Copy design 1 0  Total score 24 22   Mini-Cog passed with score 3/5  TUG Test:  Done in 9 seconds. Patient used both hands to push out of chair and to sit back down.   Immunization History  Administered Date(s) Administered  . Influenza,inj,Quad PF,36+ Mos 07/08/2014  . Td 09/11/1997, 06/04/2008   Screening Tests Health Maintenance  Topic Date Due  . INFLUENZA VACCINE  04/11/2016  . ZOSTAVAX  11/15/2016 (Originally 04/04/2013)  . TETANUS/TDAP  06/04/2018  . COLONOSCOPY  11/14/2023  . Hepatitis C Screening  Completed  . HIV Screening  Completed  Patient refused flu vaccine Patient states he will not get zostavax    Plan:   Patient thinks he may need reading glasses. List of eye doctors who take Medicare given to patient.  During the course of the visit the patient was educated and counseled about the following appropriate screening and preventive services:   Vaccines to include Pneumoccal, Influenza, Td,  Zostavax  Cardiovascular Disease  Colorectal cancer screening  Diabetes screening  Nutrition counseling   Smoking cessation counseling  Patient Instructions (the written plan) was given to the patient.    Velora Heckler, RN  05/16/2016

## 2016-05-16 NOTE — Patient Instructions (Signed)
Fall Prevention in the Home  Falls can cause injuries. They can happen to people of all ages. There are many things you can do to make your home safe and to help prevent falls.  WHAT CAN I DO ON THE OUTSIDE OF MY HOME?  Regularly fix the edges of walkways and driveways and fix any cracks.  Remove anything that might make you trip as you walk through a door, such as a raised step or threshold.  Trim any bushes or trees on the path to your home.  Use bright outdoor lighting.  Clear any walking paths of anything that might make someone trip, such as rocks or tools.  Regularly check to see if handrails are loose or broken. Make sure that both sides of any steps have handrails.  Any raised decks and porches should have guardrails on the edges.  Have any leaves, snow, or ice cleared regularly.  Use sand or salt on walking paths during winter.  Clean up any spills in your garage right away. This includes oil or grease spills. WHAT CAN I DO IN THE BATHROOM?   Use night lights.  Install grab bars by the toilet and in the tub and shower. Do not use towel bars as grab bars.  Use non-skid mats or decals in the tub or shower.  If you need to sit down in the shower, use a plastic, non-slip stool.  Keep the floor dry. Clean up any water that spills on the floor as soon as it happens.  Remove soap buildup in the tub or shower regularly.  Attach bath mats securely with double-sided non-slip rug tape.  Do not have throw rugs and other things on the floor that can make you trip. WHAT CAN I DO IN THE BEDROOM?  Use night lights.  Make sure that you have a light by your bed that is easy to reach.  Do not use any sheets or blankets that are too big for your bed. They should not hang down onto the floor.  Have a firm chair that has side arms. You can use this for support while you get dressed.  Do not have throw rugs and other things on the floor that can make you trip. WHAT CAN I DO  IN THE KITCHEN?  Clean up any spills right away.  Avoid walking on wet floors.  Keep items that you use a lot in easy-to-reach places.  If you need to reach something above you, use a strong step stool that has a grab bar.  Keep electrical cords out of the way.  Do not use floor polish or wax that makes floors slippery. If you must use wax, use non-skid floor wax.  Do not have throw rugs and other things on the floor that can make you trip. WHAT CAN I DO WITH MY STAIRS?  Do not leave any items on the stairs.  Make sure that there are handrails on both sides of the stairs and use them. Fix handrails that are broken or loose. Make sure that handrails are as long as the stairways.  Check any carpeting to make sure that it is firmly attached to the stairs. Fix any carpet that is loose or worn.  Avoid having throw rugs at the top or bottom of the stairs. If you do have throw rugs, attach them to the floor with carpet tape.  Make sure that you have a light switch at the top of the stairs and the bottom of the  stairs. If you do not have them, ask someone to add them for you. WHAT ELSE CAN I DO TO HELP PREVENT FALLS?  Wear shoes that:  Do not have high heels.  Have rubber bottoms.  Are comfortable and fit you well.  Are closed at the toe. Do not wear sandals.  If you use a stepladder:  Make sure that it is fully opened. Do not climb a closed stepladder.  Make sure that both sides of the stepladder are locked into place.  Ask someone to hold it for you, if possible.  Clearly mark and make sure that you can see:  Any grab bars or handrails.  First and last steps.  Where the edge of each step is.  Use tools that help you move around (mobility aids) if they are needed. These include:  Canes.  Walkers.  Scooters.  Crutches.  Turn on the lights when you go into a dark area. Replace any light bulbs as soon as they burn out.  Set up your furniture so you have a clear  path. Avoid moving your furniture around.  If any of your floors are uneven, fix them.  If there are any pets around you, be aware of where they are.  Review your medicines with your doctor. Some medicines can make you feel dizzy. This can increase your chance of falling. Ask your doctor what other things that you can do to help prevent falls.   This information is not intended to replace advice given to you by your health care provider. Make sure you discuss any questions you have with your health care provider.   Document Released: 06/24/2009 Document Revised: 01/12/2015 Document Reviewed: 10/02/2014 Elsevier Interactive Patient Education 2016 Jesse Franklin Jesse Franklin, Jesse Franklin A healthy lifestyle and preventative care can promote health and wellness.  Maintain regular health, dental, and eye exams.  Eat a healthy diet. Foods like vegetables, fruits, whole grains, low-fat dairy products, and lean protein foods contain the nutrients you need and are low in calories. Decrease your intake of foods high in solid fats, added sugars, and salt. Get information about a proper diet from your health care provider, if necessary.  Regular physical exercise is one of the most important things you can do for your health. Most adults should get at least 150 minutes of moderate-intensity exercise (any activity that increases your heart rate and causes you to sweat) each week. In addition, most adults need muscle-strengthening exercises on 2 or more days a week.   Maintain a healthy weight. The body mass index (BMI) is a screening tool to identify possible weight problems. It provides an estimate of body fat based on height and weight. Your health care provider can find your BMI and can help you achieve or maintain a healthy weight. For males 20 years and older:  A BMI below 18.5 is considered underweight.  A BMI of 18.5 to 24.9 is normal.  A BMI of 25 to 29.9 is considered overweight.  A BMI  of 30 and above is considered obese.  Maintain normal blood lipids and cholesterol by exercising and minimizing your intake of saturated fat. Eat a balanced diet with plenty of fruits and vegetables. Blood tests for lipids and cholesterol should begin at age 18 and be repeated every 5 years. If your lipid or cholesterol levels are high, you are over age 70, or you are at high risk for heart disease, you may need your cholesterol levels checked more frequently.Ongoing high lipid  and cholesterol levels should be treated with medicines if diet and exercise are not working.  If you smoke, find out from your health care provider how to quit. If you do not use tobacco, do not start.  Lung cancer screening is recommended for adults aged 21-80 years who are at high risk for developing lung cancer because of a history of smoking. A yearly low-dose CT scan of the lungs is recommended for people who have at least a 30-pack-year history of smoking and are current smokers or have quit within the past 15 years. A pack year of smoking is smoking an average of 1 pack of cigarettes a day for 1 year (for example, a 30-pack-year history of smoking could mean smoking 1 pack a day for 30 years or 2 packs a day for 15 years). Yearly screening should continue until the smoker has stopped smoking for at least 15 years. Yearly screening should be stopped for people who develop a health problem that would prevent them from having lung cancer treatment.  If you choose to drink alcohol, do not have more than 2 drinks per day. One drink is considered to be 12 oz (360 mL) of beer, 5 oz (150 mL) of wine, or 1.5 oz (45 mL) of liquor.  Avoid the use of street drugs. Do not share needles with anyone. Ask for help if you need support or instructions about stopping the use of drugs.  High blood pressure causes heart disease and increases the risk of stroke. High blood pressure is more likely to develop in:  People who have blood  pressure in the end of the normal range (100-139/85-89 mm Hg).  People who are overweight or obese.  People who are African [place].  If you are 46-72 years of age, have your blood pressure checked every 3-5 years. If you are 55 years of age or older, have your blood pressure checked every year. You should have your blood pressure measured twice--once when you are at a hospital or clinic, and once when you are not at a hospital or clinic. Record the average of the two measurements. To check your blood pressure when you are not at a hospital or clinic, you can use:  An automated blood pressure machine at a pharmacy.  A home blood pressure monitor.  If you are 22-74 years old, ask your health care provider if you should take aspirin to prevent heart disease.  Diabetes screening involves taking a blood sample to check your fasting blood sugar level. This should be done once every 3 years after age 39 if you are at a normal weight and without risk factors for diabetes. Testing should be considered at a younger age or be carried out more frequently if you are overweight and have at least 1 risk factor for diabetes.  Colorectal cancer can be detected and often prevented. Most routine colorectal cancer screening begins at the age of 14 and continues through age 8. However, your health care provider may recommend screening at an earlier age if you have risk factors for colon cancer. On a yearly basis, your health care provider may provide home test kits to check for hidden blood in the stool. A small camera at the end of a tube may be used to directly examine the colon (sigmoidoscopy or colonoscopy) to detect the earliest forms of colorectal cancer. Talk to your health care provider about this at age 98 when routine screening begins. A direct exam of the colon should be  repeated every 5-10 years through age 50, unless early forms of precancerous polyps or small growths are found.  People who are at an  increased risk for hepatitis B should be screened for this virus. You are considered at high risk for hepatitis B if:  You were born in a country where hepatitis B occurs often. Talk with your health care provider about which countries are considered high risk.  Your parents were born in a high-risk country and you have not received a shot to protect against hepatitis B (hepatitis B vaccine).  You have HIV or AIDS.  You use needles to inject street drugs.  You live with, or have sex with, someone who has hepatitis B.  You are a man who has sex with other men (MSM).  You get hemodialysis treatment.  You take certain medicines for conditions like cancer, organ transplantation, and autoimmune conditions.  Hepatitis C blood testing is recommended for all people born from [birth year] through 1963 and any individual with known risk factors for hepatitis C.  Healthy men should no longer receive prostate-specific antigen (PSA) blood tests as part of routine cancer screening. Talk to your health care provider about prostate cancer screening.  Testicular cancer screening is not recommended for adolescents or adult males who have no symptoms. Screening includes self-exam, a health care provider exam, and other screening tests. Consult with your health care provider about any symptoms you have or any concerns you have about testicular cancer.  Practice safe sex. Use condoms and avoid high-risk sexual practices to reduce the spread of sexually transmitted infections (STIs).  You should be screened for STIs, including gonorrhea and chlamydia if:  You are sexually active and are younger than 24 years.  You are older than 24 years, and your health care provider tells you that you are at risk for this type of infection.  Your sexual activity has changed since you were last screened, and you are at an increased risk for chlamydia or gonorrhea. Ask your health care provider if you are at risk.  If you are at  risk of being infected with HIV, it is recommended that you take a prescription medicine daily to prevent HIV infection. This is called pre-exposure prophylaxis (PrEP). You are considered at risk if:  You are a man who has sex with other men (MSM).  You are a heterosexual man who is sexually active with multiple partners.  You take drugs by injection.  You are sexually active with a partner who has HIV.  Talk with your health care provider about whether you are at high risk of being infected with HIV. If you choose to begin PrEP, you should first be tested for HIV. You should then be tested every 3 months for as long as you are taking PrEP.  Use sunscreen. Apply sunscreen liberally and repeatedly throughout the day. You should seek shade when your shadow is shorter than you. Protect yourself by wearing long sleeves, pants, a wide-brimmed hat, and sunglasses year round whenever you are outdoors.  Tell your health care provider of new moles or changes in moles, especially if there is a change in shape or color. Also, tell your health care provider if a mole is larger than the size of a pencil eraser.  A one-time screening for abdominal aortic aneurysm (AAA) and surgical repair of large AAAs by ultrasound is recommended for men aged 89-75 years who are current or former smokers.  Stay current with your vaccines (immunizations).  This information is not intended to replace advice given to you by your health care provider. Make sure you discuss any questions you have with your health care provider.   Document Released: 02/24/2008 Document Revised: 09/18/2014 Document Reviewed: 01/23/2011 Elsevier Interactive Patient Education 2016 Jesse Franklin.   Hearing Loss Hearing loss is a partial or total loss of the ability to hear. This can be temporary or permanent, and it can happen in one or both ears. Hearing loss may be referred to as deafness. Medical care is necessary to treat hearing loss  properly and to prevent the condition from getting worse. Your hearing may partially or completely come back, depending on what caused your hearing loss and how severe it is. In some cases, hearing loss is permanent. CAUSES Common causes of hearing loss include:   Too much wax in the ear canal.   Infection of the ear canal or middle ear.   Fluid in the middle ear.   Injury to the ear or surrounding area.   An object stuck in the ear.   Prolonged exposure to loud sounds, such as music.  Less common causes of hearing loss include:   Tumors in the ear.   Viral or bacterial infections, such as meningitis.   A hole in the eardrum (perforated eardrum).  Problems with the hearing nerve that sends signals between the brain and the ear.  Certain medicines.  SYMPTOMS  Symptoms of this condition may include:  Difficulty telling the difference between sounds.  Difficulty following a conversation when there is background noise.  Lack of response to sounds in your environment. This may be most noticeable when you do not respond to startling sounds.  Needing to turn up the volume on the television, radio, etc.  Ringing in the ears.  Dizziness.  Pain in the ears. DIAGNOSIS This condition is diagnosed based on a physical exam and a hearing test (audiometry). The audiometry test will be performed by a hearing specialist (audiologist). You may also be referred to an ear, nose, and throat (ENT) specialist (otolaryngologist).  TREATMENT Treatment for recent onset of hearing loss may include:   Ear wax removal.   Being prescribed medicines to prevent infection (antibiotics).   Being prescribed medicines to reduce inflammation (corticosteroids).  HOME CARE INSTRUCTIONS  If you were prescribed an antibiotic medicine, take it as told by your health care provider. Do not stop taking the antibiotic even if you start to feel better.  Take over-the-counter and prescription  medicines only as told by your health care provider.  Avoid loud noises.   Return to your normal activities as told by your health care provider. Ask your health care provider what activities are safe for you.  Keep all follow-up visits as told by your health care provider. This is important. SEEK MEDICAL CARE IF:   You feel dizzy.   You develop new symptoms.   You vomit or feel nauseous.   You have a fever.  SEEK IMMEDIATE MEDICAL CARE IF:  You develop sudden changes in your vision.   You have severe ear pain.   You have new or increased weakness.  You have a severe headache.   This information is not intended to replace advice given to you by your health care provider. Make sure you discuss any questions you have with your health care provider.   Document Released: 08/28/2005 Document Revised: 05/19/2015 Document Reviewed: 01/13/2015 Elsevier Interactive Patient Education 2016 Jesse Franklin Can Quit Smoking If  you are ready to quit smoking or are thinking about it, congratulations! You have chosen to help yourself be healthier and live longer! There are lots of different ways to quit smoking. Nicotine gum, nicotine patches, a nicotine inhaler, or nicotine nasal spray can help with physical craving. Hypnosis, support groups, and medicines help break the habit of smoking. TIPS TO GET OFF AND STAY OFF CIGARETTES  Learn to predict your moods. Do not let a bad situation be your excuse to have a cigarette. Some situations in your life might tempt you to have a cigarette.  Ask friends and co-workers not to smoke around you.  Make your home smoke-free.  Never have "just one" cigarette. It leads to wanting another and another. Remind yourself of your decision to quit.  On a card, make a list of your reasons for not smoking. Read it at least the same number of times a day as you have a cigarette. Tell yourself everyday, "I do not want to smoke. I choose not to  smoke."  Ask someone at home or work to help you with your plan to quit smoking.  Have something planned after you eat or have a cup of coffee. Take a walk or get other exercise to perk you up. This will help to keep you from overeating.  Try a relaxation exercise to calm you down and decrease your stress. Remember, you may be tense and nervous the first two weeks after you quit. This will pass.  Find new activities to keep your hands busy. Play with a pen, coin, or rubber band. Doodle or draw things on paper.  Brush your teeth right after eating. This will help cut down the craving for the taste of tobacco after meals. You can try mouthwash too.  Try gum, breath mints, or diet candy to keep something in your mouth. IF YOU SMOKE AND WANT TO QUIT:  Do not stock up on cigarettes. Never buy a carton. Wait until one pack is finished before you buy another.  Never carry cigarettes with you at work or at home.  Keep cigarettes as far away from you as possible. Leave them with someone else.  Never carry matches or a lighter with you.  Ask yourself, "Do I need this cigarette or is this just a reflex?"  Bet with someone that you can quit. Put cigarette money in a piggy bank every morning. If you smoke, you give up the money. If you do not smoke, by the end of the week, you keep the money.  Keep trying. It takes 21 days to change a habit!  Talk to your doctor about using medicines to help you quit. These include nicotine replacement gum, lozenges, or skin patches.   This information is not intended to replace advice given to you by your health care provider. Make sure you discuss any questions you have with your health care provider.   Document Released: 06/24/2009 Document Revised: 11/20/2011 Document Reviewed: 06/24/2009 Elsevier Interactive Patient Education [employer].

## 2016-05-17 ENCOUNTER — Encounter: Payer: Self-pay | Admitting: *Deleted

## 2016-05-17 NOTE — Progress Notes (Signed)
I have reviewed this visit and discussed with Howell Rucks, RN, BSN, and agree with her documentation.   Leeanne Rio, MD

## 2016-05-22 ENCOUNTER — Ambulatory Visit (INDEPENDENT_AMBULATORY_CARE_PROVIDER_SITE_OTHER): Payer: Medicare Other | Admitting: *Deleted

## 2016-05-22 DIAGNOSIS — I4891 Unspecified atrial fibrillation: Secondary | ICD-10-CM | POA: Diagnosis not present

## 2016-05-22 DIAGNOSIS — Z5181 Encounter for therapeutic drug level monitoring: Secondary | ICD-10-CM | POA: Diagnosis not present

## 2016-05-22 LAB — POCT INR: INR: 2.5

## 2016-06-29 ENCOUNTER — Other Ambulatory Visit: Payer: Self-pay | Admitting: Cardiovascular Disease

## 2016-07-03 ENCOUNTER — Ambulatory Visit (INDEPENDENT_AMBULATORY_CARE_PROVIDER_SITE_OTHER): Payer: Medicare Other

## 2016-07-03 DIAGNOSIS — I4891 Unspecified atrial fibrillation: Secondary | ICD-10-CM | POA: Diagnosis not present

## 2016-07-03 DIAGNOSIS — Z5181 Encounter for therapeutic drug level monitoring: Secondary | ICD-10-CM

## 2016-07-03 LAB — POCT INR: INR: 2.7

## 2016-07-05 DIAGNOSIS — M1A09X1 Idiopathic chronic gout, multiple sites, with tophus (tophi): Secondary | ICD-10-CM | POA: Diagnosis not present

## 2016-07-05 DIAGNOSIS — M2550 Pain in unspecified joint: Secondary | ICD-10-CM | POA: Diagnosis not present

## 2016-07-19 ENCOUNTER — Other Ambulatory Visit: Payer: Self-pay | Admitting: Family Medicine

## 2016-07-20 MED ORDER — PANTOPRAZOLE SODIUM 40 MG PO TBEC: 40.0000 mg | DELAYED_RELEASE_TABLET | Freq: Every day | ORAL | 3 refills | Status: DC

## 2016-07-20 NOTE — Telephone Encounter (Signed)
2nd request.  Martin, Tamika L, RN  

## 2016-07-27 ENCOUNTER — Other Ambulatory Visit: Payer: Self-pay | Admitting: Family Medicine

## 2016-07-31 ENCOUNTER — Ambulatory Visit (INDEPENDENT_AMBULATORY_CARE_PROVIDER_SITE_OTHER): Payer: Medicare Other | Admitting: Family Medicine

## 2016-07-31 ENCOUNTER — Encounter: Payer: Self-pay | Admitting: Family Medicine

## 2016-07-31 VITALS — BP 127/70 | HR 48 | Temp 98.4°F | Ht 67.0 in | Wt 180.0 lb

## 2016-07-31 DIAGNOSIS — I4891 Unspecified atrial fibrillation: Secondary | ICD-10-CM

## 2016-07-31 DIAGNOSIS — F17200 Nicotine dependence, unspecified, uncomplicated: Secondary | ICD-10-CM

## 2016-07-31 DIAGNOSIS — D649 Anemia, unspecified: Secondary | ICD-10-CM

## 2016-07-31 DIAGNOSIS — E785 Hyperlipidemia, unspecified: Secondary | ICD-10-CM | POA: Diagnosis not present

## 2016-07-31 DIAGNOSIS — E7800 Pure hypercholesterolemia, unspecified: Secondary | ICD-10-CM

## 2016-07-31 DIAGNOSIS — M1A9XX0 Chronic gout, unspecified, without tophus (tophi): Secondary | ICD-10-CM | POA: Diagnosis not present

## 2016-07-31 DIAGNOSIS — I10 Essential (primary) hypertension: Secondary | ICD-10-CM

## 2016-07-31 NOTE — Progress Notes (Signed)
Date of Visit: 07/31/2016   HPI:  Patient presents for routine follow up. He has no complaints today, he is doing well.  Hypertension - currently taking amlodipine 71m daily. Denies chest pain, shortness of breath.   Hyperlipidemia - currently taking crestor 463mdaily. Tolerating well. No issues. Due for lipids but is nonfasting, will return for lab visit.  Gout - follows with rheumatology. Gout is well controlled, no recent flares. Taking allopurinol 10034mwice daily and colchicine 0.6mg66mice daily. Does not think he's had uric acid level checked in a while.  GERD - taking protonix 40mg69mly, doing well on this.  Smoking - smokes 3-4 cigarettes per day and also marijuana once per day. Is not willing to give up THC but would be willing to try to come off tobacco.  ROS: See HPI.  PMFSHDuboistory of tobacco abuse, hypertension, gout, hyperlipidemia, CAD, afib on coumadin  PHYSICAL EXAM: BP 127/70   Pulse (!) 48   Temp 98.4 F (36.9 C) (Oral)   Ht 5' 7"  (1.702 m)   Wt 180 lb (81.6 kg)   BMI 28.19 kg/m  Gen: NAD, pleasant, cooperative HEENT: normocephalic, atraumatic, moist mucous membranes  Heart: mildly bradycardic, regular rhythm, no murmur Lungs: clear to auscultation bilaterally, normal work of breathing  Neuro: alert groslsly nonfocal speech normal. Ext: No appreciable lower extremity edema bilaterally   ASSESSMENT/PLAN:  Health maintenance:  -UTD on all health maintenance items -reviewed colonoscopy report from 11/13/13 - recommends repeat colonoscopy in 10 years, but in 3 years to resume annual FOBT testing - added to health maintenance  Atrial fibrillation Rate controlled (afib with slow ventricular response) Doing well. Continue follow up with cardiology coumadin clinic. Check CBC with labs given history of anemia, on iron therapy  TOBACCO DEPENDENCE Counseled on risks of smoking. Again encouraged him to quit at least tobacco, if he is unwilling to give up  THC.  HYPERTENSION, BENIGN ESSENTIAL Well controlled. Continue amlodipine. Check renal function & electrolytes today.  Gout Well controlled by patient history. Follows regularly with rheumatology. Will check uric acid level with labs.  HYPERCHOLESTEROLEMIA Return for fasting lipids at lab visit.  FOLLOW UP: Follow up in 6 mos for routine care  BrittTanzaniacIntArdelia MemsCoBartlett

## 2016-07-31 NOTE — Patient Instructions (Addendum)
It was great to see you again today!  Blood pressure looks great  On your way out, schedule an appointment one morning to come back for fasting labs. Do not eat or drink anything other than water the morning of your lab appointment until after your labs are drawn.   See me in 3-6 months, sooner if needed  Be well, Dr. Ardelia Mems

## 2016-07-31 NOTE — Telephone Encounter (Signed)
2nd request.  Martin, Tamika L, RN  

## 2016-08-01 NOTE — Assessment & Plan Note (Signed)
Counseled on risks of smoking. Again encouraged him to quit at least tobacco, if he is unwilling to give up THC.

## 2016-08-01 NOTE — Assessment & Plan Note (Addendum)
Rate controlled (afib with slow ventricular response) Doing well. Continue follow up with cardiology coumadin clinic. Check CBC with labs given history of anemia, on iron therapy

## 2016-08-01 NOTE — Assessment & Plan Note (Signed)
Well controlled by patient history. Follows regularly with rheumatology. Will check uric acid level with labs.

## 2016-08-01 NOTE — Assessment & Plan Note (Signed)
Return for fasting lipids at lab visit.

## 2016-08-01 NOTE — Assessment & Plan Note (Signed)
Well controlled. Continue amlodipine. Check renal function & electrolytes today.

## 2016-08-02 ENCOUNTER — Other Ambulatory Visit: Payer: Medicare Other

## 2016-08-02 DIAGNOSIS — D649 Anemia, unspecified: Secondary | ICD-10-CM | POA: Diagnosis not present

## 2016-08-02 DIAGNOSIS — E785 Hyperlipidemia, unspecified: Secondary | ICD-10-CM

## 2016-08-02 DIAGNOSIS — M1A9XX0 Chronic gout, unspecified, without tophus (tophi): Secondary | ICD-10-CM

## 2016-08-02 LAB — CBC
HCT: 38.4 % — ABNORMAL LOW (ref 38.5–50.0)
Hemoglobin: 12.9 g/dL — ABNORMAL LOW (ref 13.2–17.1)
MCH: 31.8 pg (ref 27.0–33.0)
MCHC: 33.6 g/dL (ref 32.0–36.0)
MCV: 94.6 fL (ref 80.0–100.0)
MPV: 10.6 fL (ref 7.5–12.5)
PLATELETS: 167 10*3/uL (ref 140–400)
RBC: 4.06 MIL/uL — AB (ref 4.20–5.80)
RDW: 15.7 % — AB (ref 11.0–15.0)
WBC: 3 10*3/uL — ABNORMAL LOW (ref 3.8–10.8)

## 2016-08-03 LAB — COMPLETE METABOLIC PANEL WITH GFR
ALBUMIN: 4.7 g/dL (ref 3.6–5.1)
ALK PHOS: 76 U/L (ref 40–115)
ALT: 39 U/L (ref 9–46)
AST: 35 U/L (ref 10–35)
BILIRUBIN TOTAL: 0.6 mg/dL (ref 0.2–1.2)
BUN: 10 mg/dL (ref 7–25)
CALCIUM: 9.5 mg/dL (ref 8.6–10.3)
CO2: 21 mmol/L (ref 20–31)
Chloride: 110 mmol/L (ref 98–110)
Creat: 1.25 mg/dL (ref 0.70–1.25)
GFR, EST AFRICAN AMERICAN: 70 mL/min (ref >=60)
GFR, EST NON AFRICAN AMERICAN: 61 mL/min (ref >=60)
Glucose, Bld: 127 mg/dL — ABNORMAL HIGH (ref 65–99)
POTASSIUM: 3.4 mmol/L — AB (ref 3.5–5.3)
Sodium: 142 mmol/L (ref 135–146)
TOTAL PROTEIN: 7.6 g/dL (ref 6.1–8.1)

## 2016-08-03 LAB — LIPID PANEL
CHOL/HDL RATIO: 3.4 ratio (ref <5.0)
CHOLESTEROL: 133 mg/dL (ref <200)
HDL: 39 mg/dL — AB (ref >40)
LDL Cholesterol: 68 mg/dL (ref <100)
TRIGLYCERIDES: 131 mg/dL (ref <150)
VLDL: 26 mg/dL (ref <30)

## 2016-08-03 LAB — URIC ACID: Uric Acid, Serum: 4.4 mg/dL (ref 4.0–8.0)

## 2016-08-09 ENCOUNTER — Telehealth: Payer: Self-pay | Admitting: Family Medicine

## 2016-08-09 DIAGNOSIS — R739 Hyperglycemia, unspecified: Secondary | ICD-10-CM

## 2016-08-09 DIAGNOSIS — E876 Hypokalemia: Secondary | ICD-10-CM

## 2016-08-09 NOTE — Telephone Encounter (Signed)
Called patient to discuss labs. Cholesterol looks good. Renal function stable (GFR 70). K mildly low at 3.4. Will repeat BMET in 1 month to ensure stability of K (hold on repletion at this time, he was fasting during this lab draw and was only mildly low). Notably glucose elevated, so will check A1c as well.  CBC stable from prior values - reviewed hematology notes of prior consultation for pancytopenia. As this is overall stable, hold on further workup at this time.   Leeanne Rio, MD

## 2016-08-14 ENCOUNTER — Ambulatory Visit (INDEPENDENT_AMBULATORY_CARE_PROVIDER_SITE_OTHER): Payer: Medicare Other | Admitting: *Deleted

## 2016-08-14 ENCOUNTER — Other Ambulatory Visit: Payer: Self-pay | Admitting: *Deleted

## 2016-08-14 DIAGNOSIS — Z5181 Encounter for therapeutic drug level monitoring: Secondary | ICD-10-CM

## 2016-08-14 DIAGNOSIS — I4891 Unspecified atrial fibrillation: Secondary | ICD-10-CM

## 2016-08-14 LAB — POCT INR: INR: 2.7

## 2016-08-14 MED ORDER — WARFARIN SODIUM 5 MG PO TABS: ORAL_TABLET | ORAL | 1 refills | Status: DC

## 2016-09-03 ENCOUNTER — Other Ambulatory Visit: Payer: Self-pay | Admitting: Family Medicine

## 2016-09-17 ENCOUNTER — Other Ambulatory Visit: Payer: Self-pay | Admitting: Family Medicine

## 2016-09-25 ENCOUNTER — Ambulatory Visit (INDEPENDENT_AMBULATORY_CARE_PROVIDER_SITE_OTHER): Payer: Medicare Other

## 2016-09-25 DIAGNOSIS — I4891 Unspecified atrial fibrillation: Secondary | ICD-10-CM | POA: Diagnosis not present

## 2016-09-25 DIAGNOSIS — Z5181 Encounter for therapeutic drug level monitoring: Secondary | ICD-10-CM

## 2016-09-25 LAB — POCT INR: INR: 2.4

## 2016-10-15 ENCOUNTER — Other Ambulatory Visit: Payer: Self-pay | Admitting: Cardiovascular Disease

## 2016-11-06 ENCOUNTER — Encounter (INDEPENDENT_AMBULATORY_CARE_PROVIDER_SITE_OTHER): Payer: Self-pay

## 2016-11-06 ENCOUNTER — Ambulatory Visit (INDEPENDENT_AMBULATORY_CARE_PROVIDER_SITE_OTHER): Payer: Medicare Other | Admitting: *Deleted

## 2016-11-06 DIAGNOSIS — M2550 Pain in unspecified joint: Secondary | ICD-10-CM | POA: Diagnosis not present

## 2016-11-06 DIAGNOSIS — I4891 Unspecified atrial fibrillation: Secondary | ICD-10-CM

## 2016-11-06 DIAGNOSIS — Z5181 Encounter for therapeutic drug level monitoring: Secondary | ICD-10-CM | POA: Diagnosis not present

## 2016-11-06 DIAGNOSIS — M1A09X1 Idiopathic chronic gout, multiple sites, with tophus (tophi): Secondary | ICD-10-CM | POA: Diagnosis not present

## 2016-11-06 DIAGNOSIS — Z79899 Other long term (current) drug therapy: Secondary | ICD-10-CM | POA: Diagnosis not present

## 2016-11-06 LAB — POCT INR: INR: 2.6

## 2016-12-18 ENCOUNTER — Ambulatory Visit (INDEPENDENT_AMBULATORY_CARE_PROVIDER_SITE_OTHER): Payer: Medicare Other | Admitting: *Deleted

## 2016-12-18 ENCOUNTER — Other Ambulatory Visit: Payer: Self-pay | Admitting: Cardiovascular Disease

## 2016-12-18 DIAGNOSIS — Z5181 Encounter for therapeutic drug level monitoring: Secondary | ICD-10-CM | POA: Diagnosis not present

## 2016-12-18 DIAGNOSIS — I4891 Unspecified atrial fibrillation: Secondary | ICD-10-CM | POA: Diagnosis not present

## 2016-12-18 LAB — POCT INR: INR: 3.1

## 2016-12-25 ENCOUNTER — Ambulatory Visit (INDEPENDENT_AMBULATORY_CARE_PROVIDER_SITE_OTHER): Payer: Medicare Other | Admitting: Cardiovascular Disease

## 2016-12-25 ENCOUNTER — Encounter: Payer: Self-pay | Admitting: Cardiovascular Disease

## 2016-12-25 ENCOUNTER — Ambulatory Visit: Payer: Medicare Other | Admitting: Family Medicine

## 2016-12-25 VITALS — BP 138/80 | HR 58 | Ht 67.0 in | Wt 181.6 lb

## 2016-12-25 DIAGNOSIS — I2510 Atherosclerotic heart disease of native coronary artery without angina pectoris: Secondary | ICD-10-CM

## 2016-12-25 DIAGNOSIS — I4891 Unspecified atrial fibrillation: Secondary | ICD-10-CM

## 2016-12-25 DIAGNOSIS — I272 Pulmonary hypertension, unspecified: Secondary | ICD-10-CM | POA: Diagnosis not present

## 2016-12-25 LAB — COMPREHENSIVE METABOLIC PANEL
ALBUMIN: 4.6 g/dL (ref 3.6–4.8)
ALK PHOS: 87 IU/L (ref 39–117)
ALT: 34 IU/L (ref 0–44)
AST: 31 IU/L (ref 0–40)
Albumin/Globulin Ratio: 1.5 (ref 1.2–2.2)
BUN/Creatinine Ratio: 8 — ABNORMAL LOW (ref 10–24)
BUN: 10 mg/dL (ref 8–27)
Bilirubin Total: 0.4 mg/dL (ref 0.0–1.2)
CO2: 18 mmol/L (ref 18–29)
CREATININE: 1.25 mg/dL (ref 0.76–1.27)
Calcium: 9.7 mg/dL (ref 8.6–10.2)
Chloride: 104 mmol/L (ref 96–106)
GFR calc Af Amer: 70 mL/min/{1.73_m2} (ref >59)
GFR calc non Af Amer: 61 mL/min/{1.73_m2} (ref >59)
GLOBULIN, TOTAL: 3 g/dL (ref 1.5–4.5)
GLUCOSE: 125 mg/dL — AB (ref 65–99)
Potassium: 3.8 mmol/L (ref 3.5–5.2)
Sodium: 141 mmol/L (ref 134–144)
Total Protein: 7.6 g/dL (ref 6.0–8.5)

## 2016-12-25 LAB — LIPID PANEL
CHOL/HDL RATIO: 3.7 ratio (ref 0.0–5.0)
Cholesterol, Total: 141 mg/dL (ref 100–199)
HDL: 38 mg/dL — AB (ref >39)
LDL Calculated: 75 mg/dL (ref 0–99)
Triglycerides: 139 mg/dL (ref 0–149)
VLDL CHOLESTEROL CAL: 28 mg/dL (ref 5–40)

## 2016-12-25 NOTE — Progress Notes (Signed)
Cardiology Office Note   Date:  12/25/2016   ID:  Jesse Franklin, DOB [birth year], MRN [id number]  PCP:  Chrisandra Netters, MD  Cardiologist:   Mertie Moores, MD   Chief Complaint  Patient presents with  . Atrial Fibrillation   1. Atrial fibrillation -  2. Coronary artery disease RESULTS: 08/30/04 1. Left main: Normal.  2. LAD: Large vessel, tortuous in the mid and distal section with mild  luminal irregularities. The extreme distal portion of the LAD after the  trifurcation of the distal vessel has a subtotal occlusion at the  trifurcation tip. Vessel less than 0.5 mm at this juncture.  3. LXC: Codominant with mild luminal irregularities.  4. OM1 and OM2: Large vessels with mild luminal irregularities.  5. RCA: Tortuous, codominant with mild luminal irregularities, very faint  right to left filling collaterals seen at the apex.  6. LV: EF 50-55% with mild inferior apical hypokinesis. LVEDP was 35  mmHg.  7. Abdominal/distal aortogram showed mild tortuosity. No significant  aneurysms were seen. The distal aorta showed mild splaying to the left  at the area of the iliac bifurcation. No significant iliac or femoral  disease was noted.  3. Hypertension 4. 6. Moderate pulmonary hypertension by echo in 2010 ( ongoing cigarette smoking)  5. Hyperlipidemia   Jesse Franklin is a 66 yo with hx as noted above. He has occasional episodes of left arm tingling. These episodes last 1-2 seconds. They're not necessarily associated with exertion. The tingling seems to get better with arm movement. He also has occasional episodes of shortness of breath.  He walks on a regular basis. He walks approximately 1 mile a day. He still eats some salt and also eats fried and fast foods.  December 02, 2013:  Jesse Franklin is doing well. He denies any chest pain or shortness of breath. He walks about 15-20 minutes each day. He no longer works. He has cut down on his salt usage.    [name] 5, 2016:     Jesse Franklin is a 66 y.o. male who presents for follow-up of his atrial fibrillation. He also has a history of hypertension  Still smoking  -   Nov. 1, 2016:  Doing well from a cardiac standpoint.  Having gout issues  in left elbow and hand.  No Cp or dyspnea.  Still smoking   January 03, 2016:    Jesse Franklin is seen today  No CP , some back pain  Still smoking    December 25, 2016  occasional cp .  No changes in his angina pattern Still smoking   Past Medical History:  Diagnosis Date  . Alcohol abuse   . Anemia   . Atrial fibrillation ([place])   . BPH 11/08/2006  . CAD (coronary artery disease)    [place]essel  . Cocaine abuse    Hx of  . GERD (gastroesophageal reflux disease)   . Gout   . Herpes   . Herpes genitalia 04/13/2011  . Hyperlipidemia   . Hypertension   . Myocardial infarction ([place]) 2005  . RHINITIS, ALLERGIC 11/08/2006  . Tobacco abuse     Past Surgical History:  Procedure Laterality Date  . CARDIAC CATHETERIZATION  08/2004  . LACERATION REPAIR Right 03/1988   "hand"     Current Outpatient Prescriptions  Medication Sig Dispense Refill  . allopurinol (ZYLOPRIM) 100 MG tablet Take 100 mg by mouth 2 (two) times daily.     Marland Kitchen amLODipine (NORVASC) 10 MG tablet TAKE  1 TABLET (10 MG TOTAL) BY MOUTH DAILY. 90 tablet 3  . clopidogrel (PLAVIX) 75 MG tablet TAKE 1 TABLET BY MOUTH EVERY DAY 30 tablet 0  . colchicine 0.6 MG tablet TAKE 1 TABLET BY MOUTH TWICE A DAY 180 tablet 3  . ferrous sulfate 325 (65 FE) MG tablet TAKE 1 TABLET BY MOUTH EVERY DAY WITH BREAKFAST 30 tablet 11  . nitroGLYCERIN (NITROSTAT) 0.4 MG SL tablet Place 1 tablet (0.4 mg total) under the tongue as directed. Place 1 tab under tongue as directed 25 tablet 6  . pantoprazole (PROTONIX) 40 MG tablet Take 1 tablet (40 mg total) by mouth daily. 90 tablet 3  . rosuvastatin (CRESTOR) 40 MG tablet TAKE 1 TABLET BY MOUTH EVERY DAY 90 tablet 3  . warfarin (COUMADIN) 5 MG tablet TAKE AS DIRECTED BY COUMADIN  CLINIC 120 tablet 1   No current facility-administered medications for this visit.    Facility-Administered Medications Ordered in Other Visits  Medication Dose Route Frequency Provider Last Rate Last Dose  . 0.9 %  sodium chloride infusion   Intravenous Continuous Willeen Niece, MD 125 mL/hr at 07/31/14 1200      Allergies:   Codeine and Ketorolac tromethamine    Social History:  The patient  reports that he has been smoking Cigarettes.  He has a 4.80 pack-year smoking history. He quit smokeless tobacco use about 49 years ago. His smokeless tobacco use included Chew. He reports that he uses drugs, including "Crack" cocaine and Marijuana. He reports that he does not drink alcohol.   Family History:  The patient's family history includes Alcohol abuse in his brother and father; Arthritis in his brother; Cancer in his sister; Gout in his mother; Heart disease in his brother; Hypertension in his mother and sister.    ROS:  Please see the history of present illness.    Review of Systems: Constitutional:  denies fever, chills, diaphoresis, appetite change and fatigue.  HEENT: denies photophobia, eye pain, redness, hearing loss, ear pain, congestion, sore throat, rhinorrhea, sneezing, neck pain, neck stiffness and tinnitus.  Respiratory: denies SOB, DOE, cough, chest tightness, and wheezing.  Cardiovascular: denies chest pain, palpitations and leg swelling.  Gastrointestinal: denies nausea, vomiting, abdominal pain, diarrhea, constipation, blood in stool.  Genitourinary: denies dysuria, urgency, frequency, hematuria, flank pain and difficulty urinating.  Musculoskeletal: denies  myalgias, back pain, joint swelling, arthralgias and gait problem.   Skin: denies pallor, rash and wound.  Neurological: denies dizziness, seizures, syncope, weakness, light-headedness, numbness and headaches.   Hematological: denies adenopathy, easy bruising, personal or family bleeding history.   Psychiatric/ Behavioral: denies suicidal ideation, mood changes, confusion, nervousness, sleep disturbance and agitation.       All other systems are reviewed and negative.    PHYSICAL EXAM: VS:  BP 138/80 (BP Location: Right Arm, Patient Position: Sitting, Cuff Size: Normal)   Pulse (!) 58   Ht 5' 7"  (1.702 m)   Wt 181 lb 9.6 oz (82.4 kg)   SpO2 99%   BMI 28.44 kg/m  , BMI Body mass index is 28.44 kg/m. GEN: Well nourished, well developed, in no acute distress  HEENT: normal  Neck: no JVD, carotid bruits, or masses Cardiac: Irreg. Irreg. ;  Soft systolic murmur and soft diastolic murmur , no  rubs, or gallops,no edema  Respiratory:  clear to auscultation bilaterally, normal work of breathing GI: soft, nontender, nondistended, + BS MS: no deformity or atrophy  Skin: warm and dry, no rash Neuro:  Strength and sensation are intact Psych: normal   EKG:  EKG is ordered today. The ekg ordered today demonstrates :  Atrial fib at rate of 58.  TWI in the lateral leads.   No changes from previous tracing    Recent Labs: 08/02/2016: ALT 39; BUN 10; Creat 1.25; Hemoglobin 12.9; Platelets 167; Potassium 3.4; Sodium 142    Lipid Panel    Component Value Date/Time   CHOL 133 08/02/2016 0913   TRIG 131 08/02/2016 0913   HDL 39 (L) 08/02/2016 0913   CHOLHDL 3.4 08/02/2016 0913   VLDL 26 08/02/2016 0913   LDLCALC 68 08/02/2016 0913   LDLDIRECT 95 05/28/2012 0925      Wt Readings from Last 3 Encounters:  12/25/16 181 lb 9.6 oz (82.4 kg)  07/31/16 180 lb (81.6 kg)  05/16/16 176 lb 12.8 oz (80.2 kg)      Other studies Reviewed: Additional studies/ records that were reviewed today include: . Review of the above records demonstrates:    ASSESSMENT AND PLAN:  1. Atrial fibrillation-  Atrial fib with slow ventricular response.   HR of 49.   LVH with repol changes.  On coumadin .   INR levels are theraputic   2. Coronary artery disease RESULTS: 08/30/04 1. Left main:  Normal.  2. LAD: Large vessel, tortuous in the mid and distal section with mild  luminal irregularities. The extreme distal portion of the LAD after the  trifurcation of the distal vessel has a subtotal occlusion at the  trifurcation tip. Vessel less than 0.5 mm at this juncture.  3. LXC: Codominant with mild luminal irregularities.  4. OM1 and OM2: Large vessels with mild luminal irregularities.  5. RCA: Tortuous, codominant with mild luminal irregularities, very faint  right to left filling collaterals seen at the apex.  6. LV: EF 50-55% with mild inferior apical hypokinesis. LVEDP was 35  mmHg.  7. Abdominal/distal aortogram showed mild tortuosity. No significant  aneurysms were seen. The distal aorta showed mild splaying to the left  at the area of the iliac bifurcation. No significant iliac or femoral  disease was noted.  3. Hypertension - BP is ok   4.  Moderate pulmonary hypertension by echo in 2010 ( ongoing cigarette smoking) - encouraged him to stop  5. Hyperlipidemia - we'll check labs today.   Current medicines are reviewed at length with the patient today.  The patient does not have concerns regarding medicines.  The following changes have been made:  no change  Labs/ tests ordered today include:  No orders of the defined types were placed in this encounter.   Disposition:   FU with me in 6 months     Mertie Moores, MD  12/25/2016 9:13 AM    [address], [place], [place]  [address] Phone: [phone]; Fax: [phone]

## 2016-12-25 NOTE — Patient Instructions (Signed)
Medication Instructions:  Your physician recommends that you continue on your current medications as directed. Please refer to the Current Medication list given to you today.   Labwork: TODAY - CMET, Lipid   Testing/Procedures: None Ordered   Follow-Up: Your physician wants you to follow-up in: 6 months with Dr. Acie Fredrickson.  You will receive a reminder letter in the mail two months in advance. If you don't receive a letter, please call our office to schedule the follow-up appointment.   If you need a refill on your cardiac medications before your next appointment, please call your pharmacy.   Thank you for choosing [facility]! Christen Bame, RN [phone]

## 2016-12-26 ENCOUNTER — Encounter: Payer: Self-pay | Admitting: Family Medicine

## 2016-12-26 ENCOUNTER — Ambulatory Visit (INDEPENDENT_AMBULATORY_CARE_PROVIDER_SITE_OTHER): Payer: Medicare Other | Admitting: Family Medicine

## 2016-12-26 VITALS — BP 106/70 | HR 50 | Temp 97.8°F | Ht 67.0 in | Wt 183.0 lb

## 2016-12-26 DIAGNOSIS — E7800 Pure hypercholesterolemia, unspecified: Secondary | ICD-10-CM | POA: Diagnosis not present

## 2016-12-26 DIAGNOSIS — D509 Iron deficiency anemia, unspecified: Secondary | ICD-10-CM

## 2016-12-26 DIAGNOSIS — R739 Hyperglycemia, unspecified: Secondary | ICD-10-CM | POA: Diagnosis not present

## 2016-12-26 DIAGNOSIS — Z125 Encounter for screening for malignant neoplasm of prostate: Secondary | ICD-10-CM | POA: Diagnosis not present

## 2016-12-26 DIAGNOSIS — F17200 Nicotine dependence, unspecified, uncomplicated: Secondary | ICD-10-CM

## 2016-12-26 DIAGNOSIS — I10 Essential (primary) hypertension: Secondary | ICD-10-CM

## 2016-12-26 NOTE — Patient Instructions (Addendum)
Checking bloodwork today: - for diabetes - prostate cancer screening  We'll do prostate exam at your next visit in 3 months since you didn't want to do this today  Please call your eye doctor to schedule an eye exam  Complete the stool cards and bring them back to [place]  Be well, Dr. Ardelia Mems

## 2016-12-26 NOTE — Progress Notes (Signed)
Date of Visit: 12/26/2016   HPI:  Patient presents for routine follow up.  Hypertension - denies lightheadedness, dizziness, chest pain. Gets mildly shortness of breath if walking up lots of stairs or occasionally walking to mailbox, but overall he can walk a lot in stores without any difficulty. No cough.  Tobacco abuse - continues to smoke both THC and tobacco. Smokes about 5 cigs/day.   CAD/hyperlipidemia - just saw his cardiologist yesterday who got labs, which looked good other than mild hyperglycemia. Agreeable to A1c testing today.  Health maintenance:  - got letter from his GI doctor saying he needed FOBT done here - Agreeable to doing PSA testing after discussion of risk/benefits. Declines prostate examination today. No family history of prostate ca but is african [place]. - vision exam - he got a card reminding him to schedule. For a few months has had some blurry vision/color changes when looking at the tv. No flashing lights. No vision changes currently.  ROS: See HPI.  [place]: history of hyperlipidemia, gout, tobacco abuse, hypertension, CAD, afib  PHYSICAL EXAM: BP 106/70 (BP Location: Left Arm, Patient Position: Sitting, Cuff Size: Normal)   Pulse (!) 50   Temp 97.8 F (36.6 C) (Oral)   Ht 5' 7"  (1.702 m)   Wt 183 lb (83 kg)   SpO2 97%   BMI 28.66 kg/m  Gen: no acute distress, pleasant, cooperative HEENT: normocephalic, atraumatic, mmm Heart:  Regular rate and rhythm, no murmur Lungs: clear to auscultation bilaterally normal work of breathing  Neuro: alert, grossly nonfocal speech normal Ext: no edema  ASSESSMENT/PLAN:  Health maintenance:  -hemoccult ordered today, needs yearly per GI physician at last colonoscopy -A1c ordered for hyperglycemia - though due to a lab error this was not collected. Will contact patient about having it done at next visit. -encouraged to schedule his routine eye exam, especialy in light of the symptoms he's had (not an acute  issue today) -PSA checked today, defer prostate exam to next visit if patient agreeable  HYPERCHOLESTEROLEMIA Well controlled on recent lipids, continue crestor  HYPERTENSION, BENIGN ESSENTIAL Well controlled. Continue current regimen. No adverse side effects despite soft systolic pressure today. Pressure was 161 systolic at cardiology visit yesterday. Will continue present dose.  TOBACCO DEPENDENCE Stressed importance of smoking cessation for his long term health.  FOLLOW UP: Follow up in 3 mos for chronic issues  [place] J. Ardelia Mems, [place]

## 2016-12-27 LAB — PSA: Prostate Specific Ag, Serum: 1.2 ng/mL (ref 0.0–4.0)

## 2016-12-28 NOTE — Assessment & Plan Note (Signed)
Stressed importance of smoking cessation for his long term health.

## 2016-12-28 NOTE — Assessment & Plan Note (Signed)
Well controlled on recent lipids, continue crestor

## 2016-12-28 NOTE — Assessment & Plan Note (Addendum)
Well controlled. Continue current regimen. No adverse side effects despite soft systolic pressure today. Pressure was 550 systolic at cardiology visit yesterday. Will continue present dose.

## 2016-12-29 ENCOUNTER — Encounter: Payer: Self-pay | Admitting: Family Medicine

## 2017-01-07 DIAGNOSIS — D509 Iron deficiency anemia, unspecified: Secondary | ICD-10-CM | POA: Diagnosis not present

## 2017-01-08 NOTE — Addendum Note (Signed)
Addended by: [place] Pink on: 01/08/2017 11:47 AM   Modules accepted: Orders

## 2017-01-10 LAB — FECAL OCCULT BLOOD, IMMUNOCHEMICAL: Fecal Occult Bld: NEGATIVE

## 2017-01-26 ENCOUNTER — Other Ambulatory Visit: Payer: Self-pay | Admitting: *Deleted

## 2017-01-26 MED ORDER — CLOPIDOGREL BISULFATE 75 MG PO TABS: 75.0000 mg | ORAL_TABLET | Freq: Every day | ORAL | 3 refills | Status: DC

## 2017-01-26 NOTE — Telephone Encounter (Signed)
REFILL GIVEN

## 2017-01-29 ENCOUNTER — Ambulatory Visit (INDEPENDENT_AMBULATORY_CARE_PROVIDER_SITE_OTHER): Payer: Medicare Other | Admitting: *Deleted

## 2017-01-29 DIAGNOSIS — I4891 Unspecified atrial fibrillation: Secondary | ICD-10-CM | POA: Diagnosis not present

## 2017-01-29 DIAGNOSIS — Z5181 Encounter for therapeutic drug level monitoring: Secondary | ICD-10-CM | POA: Diagnosis not present

## 2017-01-29 LAB — POCT INR: INR: 2.8

## 2017-01-30 ENCOUNTER — Ambulatory Visit (INDEPENDENT_AMBULATORY_CARE_PROVIDER_SITE_OTHER): Payer: Medicare Other | Admitting: Orthopaedic Surgery

## 2017-01-30 ENCOUNTER — Encounter (INDEPENDENT_AMBULATORY_CARE_PROVIDER_SITE_OTHER): Payer: Self-pay | Admitting: Orthopaedic Surgery

## 2017-01-30 DIAGNOSIS — R202 Paresthesia of skin: Secondary | ICD-10-CM | POA: Insufficient documentation

## 2017-01-30 DIAGNOSIS — R200 Anesthesia of skin: Secondary | ICD-10-CM | POA: Diagnosis not present

## 2017-01-30 NOTE — Progress Notes (Signed)
Office Visit Note   Patient: Jesse Franklin           Date of Birth: 02/06/1951           MRN: [id number] Visit Date: 01/30/2017              Requested by: Leeanne Rio, [place] [address], [place] [id number] PCP: Leeanne Rio, MD   Assessment & Plan: Visit Diagnoses: No diagnosis found.  Plan: Recommend nerve conduction study to evaluate for any permanent nerve damage from the injury. Also evaluate for carpal tunnel syndrome. Follow-up after the studies.  Follow-Up Instructions: Return in about 10 days (around 02/09/2017).   Orders:  No orders of the defined types were placed in this encounter.  No orders of the defined types were placed in this encounter.     Procedures: No procedures performed   Clinical Data: No additional findings.   Subjective: Chief Complaint  Patient presents with  . Right Hand - Pain    Patient is a 66 year old gentleman who had an injury to his right volar midforearm in 1989 that required surgical treatment. He says that he's had persistent numbness in his radial 3 fingers since then. He does have weakness and is often dropping things. Denies any constitutional symptoms.     Review of Systems  Constitutional: Negative.   All other systems reviewed and are negative.    Objective: Vital Signs: There were no vitals taken for this visit.  Physical Exam  Constitutional: He is oriented to person, place, and time. He appears well-developed and well-nourished.  HENT:  Head: Normocephalic and atraumatic.  Eyes: Pupils are equal, round, and reactive to light.  Neck: Neck supple.  Pulmonary/Chest: Effort normal.  Abdominal: Soft.  Musculoskeletal: Normal range of motion.  Neurological: He is alert and oriented to person, place, and time.  Skin: Skin is warm.  Psychiatric: He has a normal mood and affect. His behavior is normal. Judgment and thought content normal.  Nursing note and vitals reviewed.   Ortho  Exam Right forearm exam shows a healed traumatic scar. He does have slight tenderness in this area. He has negative carpal tunnel compressive signs. He has no skin lesions. Specialty Comments:  No specialty comments available.  Imaging: No results found.   PMFS History: Patient Active Problem List   Diagnosis Date Noted  . Proteinuria 11/19/2015  . Stage I pressure ulcer of sacral region 11/12/2014  . Elevated liver enzymes 11/12/2014  . Acute gouty arthritis   . Malnutrition of moderate degree ([place]) 11/05/2014  . 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 Bilateral knee pain 11/03/2014  . Abdominal pain, lower   . Supratherapeutic INR   . Baker's cyst of knee 10/06/2014  . Anginal chest pain at rest [facility]) 07/31/2014  . Nausea with vomiting   . Pulmonary hypertension ([place]) 12/02/2013  . Encounter for therapeutic drug monitoring 11/20/2013  . Hx of long term use of blood thinners 01/11/2013  . Influenza vaccination declined 05/24/2012  . Olecranon bursitis of left elbow 03/18/2012  . Erectile dysfunction 08/25/2011  . Knee pain, bilateral 05/04/2011  . Herpes genitalia 04/13/2011  . Anemia, iron deficiency 05/31/2010  . Gout 04/14/2009  . HYPERTENSION, BENIGN ESSENTIAL 12/11/2006  . HYPERCHOLESTEROLEMIA 11/08/2006  . SCHIZOPHRENIA 11/08/2006  . DEPRESSION, MAJOR, RECURRENT 11/08/2006  . TOBACCO DEPENDENCE 11/08/2006  . CAD (coronary artery disease) 11/08/2006  . Atrial fibrillation ([place]) 11/08/2006  . GASTROESOPHAGEAL REFLUX, NO ESOPHAGITIS 11/08/2006  . BPH 11/08/2006  Past Medical History:  Diagnosis Date  . Alcohol abuse   . Anemia   . Atrial fibrillation ([place])   . BPH 11/08/2006  . CAD (coronary artery disease)    Branch Vessel  . Cocaine abuse    Hx of  . GERD (gastroesophageal reflux disease)   . Gout   . Herpes   . Herpes genitalia 04/13/2011  . Hyperlipidemia   . Hypertension   . Myocardial infarction ([place]) 2005  . RHINITIS, ALLERGIC 11/08/2006  . Tobacco  abuse     Family History  Problem Relation Age of Onset  . Alcohol abuse Father   . Heart disease Brother   . Alcohol abuse Brother   . Cancer Sister   . Hypertension Mother   . Gout Mother   . Arthritis Brother   . Hypertension Sister     Past Surgical History:  Procedure Laterality Date  . CARDIAC CATHETERIZATION  08/2004  . LACERATION REPAIR Right 03/1988   "hand"   Social History   Occupational History  . retired- construction/truck drive Unemployed   Social History Main Topics  . Smoking status: Current Every Day Smoker    Packs/day: 0.10    Years: 48.00    Types: Cigarettes  . Smokeless tobacco: Former Systems developer    Types: [place] date: 09/12/1967     Comment: smoking 3-4 cigs/day  . Alcohol use No     Comment: Pt states that he quit since last gout flare up  . Drug use: Yes    Types: "Crack" cocaine, Marijuana     Comment: past history of cocaine use. Patient admits to marijuana use.   Marland Kitchen Sexual activity: Not Currently

## 2017-01-30 NOTE — Addendum Note (Signed)
Addended by: Precious Bard on: 01/30/2017 10:05 AM   Modules accepted: Orders

## 2017-02-08 ENCOUNTER — Telehealth (INDEPENDENT_AMBULATORY_CARE_PROVIDER_SITE_OTHER): Payer: Self-pay | Admitting: Orthopaedic Surgery

## 2017-02-08 NOTE — Telephone Encounter (Signed)
FAXED 01/30/2017 OV NOTE TO CYN[facility] @ [facility] [phone]

## 2017-02-13 ENCOUNTER — Ambulatory Visit (INDEPENDENT_AMBULATORY_CARE_PROVIDER_SITE_OTHER): Payer: Medicare Other | Admitting: Family Medicine

## 2017-02-13 ENCOUNTER — Encounter: Payer: Self-pay | Admitting: Family Medicine

## 2017-02-13 VITALS — BP 116/62 | HR 61 | Temp 98.2°F | Ht 67.0 in | Wt 178.8 lb

## 2017-02-13 DIAGNOSIS — M109 Gout, unspecified: Secondary | ICD-10-CM

## 2017-02-13 NOTE — Progress Notes (Signed)
Date of Visit: 02/13/2017   HPI:  Patient presents today requesting paperwork be filled out for his apartment. He uses public housing and is attempting to get moved to a first floor apt due to his occasional gout flares which make it difficult for him to climb the stairs. He sees rheumatology, who manages his gout. He just needs a form filled out saying that he does have this condition so that he can submit it to the city. He has no other concerns today, is feeling well, and states he needs to leave quickly to catch the bus.  ROS: See HPI.  [place]: history of hyperlipidemia, gout, tobacco abuse, CAD, afib  PHYSICAL EXAM: BP 116/62   Pulse 61   Temp 98.2 F (36.8 C) (Oral)   Ht 5' 7"  (1.702 m)   Wt 178 lb 12.8 oz (81.1 kg)   SpO2 100%   BMI 28.00 kg/m  Gen: no acute distress, pleasant, cooperative, well appearing Neuro: gait and speech normal  ASSESSMENT/PLAN:  Health maintenance:  -UTD on HM  Gout Managed by rheumatology Reasonable to get him a first floor unit to avoid climbing stairs when gout flares Form will be completed & patient contacted to come pick up the form.  FOLLOW UP: Follow up as needed if symptoms worsen or fail to improve.    [place]. Ardelia Mems, [place]

## 2017-02-16 ENCOUNTER — Ambulatory Visit (INDEPENDENT_AMBULATORY_CARE_PROVIDER_SITE_OTHER): Payer: Medicare Other | Admitting: Physical Medicine and Rehabilitation

## 2017-02-16 ENCOUNTER — Encounter (INDEPENDENT_AMBULATORY_CARE_PROVIDER_SITE_OTHER): Payer: Self-pay | Admitting: Physical Medicine and Rehabilitation

## 2017-02-16 DIAGNOSIS — R202 Paresthesia of skin: Secondary | ICD-10-CM

## 2017-02-16 NOTE — Progress Notes (Deleted)
Right hand feels weak. Numbness- has to shake hand to relieve. First three fingers are the worst.  Right hand dominant. Has had surgery many years ago.

## 2017-02-20 NOTE — Progress Notes (Signed)
Jesse Franklin y.o. male MRN [id number]  Date of birth: [birth year]  Office Visit Note: Visit Date: 02/16/2017 PCP: Jesse Rio, MD Referred by: Jesse Rio, MD  Subjective: Chief Complaint  Patient presents with  . Right Hand - Numbness   HPI: Jesse Franklin is a Franklin year old right-hand-dominant gentleman followed by Dr. Erlinda Franklin for right hand pain with numbness and tingling in the first 3 digits really. He reports an injury in 1989 that required surgery to his volar  mid forearm. He reports since that time he has had numbness tingling in the hand. He reports worsening in recent worsening with weakness. He feels like he drops objects all the time. He has not had prior electrodiagnostic studies. He denies any left-sided complaints. He has no frank radicular symptoms.    ROS Otherwise per HPI.  Assessment & Plan: Visit Diagnoses:  1. Paresthesia of skin     Plan: No additional findings.  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   There is no significant electrodiagnostic evidence of any other focal nerve entrapment, brachial plexopathy or cervical radiculopathy.   Recommendations: 1.  Continue current management of symptoms. 2.  Follow-up with referring physician. 3.  Continue use of resting splint at night-time and as needed during the day. 4.  Suggest surgical evaluation.   Meds & Orders: No orders of the defined types were placed in this encounter.   Orders Placed This Encounter  Procedures  . NCV with EMG (electromyography)    Follow-up: Return for Dr. Erlinda Franklin.   Procedures: No procedures performed  EMG & NCV Findings: Evaluation of the right median motor nerve showed prolonged distal onset latency (4.4 ms) and decreased conduction velocity (Elbow-Wrist, 47 m/s).  The right median (across palm) sensory  nerve showed prolonged distal peak latency (Wrist, 4.0 ms), reduced amplitude (8.7 V), and prolonged distal peak latency (Palm, 2.4 ms).  All remaining nerves (as indicated in the following tables) were within normal limits.    All examined muscles (as indicated in the following table) showed no evidence of electrical instability.    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   There is no significant electrodiagnostic evidence of any other focal nerve entrapment, brachial plexopathy or cervical radiculopathy.   Recommendations: 1.  Continue current management of symptoms. 2.  Follow-up with referring physician. 3.  Continue use of resting splint at night-time and as needed during the day. 4.  Suggest surgical evaluation.    Nerve Conduction Studies Anti Sensory Summary Table   Stim Site NR Peak (ms) Norm Peak (ms) P-T Amp (V) Norm P-T Amp Site1 Site2 Delta-P (ms) Dist (cm) Vel (m/s) Norm Vel (m/s)  Right Median Acr Palm Anti Sensory (2nd Digit)  31C  Wrist    *4.0 <3.6 *8.7 >10 Wrist Palm 1.6 0.0    Palm    *2.4 <2.0 14.3         Right Radial Anti Sensory (Base 1st Digit)  31.6C  Wrist    2.1 <3.1 40.4  Wrist Base 1st Digit 2.1 0.0    Right Ulnar Anti Sensory (5th Digit)  31.3C  Wrist    3.4 <3.7 20.0 >15.0 Wrist 5th Digit 3.4 14.0 41 >38   Motor Summary Table  Stim Site NR Onset (ms) Norm Onset (ms) O-P Amp (mV) Norm O-P Amp Site1 Site2 Delta-0 (ms) Dist (cm) Vel (m/s) Norm Vel (m/s)  Right Median Motor (Abd Poll Brev)  31.8C  Wrist    *4.4 <4.2 5.8 >5 Elbow Wrist 5.1 24.0 *47 >50  Elbow    9.5  5.5         Right Ulnar Motor (Abd Dig Min)  31.7C  Wrist    2.7 <4.2 9.1 >3 B Elbow Wrist 4.4 24.5 56 >53  B Elbow    7.1  9.2  A Elbow B Elbow 1.6 10.0 63 >53  A Elbow    8.7  8.7          EMG   Side Muscle Nerve  Root Ins Act Fibs Psw Amp Dur Poly Recrt Int Fraser Din Comment  Right Abd Poll Brev Median C8-T1 Nml Nml Nml Nml Nml 0 Nml Nml   Right 1stDorInt Ulnar C8-T1 Nml Nml Nml Nml Nml 0 Nml Nml     Nerve Conduction Studies Anti Sensory Left/Right Comparison   Stim Site L Lat (ms) R Lat (ms) L-R Lat (ms) L Amp (V) R Amp (V) L-R Amp (%) Site1 Site2 L Vel (m/s) R Vel (m/s) L-R Vel (m/s)  Median Acr Palm Anti Sensory (2nd Digit)  31C  Wrist  *4.0   *8.7  Wrist Palm     Palm  *2.4   14.3        Radial Anti Sensory (Base 1st Digit)  31.6C  Wrist  2.1   40.4  Wrist Base 1st Digit     Ulnar Anti Sensory (5th Digit)  31.3C  Wrist  3.4   20.0  Wrist 5th Digit  41    Motor Left/Right Comparison   Stim Site L Lat (ms) R Lat (ms) L-R Lat (ms) L Amp (mV) R Amp (mV) L-R Amp (%) Site1 Site2 L Vel (m/s) R Vel (m/s) L-R Vel (m/s)  Median Motor (Abd Poll Brev)  31.8C  Wrist  *4.4   5.8  Elbow Wrist  *47   Elbow  9.5   5.5        Ulnar Motor (Abd Dig Min)  31.7C  Wrist  2.7   9.1  B Elbow Wrist  56   B Elbow  7.1   9.2  A Elbow B Elbow  63   A Elbow  8.7   8.7              Clinical History: No specialty comments available.  He reports that he has been smoking Cigarettes.  He has a 4.80 pack-year smoking history. He quit smokeless tobacco use about 49 years ago. His smokeless tobacco use included Chew.   Recent Labs  08/02/16 0913  LABURIC 4.4    Objective:  VS:  HT:    WT:   BMI:     BP:   HR: bpm  TEMP: ( )  RESP:  Physical Exam  Musculoskeletal:  Inspection reveals well-healed mid forearm surgical scar no atrophy of the bilateral APB or FDI or hand intrinsics. There is no swelling, color changes, allodynia or dystrophic changes. There is 5 out of 5 strength in the bilateral wrist extension, finger abduction and long finger flexion. There is intact sensation to light touch in all dermatomal and peripheral nerve distributions. There is a negative Tinel's test at the bilateral wrist and  elbow. There is a negative Hoffmann's test bilaterally.    Ortho Exam Imaging:  No results found.  Past Medical/Family/Surgical/Social History: Medications & Allergies reviewed per EMR Patient Active Problem List   Diagnosis Date Noted  . Numbness and tingling in right hand 01/30/2017  . Proteinuria 11/19/2015  . Stage I pressure ulcer of sacral region 11/12/2014  . Elevated liver enzymes 11/12/2014  . Acute gouty arthritis   . Malnutrition of moderate degree ([place]) 11/05/2014  . 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 Bilateral knee pain 11/03/2014  . Abdominal pain, lower   . Supratherapeutic INR   . Baker's cyst of knee 10/06/2014  . Anginal chest pain at rest [facility]) 07/31/2014  . Nausea with vomiting   . Pulmonary hypertension ([place]) 12/02/2013  . Encounter for therapeutic drug monitoring 11/20/2013  . Hx of long term use of blood thinners 01/11/2013  . Influenza vaccination declined 05/24/2012  . Olecranon bursitis of left elbow 03/18/2012  . Erectile dysfunction 08/25/2011  . Knee pain, bilateral 05/04/2011  . Herpes genitalia 04/13/2011  . Anemia, iron deficiency 05/31/2010  . Gout 04/14/2009  . HYPERTENSION, BENIGN ESSENTIAL 12/11/2006  . HYPERCHOLESTEROLEMIA 11/08/2006  . SCHIZOPHRENIA 11/08/2006  . DEPRESSION, MAJOR, RECURRENT 11/08/2006  . TOBACCO DEPENDENCE 11/08/2006  . CAD (coronary artery disease) 11/08/2006  . Atrial fibrillation ([place]) 11/08/2006  . GASTROESOPHAGEAL REFLUX, NO ESOPHAGITIS 11/08/2006  . BPH 11/08/2006   Past Medical History:  Diagnosis Date  . Alcohol abuse   . Anemia   . Atrial fibrillation ([place])   . BPH 11/08/2006  . CAD (coronary artery disease)    [place]  . Cocaine abuse    Hx of  . GERD (gastroesophageal reflux disease)   . Gout   . Herpes   . Herpes genitalia 04/13/2011  . Hyperlipidemia   . Hypertension   . Myocardial infarction ([place]) 2005  . RHINITIS, ALLERGIC 11/08/2006  . Tobacco abuse    Family History  Problem  Relation Age of Onset  . Alcohol abuse Father   . Heart disease Brother   . Alcohol abuse Brother   . Cancer Sister   . Hypertension Mother   . Gout Mother   . Arthritis Brother   . Hypertension Sister    Past Surgical History:  Procedure Laterality Date  . CARDIAC CATHETERIZATION  08/2004  . LACERATION REPAIR Right 03/1988   "hand"   Social History   Occupational History  . retired- construction/truck drive Unemployed   Social History Main Topics  . Smoking status: Current Every Day Smoker    Packs/day: 0.10    Years: 48.00    Types: Cigarettes  . Smokeless tobacco: Former Systems developer    Types: [place] date: 09/12/1967     Comment: smoking 3-4 cigs/day  . Alcohol use No     Comment: Pt states that he quit since last gout flare up  . Drug use: Yes    Types: "Crack" cocaine, Marijuana     Comment: past history of cocaine use. Patient admits to marijuana use.   Marland Kitchen Sexual activity: Not Currently

## 2017-02-20 NOTE — Procedures (Signed)
EMG & NCV Findings: Evaluation of the right median motor nerve showed prolonged distal onset latency (4.4 ms) and decreased conduction velocity (Elbow-Wrist, 47 m/s).  The right median (across palm) sensory nerve showed prolonged distal peak latency (Wrist, 4.0 ms), reduced amplitude (8.7 V), and prolonged distal peak latency (Palm, 2.4 ms).  All remaining nerves (as indicated in the following tables) were within normal limits.    All examined muscles (as indicated in the following table) showed no evidence of electrical instability.    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   There is no significant electrodiagnostic evidence of any other focal nerve entrapment, brachial plexopathy or cervical radiculopathy.   Recommendations: 1.  Continue current management of symptoms. 2.  Follow-up with referring physician. 3.  Continue use of resting splint at night-time and as needed during the day. 4.  Suggest surgical evaluation.    Nerve Conduction Studies Anti Sensory Summary Table   Stim Site NR Peak (ms) Norm Peak (ms) P-T Amp (V) Norm P-T Amp Site1 Site2 Delta-P (ms) Dist (cm) Vel (m/s) Norm Vel (m/s)  Right Median Acr Palm Anti Sensory (2nd Digit)  31C  Wrist    *4.0 <3.6 *8.7 >10 Wrist Palm 1.6 0.0    Palm    *2.4 <2.0 14.3         Right Radial Anti Sensory (Base 1st Digit)  31.6C  Wrist    2.1 <3.1 40.4  Wrist Base 1st Digit 2.1 0.0    Right Ulnar Anti Sensory (5th Digit)  31.3C  Wrist    3.4 <3.7 20.0 >15.0 Wrist 5th Digit 3.4 14.0 41 >38   Motor Summary Table   Stim Site NR Onset (ms) Norm Onset (ms) O-P Amp (mV) Norm O-P Amp Site1 Site2 Delta-0 (ms) Dist (cm) Vel (m/s) Norm Vel (m/s)  Right Median Motor (Abd Poll Brev)  31.8C  Wrist    *4.4 <4.2 5.8 >5 Elbow Wrist 5.1 24.0 *47 >50  Elbow    9.5  5.5         Right  Ulnar Motor (Abd Dig Min)  31.7C  Wrist    2.7 <4.2 9.1 >3 B Elbow Wrist 4.4 24.5 56 >53  B Elbow    7.1  9.2  A Elbow B Elbow 1.6 10.0 63 >53  A Elbow    8.7  8.7          EMG   Side Muscle Nerve Root Ins Act Fibs Psw Amp Dur Poly Recrt Int Fraser Din Comment  Right Abd Poll Brev Median C8-T1 Nml Nml Nml Nml Nml 0 Nml Nml   Right 1stDorInt Ulnar C8-T1 Nml Nml Nml Nml Nml 0 Nml Nml     Nerve Conduction Studies Anti Sensory Left/Right Comparison   Stim Site L Lat (ms) R Lat (ms) L-R Lat (ms) L Amp (V) R Amp (V) L-R Amp (%) Site1 Site2 L Vel (m/s) R Vel (m/s) L-R Vel (m/s)  Median Acr Palm Anti Sensory (2nd Digit)  31C  Wrist  *4.0   *8.7  Wrist Palm     Palm  *2.4   14.3        Radial Anti Sensory (Base 1st Digit)  31.6C  Wrist  2.1   40.4  Wrist Base 1st Digit     Ulnar Anti Sensory (5th Digit)  31.3C  Wrist  3.4   20.0  Wrist 5th Digit  41    Motor Left/Right Comparison   Stim Site L Lat (ms) R Lat (ms) L-R Lat (ms) L Amp (mV) R Amp (mV) L-R Amp (%) Site1 Site2 L Vel (m/s) R Vel (m/s) L-R Vel (m/s)  Median Motor (Abd Poll Brev)  31.8C  Wrist  *4.4   5.8  Elbow Wrist  *47   Elbow  9.5   5.5        Ulnar Motor (Abd Dig Min)  31.7C  Wrist  2.7   9.1  B Elbow Wrist  56   B Elbow  7.1   9.2  A Elbow B Elbow  63   A Elbow  8.7   8.7

## 2017-02-22 ENCOUNTER — Ambulatory Visit (INDEPENDENT_AMBULATORY_CARE_PROVIDER_SITE_OTHER): Payer: Medicare Other | Admitting: Orthopaedic Surgery

## 2017-02-22 DIAGNOSIS — G5601 Carpal tunnel syndrome, right upper limb: Secondary | ICD-10-CM

## 2017-02-22 NOTE — Progress Notes (Signed)
Office Visit Note   Patient: Jesse Franklin           Date of Birth: 11/25/1950           MRN: [id number] Visit Date: 02/22/2017              Requested by: Leeanne Rio, [address] [place], [place] [address] PCP: Leeanne Rio, MD   Assessment & Plan: Visit Diagnoses:  1. Right carpal tunnel syndrome     Plan: Patient has moderate carpal tunnel syndrome. She also has an upcoming tests to see if he has any sort of cancer. At this point he'll give [place] a call back when he is ready to discuss further treatment. He does have a history of an MI for which she is on plavix and Coumadin  Follow-Up Instructions: Return if symptoms worsen or fail to improve.   Orders:  No orders of the defined types were placed in this encounter.  No orders of the defined types were placed in this encounter.     Procedures: No procedures performed   Clinical Data: No additional findings.   Subjective: Chief Complaint  Patient presents with  . Follow-up    review EMG/NCV    Patient comes back today to review his nerve conduction studies. This shows that he has moderate carpal tunnel syndrome.    Review of Systems  Constitutional: Negative.   All other systems reviewed and are negative.    Objective: Vital Signs: There were no vitals taken for this visit.  Physical Exam  Constitutional: He is oriented to person, place, and time. He appears well-developed and well-nourished.  Pulmonary/Chest: Effort normal.  Abdominal: Soft.  Neurological: He is alert and oriented to person, place, and time.  Skin: Skin is warm.  Psychiatric: He has a normal mood and affect. His behavior is normal. Judgment and thought content normal.  Nursing note and vitals reviewed.   Ortho Exam Right hand exam shows flattening of the thenar muscles.  Specialty Comments:  No specialty comments available.  Imaging: No results found.   PMFS History: Patient Active Problem List    Diagnosis Date Noted  . Numbness and tingling in right hand 01/30/2017  . Proteinuria 11/19/2015  . Stage I pressure ulcer of sacral region 11/12/2014  . Elevated liver enzymes 11/12/2014  . Acute gouty arthritis   . Malnutrition of moderate degree ([place]) 11/05/2014  . 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 Bilateral knee pain 11/03/2014  . Abdominal pain, lower   . Supratherapeutic INR   . Baker's cyst of knee 10/06/2014  . Anginal chest pain at rest [facility]) 07/31/2014  . Nausea with vomiting   . Pulmonary hypertension ([place]) 12/02/2013  . Encounter for therapeutic drug monitoring 11/20/2013  . Hx of long term use of blood thinners 01/11/2013  . Influenza vaccination declined 05/24/2012  . Olecranon bursitis of left elbow 03/18/2012  . Erectile dysfunction 08/25/2011  . Knee pain, bilateral 05/04/2011  . Herpes genitalia 04/13/2011  . Anemia, iron deficiency 05/31/2010  . Gout 04/14/2009  . HYPERTENSION, BENIGN ESSENTIAL 12/11/2006  . HYPERCHOLESTEROLEMIA 11/08/2006  . SCHIZOPHRENIA 11/08/2006  . DEPRESSION, MAJOR, RECURRENT 11/08/2006  . TOBACCO DEPENDENCE 11/08/2006  . CAD (coronary artery disease) 11/08/2006  . Atrial fibrillation ([place]) 11/08/2006  . GASTROESOPHAGEAL REFLUX, NO ESOPHAGITIS 11/08/2006  . BPH 11/08/2006   Past Medical History:  Diagnosis Date  . Alcohol abuse   . Anemia   . Atrial fibrillation ([place])   . BPH 11/08/2006  .  CAD (coronary artery disease)    [place]essel  . Cocaine abuse    Hx of  . GERD (gastroesophageal reflux disease)   . Gout   . Herpes   . Herpes genitalia 04/13/2011  . Hyperlipidemia   . Hypertension   . Myocardial infarction ([place]) 2005  . RHINITIS, ALLERGIC 11/08/2006  . Tobacco abuse     Family History  Problem Relation Age of Onset  . Alcohol abuse Father   . Heart disease Brother   . Alcohol abuse Brother   . Cancer Sister   . Hypertension Mother   . Gout Mother   . Arthritis Brother   . Hypertension Sister     Past  Surgical History:  Procedure Laterality Date  . CARDIAC CATHETERIZATION  08/2004  . LACERATION REPAIR Right 03/1988   "hand"   Social History   Occupational History  . retired- construction/truck drive Unemployed   Social History Main Topics  . Smoking status: Current Every Day Smoker    Packs/day: 0.10    Years: 48.00    Types: Cigarettes  . Smokeless tobacco: Former Systems developer    Types: [place] date: 09/12/1967     Comment: smoking 3-4 cigs/day  . Alcohol use No     Comment: Pt states that he quit since last gout flare up  . Drug use: Yes    Types: "Crack" cocaine, Marijuana     Comment: past history of cocaine use. Patient admits to marijuana use.   Marland Kitchen Sexual activity: Not Currently

## 2017-03-06 DIAGNOSIS — Z8719 Personal history of other diseases of the digestive system: Secondary | ICD-10-CM | POA: Diagnosis not present

## 2017-03-06 DIAGNOSIS — I482 Chronic atrial fibrillation: Secondary | ICD-10-CM | POA: Diagnosis not present

## 2017-03-06 DIAGNOSIS — Z7901 Long term (current) use of anticoagulants: Secondary | ICD-10-CM | POA: Diagnosis not present

## 2017-03-12 ENCOUNTER — Ambulatory Visit (INDEPENDENT_AMBULATORY_CARE_PROVIDER_SITE_OTHER): Payer: Medicare Other | Admitting: *Deleted

## 2017-03-12 ENCOUNTER — Telehealth (INDEPENDENT_AMBULATORY_CARE_PROVIDER_SITE_OTHER): Payer: Self-pay | Admitting: Radiology

## 2017-03-12 DIAGNOSIS — I4891 Unspecified atrial fibrillation: Secondary | ICD-10-CM

## 2017-03-12 DIAGNOSIS — Z5181 Encounter for therapeutic drug level monitoring: Secondary | ICD-10-CM

## 2017-03-12 LAB — POCT INR: INR: 2.7

## 2017-03-12 NOTE — Telephone Encounter (Signed)
Jesse Franklin called stating patient is going to call for visit with Dr. Erlinda Hong to set up right wrist surgery.  Patients cardiologist is Dr. Mertie Moores.  Information will need to be faxed to [phone] to get approval to hold patients cumadin.

## 2017-03-20 ENCOUNTER — Telehealth (INDEPENDENT_AMBULATORY_CARE_PROVIDER_SITE_OTHER): Payer: Self-pay

## 2017-03-20 ENCOUNTER — Telehealth: Payer: Self-pay | Admitting: Pharmacist

## 2017-03-20 NOTE — Telephone Encounter (Signed)
Patient left voice mails inquiring about scheduling surgery.  [phone]

## 2017-03-20 NOTE — Telephone Encounter (Signed)
Received clearance request from [facility] that pt requires right carpal tunnel release. Pt takes warfarin for afib with CHADS2VASc score of 2 (HTN and CAD). Ok to hold warfarin for 5 days prior to procedure.   To Dr Acie Fredrickson to provide cardiac clearance (required by [place]).

## 2017-03-20 NOTE — Telephone Encounter (Signed)
Dr. Erlinda Hong wrote surgery order.  Per note in chart, faxed note to cardiologist for clearance and blood thinner instructions.  I called patient and left voice mail for return call to advise.  [phone]

## 2017-03-21 NOTE — Telephone Encounter (Signed)
Pt is at moderate risk for CV complications Has pulmonary HTN with ongoing cigarette smoking.   Is likely at higher risk for pulmonary issues than cardiac issues.

## 2017-03-21 NOTE — Telephone Encounter (Signed)
Faxed to [employer] at [employer]: [phone]

## 2017-03-29 ENCOUNTER — Encounter (HOSPITAL_BASED_OUTPATIENT_CLINIC_OR_DEPARTMENT_OTHER): Payer: Self-pay | Admitting: *Deleted

## 2017-03-29 NOTE — Progress Notes (Signed)
Pt. States he is supposed to be off coumadin and plavix 5 days prior to his surgery.  Pt. Also admits to having just used marijuana but he says he understands he needs to come the day before surgery to get a BMET and INR . He is also aware he needs to have 24 hour caregiver post surgery as well.  Surgery has been approved at our center per Dr. Gifford Shave.

## 2017-03-30 ENCOUNTER — Other Ambulatory Visit (INDEPENDENT_AMBULATORY_CARE_PROVIDER_SITE_OTHER): Payer: Self-pay | Admitting: Orthopaedic Surgery

## 2017-03-30 DIAGNOSIS — G5601 Carpal tunnel syndrome, right upper limb: Secondary | ICD-10-CM

## 2017-04-03 ENCOUNTER — Encounter (HOSPITAL_BASED_OUTPATIENT_CLINIC_OR_DEPARTMENT_OTHER)
Admission: RE | Admit: 2017-04-03 | Discharge: 2017-04-03 | Disposition: A | Discharge status: Home / Self Care | Payer: Medicare Other | Source: Ambulatory Visit | Attending: Orthopaedic Surgery | Admitting: Orthopaedic Surgery

## 2017-04-03 DIAGNOSIS — Z7902 Long term (current) use of antithrombotics/antiplatelets: Secondary | ICD-10-CM | POA: Diagnosis not present

## 2017-04-03 DIAGNOSIS — K219 Gastro-esophageal reflux disease without esophagitis: Secondary | ICD-10-CM | POA: Diagnosis not present

## 2017-04-03 DIAGNOSIS — Z79899 Other long term (current) drug therapy: Secondary | ICD-10-CM | POA: Diagnosis not present

## 2017-04-03 DIAGNOSIS — Z885 Allergy status to narcotic agent status: Secondary | ICD-10-CM | POA: Diagnosis not present

## 2017-04-03 DIAGNOSIS — I252 Old myocardial infarction: Secondary | ICD-10-CM | POA: Diagnosis not present

## 2017-04-03 DIAGNOSIS — Z87891 Personal history of nicotine dependence: Secondary | ICD-10-CM | POA: Diagnosis not present

## 2017-04-03 DIAGNOSIS — G5601 Carpal tunnel syndrome, right upper limb: Secondary | ICD-10-CM | POA: Diagnosis not present

## 2017-04-03 DIAGNOSIS — F329 Major depressive disorder, single episode, unspecified: Secondary | ICD-10-CM | POA: Diagnosis not present

## 2017-04-03 DIAGNOSIS — Z888 Allergy status to other drugs, medicaments and biological substances status: Secondary | ICD-10-CM | POA: Diagnosis not present

## 2017-04-03 DIAGNOSIS — I4891 Unspecified atrial fibrillation: Secondary | ICD-10-CM | POA: Diagnosis not present

## 2017-04-03 DIAGNOSIS — I10 Essential (primary) hypertension: Secondary | ICD-10-CM | POA: Diagnosis not present

## 2017-04-03 DIAGNOSIS — E785 Hyperlipidemia, unspecified: Secondary | ICD-10-CM | POA: Diagnosis not present

## 2017-04-03 DIAGNOSIS — F209 Schizophrenia, unspecified: Secondary | ICD-10-CM | POA: Diagnosis not present

## 2017-04-03 DIAGNOSIS — Z7901 Long term (current) use of anticoagulants: Secondary | ICD-10-CM | POA: Diagnosis not present

## 2017-04-03 DIAGNOSIS — F1290 Cannabis use, unspecified, uncomplicated: Secondary | ICD-10-CM | POA: Diagnosis not present

## 2017-04-03 DIAGNOSIS — I2510 Atherosclerotic heart disease of native coronary artery without angina pectoris: Secondary | ICD-10-CM | POA: Diagnosis not present

## 2017-04-03 DIAGNOSIS — M109 Gout, unspecified: Secondary | ICD-10-CM | POA: Diagnosis not present

## 2017-04-03 DIAGNOSIS — N400 Enlarged prostate without lower urinary tract symptoms: Secondary | ICD-10-CM | POA: Diagnosis not present

## 2017-04-03 LAB — BASIC METABOLIC PANEL
Anion gap: 9 (ref 5–15)
BUN: 10 mg/dL (ref 6–20)
CO2: 23 mmol/L (ref 22–32)
CREATININE: 1.12 mg/dL (ref 0.61–1.24)
Calcium: 9.7 mg/dL (ref 8.9–10.3)
Chloride: 110 mmol/L (ref 101–111)
GFR calc Af Amer: >60 mL/min (ref >60)
Glucose, Bld: 124 mg/dL — ABNORMAL HIGH (ref 65–99)
POTASSIUM: 3.6 mmol/L (ref 3.5–5.1)
SODIUM: 142 mmol/L (ref 135–145)

## 2017-04-03 LAB — PROTIME-INR
INR: 1.12
Prothrombin Time: 14.4 seconds (ref 11.4–15.2)

## 2017-04-04 ENCOUNTER — Ambulatory Visit (HOSPITAL_BASED_OUTPATIENT_CLINIC_OR_DEPARTMENT_OTHER): Payer: Medicare Other | Admitting: Anesthesiology

## 2017-04-04 ENCOUNTER — Encounter (HOSPITAL_BASED_OUTPATIENT_CLINIC_OR_DEPARTMENT_OTHER): Payer: Self-pay | Admitting: Anesthesiology

## 2017-04-04 ENCOUNTER — Ambulatory Visit (HOSPITAL_BASED_OUTPATIENT_CLINIC_OR_DEPARTMENT_OTHER)
Admission: RE | Admit: 2017-04-04 | Discharge: 2017-04-04 | Disposition: A | Discharge status: Home / Self Care | Payer: Medicare Other | Source: Ambulatory Visit | Attending: Orthopaedic Surgery | Admitting: Orthopaedic Surgery

## 2017-04-04 ENCOUNTER — Encounter (HOSPITAL_BASED_OUTPATIENT_CLINIC_OR_DEPARTMENT_OTHER)
Admission: RE | Disposition: A | Discharge status: Home / Self Care | Payer: Self-pay | Source: Ambulatory Visit | Attending: Orthopaedic Surgery

## 2017-04-04 DIAGNOSIS — E785 Hyperlipidemia, unspecified: Secondary | ICD-10-CM | POA: Diagnosis not present

## 2017-04-04 DIAGNOSIS — E7800 Pure hypercholesterolemia, unspecified: Secondary | ICD-10-CM | POA: Diagnosis not present

## 2017-04-04 DIAGNOSIS — I2510 Atherosclerotic heart disease of native coronary artery without angina pectoris: Secondary | ICD-10-CM | POA: Insufficient documentation

## 2017-04-04 DIAGNOSIS — I252 Old myocardial infarction: Secondary | ICD-10-CM | POA: Diagnosis not present

## 2017-04-04 DIAGNOSIS — G5601 Carpal tunnel syndrome, right upper limb: Secondary | ICD-10-CM | POA: Insufficient documentation

## 2017-04-04 DIAGNOSIS — F329 Major depressive disorder, single episode, unspecified: Secondary | ICD-10-CM | POA: Insufficient documentation

## 2017-04-04 DIAGNOSIS — F1290 Cannabis use, unspecified, uncomplicated: Secondary | ICD-10-CM | POA: Insufficient documentation

## 2017-04-04 DIAGNOSIS — I4891 Unspecified atrial fibrillation: Secondary | ICD-10-CM | POA: Diagnosis not present

## 2017-04-04 DIAGNOSIS — M109 Gout, unspecified: Secondary | ICD-10-CM | POA: Insufficient documentation

## 2017-04-04 DIAGNOSIS — Z87891 Personal history of nicotine dependence: Secondary | ICD-10-CM | POA: Diagnosis not present

## 2017-04-04 DIAGNOSIS — I10 Essential (primary) hypertension: Secondary | ICD-10-CM | POA: Insufficient documentation

## 2017-04-04 DIAGNOSIS — Z7901 Long term (current) use of anticoagulants: Secondary | ICD-10-CM | POA: Diagnosis not present

## 2017-04-04 DIAGNOSIS — K219 Gastro-esophageal reflux disease without esophagitis: Secondary | ICD-10-CM | POA: Diagnosis not present

## 2017-04-04 DIAGNOSIS — N400 Enlarged prostate without lower urinary tract symptoms: Secondary | ICD-10-CM | POA: Diagnosis not present

## 2017-04-04 DIAGNOSIS — Z885 Allergy status to narcotic agent status: Secondary | ICD-10-CM | POA: Insufficient documentation

## 2017-04-04 DIAGNOSIS — Z7902 Long term (current) use of antithrombotics/antiplatelets: Secondary | ICD-10-CM | POA: Diagnosis not present

## 2017-04-04 DIAGNOSIS — Z888 Allergy status to other drugs, medicaments and biological substances status: Secondary | ICD-10-CM | POA: Insufficient documentation

## 2017-04-04 DIAGNOSIS — F209 Schizophrenia, unspecified: Secondary | ICD-10-CM | POA: Insufficient documentation

## 2017-04-04 DIAGNOSIS — Z79899 Other long term (current) drug therapy: Secondary | ICD-10-CM | POA: Insufficient documentation

## 2017-04-04 HISTORY — PX: CARPAL TUNNEL RELEASE: SHX101

## 2017-04-04 SURGERY — CARPAL TUNNEL RELEASE
Anesthesia: General | Site: Hand | Laterality: Right

## 2017-04-04 MED ORDER — ONDANSETRON HCL 4 MG/2ML IJ SOLN
INTRAMUSCULAR | Status: DC | PRN
  Administered 2017-04-04: 4 mg via INTRAVENOUS

## 2017-04-04 MED ORDER — CEFAZOLIN SODIUM-DEXTROSE 2-4 GM/100ML-% IV SOLN
INTRAVENOUS | Status: AC
  Filled 2017-04-04: qty 100

## 2017-04-04 MED ORDER — LACTATED RINGERS IV SOLN
INTRAVENOUS | Status: DC
  Administered 2017-04-04: 08:00:00 via INTRAVENOUS

## 2017-04-04 MED ORDER — PROMETHAZINE HCL 25 MG/ML IJ SOLN: 6.2500 mg | INTRAMUSCULAR | Status: DC | PRN

## 2017-04-04 MED ORDER — MIDAZOLAM HCL 2 MG/2ML IJ SOLN
1.0000 mg | INTRAMUSCULAR | Status: DC | PRN
  Administered 2017-04-04: 2 mg via INTRAVENOUS

## 2017-04-04 MED ORDER — HYDROCODONE-ACETAMINOPHEN 5-325 MG PO TABS: 1.0000 | ORAL_TABLET | Freq: Two times a day (BID) | ORAL | 0 refills | Status: DC | PRN

## 2017-04-04 MED ORDER — MIDAZOLAM HCL 2 MG/2ML IJ SOLN
INTRAMUSCULAR | Status: AC
  Filled 2017-04-04: qty 2

## 2017-04-04 MED ORDER — MEPERIDINE HCL 25 MG/ML IJ SOLN: 6.2500 mg | INTRAMUSCULAR | Status: DC | PRN

## 2017-04-04 MED ORDER — 0.9 % SODIUM CHLORIDE (POUR BTL) OPTIME
TOPICAL | Status: DC | PRN
  Administered 2017-04-04: 700 mL

## 2017-04-04 MED ORDER — CEFAZOLIN SODIUM-DEXTROSE 2-4 GM/100ML-% IV SOLN
2.0000 g | INTRAVENOUS | Status: AC
  Administered 2017-04-04: 2 g via INTRAVENOUS

## 2017-04-04 MED ORDER — BUPIVACAINE HCL (PF) 0.25 % IJ SOLN
INTRAMUSCULAR | Status: DC | PRN
  Administered 2017-04-04: 9 mL

## 2017-04-04 MED ORDER — SCOPOLAMINE 1 MG/3DAYS TD PT72: 1.0000 | MEDICATED_PATCH | Freq: Once | TRANSDERMAL | Status: DC | PRN

## 2017-04-04 MED ORDER — DEXAMETHASONE SODIUM PHOSPHATE 10 MG/ML IJ SOLN
INTRAMUSCULAR | Status: DC | PRN
  Administered 2017-04-04: 10 mg via INTRAVENOUS

## 2017-04-04 MED ORDER — FENTANYL CITRATE (PF) 100 MCG/2ML IJ SOLN
INTRAMUSCULAR | Status: AC
  Filled 2017-04-04: qty 2

## 2017-04-04 MED ORDER — FENTANYL CITRATE (PF) 100 MCG/2ML IJ SOLN: 25.0000 ug | INTRAMUSCULAR | Status: DC | PRN

## 2017-04-04 MED ORDER — FENTANYL CITRATE (PF) 100 MCG/2ML IJ SOLN
50.0000 ug | INTRAMUSCULAR | Status: DC | PRN
  Administered 2017-04-04: 100 ug via INTRAVENOUS

## 2017-04-04 MED ORDER — LIDOCAINE HCL (CARDIAC) 20 MG/ML IV SOLN
INTRAVENOUS | Status: DC | PRN
  Administered 2017-04-04: 50 mg via INTRAVENOUS

## 2017-04-04 SURGICAL SUPPLY — 43 items
BANDAGE ACE 3X5.8 VEL STRL LF (GAUZE/BANDAGES/DRESSINGS) ×2 IMPLANT
BLADE MINI RND TIP GREEN BEAV (BLADE) ×2 IMPLANT
BLADE SURG 15 STRL LF DISP TIS (BLADE) ×1 IMPLANT
BLADE SURG 15 STRL SS (BLADE) ×2
BNDG CMPR 9X4 STRL LF SNTH (GAUZE/BANDAGES/DRESSINGS) ×1
BNDG ESMARK 4X9 LF (GAUZE/BANDAGES/DRESSINGS) ×2 IMPLANT
BNDG PLASTER X FAST 3X3 WHT LF (CAST SUPPLIES) IMPLANT
BNDG PLSTR 9X3 FST ST WHT (CAST SUPPLIES)
BRUSH SCRUB EZ PLAIN DRY (MISCELLANEOUS) ×2 IMPLANT
CORD BIPOLAR FORCEPS 12FT (ELECTRODE) ×2 IMPLANT
COVER BACK TABLE 60X90IN (DRAPES) ×2 IMPLANT
COVER MAYO STAND STRL (DRAPES) ×2 IMPLANT
CUFF TOURNIQUET SINGLE 18IN (TOURNIQUET CUFF) ×2 IMPLANT
DECANTER SPIKE VIAL GLASS SM (MISCELLANEOUS) IMPLANT
DRAPE EXTREMITY T 121X128X90 (DRAPE) ×2 IMPLANT
DRAPE SURG 17X23 STRL (DRAPES) ×2 IMPLANT
GAUZE SPONGE 4X4 12PLY STRL (GAUZE/BANDAGES/DRESSINGS) ×2 IMPLANT
GAUZE SPONGE 4X4 16PLY XRAY LF (GAUZE/BANDAGES/DRESSINGS) IMPLANT
GAUZE XEROFORM 1X8 LF (GAUZE/BANDAGES/DRESSINGS) ×2 IMPLANT
GLOVE BIO SURGEON STRL SZ 6.5 (GLOVE) ×2 IMPLANT
GLOVE BIOGEL PI IND STRL 7.0 (GLOVE) ×2 IMPLANT
GLOVE BIOGEL PI INDICATOR 7.0 (GLOVE) ×2
GLOVE SKINSENSE NS SZ7.5 (GLOVE) ×1
GLOVE SKINSENSE STRL SZ7.5 (GLOVE) ×1 IMPLANT
GLOVE SURG SYN 7.5  E (GLOVE) ×1
GLOVE SURG SYN 7.5 E (GLOVE) ×1 IMPLANT
GOWN SRG XL LVL 4 BRTHBL STRL (GOWNS) ×1 IMPLANT
GOWN STRL NON-REIN XL LVL4 (GOWNS) ×2
GOWN STRL REIN XL XLG (GOWN DISPOSABLE) ×2 IMPLANT
NEEDLE HYPO 22GX1.5 SAFETY (NEEDLE) ×2 IMPLANT
NS IRRIG 1000ML POUR BTL (IV SOLUTION) ×2 IMPLANT
PACK BASIN DAY SURGERY FS (CUSTOM PROCEDURE TRAY) ×2 IMPLANT
PAD CAST 3X4 CTTN HI CHSV (CAST SUPPLIES) ×1 IMPLANT
PADDING CAST COTTON 3X4 STRL (CAST SUPPLIES) ×1
RUBBERBAND STERILE (MISCELLANEOUS) ×4 IMPLANT
SPLINT FIBERGLASS 3X35 (CAST SUPPLIES) ×2 IMPLANT
STOCKINETTE 4X48 STRL (DRAPES) ×2 IMPLANT
SUT ETHILON 4 0 PS 2 18 (SUTURE) ×2 IMPLANT
SYR BULB 3OZ (MISCELLANEOUS) ×2 IMPLANT
SYR CONTROL 10ML LL (SYRINGE) ×2 IMPLANT
TOWEL OR 17X24 6PK STRL BLUE (TOWEL DISPOSABLE) ×2 IMPLANT
TRAY DSU PREP LF (CUSTOM PROCEDURE TRAY) ×2 IMPLANT
UNDERPAD 30X30 (UNDERPADS AND DIAPERS) ×2 IMPLANT

## 2017-04-04 NOTE — H&P (Signed)
PREOPERATIVE H&P  Chief Complaint: right carpal tunnel syndrome  HPI: Jesse Franklin is a 66 y.o. male who presents for surgical treatment of right carpal tunnel syndrome.  He denies any changes in medical history.  Past Medical History:  Diagnosis Date  . Alcohol abuse   . Anemia   . Atrial fibrillation ([place])   . BPH 11/08/2006  . CAD (coronary artery disease)    [place]essel  . Cocaine abuse    Hx of  . GERD (gastroesophageal reflux disease)   . Gout   . Herpes   . Herpes genitalia 04/13/2011  . Hyperlipidemia   . Hypertension   . Myocardial infarction ([place]) 2005  . RHINITIS, ALLERGIC 11/08/2006  . Tobacco abuse    Past Surgical History:  Procedure Laterality Date  . CARDIAC CATHETERIZATION  08/2004  . LACERATION REPAIR Right 03/1988   "hand"   Social History   Social History  . Marital status: Divorced    Spouse name: N/A  . Number of children: 0  . Years of education: 8   Occupational History  . retired- construction/truck drive Unemployed   Social History Main Topics  . Smoking status: Current Every Day Smoker    Packs/day: 0.25    Years: 48.00    Types: Cigarettes  . Smokeless tobacco: Former Systems developer    Types: [place] date: 09/12/1967     Comment: smoking 3-4 cigs/day  . Alcohol use No     Comment: Pt states that he quit since last gout flare up  . Drug use: Yes    Types: "Crack" cocaine, Marijuana     Comment: past history of cocaine use. Patient admits to marijuana use.   Marland Kitchen Sexual activity: Not Currently   Other Topics Concern  . None   Social History Narrative   Health Care POA:    Emergency Contact: Cathren Laine, [phone]   End of Life Plan: gave pt AD pamphlet   Who lives with you: self   Any pets: none   Diet: Pt has a varied diet, but recently reports lacking appetite especially when    Exercise: Pt reports walking 20 minutes almost every day.   Seatbelts: Pt reports wearing seatbelt when in vehicles.    Hobbies:  walking, watching tv            Family History  Problem Relation Age of Onset  . Alcohol abuse Father   . Heart disease Brother   . Alcohol abuse Brother   . Cancer Sister   . Hypertension Mother   . Gout Mother   . Arthritis Brother   . Hypertension Sister    Allergies  Allergen Reactions  . Codeine Hives  . Ketorolac Tromethamine     REACTION: hives   Prior to Admission medications   Medication Sig Start Date End Date Taking? Authorizing Provider  allopurinol (ZYLOPRIM) 100 MG tablet Take 100 mg by mouth 2 (two) times daily.    Yes [provider]  amLODipine (NORVASC) 10 MG tablet TAKE 1 TABLET (10 MG TOTAL) BY MOUTH DAILY. 07/20/16  Yes Leeanne Rio, MD  clopidogrel (PLAVIX) 75 MG tablet Take 1 tablet (75 mg total) by mouth daily. 01/26/17  Yes Nahser, Wonda Cheng, MD  colchicine 0.6 MG tablet TAKE 1 TABLET BY MOUTH TWICE A DAY 07/31/16  Yes Leeanne Rio, MD  ferrous sulfate 325 (65 FE) MG tablet TAKE 1 TABLET BY MOUTH EVERY DAY WITH BREAKFAST 09/06/16  Yes Leeanne Rio, MD  pantoprazole (PROTONIX) 40 MG tablet Take 1 tablet (40 mg total) by mouth daily. 07/20/16  Yes Leeanne Rio, MD  rosuvastatin (CRESTOR) 40 MG tablet TAKE 1 TABLET BY MOUTH EVERY DAY 09/20/16  Yes Leeanne Rio, MD  warfarin (COUMADIN) 5 MG tablet TAKE AS DIRECTED BY COUMADIN CLINIC 10/16/16  Yes Nahser, Wonda Cheng, MD  nitroGLYCERIN (NITROSTAT) 0.4 MG SL tablet Place 1 tablet (0.4 mg total) under the tongue as directed. Place 1 tab under tongue as directed 02/20/12   Nahser, Wonda Cheng, MD     Positive ROS: All other systems have been reviewed and were otherwise negative with the exception of those mentioned in the HPI and as above.  Physical Exam: General: Alert, no acute distress Cardiovascular: No pedal edema Respiratory: No cyanosis, no use of accessory musculature GI: abdomen soft Skin: No lesions in the area of chief complaint Neurologic: Sensation intact  distally Psychiatric: Patient is competent for consent with normal mood and affect Lymphatic: no lymphedema  MUSCULOSKELETAL: exam stable  Assessment: right carpal tunnel syndrome  Plan: Plan for Procedure(s): RIGHT CARPAL TUNNEL RELEASE  The risks benefits and alternatives were discussed with the patient including but not limited to the risks of nonoperative treatment, versus surgical intervention including infection, bleeding, nerve injury,  blood clots, cardiopulmonary complications, morbidity, mortality, among others, and they were willing to proceed.   Eduard Roux, MD   04/04/2017 8:26 AM

## 2017-04-04 NOTE — Discharge Instructions (Signed)
Postoperative instructions:  Weightbearing instructions: as tolerated.  No heavy lifting  Dressing instructions: Keep your dressing and/or splint clean and dry at all times.  It will be removed at your first post-operative appointment.  Your stitches and/or staples will be removed at this visit.  Incision instructions:  Do not soak your incision for 3 weeks after surgery.  If the incision gets wet, pat dry and do not scrub the incision.  Pain control:  You have been given a prescription to be taken as directed for post-operative pain control.  In addition, elevate the operative extremity above the heart at all times to prevent swelling and throbbing pain.  Take over-the-counter Colace, 139m by mouth twice a day while taking narcotic pain medications to help prevent constipation.  Follow up appointments: 1) 10-14 days for suture removal and wound check. 2) Dr. XErlinda Hongas scheduled.   -------------------------------------------------------------------------------------------------------------  After Surgery Pain Control:  After your surgery, post-surgical discomfort or pain is likely. This discomfort can last several days to a few weeks. At certain times of the day your discomfort may be more intense.  Did you receive a nerve block?  A nerve block can provide pain relief for one hour to two days after your surgery. As long as the nerve block is working, you will experience little or no sensation in the area the surgeon operated on.  As the nerve block wears off, you will begin to experience pain or discomfort. It is very important that you begin taking your prescribed pain medication before the nerve block fully wears off. Treating your pain at the first sign of the block wearing off will ensure your pain is better controlled and more tolerable when full-sensation returns. Do not wait until the pain is intolerable, as the medicine will be less effective. It is better to treat pain in advance than to  try and catch up.  General Anesthesia:  If you did not receive a nerve block during your surgery, you will need to start taking your pain medication shortly after your surgery and should continue to do so as prescribed by your surgeon.  Pain Medication:  Most commonly we prescribe Vicodin and Percocet for post-operative pain. Both of these medications contain a combination of acetaminophen (Tylenol) and a narcotic to help control pain.   It takes between 30 and 45 minutes before pain medication starts to work. It is important to take your medication before your pain level gets too intense.   Nausea is a common side effect of many pain medications. You will want to eat something before taking your pain medicine to help prevent nausea.   If you are taking a prescription pain medication that contains acetaminophen, we recommend that you do not take additional over the counter acetaminophen (Tylenol).  Other pain relieving options:   Using a cold pack to ice the affected area a few times a day (15 to 20 minutes at a time) can help to relieve pain, reduce swelling and bruising.   Elevation of the affected area can also help to reduce pain and swelling.      Post Anesthesia Home Care Instructions  Activity: Get plenty of rest for the remainder of the day. A responsible individual must stay with you for 24 hours following the procedure.  For the next 24 hours, DO NOT: -Drive a car -OPaediatric nurse-Drink alcoholic beverages -Take any medication unless instructed by your physician -Make any legal decisions or sign important papers.  Meals: Start with liquid  foods such as gelatin or soup. Progress to regular foods as tolerated. Avoid greasy, spicy, heavy foods. If nausea and/or vomiting occur, drink only clear liquids until the nausea and/or vomiting subsides. Call your physician if vomiting continues.  Special Instructions/Symptoms: Your throat may feel dry or sore from the anesthesia  or the breathing tube placed in your throat during surgery. If this causes discomfort, gargle with warm salt water. The discomfort should disappear within 24 hours.  If you had a scopolamine patch placed behind your ear for the management of post- operative nausea and/or vomiting:  1. The medication in the patch is effective for 72 hours, after which it should be removed.  Wrap patch in a tissue and discard in the trash. Wash hands thoroughly with soap and water. 2. You may remove the patch earlier than 72 hours if you experience unpleasant side effects which may include dry mouth, dizziness or visual disturbances. 3. Avoid touching the patch. Wash your hands with soap and water after contact with the patch.

## 2017-04-04 NOTE — Anesthesia Procedure Notes (Signed)
Procedure Name: LMA Insertion Date/Time: 04/04/2017 9:39 AM Performed by: Melynda Ripple D Pre-anesthesia Checklist: Patient identified, Emergency Drugs available, Suction available and Patient being monitored Patient Re-evaluated:Patient Re-evaluated prior to induction Oxygen Delivery Method: Circle system utilized Preoxygenation: Pre-oxygenation with 100% oxygen Induction Type: IV induction Ventilation: Mask ventilation without difficulty LMA: LMA inserted LMA Size: 4.0 Number of attempts: 1 Airway Equipment and Method: Bite block Placement Confirmation: positive ETCO2 Tube secured with: Tape Dental Injury: Teeth and Oropharynx as per pre-operative assessment

## 2017-04-04 NOTE — Anesthesia Procedure Notes (Signed)
Procedure Name: LMA Insertion Performed by: Melynda Ripple D Pre-anesthesia Checklist: Patient identified, Emergency Drugs available, Suction available and Patient being monitored Patient Re-evaluated:Patient Re-evaluated prior to induction Oxygen Delivery Method: Circle system utilized Preoxygenation: Pre-oxygenation with 100% oxygen Induction Type: IV induction Ventilation: Mask ventilation without difficulty LMA: LMA inserted LMA Size: 4.0 Number of attempts: 1 Airway Equipment and Method: Bite block Placement Confirmation: positive ETCO2 Tube secured with: Tape Dental Injury: Teeth and Oropharynx as per pre-operative assessment

## 2017-04-04 NOTE — Op Note (Signed)
   Carpal tunnel op note  DATE OF SURGERY:04/04/2017  PREOPERATIVE DIAGNOSIS:  Right Carpal tunnel syndrome  POSTOPERATIVE DIAGNOSIS: same  PROCEDURE:  Right carpal tunnel release. CPT [id number]  SURGEON: Surgeon(s): Leandrew Koyanagi, MD  ANESTHESIA:  General  TOURNIQUET TIME: less than 10 minutes  BLOOD LOSS: Minimal.  COMPLICATIONS: None.  PATHOLOGY: None.  INDICATIONS: The patient is a 66 y.o. -year-old male who presented with carpal tunnel syndrome failing nonsurgical management, indicated for surgical release.  DESCRIPTION OF PROCEDURE: The patient was identified in the preoperative holding area.  The operative site was marked by the surgeon and confirmed by the patient.  He was brought back to the operating room.  Anesthesia was induced by the anesthesia team.  A well padded nonsterile tourniquet was placed. The operative extremity was prepped and draped in standard sterile fashion.  A timeout was performed.  Preoperative antibiotics were given.   A palmar incision was made about 5 mm ulnar to the thenar crease.  The palmar aponeurosis was exposed and divided in line with the skin incision. The palmaris brevis was visualized and divided.  The distal edge of the transcarpal ligament was identified. A hemostat was inserted into the carpal tunnel to protect the median nerve and the flexor tendons. Then, the transverse carpal ligament was released under direct visualization. Proximally, a subcutaneous tunnel was made allowing a Sewell retractor to be placed. Then, the distal portion of the antebrachial fascia was released. Distally, all fibrous bands were released. The median nerve was visualized, and the fat pad was exposed. Following release, local infiltration with 0.25% of Sensorcaine was given. The tourniquet was deflated. Hemostasis achieved.  Wound was irrigated and closed with 4-0 nylon sutures. Sterile dressing applied. The patient was transferred to the recovery room in stable  condition after all counts were correct.  POSTOPERATIVE PLAN: To start nerve gliding exercises as tolerated and no heavy lifting for four weeks.

## 2017-04-04 NOTE — Anesthesia Postprocedure Evaluation (Signed)
Anesthesia Post Note  Patient: Jesse Franklin  Procedure(s) Performed: Procedure(s) (LRB): RIGHT CARPAL TUNNEL RELEASE (Right)     Patient location during evaluation: PACU Anesthesia Type: General Level of consciousness: awake and alert and oriented Pain management: pain level controlled Vital Signs Assessment: post-procedure vital signs reviewed and stable Respiratory status: spontaneous breathing, nonlabored ventilation and respiratory function stable Cardiovascular status: blood pressure returned to baseline and stable Postop Assessment: no signs of nausea or vomiting Anesthetic complications: no    Last Vitals:  Vitals:   04/04/17 1045 04/04/17 1104  BP: 128/89 132/83  Pulse: (!) 58 (!) 56  Resp: 20 18  Temp:  (!) 36.4 C    Last Pain:  Vitals:   04/04/17 1104  TempSrc: Oral  PainSc: 0-No pain                 Taeko Schaffer A.

## 2017-04-04 NOTE — Anesthesia Preprocedure Evaluation (Addendum)
Anesthesia Evaluation  Patient identified by MRN, date of birth, ID band Patient awake    Reviewed: Allergy & Precautions, NPO status , Patient's Chart, lab work & pertinent test results  Airway Mallampati: III  TM Distance: >3 FB Neck ROM: Full    Dental  (+) Poor Dentition, Missing   Pulmonary Current Smoker,    Pulmonary exam normal breath sounds clear to auscultation       Cardiovascular hypertension, Pt. on medications + angina with exertion + CAD and + Past MI  Normal cardiovascular exam+ dysrhythmias Atrial Fibrillation  Rhythm:Regular Rate:Normal  12 lead EKG - A. Fib with slow ventricular response   Neuro/Psych PSYCHIATRIC DISORDERS Depression Schizophrenia Right CTS    GI/Hepatic GERD  Controlled and Medicated,(+)     substance abuse  cocaine use, Hx/o substance abuse   Endo/Other  Hyperlipidemia  Renal/GU negative Renal ROS   BPH ED    Musculoskeletal   Abdominal   Peds  Hematology  (+) anemia , Thrombocytopenia   Anesthesia Other Findings   Reproductive/Obstetrics                           Anesthesia Physical Anesthesia Plan  ASA: III  Anesthesia Plan: General   Post-op Pain Management:    Induction: Intravenous  PONV Risk Score and Plan: 1 and Ondansetron, Treatment may vary due to age or medical condition, Propofol and Midazolam  Airway Management Planned: LMA  Additional Equipment:   Intra-op Plan:   Post-operative Plan: Extubation in OR  Informed Consent: I have reviewed the patients History and Physical, chart, labs and discussed the procedure including the risks, benefits and alternatives for the proposed anesthesia with the patient or authorized representative who has indicated his/her understanding and acceptance.   Dental advisory given  Plan Discussed with: Anesthesiologist, CRNA and Surgeon  Anesthesia Plan Comments: (Patient refuses Bier block,  will proceed with GA.)       Anesthesia Quick Evaluation

## 2017-04-04 NOTE — Transfer of Care (Signed)
Immediate Anesthesia Transfer of Care Note  Patient: Jesse Franklin  Procedure(s) Performed: Procedure(s): RIGHT CARPAL TUNNEL RELEASE (Right)  Patient Location: PACU  Anesthesia Type:General  Level of Consciousness: sedated  Airway & Oxygen Therapy: Patient Spontanous Breathing and Patient connected to face mask oxygen  Post-op Assessment: Report given to RN and Post -op Vital signs reviewed and stable  Post vital signs: Reviewed and stable  Last Vitals:  Vitals:   04/04/17 0808 04/04/17 1011  BP: 119/82 (P) 110/78  Pulse: 88   Resp: 18   Temp: 36.5 C (!) 36.4 C    Last Pain:  Vitals:   04/04/17 0808  TempSrc: Oral         Complications: No apparent anesthesia complications

## 2017-04-05 NOTE — Addendum Note (Signed)
Addendum  created 04/05/17 1300 by Tawni Millers, CRNA   Charge Capture section accepted

## 2017-04-12 ENCOUNTER — Other Ambulatory Visit: Payer: Self-pay | Admitting: *Deleted

## 2017-04-12 MED ORDER — FERROUS SULFATE 325 (65 FE) MG PO TABS: ORAL_TABLET | ORAL | 0 refills | Status: DC

## 2017-04-16 ENCOUNTER — Ambulatory Visit (INDEPENDENT_AMBULATORY_CARE_PROVIDER_SITE_OTHER): Payer: Medicare Other

## 2017-04-16 DIAGNOSIS — Z5181 Encounter for therapeutic drug level monitoring: Secondary | ICD-10-CM | POA: Diagnosis not present

## 2017-04-16 DIAGNOSIS — I4891 Unspecified atrial fibrillation: Secondary | ICD-10-CM

## 2017-04-16 LAB — POCT INR: INR: 2.6

## 2017-04-30 ENCOUNTER — Ambulatory Visit (INDEPENDENT_AMBULATORY_CARE_PROVIDER_SITE_OTHER): Payer: Medicare Other | Admitting: Orthopaedic Surgery

## 2017-04-30 DIAGNOSIS — G5601 Carpal tunnel syndrome, right upper limb: Secondary | ICD-10-CM

## 2017-04-30 NOTE — Progress Notes (Signed)
Patient is almost 4 weeks status post right carpal tunnel release. He is doing well. His incision is healed. No signs of infection. He has good use of the hand. The sutures were removed today. Neosporin ointment to the incision. Follow-up in 4 weeks for recheck.

## 2017-05-07 DIAGNOSIS — Z79899 Other long term (current) drug therapy: Secondary | ICD-10-CM | POA: Diagnosis not present

## 2017-05-07 DIAGNOSIS — M1A09X1 Idiopathic chronic gout, multiple sites, with tophus (tophi): Secondary | ICD-10-CM | POA: Diagnosis not present

## 2017-05-07 DIAGNOSIS — M2550 Pain in unspecified joint: Secondary | ICD-10-CM | POA: Diagnosis not present

## 2017-05-28 ENCOUNTER — Ambulatory Visit (INDEPENDENT_AMBULATORY_CARE_PROVIDER_SITE_OTHER): Payer: Medicare Other | Admitting: Orthopedic Surgery

## 2017-05-28 ENCOUNTER — Ambulatory Visit (INDEPENDENT_AMBULATORY_CARE_PROVIDER_SITE_OTHER): Payer: Medicare Other | Admitting: Pharmacist Clinician (PhC)/ Clinical Pharmacy Specialist

## 2017-05-28 ENCOUNTER — Encounter (INDEPENDENT_AMBULATORY_CARE_PROVIDER_SITE_OTHER): Payer: Self-pay | Admitting: Orthopedic Surgery

## 2017-05-28 DIAGNOSIS — I4891 Unspecified atrial fibrillation: Secondary | ICD-10-CM | POA: Diagnosis not present

## 2017-05-28 DIAGNOSIS — Z5181 Encounter for therapeutic drug level monitoring: Secondary | ICD-10-CM | POA: Diagnosis not present

## 2017-05-28 DIAGNOSIS — G5601 Carpal tunnel syndrome, right upper limb: Secondary | ICD-10-CM

## 2017-05-28 LAB — POCT INR: INR: 3

## 2017-05-28 NOTE — Progress Notes (Signed)
Office Visit Note   Patient: Jesse Franklin           Date of Birth: [birth year]           MRN: [id number] Visit Date: 05/28/2017              Requested by: Leeanne Rio, [address], [place] [address] PCP: Leeanne Rio, MD  Chief Complaint  Patient presents with  . Right Hand - Follow-up      HPI: Patient is a 66 year old gentleman who presents status post right carpal tunnel release. States he occasionally has some tingling sensation into his fingers he is using triple antibiotic ointment for scar massage.  Assessment & Plan: Visit Diagnoses:  1. Right carpal tunnel syndrome     Plan: Continue scar massage continue increase his activities as tolerated.  Follow-Up Instructions: Return in about 3 weeks (around 06/18/2017).   Ortho Exam  Patient is alert, oriented, no adenopathy, well-dressed, normal affect, normal respiratory effort. Examination the incision is healing nicely there is no keloiding of the scar he has full range of motion of his fingers.  Imaging: No results found. No images are attached to the encounter.  Labs: Lab Results  Component Value Date   ESRSEDRATE 138 (H) 11/03/2014   LABURIC 4.4 08/02/2016   LABURIC 4.9 11/03/2014   LABURIC 4.3 10/27/2014   REPTSTATUS 11/11/2014 FINAL 11/05/2014   GRAMSTAIN Few 08/05/2014   GRAMSTAIN WBC present-both PMN and Mononuclear 08/05/2014   GRAMSTAIN No Organisms Seen 08/05/2014   CULT  11/05/2014    NO GROWTH 7 DAYS Performed at [facility] 3 DAYS 08/05/2014    Orders:  No orders of the defined types were placed in this encounter.  No orders of the defined types were placed in this encounter.    Procedures: No procedures performed  Clinical Data: No additional findings.  ROS:  All other systems negative, except as noted in the HPI. Review of Systems  Objective: Vital Signs: There were no vitals taken for this  visit.  Specialty Comments:  No specialty comments available.  PMFS History: Patient Active Problem List   Diagnosis Date Noted  . Numbness and tingling in right hand 01/30/2017  . Proteinuria 11/19/2015  . Stage I pressure ulcer of sacral region 11/12/2014  . Elevated liver enzymes 11/12/2014  . Acute gouty arthritis   . Malnutrition of moderate degree ([place]) 11/05/2014  . 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 Bilateral knee pain 11/03/2014  . Abdominal pain, lower   . Supratherapeutic INR   . Baker's cyst of knee 10/06/2014  . Anginal chest pain at rest [facility]) 07/31/2014  . Nausea with vomiting   . Pulmonary hypertension ([place]) 12/02/2013  . Encounter for therapeutic drug monitoring 11/20/2013  . Hx of long term use of blood thinners 01/11/2013  . Influenza vaccination declined 05/24/2012  . Olecranon bursitis of left elbow 03/18/2012  . Erectile dysfunction 08/25/2011  . Knee pain, bilateral 05/04/2011  . Herpes genitalia 04/13/2011  . Anemia, iron deficiency 05/31/2010  . Gout 04/14/2009  . HYPERTENSION, BENIGN ESSENTIAL 12/11/2006  . HYPERCHOLESTEROLEMIA 11/08/2006  . SCHIZOPHRENIA 11/08/2006  . DEPRESSION, MAJOR, RECURRENT 11/08/2006  . TOBACCO DEPENDENCE 11/08/2006  . CAD (coronary artery disease) 11/08/2006  . Atrial fibrillation ([place]) 11/08/2006  . GASTROESOPHAGEAL REFLUX, NO ESOPHAGITIS 11/08/2006  . BPH 11/08/2006   Past Medical History:  Diagnosis Date  . Alcohol abuse   . Anemia   .  Atrial fibrillation ([place])   . BPH 11/08/2006  . CAD (coronary artery disease)    [place]  . Cocaine abuse    Hx of  . GERD (gastroesophageal reflux disease)   . Gout   . Herpes   . Herpes genitalia 04/13/2011  . Hyperlipidemia   . Hypertension   . Myocardial infarction ([place]) 2005  . RHINITIS, ALLERGIC 11/08/2006  . Tobacco abuse     Family History  Problem Relation Age of Onset  . Alcohol abuse Father   . Heart disease Brother   . Alcohol abuse Brother   .  Cancer Sister   . Hypertension Mother   . Gout Mother   . Arthritis Brother   . Hypertension Sister     Past Surgical History:  Procedure Laterality Date  . CARDIAC CATHETERIZATION  08/2004  . CARPAL TUNNEL RELEASE Right 04/04/2017   Procedure: RIGHT CARPAL TUNNEL RELEASE;  Surgeon: Leandrew Koyanagi, MD;  Location: [facility];  Service: Orthopedics;  Laterality: Right;  . LACERATION REPAIR Right 03/1988   "hand"   Social History   Occupational History  . retired- construction/truck drive Unemployed   Social History Main Topics  . Smoking status: Current Every Day Smoker    Packs/day: 0.25    Years: 48.00    Types: Cigarettes  . Smokeless tobacco: Former Systems developer    Types: [place] date: 09/12/1967     Comment: smoking 3-4 cigs/day  . Alcohol use No     Comment: Pt states that he quit since last gout flare up  . Drug use: Yes    Types: "Crack" cocaine, Marijuana     Comment: past history of cocaine use. Patient admits to marijuana use.   Marland Kitchen Sexual activity: Not Currently

## 2017-05-29 ENCOUNTER — Other Ambulatory Visit: Payer: Self-pay | Admitting: *Deleted

## 2017-05-30 ENCOUNTER — Other Ambulatory Visit: Payer: Self-pay | Admitting: Family Medicine

## 2017-06-01 MED ORDER — FERROUS SULFATE 325 (65 FE) MG PO TABS: ORAL_TABLET | ORAL | 0 refills | Status: DC

## 2017-06-18 ENCOUNTER — Ambulatory Visit (INDEPENDENT_AMBULATORY_CARE_PROVIDER_SITE_OTHER): Payer: Medicare Other | Admitting: Orthopaedic Surgery

## 2017-06-18 DIAGNOSIS — G5601 Carpal tunnel syndrome, right upper limb: Secondary | ICD-10-CM

## 2017-06-18 NOTE — Progress Notes (Signed)
Patient is 75 day status post right carpal tunnel release. Overall he is doing well. His symptoms of numbness and tingling have significantly improved. He denies any pain. He still complains mainly just some weakness in holding jugs of water.  Physical exam shows a fully healed surgical scar. He has full use of the hand. Adequate grip strength.  Patient has done well from the surgery. I would expect him to continue to improve in terms of his grip strength. Continue with home exercises. Follow-up as needed.

## 2017-07-09 ENCOUNTER — Ambulatory Visit (INDEPENDENT_AMBULATORY_CARE_PROVIDER_SITE_OTHER): Payer: Medicare Other | Admitting: *Deleted

## 2017-07-09 DIAGNOSIS — Z5181 Encounter for therapeutic drug level monitoring: Secondary | ICD-10-CM | POA: Diagnosis not present

## 2017-07-09 DIAGNOSIS — I4891 Unspecified atrial fibrillation: Secondary | ICD-10-CM | POA: Diagnosis not present

## 2017-07-09 LAB — POCT INR: INR: 4.1

## 2017-07-13 ENCOUNTER — Other Ambulatory Visit: Payer: Self-pay | Admitting: Family Medicine

## 2017-07-15 ENCOUNTER — Other Ambulatory Visit: Payer: Self-pay | Admitting: Family Medicine

## 2017-07-16 NOTE — Telephone Encounter (Signed)
Patient left message on nurse line checking status of refills.

## 2017-07-19 ENCOUNTER — Ambulatory Visit (INDEPENDENT_AMBULATORY_CARE_PROVIDER_SITE_OTHER): Payer: Medicare Other | Admitting: Pharmacist

## 2017-07-19 DIAGNOSIS — Z5181 Encounter for therapeutic drug level monitoring: Secondary | ICD-10-CM

## 2017-07-19 DIAGNOSIS — I4891 Unspecified atrial fibrillation: Secondary | ICD-10-CM

## 2017-07-19 LAB — POCT INR: INR: 3

## 2017-07-27 ENCOUNTER — Ambulatory Visit (INDEPENDENT_AMBULATORY_CARE_PROVIDER_SITE_OTHER): Payer: Medicare Other | Admitting: Cardiovascular Disease

## 2017-07-27 ENCOUNTER — Encounter: Payer: Self-pay | Admitting: Cardiovascular Disease

## 2017-07-27 VITALS — BP 120/80 | HR 72 | Ht 66.5 in | Wt 186.0 lb

## 2017-07-27 DIAGNOSIS — I4891 Unspecified atrial fibrillation: Secondary | ICD-10-CM

## 2017-07-27 DIAGNOSIS — I25119 Atherosclerotic heart disease of native coronary artery with unspecified angina pectoris: Secondary | ICD-10-CM

## 2017-07-27 MED ORDER — SILDENAFIL CITRATE 20 MG PO TABS: 100.0000 mg | ORAL_TABLET | ORAL | 3 refills | Status: DC | PRN

## 2017-07-27 MED ORDER — SILDENAFIL CITRATE 20 MG PO TABS: 20.0000 mg | ORAL_TABLET | ORAL | 0 refills | Status: DC | PRN

## 2017-07-27 NOTE — Patient Instructions (Signed)
Medication Instructions:  Your physician recommends that you continue on your current medications as directed. Please refer to the Current Medication list given to you today.  1. Patient was given a prescription for Sildenafil 5 tablets (100 mg) as needed for Erectile Dysfunction.  Labwork: Your physician recommends that you have lab work today: bmet/lft/lipid   Testing/Procedures: -None  Follow-Up: Your physician wants you to follow-up in: 6 month with Dr. Acie Fredrickson.  You will receive a reminder letter in the mail two months in advance. If you don't receive a letter, please call our office to schedule the follow-up appointment.   Any Other Special Instructions Will Be Listed Below (If Applicable).     If you need a refill on your cardiac medications before your next appointment, please call your pharmacy.

## 2017-07-27 NOTE — Progress Notes (Signed)
Cardiology Office Note   Date:  07/27/2017   ID:  JLEN WINTLE, DOB 1951/02/21, MRN [id number]  PCP:  Leeanne Rio, MD  Cardiologist:   Mertie Moores, MD   Chief Complaint  Patient presents with  . Coronary Artery Disease   1. Atrial fibrillation -  2. Coronary artery disease RESULTS: 08/30/04 1. Left main: Normal.  2. LAD: Large vessel, tortuous in the mid and distal section with mild  luminal irregularities. The extreme distal portion of the LAD after the  trifurcation of the distal vessel has a subtotal occlusion at the  trifurcation tip. Vessel less than 0.5 mm at this juncture.  3. LXC: Codominant with mild luminal irregularities.  4. OM1 and OM2: Large vessels with mild luminal irregularities.  5. RCA: Tortuous, codominant with mild luminal irregularities, very faint  right to left filling collaterals seen at the apex.  6. LV: EF 50-55% with mild inferior apical hypokinesis. LVEDP was 35  mmHg.  7. Abdominal/distal aortogram showed mild tortuosity. No significant  aneurysms were seen. The distal aorta showed mild splaying to the left  at the area of the iliac bifurcation. No significant iliac or femoral  disease was noted.  3. Hypertension 4. 6. Moderate pulmonary hypertension by echo in 2010 ( ongoing cigarette smoking)  5. Hyperlipidemia   Derrian is a 66 yo with hx as noted above. He has occasional episodes of left arm tingling. These episodes last 1-2 seconds. They're not necessarily associated with exertion. The tingling seems to get better with arm movement. He also has occasional episodes of shortness of breath.  He walks on a regular basis. He walks approximately 1 mile a day. He still eats some salt and also eats fried and fast foods.  December 02, 2013:  Josephine is doing well. He denies any chest pain or shortness of breath. He walks about 15-20 minutes each day. He no longer works. He has cut down on his salt usage.    [name] 5,  2016:   SLAYDE BRAULT is a 66 y.o. male who presents for follow-up of his atrial fibrillation. He also has a history of hypertension  Still smoking  -   Nov. 1, 2016:  Doing well from a cardiac standpoint.  Having gout issues  in left elbow and hand.  No Cp or dyspnea.  Still smoking   January 03, 2016:    Nashaun is seen today  No CP , some back pain  Still smoking    December 25, 2016  occasional cp .  No changes in his angina pattern Still smoking   July 27, 2017:  Staying active , no further CP or dyspnea .  Sells loose cigaretes.  $0.50 per cigarette    Past Medical History:  Diagnosis Date  . Alcohol abuse   . Anemia   . Atrial fibrillation ([place])   . BPH 11/08/2006  . CAD (coronary artery disease)    [place]  . Cocaine abuse ([facility])    Hx of  . GERD (gastroesophageal reflux disease)   . Gout   . Herpes   . Herpes genitalia 04/13/2011  . Hyperlipidemia   . Hypertension   . Myocardial infarction ([place]) 2005  . RHINITIS, ALLERGIC 11/08/2006  . Tobacco abuse     Past Surgical History:  Procedure Laterality Date  . CARDIAC CATHETERIZATION  08/2004  . LACERATION REPAIR Right 03/1988   "hand"  . RIGHT CARPAL TUNNEL RELEASE Right 04/04/2017   Performed by Erlinda Hong,  Marylynn Pearson, MD at [facility]     Current Outpatient Medications  Medication Sig Dispense Refill  . allopurinol (ZYLOPRIM) 100 MG tablet Take 100 mg by mouth 2 (two) times daily.     Marland Kitchen amLODipine (NORVASC) 10 MG tablet TAKE 1 TABLET (10 MG TOTAL) BY MOUTH DAILY. 90 tablet 1  . clopidogrel (PLAVIX) 75 MG tablet Take 1 tablet (75 mg total) by mouth daily. 90 tablet 3  . colchicine 0.6 MG tablet TAKE 1 TABLET BY MOUTH TWICE A DAY 180 tablet 0  . ferrous sulfate 325 (65 FE) MG tablet TAKE 1 TABLET BY MOUTH EVERY DAY WITH BREAKFAST 90 tablet 0  . HYDROcodone-acetaminophen (NORCO) 5-325 MG tablet Take 1-2 tablets by mouth 2 (two) times daily as needed. 40 tablet 0  . nitroGLYCERIN (NITROSTAT)  0.4 MG SL tablet Place 1 tablet (0.4 mg total) under the tongue as directed. Place 1 tab under tongue as directed 25 tablet 6  . pantoprazole (PROTONIX) 40 MG tablet TAKE 1 TABLET (40 MG TOTAL) BY MOUTH DAILY. 90 tablet 1  . rosuvastatin (CRESTOR) 40 MG tablet TAKE 1 TABLET BY MOUTH EVERY DAY 90 tablet 3  . warfarin (COUMADIN) 5 MG tablet TAKE AS DIRECTED BY COUMADIN CLINIC 120 tablet 1   No current facility-administered medications for this visit.    Facility-Administered Medications Ordered in Other Visits  Medication Dose Route Frequency Provider Last Rate Last Dose  . 0.9 %  sodium chloride infusion   Intravenous Continuous Willeen Niece, MD 125 mL/hr at 07/31/14 1200      Allergies:   Codeine and Ketorolac tromethamine    Social History:  The patient  reports that he has been smoking cigarettes.  He has a 12.00 pack-year smoking history. He quit smokeless tobacco use about 49 years ago. His smokeless tobacco use included chew. He reports that he uses drugs. Drugs: "Crack" cocaine and Marijuana. He reports that he does not drink alcohol.   Family History:  The patient's family history includes Alcohol abuse in his brother and father; Arthritis in his brother; Cancer in his sister; Gout in his mother; Heart disease in his brother; Hypertension in his mother and sister.    ROS:  Please see the history of present illness.   Reviewed in the current history.  All other systems are negative.  Physical Exam: Blood pressure 120/80, pulse 72, height 5' 6.5" (1.689 m), weight 186 lb (84.4 kg), SpO2 98 %.  GEN:  Well nourished, well developed in no acute distress HEENT: Normal NECK: No JVD; No carotid bruits LYMPHATICS: No lymphadenopathy CARDIAC: Irreg. Irreg. , soft murmur , rubs, gallops RESPIRATORY:  Clear to auscultation without rales, wheezing or rhonchi  ABDOMEN: Soft, non-tender, non-distended MUSCULOSKELETAL:  No edema;   + clubbing of fingers.  SKIN: Warm and dry NEUROLOGIC:   Alert and oriented x 3   EKG:    Recent Labs: 08/02/2016: Hemoglobin 12.9; Platelets 167 12/25/2016: ALT 34 04/03/2017: BUN 10; Creatinine, Ser 1.12; Potassium 3.6; Sodium 142    Lipid Panel    Component Value Date/Time   CHOL 141 12/25/2016 0921   TRIG 139 12/25/2016 0921   HDL 38 (L) 12/25/2016 0921   CHOLHDL 3.7 12/25/2016 0921   CHOLHDL 3.4 08/02/2016 0913   VLDL 26 08/02/2016 0913   LDLCALC 75 12/25/2016 0921   LDLDIRECT 95 05/28/2012 0925      Wt Readings from Last 3 Encounters:  07/27/17 186 lb (84.4 kg)  04/04/17 174 lb (78.9  kg)  02/13/17 178 lb 12.8 oz (81.1 kg)      Other studies Reviewed: Additional studies/ records that were reviewed today include: . Review of the above records demonstrates:    ASSESSMENT AND PLAN:  1. Atrial fibrillation-rate is well controlled.  Continue Coumadin.  He seems to be doing fairly well.  2. Coronary artery disease Cath  08/30/04 Has mild coronary artery disease.  He has an occlusion of the distal aspect of the LAD. He still smokes a few cigarettes a day.  I have advised him to stop.  .  3. Hypertension -blood pressure is well controlled.   4.  Moderate pulmonary hypertension by echo in 2010 ( ongoing cigarette smoking) -encouraged him to stop smoking  Has clubbing of his fingers.   5. Hyperlipidemia - we'll check labs today.   Current medicines are reviewed at length with the patient today.  The patient does not have concerns regarding medicines.  The following changes have been made:  no change  Labs/ tests ordered today include:  No orders of the defined types were placed in this encounter.   Disposition:   FU with me in 6 months     Mertie Moores, MD  07/27/2017 11:37 AM    [facility] [address], [place], [place]  [address] Phone: [phone]; Fax: [phone]

## 2017-07-28 LAB — BASIC METABOLIC PANEL
BUN/Creatinine Ratio: 9 — ABNORMAL LOW (ref 10–24)
BUN: 12 mg/dL (ref 8–27)
CALCIUM: 9.9 mg/dL (ref 8.6–10.2)
CO2: 21 mmol/L (ref 20–29)
CREATININE: 1.39 mg/dL — AB (ref 0.76–1.27)
Chloride: 109 mmol/L — ABNORMAL HIGH (ref 96–106)
GFR, EST AFRICAN AMERICAN: 61 mL/min/{1.73_m2} (ref >59)
GFR, EST NON AFRICAN AMERICAN: 53 mL/min/{1.73_m2} — AB (ref >59)
Glucose: 91 mg/dL (ref 65–99)
POTASSIUM: 3.7 mmol/L (ref 3.5–5.2)
Sodium: 146 mmol/L — ABNORMAL HIGH (ref 134–144)

## 2017-07-28 LAB — HEPATIC FUNCTION PANEL
ALBUMIN: 4.5 g/dL (ref 3.6–4.8)
ALK PHOS: 92 IU/L (ref 39–117)
ALT: 45 IU/L — AB (ref 0–44)
AST: 47 IU/L — AB (ref 0–40)
BILIRUBIN TOTAL: 0.5 mg/dL (ref 0.0–1.2)
Bilirubin, Direct: 0.15 mg/dL (ref 0.00–0.40)
Total Protein: 7.6 g/dL (ref 6.0–8.5)

## 2017-07-28 LAB — LIPID PANEL
CHOL/HDL RATIO: 3.7 ratio (ref 0.0–5.0)
Cholesterol, Total: 141 mg/dL (ref 100–199)
HDL: 38 mg/dL — AB (ref >39)
LDL Calculated: 72 mg/dL (ref 0–99)
Triglycerides: 156 mg/dL — ABNORMAL HIGH (ref 0–149)
VLDL Cholesterol Cal: 31 mg/dL (ref 5–40)

## 2017-07-30 ENCOUNTER — Telehealth: Payer: Self-pay | Admitting: Cardiovascular Disease

## 2017-07-30 NOTE — Telephone Encounter (Signed)
Reviewed lab results with patient who verbalized understanding and is aware that results were forwarded to PCP. He thanked me for the call.

## 2017-07-30 NOTE — Telephone Encounter (Signed)
Left message for patient to call back  

## 2017-07-30 NOTE — Telephone Encounter (Signed)
Patient returning call for results 

## 2017-08-01 ENCOUNTER — Ambulatory Visit (INDEPENDENT_AMBULATORY_CARE_PROVIDER_SITE_OTHER): Payer: Medicare Other | Admitting: *Deleted

## 2017-08-01 DIAGNOSIS — Z5181 Encounter for therapeutic drug level monitoring: Secondary | ICD-10-CM | POA: Diagnosis not present

## 2017-08-01 DIAGNOSIS — I4891 Unspecified atrial fibrillation: Secondary | ICD-10-CM | POA: Diagnosis not present

## 2017-08-01 LAB — POCT INR: INR: 2.8

## 2017-08-01 NOTE — Patient Instructions (Signed)
Continue taking same dose of 59m daily except 7.57mon Mondays, Wednesdays, and Fridays.  Recheck INR in 3 weeks. Call if placed on any new medications [phone]

## 2017-08-23 ENCOUNTER — Other Ambulatory Visit: Payer: Self-pay | Admitting: Family Medicine

## 2017-08-23 ENCOUNTER — Ambulatory Visit (INDEPENDENT_AMBULATORY_CARE_PROVIDER_SITE_OTHER): Payer: Medicare Other

## 2017-08-23 DIAGNOSIS — Z5181 Encounter for therapeutic drug level monitoring: Secondary | ICD-10-CM | POA: Diagnosis not present

## 2017-08-23 DIAGNOSIS — I4891 Unspecified atrial fibrillation: Secondary | ICD-10-CM | POA: Diagnosis not present

## 2017-08-23 LAB — POCT INR: INR: 3.3

## 2017-08-23 NOTE — Patient Instructions (Signed)
Skip tomorrow's dosage of Coumadin, then resume same dosage 64m daily except 7.579mon Mondays, Wednesdays, and Fridays.  Recheck INR in 3 weeks. Call if placed on any new medications [phone]

## 2017-08-31 ENCOUNTER — Other Ambulatory Visit (HOSPITAL_COMMUNITY)
Admission: RE | Admit: 2017-08-31 | Discharge: 2017-08-31 | Disposition: A | Discharge status: Home / Self Care | Payer: Medicare Other | Source: Ambulatory Visit | Attending: Family Medicine | Admitting: Family Medicine

## 2017-08-31 ENCOUNTER — Other Ambulatory Visit: Payer: Self-pay | Admitting: Family Medicine

## 2017-08-31 ENCOUNTER — Encounter: Payer: Self-pay | Admitting: Family Medicine

## 2017-08-31 ENCOUNTER — Ambulatory Visit (INDEPENDENT_AMBULATORY_CARE_PROVIDER_SITE_OTHER): Payer: Medicare Other | Admitting: Family Medicine

## 2017-08-31 ENCOUNTER — Other Ambulatory Visit: Payer: Self-pay

## 2017-08-31 VITALS — BP 126/82 | HR 51 | Temp 98.4°F | Ht 66.5 in | Wt 178.2 lb

## 2017-08-31 DIAGNOSIS — R7989 Other specified abnormal findings of blood chemistry: Secondary | ICD-10-CM | POA: Diagnosis not present

## 2017-08-31 DIAGNOSIS — Z0000 Encounter for general adult medical examination without abnormal findings: Secondary | ICD-10-CM | POA: Diagnosis not present

## 2017-08-31 DIAGNOSIS — D509 Iron deficiency anemia, unspecified: Secondary | ICD-10-CM | POA: Diagnosis not present

## 2017-08-31 DIAGNOSIS — Z113 Encounter for screening for infections with a predominantly sexual mode of transmission: Secondary | ICD-10-CM | POA: Diagnosis present

## 2017-08-31 DIAGNOSIS — R739 Hyperglycemia, unspecified: Secondary | ICD-10-CM | POA: Diagnosis not present

## 2017-08-31 DIAGNOSIS — D649 Anemia, unspecified: Secondary | ICD-10-CM | POA: Diagnosis not present

## 2017-08-31 LAB — POCT GLYCOSYLATED HEMOGLOBIN (HGB A1C): HEMOGLOBIN A1C: 5.6

## 2017-08-31 NOTE — Patient Instructions (Signed)
It was great to see you again today!  Checking labs Will call or send letter with results  Refilled your iron  Fill out the healthcare power of attorney papers  Schedule an annual wellness visit with our nurse Lauren. This is a longer visit to focus on your wellness goals and how to keep you healthy. It is free.   Follow up with me in 6 months, sooner if needed.  Be well, Dr. Ardelia Mems     Health Maintenance, Male A healthy lifestyle and preventive care is important for your health and wellness. Ask your health care provider about what schedule of regular examinations is right for you. What should I know about weight and diet? Eat a Healthy Diet  Eat plenty of vegetables, fruits, whole grains, low-fat dairy products, and lean protein.  Do not eat a lot of foods high in solid fats, added sugars, or salt.  Maintain a Healthy Weight Regular exercise can help you achieve or maintain a healthy weight. You should:  Do at least 150 minutes of exercise each week. The exercise should increase your heart rate and make you sweat (moderate-intensity exercise).  Do strength-training exercises at least twice a week.  Watch Your Levels of Cholesterol and Blood Lipids  Have your blood tested for lipids and cholesterol every 5 years starting at 64 years of age. If you are at high risk for heart disease, you should start having your blood tested when you are 64 years old. You may need to have your cholesterol levels checked more often if: ? Your lipid or cholesterol levels are high. ? You are older than 64 years of age. ? You are at high risk for heart disease.  What should I know about cancer screening? Many types of cancers can be detected early and may often be prevented. Lung Cancer  You should be screened every year for lung cancer if: ? You are a current smoker who has smoked for at least 30 years. ? You are a former smoker who has quit within the past 15 years.  Talk to your  health care provider about your screening options, when you should start screening, and how often you should be screened.  Colorectal Cancer  Routine colorectal cancer screening usually begins at 64 years of age and should be repeated every 5-10 years until you are 64 years old. You may need to be screened more often if early forms of precancerous polyps or small growths are found. Your health care provider may recommend screening at an earlier age if you have risk factors for colon cancer.  Your health care provider may recommend using home test kits to check for hidden blood in the stool.  A small camera at the end of a tube can be used to examine your colon (sigmoidoscopy or colonoscopy). This checks for the earliest forms of colorectal cancer.  Prostate and Testicular Cancer  Depending on your age and overall health, your health care provider may do certain tests to screen for prostate and testicular cancer.  Talk to your health care provider about any symptoms or concerns you have about testicular or prostate cancer.  Skin Cancer  Check your skin from head to toe regularly.  Tell your health care provider about any new moles or changes in moles, especially if: ? There is a change in a mole's size, shape, or color. ? You have a mole that is larger than a pencil eraser.  Always use sunscreen. Apply sunscreen liberally and repeat  throughout the day.  Protect yourself by wearing long sleeves, pants, a wide-brimmed hat, and sunglasses when outside.  What should I know about heart disease, diabetes, and high blood pressure?  If you are 66-24 years of age, have your blood pressure checked every 3-5 years. If you are 85 years of age or older, have your blood pressure checked every year. You should have your blood pressure measured twice-once when you are at a hospital or clinic, and once when you are not at a hospital or clinic. Record the average of the two measurements. To check your  blood pressure when you are not at a hospital or clinic, you can use: ? An automated blood pressure machine at a pharmacy. ? A home blood pressure monitor.  Talk to your health care provider about your target blood pressure.  If you are between 17-1 years old, ask your health care provider if you should take aspirin to prevent heart disease.  Have regular diabetes screenings by checking your fasting blood sugar level. ? If you are at a normal weight and have a low risk for diabetes, have this test once every three years after the age of 35. ? If you are overweight and have a high risk for diabetes, consider being tested at a younger age or more often.  A one-time screening for abdominal aortic aneurysm (AAA) by ultrasound is recommended for men aged 89-75 years who are current or former smokers. What should I know about preventing infection? Hepatitis B If you have a higher risk for hepatitis B, you should be screened for this virus. Talk with your health care provider to find out if you are at risk for hepatitis B infection. Hepatitis C Blood testing is recommended for:  Everyone born from [birth year] through 1963.  Anyone with known risk factors for hepatitis C.  Sexually Transmitted Diseases (STDs)  You should be screened each year for STDs including gonorrhea and chlamydia if: ? You are sexually active and are younger than 64 years of age. ? You are older than 64 years of age and your health care provider tells you that you are at risk for this type of infection. ? Your sexual activity has changed since you were last screened and you are at an increased risk for chlamydia or gonorrhea. Ask your health care provider if you are at risk.  Talk with your health care provider about whether you are at high risk of being infected with HIV. Your health care provider may recommend a prescription medicine to help prevent HIV infection.  What else can I do?  Schedule regular health, dental, and  eye exams.  Stay current with your vaccines (immunizations).  Do not use any tobacco products, such as cigarettes, chewing tobacco, and e-cigarettes. If you need help quitting, ask your health care provider.  Limit alcohol intake to no more than 2 drinks per day. One drink equals 12 ounces of beer, 5 ounces of wine, or 1 ounces of hard liquor.  Do not use street drugs.  Do not share needles.  Ask your health care provider for help if you need support or information about quitting drugs.  Tell your health care provider if you often feel depressed.  Tell your health care provider if you have ever been abused or do not feel safe at home. This information is not intended to replace advice given to you by your health care provider. Make sure you discuss any questions you have with your health care provider.  Document Released: 02/24/2008 Document Revised: 04/26/2016 Document Reviewed: 06/01/2015 Elsevier Interactive Patient Education  Raffi Schein.

## 2017-08-31 NOTE — Progress Notes (Addendum)
Date of Visit: 08/31/2017   HPI:  Patient presents today for a well adult male exam.   Concerns today: none Sexual activity: + sexually active in the last year. Vaginal and receptive oral sex. Male partner only. Does not use condoms. STD Screening: desires today. Diet: working on eating healthy Smoking: yes, smokes cigarettes Drugs: smokes marijuana daily Advance directives: desires to be full code. His surrogate decision maker would be his sister, Reola Calkins. He would be okay with short-term life support measures, but would not want to be on life support beyond a few weeks. Does not have HCPOA or advance directive formally in place.  Anemia - needs refill on iron. Due for labs  ROS: See HPI  [place]:  Patient has [place] of hyperlipidemia, tobacco abuse, hypertension, afib, CAD, depression  PHYSICAL EXAM: BP 126/82   Pulse (!) 51   Temp 98.4 F (36.9 C) (Oral)   Ht 5' 6.5" (1.689 m)   Wt 178 lb 3.2 oz (80.8 kg)   SpO2 98%   BMI 28.33 kg/m  Gen: NAD, pleasant, cooperative HEENT: NCAT, PERRL, no palpable thyromegaly or anterior cervical lymphadenopathy Heart: RRR, no murmurs Lungs: CTAB, NWOB Abdomen: soft, nontender to palpation Neuro: grossly nonfocal, speech normal  ASSESSMENT/PLAN:  # Health maintenance:  -STD screening: HIV, RPR, urine gc/chlamydia/trich obtained today -immunizations UTD -lipid screening: UTD -colonoscopy: UTD -advance directives: given advance directives packet -handout given on health maintenance topics -encouraged him to schedule an Annual Wellness Visit   Anemia, iron deficiency Check iron studies & CBC today Refill iron Current on colon cancer screening   Check BMET to follow renal function, also A1c given prior hyperglycemia  Encouraged smoking cessation of both tobacco and marijuana  FOLLOW UP: Follow up in 6 months for routine medical problems  [place] J. Ardelia Mems, [place]

## 2017-09-01 LAB — CBC
HEMATOCRIT: 37.4 % — AB (ref 37.5–51.0)
Hemoglobin: 13 g/dL (ref 13.0–17.7)
MCH: 32.3 pg (ref 26.6–33.0)
MCHC: 34.8 g/dL (ref 31.5–35.7)
MCV: 93 fL (ref 79–97)
PLATELETS: 157 10*3/uL (ref 150–379)
RBC: 4.03 x10E6/uL — ABNORMAL LOW (ref 4.14–5.80)
RDW: 15.1 % (ref 12.3–15.4)
WBC: 3.5 10*3/uL (ref 3.4–10.8)

## 2017-09-01 LAB — RPR: RPR: NONREACTIVE

## 2017-09-01 LAB — HIV ANTIBODY (ROUTINE TESTING W REFLEX): HIV SCREEN 4TH GENERATION: NONREACTIVE

## 2017-09-01 LAB — BASIC METABOLIC PANEL
BUN / CREAT RATIO: 7 — AB (ref 10–24)
BUN: 8 mg/dL (ref 8–27)
CHLORIDE: 109 mmol/L — AB (ref 96–106)
CO2: 20 mmol/L (ref 20–29)
CREATININE: 1.19 mg/dL (ref 0.76–1.27)
Calcium: 9.8 mg/dL (ref 8.6–10.2)
GFR calc non Af Amer: 64 mL/min/{1.73_m2} (ref >59)
GFR, EST AFRICAN AMERICAN: 74 mL/min/{1.73_m2} (ref >59)
GLUCOSE: 80 mg/dL (ref 65–99)
Potassium: 3.9 mmol/L (ref 3.5–5.2)
SODIUM: 143 mmol/L (ref 134–144)

## 2017-09-01 LAB — FERRITIN: Ferritin: 321 ng/mL (ref 30–400)

## 2017-09-01 LAB — IRON AND TIBC
Iron Saturation: 31 % (ref 15–55)
Iron: 83 ug/dL (ref 38–169)
Total Iron Binding Capacity: 268 ug/dL (ref 250–450)
UIBC: 185 ug/dL (ref 111–343)

## 2017-09-03 LAB — URINE CYTOLOGY ANCILLARY ONLY
CHLAMYDIA, DNA PROBE: NEGATIVE
NEISSERIA GONORRHEA: NEGATIVE
TRICH (WINDOWPATH): NEGATIVE

## 2017-09-03 NOTE — Assessment & Plan Note (Signed)
Check iron studies & CBC today Refill iron Current on colon cancer screening

## 2017-09-11 ENCOUNTER — Encounter: Payer: Self-pay | Admitting: Family Medicine

## 2017-09-13 ENCOUNTER — Ambulatory Visit (INDEPENDENT_AMBULATORY_CARE_PROVIDER_SITE_OTHER): Payer: Medicare Other

## 2017-09-13 DIAGNOSIS — I4891 Unspecified atrial fibrillation: Secondary | ICD-10-CM

## 2017-09-13 DIAGNOSIS — Z5181 Encounter for therapeutic drug level monitoring: Secondary | ICD-10-CM

## 2017-09-13 LAB — POCT INR: INR: 3

## 2017-09-13 NOTE — Patient Instructions (Signed)
Description   Take 1 tablet tomorrow, then resume same dosage 55m daily except 7.518mon Mondays, Wednesdays, and Fridays.  Recheck INR in 4 weeks. Call if placed on any new medications [phone]

## 2017-10-11 ENCOUNTER — Ambulatory Visit (INDEPENDENT_AMBULATORY_CARE_PROVIDER_SITE_OTHER): Payer: Medicare Other

## 2017-10-11 DIAGNOSIS — I4891 Unspecified atrial fibrillation: Secondary | ICD-10-CM

## 2017-10-11 DIAGNOSIS — Z5181 Encounter for therapeutic drug level monitoring: Secondary | ICD-10-CM | POA: Diagnosis not present

## 2017-10-11 LAB — POCT INR: INR: 3.6

## 2017-10-11 NOTE — Patient Instructions (Signed)
Description   Skip tomorrow's dosage of Coumadin, then start taking 3m daily except 7.561mon Mondays and Fridays.  Recheck INR in 3 weeks. Call if placed on any new medications [phone]

## 2017-10-13 ENCOUNTER — Other Ambulatory Visit: Payer: Self-pay | Admitting: Cardiovascular Disease

## 2017-10-23 DIAGNOSIS — M1A09X1 Idiopathic chronic gout, multiple sites, with tophus (tophi): Secondary | ICD-10-CM | POA: Diagnosis not present

## 2017-10-23 DIAGNOSIS — Z79899 Other long term (current) drug therapy: Secondary | ICD-10-CM | POA: Diagnosis not present

## 2017-10-23 DIAGNOSIS — M2550 Pain in unspecified joint: Secondary | ICD-10-CM | POA: Diagnosis not present

## 2017-11-01 ENCOUNTER — Ambulatory Visit (INDEPENDENT_AMBULATORY_CARE_PROVIDER_SITE_OTHER): Payer: Medicare Other | Admitting: *Deleted

## 2017-11-01 DIAGNOSIS — I4891 Unspecified atrial fibrillation: Secondary | ICD-10-CM

## 2017-11-01 DIAGNOSIS — Z5181 Encounter for therapeutic drug level monitoring: Secondary | ICD-10-CM

## 2017-11-01 LAB — POCT INR: INR: 2.8

## 2017-11-01 NOTE — Patient Instructions (Signed)
Description   Continue taking the you have been taking 5 mg daily except 7.5 mg on Mondays, Wednesdays and Fridays.  Recheck INR in 4 weeks. Call if placed on any new medications [phone]

## 2017-11-26 ENCOUNTER — Other Ambulatory Visit: Payer: Self-pay

## 2017-11-26 NOTE — Telephone Encounter (Signed)
90 day supply requested. Danley Danker, RN [facility] RN)

## 2017-11-28 MED ORDER — FERROUS SULFATE 325 (65 FE) MG PO TABS: 325.0000 mg | ORAL_TABLET | Freq: Every day | ORAL | 0 refills | Status: DC

## 2017-11-29 ENCOUNTER — Ambulatory Visit (INDEPENDENT_AMBULATORY_CARE_PROVIDER_SITE_OTHER): Payer: Medicare Other | Admitting: *Deleted

## 2017-11-29 DIAGNOSIS — Z5181 Encounter for therapeutic drug level monitoring: Secondary | ICD-10-CM

## 2017-11-29 DIAGNOSIS — I4891 Unspecified atrial fibrillation: Secondary | ICD-10-CM

## 2017-11-29 LAB — POCT INR: INR: 3.8

## 2017-11-29 NOTE — Patient Instructions (Addendum)
Description   Hold tomorrow's dose (already taken today's dose) then continue taking Warfarin 5 mg daily except 7.5 mg on Mondays, Wednesdays and Fridays.  Recheck INR in 3 weeks. Call if placed on any new medications [phone]

## 2017-12-02 ENCOUNTER — Other Ambulatory Visit: Payer: Self-pay | Admitting: Family Medicine

## 2017-12-20 ENCOUNTER — Ambulatory Visit (INDEPENDENT_AMBULATORY_CARE_PROVIDER_SITE_OTHER): Payer: Medicare Other | Admitting: *Deleted

## 2017-12-20 DIAGNOSIS — I4891 Unspecified atrial fibrillation: Secondary | ICD-10-CM | POA: Diagnosis not present

## 2017-12-20 DIAGNOSIS — Z5181 Encounter for therapeutic drug level monitoring: Secondary | ICD-10-CM

## 2017-12-20 LAB — POCT INR: INR: 3

## 2017-12-20 NOTE — Patient Instructions (Signed)
Description   Continue taking Warfarin 5 mg daily except 7.5 mg on Mondays, Wednesdays and Fridays.  Recheck INR in 4 weeks. Call if placed on any new medications [phone]

## 2018-01-03 ENCOUNTER — Other Ambulatory Visit: Payer: Self-pay | Admitting: Family Medicine

## 2018-01-06 ENCOUNTER — Other Ambulatory Visit: Payer: Self-pay | Admitting: Family Medicine

## 2018-01-17 ENCOUNTER — Ambulatory Visit (INDEPENDENT_AMBULATORY_CARE_PROVIDER_SITE_OTHER): Payer: Medicare Other | Admitting: Pharmacist

## 2018-01-17 DIAGNOSIS — I4891 Unspecified atrial fibrillation: Secondary | ICD-10-CM

## 2018-01-17 DIAGNOSIS — Z5181 Encounter for therapeutic drug level monitoring: Secondary | ICD-10-CM | POA: Diagnosis not present

## 2018-01-17 LAB — POCT INR: INR: 3.5

## 2018-01-17 NOTE — Patient Instructions (Signed)
Description   No warfarin tomorrow (since already took 1 tablet today) then change dose to Warfarin 5 mg daily except 7.5 mg on Mondays and Fridays.  Recheck INR in 2 weeks. Call if placed on any new medications [phone]

## 2018-01-20 ENCOUNTER — Other Ambulatory Visit: Payer: Self-pay | Admitting: Cardiovascular Disease

## 2018-01-22 DIAGNOSIS — H5213 Myopia, bilateral: Secondary | ICD-10-CM | POA: Diagnosis not present

## 2018-01-31 ENCOUNTER — Ambulatory Visit (INDEPENDENT_AMBULATORY_CARE_PROVIDER_SITE_OTHER): Payer: Medicare Other | Admitting: *Deleted

## 2018-01-31 DIAGNOSIS — I4891 Unspecified atrial fibrillation: Secondary | ICD-10-CM

## 2018-01-31 DIAGNOSIS — Z5181 Encounter for therapeutic drug level monitoring: Secondary | ICD-10-CM | POA: Diagnosis not present

## 2018-01-31 LAB — POCT INR: INR: 2.8 (ref 2.0–3.0)

## 2018-01-31 NOTE — Patient Instructions (Signed)
Description   Start  taking 1 tablet everyday except 1.5 tablets on Mondays and Fridays.   Recheck INR in 2 weeks. Call if placed on any new medications [phone]

## 2018-02-14 ENCOUNTER — Ambulatory Visit (INDEPENDENT_AMBULATORY_CARE_PROVIDER_SITE_OTHER): Payer: Medicare Other

## 2018-02-14 DIAGNOSIS — I4891 Unspecified atrial fibrillation: Secondary | ICD-10-CM | POA: Diagnosis not present

## 2018-02-14 DIAGNOSIS — Z5181 Encounter for therapeutic drug level monitoring: Secondary | ICD-10-CM

## 2018-02-14 LAB — POCT INR: INR: 2.7 (ref 2.0–3.0)

## 2018-02-14 NOTE — Patient Instructions (Signed)
Description   Continue on same dosage 1 tablet everyday except 1.5 tablets on Mondays and Fridays.   Recheck INR in 4 weeks. Call if placed on any new medications [phone]

## 2018-02-24 ENCOUNTER — Other Ambulatory Visit: Payer: Self-pay | Admitting: Family Medicine

## 2018-03-11 ENCOUNTER — Ambulatory Visit (INDEPENDENT_AMBULATORY_CARE_PROVIDER_SITE_OTHER): Payer: Medicare Other | Admitting: *Deleted

## 2018-03-11 DIAGNOSIS — Z5181 Encounter for therapeutic drug level monitoring: Secondary | ICD-10-CM

## 2018-03-11 DIAGNOSIS — I4891 Unspecified atrial fibrillation: Secondary | ICD-10-CM | POA: Diagnosis not present

## 2018-03-11 LAB — POCT INR: INR: 3.1 — AB (ref 2.0–3.0)

## 2018-03-11 NOTE — Patient Instructions (Addendum)
Description   Today take 1 tablet then continue on same dosage 1 tablet everyday except 1.5 tablets on Mondays and Fridays.   Recheck INR in 4 weeks. Call if placed on any new medications [phone]

## 2018-03-12 ENCOUNTER — Encounter: Payer: Self-pay | Admitting: Family Medicine

## 2018-03-12 ENCOUNTER — Other Ambulatory Visit: Payer: Self-pay

## 2018-03-12 ENCOUNTER — Ambulatory Visit (INDEPENDENT_AMBULATORY_CARE_PROVIDER_SITE_OTHER): Payer: Medicare Other | Admitting: Family Medicine

## 2018-03-12 VITALS — BP 124/68 | HR 56 | Temp 98.1°F | Ht 66.5 in | Wt 183.4 lb

## 2018-03-12 DIAGNOSIS — I10 Essential (primary) hypertension: Secondary | ICD-10-CM

## 2018-03-12 DIAGNOSIS — D509 Iron deficiency anemia, unspecified: Secondary | ICD-10-CM

## 2018-03-12 DIAGNOSIS — K219 Gastro-esophageal reflux disease without esophagitis: Secondary | ICD-10-CM

## 2018-03-12 DIAGNOSIS — F17200 Nicotine dependence, unspecified, uncomplicated: Secondary | ICD-10-CM

## 2018-03-12 DIAGNOSIS — R351 Nocturia: Secondary | ICD-10-CM

## 2018-03-12 DIAGNOSIS — N401 Enlarged prostate with lower urinary tract symptoms: Secondary | ICD-10-CM

## 2018-03-12 DIAGNOSIS — R809 Proteinuria, unspecified: Secondary | ICD-10-CM | POA: Diagnosis not present

## 2018-03-12 LAB — POCT UA - MICROSCOPIC ONLY

## 2018-03-12 LAB — POCT URINALYSIS DIP (MANUAL ENTRY)
Glucose, UA: NEGATIVE mg/dL
Ketones, POC UA: NEGATIVE mg/dL
Leukocytes, UA: NEGATIVE
NITRITE UA: NEGATIVE
PH UA: 6 (ref 5.0–8.0)
SPEC GRAV UA: 1.015 (ref 1.010–1.025)
UROBILINOGEN UA: 0.2 U/dL

## 2018-03-12 MED ORDER — TAMSULOSIN HCL 0.4 MG PO CAPS: 0.4000 mg | ORAL_CAPSULE | Freq: Every day | ORAL | 3 refills | Status: DC

## 2018-03-12 MED ORDER — RANITIDINE HCL 150 MG PO TABS: 150.0000 mg | ORAL_TABLET | Freq: Two times a day (BID) | ORAL | 1 refills | Status: DC

## 2018-03-12 NOTE — Assessment & Plan Note (Signed)
UA prelim shows 100 of protein. Will add urine prot:creat ratio if quantity sufficient, otherwise needs to be done at next visit.

## 2018-03-12 NOTE — Assessment & Plan Note (Signed)
Encouraged smoking cessation again. Heavy marijuana user, smoking 3-4 tobacco cigs per day.

## 2018-03-12 NOTE — Assessment & Plan Note (Signed)
Suspect due to BPH (patient with history of this per problem list). No family history of prostate cancer. Check UA & PSA.  Rectal exam performed which was limited as I could not fully reach his prostate.  Offered to have someone with longer fingers check but patient declined. Will start flomax and await PSA. Follow up in 6 weeks to see how symptoms are doing. Would recommend rechecking prostate exam with a provider with longer fingers at that time.

## 2018-03-12 NOTE — Assessment & Plan Note (Signed)
Well-controlled.  Continue current regimen. 

## 2018-03-12 NOTE — Progress Notes (Signed)
Date of Visit: 03/12/2018   HPI:  Patient presents for routine follow up.  Nocturia - up multiple times at night to urinate. No dysuria, fever, or pain. Has gone on for a few months.  Hypertension - taking amlodipine 44m daily, tolerating well. No chest pain or shortness of breath. No swelling.  GERD - taking protonix 433mdaily. Still with reflux symptoms fairly often despite this medication. Agreeable to adding ranitidine today.  Smoking cigarettes 3-4 per day. Would like to quit but has not yet.  Marijuana - uses a couple times per day. Thinks it helps him not hear bells, whistling, people calling his name.  ROS: See HPI.  PMArispehistory of hyperlipidemia, tobacco abuse, marijuana abuse, hypertension, gout, afib, CAD, iron deficiency anemia  PHYSICAL EXAM: BP 124/68   Pulse (!) 56   Temp 98.1 F (36.7 C) (Oral)   Ht 5' 6.5" (1.689 m)   Wt 183 lb 6.4 oz (83.2 kg)   SpO2 99%   BMI 29.16 kg/m  Gen: no acute distress, pleasant, cooperative. Room smells of marijuana. HEENT: normocephalic, atraumatic, moist mucous membranes  Heart: regular rate and rhythm, no murmur Lungs: clear to auscultation bilaterally, normal work of breathing  Neuro: alert, grossly nonfocal, speech normal Ext: No appreciable lower extremity edema bilaterally Rectal: normal tone. No gross blood. Unable to fully reach prostate during exam.  ASSESSMENT/PLAN:  Health maintenance:  -FIT testing ordered, due for yearly FOBT per GI doc  Anemia, iron deficiency Update FOBT today, per GI doctor needs these yearly.  HYPERTENSION, BENIGN ESSENTIAL Well controlled. Continue current regimen.   TOBACCO DEPENDENCE Encouraged smoking cessation again. Heavy marijuana user, smoking 3-4 tobacco cigs per day.  Proteinuria UA prelim shows 100 of protein. Will add urine prot:creat ratio if quantity sufficient, otherwise needs to be done at next visit.  BPH (benign prostatic hyperplasia) Suspect due to BPH  (patient with history of this per problem list). No family history of prostate cancer. Check UA & PSA.  Rectal exam performed which was limited as I could not fully reach his prostate.  Offered to have someone with longer fingers check but patient declined. Will start flomax and await PSA. Follow up in 6 weeks to see how symptoms are doing. Would recommend rechecking prostate exam with a provider with longer fingers at that time.  GASTROESOPHAGEAL REFLUX, NO ESOPHAGITIS Uncontrolled on daily protonix. Add ranitidine 1508mwice daily.   FOLLOW UP: Follow up in 6 weeks for BPH, GERD.  BriThaynecIArdelia MemsD [place]

## 2018-03-12 NOTE — Assessment & Plan Note (Signed)
Update FOBT today, per GI doctor needs these yearly.

## 2018-03-12 NOTE — Patient Instructions (Addendum)
Follow up in 6 weeks for your prostate. Take new medicine to help with urine flow.  Complete stool card and bring back to [place].  Checking labwork today.  Be well, Dr. Ardelia Mems

## 2018-03-12 NOTE — Assessment & Plan Note (Signed)
Uncontrolled on daily protonix. Add ranitidine 128m twice daily.

## 2018-03-13 LAB — CMP14+EGFR
A/G RATIO: 2 (ref 1.2–2.2)
ALT: 57 IU/L — AB (ref 0–44)
AST: 55 IU/L — ABNORMAL HIGH (ref 0–40)
Albumin: 4.7 g/dL (ref 3.6–4.8)
Alkaline Phosphatase: 85 IU/L (ref 39–117)
BUN/Creatinine Ratio: 8 — ABNORMAL LOW (ref 10–24)
BUN: 9 mg/dL (ref 8–27)
Bilirubin Total: 0.4 mg/dL (ref 0.0–1.2)
CALCIUM: 9.8 mg/dL (ref 8.6–10.2)
CO2: 19 mmol/L — ABNORMAL LOW (ref 20–29)
Chloride: 109 mmol/L — ABNORMAL HIGH (ref 96–106)
Creatinine, Ser: 1.15 mg/dL (ref 0.76–1.27)
GFR, EST AFRICAN AMERICAN: 77 mL/min/{1.73_m2} (ref >59)
GFR, EST NON AFRICAN AMERICAN: 67 mL/min/{1.73_m2} (ref >59)
GLOBULIN, TOTAL: 2.4 g/dL (ref 1.5–4.5)
Glucose: 104 mg/dL — ABNORMAL HIGH (ref 65–99)
POTASSIUM: 3.8 mmol/L (ref 3.5–5.2)
Sodium: 144 mmol/L (ref 134–144)
TOTAL PROTEIN: 7.1 g/dL (ref 6.0–8.5)

## 2018-03-13 LAB — PROTEIN / CREATININE RATIO, URINE
Creatinine, Urine: 204 mg/dL
PROTEIN UR: 80.5 mg/dL
PROTEIN/CREAT RATIO: 395 mg/g{creat} — AB (ref 0–200)

## 2018-03-13 LAB — PSA: Prostate Specific Ag, Serum: 1.5 ng/mL (ref 0.0–4.0)

## 2018-03-19 DIAGNOSIS — D509 Iron deficiency anemia, unspecified: Secondary | ICD-10-CM | POA: Diagnosis not present

## 2018-03-21 LAB — FECAL OCCULT BLOOD, IMMUNOCHEMICAL: FECAL OCCULT BLD: NEGATIVE

## 2018-04-05 ENCOUNTER — Telehealth: Payer: Self-pay | Admitting: Family Medicine

## 2018-04-05 DIAGNOSIS — R740 Nonspecific elevation of levels of transaminase and lactic acid dehydrogenase [LDH]: Secondary | ICD-10-CM

## 2018-04-05 DIAGNOSIS — R809 Proteinuria, unspecified: Secondary | ICD-10-CM

## 2018-04-05 DIAGNOSIS — R7401 Elevation of levels of liver transaminase levels: Secondary | ICD-10-CM

## 2018-04-05 NOTE — Telephone Encounter (Signed)
Called patient to discuss labs Elevated protein:creat ratio, will send back to nephrology Also continues to have increased LFTs. Will send to hepatology. Patient appreciative & agreeable.  Leeanne Rio, MD

## 2018-04-08 ENCOUNTER — Ambulatory Visit (INDEPENDENT_AMBULATORY_CARE_PROVIDER_SITE_OTHER): Payer: Medicare Other | Admitting: *Deleted

## 2018-04-08 DIAGNOSIS — I4891 Unspecified atrial fibrillation: Secondary | ICD-10-CM | POA: Diagnosis not present

## 2018-04-08 DIAGNOSIS — Z5181 Encounter for therapeutic drug level monitoring: Secondary | ICD-10-CM

## 2018-04-08 LAB — POCT INR: INR: 2.7 (ref 2.0–3.0)

## 2018-04-08 NOTE — Patient Instructions (Signed)
Description   Continue on same dosage 1 tablet everyday except 1.5 tablets on Mondays and Fridays.   Recheck INR in 4 weeks. Call if placed on any new medications [phone]

## 2018-04-10 ENCOUNTER — Other Ambulatory Visit: Payer: Self-pay | Admitting: Cardiovascular Disease

## 2018-04-19 ENCOUNTER — Ambulatory Visit: Payer: Medicare Other | Admitting: Family Medicine

## 2018-04-22 DIAGNOSIS — M2550 Pain in unspecified joint: Secondary | ICD-10-CM | POA: Diagnosis not present

## 2018-04-22 DIAGNOSIS — Z79899 Other long term (current) drug therapy: Secondary | ICD-10-CM | POA: Diagnosis not present

## 2018-04-22 DIAGNOSIS — M1A09X1 Idiopathic chronic gout, multiple sites, with tophus (tophi): Secondary | ICD-10-CM | POA: Diagnosis not present

## 2018-04-26 ENCOUNTER — Encounter

## 2018-05-04 ENCOUNTER — Other Ambulatory Visit: Payer: Self-pay | Admitting: Family Medicine

## 2018-05-09 ENCOUNTER — Encounter: Payer: Self-pay | Admitting: Cardiovascular Disease

## 2018-05-09 ENCOUNTER — Ambulatory Visit (INDEPENDENT_AMBULATORY_CARE_PROVIDER_SITE_OTHER): Payer: Medicare Other | Admitting: Cardiovascular Disease

## 2018-05-09 ENCOUNTER — Ambulatory Visit (INDEPENDENT_AMBULATORY_CARE_PROVIDER_SITE_OTHER): Payer: Medicare Other | Admitting: *Deleted

## 2018-05-09 VITALS — BP 104/62 | HR 46 | Ht 66.0 in | Wt 183.0 lb

## 2018-05-09 DIAGNOSIS — I340 Nonrheumatic mitral (valve) insufficiency: Secondary | ICD-10-CM

## 2018-05-09 DIAGNOSIS — Z5181 Encounter for therapeutic drug level monitoring: Secondary | ICD-10-CM

## 2018-05-09 DIAGNOSIS — I2510 Atherosclerotic heart disease of native coronary artery without angina pectoris: Secondary | ICD-10-CM | POA: Diagnosis not present

## 2018-05-09 DIAGNOSIS — I4891 Unspecified atrial fibrillation: Secondary | ICD-10-CM | POA: Diagnosis not present

## 2018-05-09 LAB — POCT INR: INR: 2.3 (ref 2.0–3.0)

## 2018-05-09 NOTE — Patient Instructions (Signed)
Medication Instructions:  Your physician recommends that you continue on your current medications as directed. Please refer to the Current Medication list given to you today.   Labwork: None Ordered   Testing/Procedures: Your physician has requested that you have an echocardiogram. Echocardiography is a painless test that uses sound waves to create images of your heart. It provides your doctor with information about the size and shape of your heart and how well your heart's chambers and valves are working. This procedure takes approximately one hour. There are no restrictions for this procedure.   Follow-Up: Your physician wants you to follow-up in: 6 months with a Nurse Practitioner or PA on Dr. Elmarie Shiley team.  You will receive a reminder letter in the mail two months in advance. If you don't receive a letter, please call our office to schedule the follow-up appointment.   If you need a refill on your cardiac medications before your next appointment, please call your pharmacy.   Thank you for choosing [facility]! Christen Bame, RN [phone]

## 2018-05-09 NOTE — Patient Instructions (Signed)
Description   Continue on same dosage 1 tablet everyday except 1.5 tablets on Mondays and Fridays.   Recheck INR in 5 weeks. Call if placed on any new medications [phone]

## 2018-05-09 NOTE — Progress Notes (Signed)
Cardiology Office Note   Date:  05/09/2018   ID:  Jesse Franklin, DOB May 02, 1951, MRN [id number]  PCP:  Leeanne Rio, MD  Cardiologist:   Mertie Moores, MD   Chief Complaint  Patient presents with  . Coronary Artery Disease   1. Atrial fibrillation -  2. Coronary artery disease RESULTS: 08/30/04 1. Left main: Normal.  2. LAD: Large vessel, tortuous in the mid and distal section with mild  luminal irregularities. The extreme distal portion of the LAD after the  trifurcation of the distal vessel has a subtotal occlusion at the  trifurcation tip. Vessel less than 0.5 mm at this juncture.  3. LXC: Codominant with mild luminal irregularities.  4. OM1 and OM2: Large vessels with mild luminal irregularities.  5. RCA: Tortuous, codominant with mild luminal irregularities, very faint  right to left filling collaterals seen at the apex.  6. LV: EF 50-55% with mild inferior apical hypokinesis. LVEDP was 35  mmHg.  7. Abdominal/distal aortogram showed mild tortuosity. No significant  aneurysms were seen. The distal aorta showed mild splaying to the left  at the area of the iliac bifurcation. No significant iliac or femoral  disease was noted.  3. Hypertension 4. 6. Moderate pulmonary hypertension by echo in 2010 ( ongoing cigarette smoking)  5. Hyperlipidemia   Jesse Franklin is a 67 yo with hx as noted above. He has occasional episodes of left arm tingling. These episodes last 1-2 seconds. They're not necessarily associated with exertion. The tingling seems to get better with arm movement. He also has occasional episodes of shortness of breath.  He walks on a regular basis. He walks approximately 1 mile a day. He still eats some salt and also eats fried and fast foods.  December 02, 2013:  Jesse Franklin is doing well. He denies any chest pain or shortness of breath. He walks about 15-20 minutes each day. He no longer works. He has cut down on his salt usage.    [name] 5,  2016:   Jesse Franklin is a 67 y.o. male who presents for follow-up of his atrial fibrillation. He also has a history of hypertension  Still smoking  -   Nov. 1, 2016:  Doing well from a cardiac standpoint.  Having gout issues  in left elbow and hand.  No Cp or dyspnea.  Still smoking   January 03, 2016:    Jesse Franklin is seen today  No CP , some back pain  Still smoking    December 25, 2016  occasional cp .  No changes in his angina pattern Still smoking   July 27, 2017:  Staying active , no further CP or dyspnea .  Sells loose cigaretes.  $0.50 per cigarette   May 09, 2018:  Jesse Franklin was seen today for follow-up visit. History of coronary artery disease and atrial fibrillation. He remains on Coumadin. HR is slow (junctional escape rhythm) sees.  He is not having any episodes of weakness or dizziness.  He has occasional episodes of left-sided arm and left-sided shoulder pain. Last few seconds. No chest pain with walking.  He still smokes 3 to 4 cigarettes a day. Also smokes marijuana   He has known coronary artery disease.  He has a distal LAD subtotal occlusion by heart catheterization in 2005.   Past Medical History:  Diagnosis Date  . Alcohol abuse   . Anemia   . Atrial fibrillation ([place])   . BPH 11/08/2006  . CAD (coronary artery disease)  [place] Vessel  . Cocaine abuse ([facility])    Hx of  . GERD (gastroesophageal reflux disease)   . Gout   . Herpes   . Herpes genitalia 04/13/2011  . Hyperlipidemia   . Hypertension   . Myocardial infarction ([place]) 2005  . RHINITIS, ALLERGIC 11/08/2006  . Tobacco abuse     Past Surgical History:  Procedure Laterality Date  . CARDIAC CATHETERIZATION  08/2004  . CARPAL TUNNEL RELEASE Right 04/04/2017   Procedure: RIGHT CARPAL TUNNEL RELEASE;  Surgeon: Leandrew Koyanagi, MD;  Location: [facility];  Service: Orthopedics;  Laterality: Right;  . LACERATION REPAIR Right 03/1988   "hand"     Current Outpatient  Medications  Medication Sig Dispense Refill  . allopurinol (ZYLOPRIM) 100 MG tablet Take 100 mg by mouth 2 (two) times daily.     Marland Kitchen amLODipine (NORVASC) 10 MG tablet TAKE 1 TABLET (10 MG TOTAL) BY MOUTH DAILY. 90 tablet 1  . clopidogrel (PLAVIX) 75 MG tablet TAKE 1 TABLET BY MOUTH EVERY DAY 90 tablet 1  . colchicine 0.6 MG tablet TAKE 1 TABLET BY MOUTH TWICE A DAY 180 tablet 0  . ferrous sulfate 325 (65 FE) MG tablet TAKE 1 TABLET BY MOUTH EVERY DAY WITH BREAKFAST 90 tablet 0  . nitroGLYCERIN (NITROSTAT) 0.4 MG SL tablet Place 1 tablet (0.4 mg total) under the tongue as directed. Place 1 tab under tongue as directed 25 tablet 6  . pantoprazole (PROTONIX) 40 MG tablet TAKE 1 TABLET (40 MG TOTAL) BY MOUTH DAILY. 90 tablet 1  . ranitidine (ZANTAC) 150 MG tablet TAKE 1 TABLET BY MOUTH TWICE A DAY 60 tablet 3  . rosuvastatin (CRESTOR) 40 MG tablet TAKE 1 TABLET BY MOUTH EVERY DAY 90 tablet 3  . sildenafil (REVATIO) 20 MG tablet Take 5 tablets (100 mg total) as needed by mouth. 50 tablet 3  . tamsulosin (FLOMAX) 0.4 MG CAPS capsule Take 1 capsule (0.4 mg total) by mouth daily. 30 capsule 3  . warfarin (COUMADIN) 5 MG tablet TAKE AS DIRECTED BY COUMADIN CLINIC 120 tablet 1   No current facility-administered medications for this visit.    Facility-Administered Medications Ordered in Other Visits  Medication Dose Route Frequency Provider Last Rate Last Dose  . 0.9 %  sodium chloride infusion   Intravenous Continuous Willeen Niece, MD 125 mL/hr at 07/31/14 1200      Allergies:   Codeine and Ketorolac tromethamine    Social History:  The patient  reports that he has been smoking cigarettes. He has a 12.00 pack-year smoking history. He quit smokeless tobacco use about 50 years ago.  His smokeless tobacco use included chew. He reports that he has current or past drug history. Drugs: "Crack" cocaine and Marijuana. He reports that he does not drink alcohol.   Family History:  The patient's family  history includes Alcohol abuse in his brother and father; Arthritis in his brother; Cancer in his sister; Gout in his mother; Heart disease in his brother; Hypertension in his mother and sister.    ROS:   Noted in current history, otherwise review of systems is negative.  Physical Exam: Blood pressure 104/62, pulse (!) 46, height 5' 6"  (1.676 m), weight 183 lb (83 kg), SpO2 97 %.  GEN:  Well nourished, well developed in no acute distress HEENT: Normal NECK: No JVD; No carotid bruits LYMPHATICS: No lymphadenopathy CARDIAC: RRR ,  Soft systolic murmur  RESPIRATORY:  Clear to auscultation without rales, wheezing or rhonchi  ABDOMEN: Soft, non-tender, non-distended MUSCULOSKELETAL:  No edema; No deformity  SKIN: Warm and dry NEUROLOGIC:  Alert and oriented x 3   EKG:   Aug. 29, 2019:   AF with junctional escape.   HR is 46.  TWI in lateral leads.     Recent Labs: 08/31/2017: Hemoglobin 13.0; Platelets 157 03/12/2018: ALT 57; BUN 9; Creatinine, Ser 1.15; Potassium 3.8; Sodium 144    Lipid Panel    Component Value Date/Time   CHOL 141 07/27/2017 1205   TRIG 156 (H) 07/27/2017 1205   HDL 38 (L) 07/27/2017 1205   CHOLHDL 3.7 07/27/2017 1205   CHOLHDL 3.4 08/02/2016 0913   VLDL 26 08/02/2016 0913   LDLCALC 72 07/27/2017 1205   LDLDIRECT 95 05/28/2012 0925      Wt Readings from Last 3 Encounters:  05/09/18 183 lb (83 kg)  03/12/18 183 lb 6.4 oz (83.2 kg)  08/31/17 178 lb 3.2 oz (80.8 kg)      Other studies Reviewed: Additional studies/ records that were reviewed today include: . Review of the above records demonstrates:    ASSESSMENT AND PLAN:  1. Atrial fibrillation-rate is well controlled.  Continue Coumadin.  He seems to be doing fairly well.  2. Coronary artery disease Cath  08/30/04   . He has a subtotal occlusion of the distal LAD.  He has mild CAD elsewhere.  I have advised him to stop smoking.  He still smokes several cigarettes a day as well as  marijuana..  3. Hypertension -pressure is well controlled.   4.  Moderate pulmonary hypertension by echo in 2010 ( ongoing cigarette smoking) -encouraged him to stop smoking.  We will get an echocardiogram to evaluate his pulmonary pressures.  5. Hyperlipidemia - managed by primary MD    Current medicines are reviewed at length with the patient today.  The patient does not have concerns regarding medicines.  The following changes have been made:  no change  Labs/ tests ordered today include:  No orders of the defined types were placed in this encounter.   Disposition:   FU with me in 6 months     Mertie Moores, MD  05/09/2018 11:09 AM    [address] [place], [place],   [address] Phone: [phone]; Fax: [phone]

## 2018-05-22 ENCOUNTER — Ambulatory Visit (HOSPITAL_COMMUNITY): Payer: Medicare Other | Attending: Cardiovascular Disease

## 2018-05-22 ENCOUNTER — Other Ambulatory Visit: Payer: Self-pay

## 2018-05-22 DIAGNOSIS — I340 Nonrheumatic mitral (valve) insufficiency: Secondary | ICD-10-CM

## 2018-05-22 DIAGNOSIS — E785 Hyperlipidemia, unspecified: Secondary | ICD-10-CM | POA: Insufficient documentation

## 2018-05-22 DIAGNOSIS — I272 Pulmonary hypertension, unspecified: Secondary | ICD-10-CM | POA: Insufficient documentation

## 2018-05-22 DIAGNOSIS — I082 Rheumatic disorders of both aortic and tricuspid valves: Secondary | ICD-10-CM | POA: Diagnosis not present

## 2018-05-22 DIAGNOSIS — I4891 Unspecified atrial fibrillation: Secondary | ICD-10-CM | POA: Diagnosis not present

## 2018-05-22 DIAGNOSIS — Z720 Tobacco use: Secondary | ICD-10-CM | POA: Insufficient documentation

## 2018-05-22 DIAGNOSIS — I2510 Atherosclerotic heart disease of native coronary artery without angina pectoris: Secondary | ICD-10-CM | POA: Diagnosis not present

## 2018-05-22 DIAGNOSIS — I10 Essential (primary) hypertension: Secondary | ICD-10-CM | POA: Diagnosis not present

## 2018-05-28 ENCOUNTER — Other Ambulatory Visit: Payer: Self-pay | Admitting: Family Medicine

## 2018-06-13 ENCOUNTER — Ambulatory Visit (INDEPENDENT_AMBULATORY_CARE_PROVIDER_SITE_OTHER): Payer: Medicare Other | Admitting: *Deleted

## 2018-06-13 DIAGNOSIS — I4891 Unspecified atrial fibrillation: Secondary | ICD-10-CM

## 2018-06-13 DIAGNOSIS — Z5181 Encounter for therapeutic drug level monitoring: Secondary | ICD-10-CM | POA: Diagnosis not present

## 2018-06-13 LAB — POCT INR: INR: 2.6 (ref 2.0–3.0)

## 2018-06-13 NOTE — Patient Instructions (Signed)
Description   Continue on same dosage 1 tablet everyday except 1.5 tablets on Mondays and Fridays.  Recheck INR in 6 weeks. Call if placed on any new medications [phone]

## 2018-06-17 ENCOUNTER — Ambulatory Visit: Payer: Medicare Other

## 2018-06-19 DIAGNOSIS — I2510 Atherosclerotic heart disease of native coronary artery without angina pectoris: Secondary | ICD-10-CM | POA: Diagnosis not present

## 2018-06-19 DIAGNOSIS — I272 Pulmonary hypertension, unspecified: Secondary | ICD-10-CM | POA: Diagnosis not present

## 2018-06-19 DIAGNOSIS — R809 Proteinuria, unspecified: Secondary | ICD-10-CM | POA: Diagnosis not present

## 2018-06-19 DIAGNOSIS — E785 Hyperlipidemia, unspecified: Secondary | ICD-10-CM | POA: Diagnosis not present

## 2018-06-19 DIAGNOSIS — I10 Essential (primary) hypertension: Secondary | ICD-10-CM | POA: Diagnosis not present

## 2018-06-26 ENCOUNTER — Other Ambulatory Visit: Payer: Self-pay | Admitting: Family Medicine

## 2018-06-26 DIAGNOSIS — K759 Inflammatory liver disease, unspecified: Secondary | ICD-10-CM | POA: Diagnosis not present

## 2018-06-26 DIAGNOSIS — R932 Abnormal findings on diagnostic imaging of liver and biliary tract: Secondary | ICD-10-CM | POA: Diagnosis not present

## 2018-06-26 DIAGNOSIS — Z7901 Long term (current) use of anticoagulants: Secondary | ICD-10-CM | POA: Diagnosis not present

## 2018-06-26 DIAGNOSIS — R748 Abnormal levels of other serum enzymes: Secondary | ICD-10-CM | POA: Diagnosis not present

## 2018-06-30 ENCOUNTER — Other Ambulatory Visit: Payer: Self-pay | Admitting: Family Medicine

## 2018-07-09 ENCOUNTER — Other Ambulatory Visit: Payer: Self-pay | Admitting: Nurse Practitioner

## 2018-07-09 DIAGNOSIS — R945 Abnormal results of liver function studies: Principal | ICD-10-CM

## 2018-07-09 DIAGNOSIS — F1010 Alcohol abuse, uncomplicated: Secondary | ICD-10-CM

## 2018-07-09 DIAGNOSIS — R7989 Other specified abnormal findings of blood chemistry: Secondary | ICD-10-CM

## 2018-07-16 ENCOUNTER — Telehealth: Payer: Self-pay | Admitting: Family Medicine

## 2018-07-16 NOTE — Telephone Encounter (Signed)
Spoke with pt reminding them of/confirming their appt for tomorrow on 07/17/2018. -[place]

## 2018-07-17 ENCOUNTER — Ambulatory Visit (INDEPENDENT_AMBULATORY_CARE_PROVIDER_SITE_OTHER): Payer: Medicare Other

## 2018-07-17 VITALS — BP 128/80 | HR 59 | Temp 98.1°F | Ht 66.0 in | Wt 182.4 lb

## 2018-07-17 DIAGNOSIS — H9190 Unspecified hearing loss, unspecified ear: Secondary | ICD-10-CM

## 2018-07-17 DIAGNOSIS — Z0000 Encounter for general adult medical examination without abnormal findings: Secondary | ICD-10-CM

## 2018-07-17 NOTE — Progress Notes (Signed)
Subjective:   Jesse Franklin is a 67 y.o. male who presents for Medicare Annual/Subsequent preventive examination.  Review of Systems:  Physical assessment deferred to PCP.  Cardiac Risk Factors include: advanced age (>8mn, >>56women);smoking/ tobacco exposure;male gender;hypertension     Objective:    Vitals: BP 128/80   Pulse (!) 59   Temp 98.1 F (36.7 C) (Oral)   Ht 5' 6"  (1.676 m)   Wt 182 lb 6.4 oz (82.7 kg)   SpO2 98%   BMI 29.44 kg/m   Body mass index is 29.44 kg/m.  Advanced Directives 07/17/2018 03/12/2018 08/31/2017 04/04/2017 03/29/2017 02/13/2017 12/26/2016  Does Patient Have a Medical Advance Directive? No No No No No No No  Type of Advance Directive - - - - - - -  Does patient want to make changes to medical advance directive? - - - - - - -  [place] [facility]? - - - - - - -  Would patient like information on creating a medical advance directive? No - Patient declined No - Patient declined No - Patient declined No - Patient declined - No - Patient declined No - Patient declined    Tobacco Social History   Tobacco Use  Smoking Status Current Every Day Smoker  . Packs/day: 0.25  . Years: 48.00  . Pack years: 12.00  . Types: Cigarettes  Smokeless Tobacco 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 Quit date: 09/12/1967  Tobacco Comment   smoking 3-4 cigs/day     Ready to quit: No Counseling given: Yes Comment: smoking 3-4 cigs/day  Clinical Intake:  Pre-visit preparation completed: Yes  Pain : No/denies pain Pain Score: 0-No pain    Nutritional Status: BMI 25 -29 Overweight Nutritional Risks: None Diabetes: No  How often do you need to have someone help you when you read instructions, pamphlets, or other written materials from your doctor or pharmacy?: 3 - Sometimes What is the last grade level you completed in school?: 8th grade  Interpreter Needed?: No    Past Medical History:  Diagnosis Date  . Alcohol abuse   . Anemia     . Atrial fibrillation ([facility]   . BPH 11/08/2006  . CAD (coronary artery disease)    Branch Vessel  . Cocaine abuse ([facility])    Hx of  . GERD (gastroesophageal reflux disease)   . Gout   . Herpes   . Herpes genitalia 04/13/2011  . Hyperlipidemia   . Hypertension   . [place]ocardial [place] ([facility] 2005  . RHINITIS, ALLERGIC 11/08/2006  . Tobacco abuse    Past Surgical History:  Procedure Laterality Date  . CARDIAC CATHETERIZATION  08/2004  . CARPAL TUNNEL RELEASE Right 04/04/2017   Procedure: RIGHT CARPAL TUNNEL RELEASE;  Surgeon: XLeandrew Koyanagi MD;  Location[facility]  Service: Orthopedics;  Laterality: Right;  . LACERATION REPAIR Right 03/1988   "hand"   Family History  Problem Relation Age of Onset  . Alcohol abuse Father   . Heart disease Brother   . Alcohol abuse Brother   . Cancer Sister   . Hypertension Mother   . Gout Mother   . Arthritis Brother   . Hypertension Sister    Social History   Socioeconomic History  . Marital status: Divorced    Spouse name: Not on file  . Number of children: 0  . Years of education: 8  . Highest education level: 8th grade  Occupational History  .  Occupation: retired- Editor, commissioning: UNEMPLOYED  . Occupation: motel work  Scientific laboratory technician  . Financial resource strain: Not hard at all  . Food insecurity:    Worry: Never true    Inability: Never true  . Transportation needs:    Medical: No    Non-medical: No  Tobacco Use  . Smoking status: Current Every Day Smoker    Packs/day: 0.25    Years: 48.00    Pack years: 12.00    Types: Cigarettes  . Smokeless tobacco: Former Systems developer    Types: Chew    Quit date: 09/12/1967  . Tobacco comment: smoking 3-4 cigs/day  Substance and Sexual Activity  . Alcohol use: No  . Drug use: Yes    Types: "Crack" cocaine, Marijuana    Comment: past history of cocaine use. Patient admits to marijuana use.   Marland Kitchen Sexual activity: Yes    Birth control/protection: Condom   Lifestyle  . Physical activity:    Days per week: 7 days    Minutes per session: 30 min  . Stress: Not at all  Relationships  . Social connections:    Talks on phone: Once a week    Gets together: Once a week    Attends religious service: Never    Active member of club or organization: No    Attends meetings of clubs or organizations: Never    Relationship status: Divorced  Other Topics Concern  . Not on file  Social History Narrative   Who lives with you: self and dog, mini doberman named Grandma   Lives in apartment on ground floor. Apt has handrail on stair. Has smoke alarm in apartment. No throw rugs. No grab bars in bathroom.   Diet: Pt has a varied diet. Avoids red meat, due to gout. Eats chicken, veggies, fruit.    Exercise: Pt reports walking 20/30 minutes almost every day walking dog.   Seatbelts: Pt reports wearing seatbelt when in vehicles.    Hobbies: walking, watching tv, go to park with dog. Avoids crowds.    Smokes marijuana "keeps me from hearing voices". No alcohol. Daily cigarette smoker. Does not want to quit.   Sexually active, condoms given for STI protection.          Outpatient Encounter Medications as of 07/17/2018  Medication Sig  . allopurinol (ZYLOPRIM) 100 MG tablet Take 100 mg by mouth 2 (two) times daily.   Marland Kitchen amLODipine (NORVASC) 10 MG tablet TAKE 1 TABLET BY MOUTH EVERY DAY  . clopidogrel (PLAVIX) 75 MG tablet TAKE 1 TABLET BY MOUTH EVERY DAY  . colchicine 0.6 MG tablet TAKE 1 TABLET BY MOUTH TWICE A DAY  . ferrous sulfate 325 (65 FE) MG tablet TAKE 1 TABLET BY MOUTH EVERY DAY WITH BREAKFAST  . pantoprazole (PROTONIX) 40 MG tablet TAKE 1 TABLET (40 MG TOTAL) BY MOUTH DAILY.  . ranitidine (ZANTAC) 150 MG tablet TAKE 1 TABLET BY MOUTH TWICE A DAY  . rosuvastatin (CRESTOR) 40 MG tablet TAKE 1 TABLET BY MOUTH EVERY DAY  . sildenafil (REVATIO) 20 MG tablet Take 5 tablets (100 mg total) as needed by mouth.  . tamsulosin (FLOMAX) 0.4 MG CAPS capsule  TAKE 1 CAPSULE BY MOUTH EVERY DAY  . warfarin (COUMADIN) 5 MG tablet TAKE AS DIRECTED BY COUMADIN CLINIC  . nitroGLYCERIN (NITROSTAT) 0.4 MG SL tablet Place 1 tablet (0.4 mg total) under the tongue as directed. Place 1 tab under tongue as directed   Facility-Administered Encounter Medications as  of 07/17/2018  Medication  . 0.9 %  sodium chloride infusion    Activities of Daily Living In your present state of health, do you have any difficulty performing the following activities: 07/17/2018  Hearing? Y  Comment left ear thinks is a little worse  Vision? N  Comment wears reading glasses  Difficulty concentrating or making decisions? N  Walking or climbing stairs? N  Dressing or bathing? N  Doing errands, shopping? N  Preparing Food and eating ? N  Using the Toilet? N  In the past six months, have you accidently leaked urine? N  Do you have problems with loss of bowel control? N  Managing your Medications? N  Managing your Finances? N  Housekeeping or managing your Housekeeping? N  Some recent data might be hidden    Patient Care Team: Leeanne Rio, MD as PCP - General (Pediatrics) Nahser, Wonda Cheng, MD as PCP - Cardiology (Cardiology)   Assessment:   This is a routine wellness examination for Londyn.  Exercise Activities and Dietary recommendations Current Exercise Habits: Home exercise routine, Type of exercise: walking, Time (Minutes): 30, Frequency (Times/Week): 7, Weekly Exercise (Minutes/Week): 210, Intensity: Mild, Exercise limited by: orthopedic condition(s)  Goals    . Blood Pressure < 140/90       Fall Risk Fall Risk  07/17/2018 03/12/2018 08/31/2017 02/13/2017 05/16/2016  Falls in the past year? 0 No No No No  Risk for fall due to : - - - - -   Is the patient's home free of loose throw rugs in walkways, pet beds, electrical cords, etc?   yes      Grab bars in the bathroom? no      Handrails on the stairs?   yes      Adequate lighting?   yes   Depression  Screen PHQ 2/9 Scores 07/17/2018 03/12/2018 08/31/2017 02/13/2017  PHQ - 2 Score 0 0 0 0  PHQ- 9 Score - - - -    Cognitive Function MMSE - Mini Mental State Exam 07/17/2018 01/20/2014 01/16/2013  Orientation to time 5 5 5   Orientation to Place 5 5 5   Registration 3 3 3   Attention/ Calculation 5 0 0  Recall 3 2 3   Language- name 2 objects 2 2 2   Language- repeat 1 1 1   Language- follow 3 step command 3 3 3   Language- read & follow direction 1 1 0  Write a sentence 1 1 0  Copy design 1 1 0  Total score 30 24 22      6CIT Screen 07/17/2018  What Year? 0 points  What month? 0 points  What time? 0 points  Count back from 20 0 points  Months in reverse 0 points  Repeat phrase 0 points  Total Score 0    Immunization History  Administered Date(s) Administered  . Influenza,inj,Quad PF,6+ Mos 07/08/2014  . Td 09/11/1997, 06/04/2008   Patient declined both flu vaccine and pneumonia vaccine.  Screening Tests Health Maintenance  Topic Date Due  . INFLUENZA VACCINE  01/09/2019 (Originally 04/11/2018)  . TETANUS/TDAP  07/18/2019 (Originally 06/04/2018)  . PNA vac Low Risk Adult (1 of 2 - PCV13) 07/18/2019 (Originally 04/04/2018)  . COLON CANCER SCREENING ANNUAL FOBT  03/20/2019  . COLONOSCOPY  11/14/2023  . Hepatitis C Screening  Completed  . HIV Screening  Completed   Cancer Screenings: Lung: Low Dose CT Chest recommended if Age 40-80 years, 30 pack-year currently smoking OR have quit w/in 15years. Patient does qualify.  Colorectal: up to date   Additional Screenings:  Hepatitis C Screening: complete      Plan:  Audiology referral placed at patient request. Patient declined flu vaccine, pneumonia vaccine and TdaP vaccine. Patient was given condoms to protect from STI. F/U appt made with PCP.   I have personally reviewed and noted the following in the patient's chart:   . Medical and social history . Use of alcohol, tobacco or illicit drugs  . Current medications and  supplements . Functional ability and status . Nutritional status . Physical activity . Advanced directives . List of other physicians . Hospitalizations, surgeries, and ER visits in previous 12 months . Vitals . Screenings to include cognitive, depression, and falls . Referrals and appointments  In addition, I have reviewed and discussed with patient certain preventive protocols, quality metrics, and best practice recommendations. A written personalized care plan for preventive services as well as general preventive health recommendations were provided to patient.     Esau Grew, RN  07/17/2018

## 2018-07-17 NOTE — Patient Instructions (Addendum)
Mr. Kichline , Thank you for taking time to come for your Medicare Wellness Visit. I appreciate your ongoing commitment to your health goals. Please review the following plan we discussed and let me know if I can assist you in the future.   Please keep your appointment with Dr. Ardelia Mems on 08/02/18. Please consider the flu and pneumonia vaccine.  These are the goals we discussed: Goals    . Blood Pressure < 140/90       This is a list of the screening recommended for you and due dates:  Health Maintenance  Topic Date Due  . Flu Shot  01/09/2019*  . Tetanus Vaccine  07/18/2019*  . Pneumonia vaccines (1 of 2 - PCV13) 07/18/2019*  . Stool Blood Test  03/20/2019  . Colon Cancer Screening  11/14/2023  .  Hepatitis C: One time screening is recommended by Center for Disease Control  (CDC) for  adults born from [birth year] through 1963.   Completed  . HIV Screening  Completed  *Topic was postponed. The date shown is not the original due date.     Fall Prevention in the Home Falls can cause injuries. They can happen to people of all ages. There are many things you can do to make your home safe and to help prevent falls. What can I do on the outside of my home?  Regularly fix the edges of walkways and driveways and fix any cracks.  Remove anything that might make you trip as you walk through a door, such as a raised step or threshold.  Trim any bushes or trees on the path to your home.  Use bright outdoor lighting.  Clear any walking paths of anything that might make someone trip, such as rocks or tools.  Regularly check to see if handrails are loose or broken. Make sure that both sides of any steps have handrails.  Any raised decks and porches should have guardrails on the edges.  Have any leaves, snow, or ice cleared regularly.  Use sand or salt on walking paths during winter.  Clean up any spills in your garage right away. This includes oil or grease spills. What can I do in the  bathroom?  Use night lights.  Install grab bars by the toilet and in the tub and shower. Do not use towel bars as grab bars.  Use non-skid mats or decals in the tub or shower.  If you need to sit down in the shower, use a plastic, non-slip stool.  Keep the floor dry. Clean up any water that spills on the floor as soon as it happens.  Remove soap buildup in the tub or shower regularly.  Attach bath mats securely with double-sided non-slip rug tape.  Do not have throw rugs and other things on the floor that can make you trip. What can I do in the bedroom?  Use night lights.  Make sure that you have a light by your bed that is easy to reach.  Do not use any sheets or blankets that are too big for your bed. They should not hang down onto the floor.  Have a firm chair that has side arms. You can use this for support while you get dressed.  Do not have throw rugs and other things on the floor that can make you trip. What can I do in the kitchen?  Clean up any spills right away.  Avoid walking on wet floors.  Keep items that you use a lot in  easy-to-reach places.  If you need to reach something above you, use a strong step stool that has a grab bar.  Keep electrical cords out of the way.  Do not use floor polish or wax that makes floors slippery. If you must use wax, use non-skid floor wax.  Do not have throw rugs and other things on the floor that can make you trip. What can I do with my stairs?  Do not leave any items on the stairs.  Make sure that there are handrails on both sides of the stairs and use them. Fix handrails that are broken or loose. Make sure that handrails are as long as the stairways.  Check any carpeting to make sure that it is firmly attached to the stairs. Fix any carpet that is loose or worn.  Avoid having throw rugs at the top or bottom of the stairs. If you do have throw rugs, attach them to the floor with carpet tape.  Make sure that you have a  light switch at the top of the stairs and the bottom of the stairs. If you do not have them, ask someone to add them for you. What else can I do to help prevent falls?  Wear shoes that: ? Do not have high heels. ? Have rubber bottoms. ? Are comfortable and fit you well. ? Are closed at the toe. Do not wear sandals.  If you use a stepladder: ? Make sure that it is fully opened. Do not climb a closed stepladder. ? Make sure that both sides of the stepladder are locked into place. ? Ask someone to hold it for you, if possible.  Clearly mark and make sure that you can see: ? Any grab bars or handrails. ? First and last steps. ? Where the edge of each step is.  Use tools that help you move around (mobility aids) if they are needed. These include: ? Canes. ? Walkers. ? Scooters. ? Crutches.  Turn on the lights when you go into a dark area. Replace any light bulbs as soon as they burn out.  Set up your furniture so you have a clear path. Avoid moving your furniture around.  If any of your floors are uneven, fix them.  If there are any pets around you, be aware of where they are.  Review your medicines with your doctor. Some medicines can make you feel dizzy. This can increase your chance of falling. Ask your doctor what other things that you can do to help prevent falls. This information is not intended to replace advice given to you by your health care provider. Make sure you discuss any questions you have with your health care provider. Document Released: 06/24/2009 Document Revised: 02/03/2016 Document Reviewed: 10/02/2014 Elsevier Interactive Patient Education  Jesse Franklin.

## 2018-07-25 ENCOUNTER — Ambulatory Visit (INDEPENDENT_AMBULATORY_CARE_PROVIDER_SITE_OTHER): Payer: Medicare Other | Admitting: *Deleted

## 2018-07-25 ENCOUNTER — Other Ambulatory Visit: Payer: Self-pay | Admitting: Cardiovascular Disease

## 2018-07-25 DIAGNOSIS — I4891 Unspecified atrial fibrillation: Secondary | ICD-10-CM | POA: Diagnosis not present

## 2018-07-25 DIAGNOSIS — Z5181 Encounter for therapeutic drug level monitoring: Secondary | ICD-10-CM | POA: Diagnosis not present

## 2018-07-25 LAB — POCT INR: INR: 2.5 (ref 2.0–3.0)

## 2018-07-25 NOTE — Patient Instructions (Signed)
Description   Continue on same dosage 1 tablet everyday except 1.5 tablets on Mondays and Fridays.  Recheck INR in 6 weeks. Call if placed on any new medications [phone]

## 2018-07-29 ENCOUNTER — Ambulatory Visit
Admission: RE | Admit: 2018-07-29 | Discharge: 2018-07-29 | Disposition: A | Discharge status: Home / Self Care | Payer: Medicare Other | Source: Ambulatory Visit | Attending: Nurse Practitioner | Admitting: Nurse Practitioner

## 2018-07-29 ENCOUNTER — Other Ambulatory Visit: Payer: Self-pay | Admitting: Nurse Practitioner

## 2018-07-29 ENCOUNTER — Other Ambulatory Visit: Payer: Self-pay | Admitting: Cardiovascular Disease

## 2018-07-29 DIAGNOSIS — F1010 Alcohol abuse, uncomplicated: Secondary | ICD-10-CM

## 2018-07-29 DIAGNOSIS — R7989 Other specified abnormal findings of blood chemistry: Secondary | ICD-10-CM

## 2018-07-29 DIAGNOSIS — R945 Abnormal results of liver function studies: Principal | ICD-10-CM

## 2018-07-29 DIAGNOSIS — K7689 Other specified diseases of liver: Secondary | ICD-10-CM | POA: Diagnosis not present

## 2018-07-29 MED ORDER — SILDENAFIL CITRATE 50 MG PO TABS: 50.0000 mg | ORAL_TABLET | Freq: Every day | ORAL | 3 refills | Status: DC | PRN

## 2018-07-29 NOTE — Telephone Encounter (Signed)
Pt's pharmacy is requesting a refill on sildenafil. Please address

## 2018-08-02 ENCOUNTER — Encounter: Payer: Self-pay | Admitting: Family Medicine

## 2018-08-02 ENCOUNTER — Other Ambulatory Visit: Payer: Self-pay

## 2018-08-02 ENCOUNTER — Ambulatory Visit (INDEPENDENT_AMBULATORY_CARE_PROVIDER_SITE_OTHER): Payer: Medicare Other | Admitting: Family Medicine

## 2018-08-02 DIAGNOSIS — I10 Essential (primary) hypertension: Secondary | ICD-10-CM

## 2018-08-02 DIAGNOSIS — R809 Proteinuria, unspecified: Secondary | ICD-10-CM | POA: Diagnosis not present

## 2018-08-02 DIAGNOSIS — K219 Gastro-esophageal reflux disease without esophagitis: Secondary | ICD-10-CM

## 2018-08-02 DIAGNOSIS — R351 Nocturia: Secondary | ICD-10-CM

## 2018-08-02 DIAGNOSIS — R748 Abnormal levels of other serum enzymes: Secondary | ICD-10-CM

## 2018-08-02 DIAGNOSIS — N401 Enlarged prostate with lower urinary tract symptoms: Secondary | ICD-10-CM

## 2018-08-02 DIAGNOSIS — F17200 Nicotine dependence, unspecified, uncomplicated: Secondary | ICD-10-CM

## 2018-08-02 NOTE — Assessment & Plan Note (Signed)
Now seeing [facility].

## 2018-08-02 NOTE — Progress Notes (Signed)
Date of Visit: 08/02/2018   HPI:  Patient presents for routine follow up.  Hypertension - currently taking amlodipine 30m daily. Denies chest pain or shortness of breath.  GERD - taking zantac 1522mtwice daily and protonix 4098maily. Reflux symptoms doing well on this medication.   BPH - Doing better on flomax. Not getting up at night as much to urinate. Declines prostate exam today.  Elevated LFTs - [facility], has follow up in place on 11/28 with them. Patient reports being told everything was fine.   Proteinuria - saw nephrology last month. No records available.   Tobacco abuse - smoking 3.5 cigs per day. Still using marijuana regularly.  ROS: See HPI.  [place] Plains[place]pertension, tobacco abuse, hyperlipidemia, gout, afib, CAD   PHYSICAL EXAM: BP 128/80   Pulse (!) 45   Temp 98.1 F (36.7 C) (Oral)   Ht 5' 6"  (1.676 m)   Wt 183 lb 3.2 oz (83.1 kg)   SpO2 99%   BMI 29.57 kg/m   Pulse 54 on my check Gen: no acute distress, pleasant, cooperative HEENT: normocephalic, atraumatic, moist mucous membranes  Heart: irregularly irreg, no murmur Lungs: clear to auscultation bilaterally, normal work of breathing  Neuro: alert, grossly nonfocal, speech normal Ext: No appreciable lower extremity edema bilaterally   ASSESSMENT/PLAN:  Health maintenance:  -current on HM items  HYPERTENSION, BENIGN ESSENTIAL Well controlled. Continue current regimen.   TOBACCO DEPENDENCE Continued to encourage cessation.  GASTROESOPHAGEAL REFLUX, NO ESOPHAGITIS Doing well, continue current regimen.  Proteinuria Now following with nephro, will watch for records  BPH (benign prostatic hyperplasia) Symptoms improved on flomax, continue. Patient declines prostate exam today (I wasn't able to reach his prostate last visit).  Elevated liver enzymes Now seeing [facility].  FOLLOW UP: Follow up in 6 months for chronic medical issues  BriTanzania McIArdelia MemsD [place]

## 2018-08-02 NOTE — Assessment & Plan Note (Signed)
Continued to encourage cessation.

## 2018-08-02 NOTE — Assessment & Plan Note (Signed)
Doing well, continue current regimen. 

## 2018-08-02 NOTE — Assessment & Plan Note (Signed)
Well-controlled.  Continue current regimen. 

## 2018-08-02 NOTE — Assessment & Plan Note (Signed)
Symptoms improved on flomax, continue. Patient declines prostate exam today (I wasn't able to reach his prostate last visit).

## 2018-08-02 NOTE — Assessment & Plan Note (Signed)
Now following with nephro, will watch for records

## 2018-08-02 NOTE — Patient Instructions (Signed)
Stay on current medications Follow up with me in 6 months, sooner if needed  Be well, Dr. Ardelia Mems

## 2018-08-05 ENCOUNTER — Other Ambulatory Visit: Payer: Self-pay | Admitting: Cardiovascular Disease

## 2018-08-12 ENCOUNTER — Other Ambulatory Visit: Payer: Self-pay | Admitting: Family Medicine

## 2018-08-12 NOTE — Progress Notes (Signed)
Patient ID: Jesse Franklin, male   DOB: 1950/12/10, 67 y.o.   MRN: [id number] I have reviewed this visit and agree with the documentation.

## 2018-08-21 ENCOUNTER — Other Ambulatory Visit: Payer: Self-pay | Admitting: Family Medicine

## 2018-09-06 ENCOUNTER — Ambulatory Visit (INDEPENDENT_AMBULATORY_CARE_PROVIDER_SITE_OTHER): Payer: Medicare Other | Admitting: *Deleted

## 2018-09-06 DIAGNOSIS — Z5181 Encounter for therapeutic drug level monitoring: Secondary | ICD-10-CM

## 2018-09-06 DIAGNOSIS — I4891 Unspecified atrial fibrillation: Secondary | ICD-10-CM | POA: Diagnosis not present

## 2018-09-06 LAB — POCT INR: INR: 2.4 (ref 2.0–3.0)

## 2018-09-06 NOTE — Patient Instructions (Signed)
Description   Continue on same dosage 1 tablet everyday except 1.5 tablets on Mondays and Fridays.  Recheck INR in 6 weeks. Call if placed on any new medications [phone]

## 2018-09-20 ENCOUNTER — Other Ambulatory Visit: Payer: Self-pay | Admitting: Family Medicine

## 2018-09-26 ENCOUNTER — Other Ambulatory Visit: Payer: Self-pay | Admitting: Family Medicine

## 2018-10-03 ENCOUNTER — Other Ambulatory Visit: Payer: Self-pay | Admitting: Cardiovascular Disease

## 2018-10-08 DIAGNOSIS — K7581 Nonalcoholic steatohepatitis (NASH): Secondary | ICD-10-CM | POA: Diagnosis not present

## 2018-10-15 ENCOUNTER — Other Ambulatory Visit: Payer: Self-pay

## 2018-10-15 NOTE — Telephone Encounter (Signed)
Pt called nurse line requesting a refill on his allopurinol, clopidogrel and protonix. Please advise.

## 2018-10-18 ENCOUNTER — Ambulatory Visit (INDEPENDENT_AMBULATORY_CARE_PROVIDER_SITE_OTHER): Payer: Medicare Other | Admitting: Pharmacist

## 2018-10-18 ENCOUNTER — Encounter (INDEPENDENT_AMBULATORY_CARE_PROVIDER_SITE_OTHER): Payer: Self-pay

## 2018-10-18 DIAGNOSIS — I4891 Unspecified atrial fibrillation: Secondary | ICD-10-CM | POA: Diagnosis not present

## 2018-10-18 DIAGNOSIS — Z5181 Encounter for therapeutic drug level monitoring: Secondary | ICD-10-CM | POA: Diagnosis not present

## 2018-10-18 LAB — POCT INR: INR: 2.3 (ref 2.0–3.0)

## 2018-10-18 MED ORDER — PANTOPRAZOLE SODIUM 40 MG PO TBEC: 40.0000 mg | DELAYED_RELEASE_TABLET | Freq: Every day | ORAL | 3 refills | Status: DC

## 2018-10-18 MED ORDER — CLOPIDOGREL BISULFATE 75 MG PO TABS: 75.0000 mg | ORAL_TABLET | Freq: Every day | ORAL | 2 refills | Status: DC

## 2018-10-18 NOTE — Telephone Encounter (Signed)
Informed patient that he needs to request allopurinol from his rheumatologist but the RX for protonix and plavix was filled by Dr. Ardelia Mems.  Patient verbalized understanding and states that he will comply.  Jesse Franklin, [place]

## 2018-10-18 NOTE — Patient Instructions (Signed)
Description   Continue on same dosage 1 tablet everyday except 1.5 tablets on Mondays and Fridays.  Recheck INR in 6 weeks. Call if placed on any new medications [phone]

## 2018-10-18 NOTE — Telephone Encounter (Signed)
Allopurinol is prescribed by his rheumatologist [facility]. Please have him contact them for a refill.  I will send in the protonix and plavix. Thanks Leeanne Rio, MD

## 2018-10-19 ENCOUNTER — Other Ambulatory Visit: Payer: Self-pay | Admitting: Family Medicine

## 2018-10-23 ENCOUNTER — Other Ambulatory Visit: Payer: Self-pay | Admitting: Family Medicine

## 2018-11-29 ENCOUNTER — Other Ambulatory Visit: Payer: Self-pay

## 2018-11-29 ENCOUNTER — Ambulatory Visit (INDEPENDENT_AMBULATORY_CARE_PROVIDER_SITE_OTHER): Payer: Medicare Other | Admitting: *Deleted

## 2018-11-29 ENCOUNTER — Encounter (INDEPENDENT_AMBULATORY_CARE_PROVIDER_SITE_OTHER): Payer: Self-pay

## 2018-11-29 DIAGNOSIS — I4891 Unspecified atrial fibrillation: Secondary | ICD-10-CM

## 2018-11-29 DIAGNOSIS — Z5181 Encounter for therapeutic drug level monitoring: Secondary | ICD-10-CM | POA: Diagnosis not present

## 2018-11-29 LAB — POCT INR: INR: 2.4 (ref 2.0–3.0)

## 2018-11-29 NOTE — Patient Instructions (Addendum)
  Description   Continue on same dosage 1 tablet everyday except 1.5 tablets on Mondays and Fridays.  Recheck INR in 6-8 weeks. Call if placed on any new medications [phone] The new meds that we discussed are: Eliquis or Xarelto please let us know if you are interested in switching.

## 2018-12-31 ENCOUNTER — Other Ambulatory Visit: Payer: Self-pay

## 2018-12-31 ENCOUNTER — Telehealth (INDEPENDENT_AMBULATORY_CARE_PROVIDER_SITE_OTHER): Payer: Medicare Other | Admitting: Family Medicine

## 2018-12-31 DIAGNOSIS — K219 Gastro-esophageal reflux disease without esophagitis: Secondary | ICD-10-CM

## 2018-12-31 NOTE — Progress Notes (Signed)
[place] Telemedicine Visit  Patient consented to have virtual visit. Method of visit: Video was attempted, but technology challenges prevented patient from using video, so visit was conducted via telephone.  Encounter participants: Patient: Jesse Franklin - located at home Provider: Trenton Bing - located at home  Chief Complaint: need medication change  HPI:  Patient calling in regards to recall for Zantac which he takes with Protonix daily. His symptoms have been well controlled. Denies dysphagia, unexplained weight loss, GERD, CP. Does smoke. Says he occasionally take Pepto bismol as needed. Asking for alternative to his Zantac. Says he cannot afford medications greater than $3/month.  ROS: per HPI  Pertinent PMHx: Reviewed  Exam:  Unable to evaluate. Has clear and coherent speech.  Assessment/Plan:  GASTROESOPHAGEAL REFLUX, NO ESOPHAGITIS Chronic. Well controlled. No red flags. Smoker. On PPI. - Reassured pt does not need to take Pepcid since he is asymptomatic and advised him to consider taking Protonix 40 mg daily on a PRN basis, will remove Zantac from med list - Discussed importance of smoking cessation - Reviewed return precations    Time spent during visit with patient: 5 minutes

## 2018-12-31 NOTE — Assessment & Plan Note (Signed)
Chronic. Well controlled. No red flags. Smoker. On PPI. - Reassured pt does not need to take Pepcid since he is asymptomatic and advised him to consider taking Protonix 40 mg daily on a PRN basis, will remove Zantac from med list - Discussed importance of smoking cessation - Reviewed return precations

## 2019-01-01 ENCOUNTER — Telehealth: Payer: Self-pay | Admitting: Physician Assistant

## 2019-01-01 NOTE — Telephone Encounter (Signed)
Spoke with patient who confirmed all demographics. Patient does not have a computer or smart phone. Will have his weight for his visit. Cannot take BP.

## 2019-01-01 NOTE — Telephone Encounter (Signed)

## 2019-01-06 NOTE — Progress Notes (Signed)
Virtual Visit via Telephone Note   This visit type was conducted due to national recommendations for restrictions regarding the COVID-19 Pandemic (e.g. social distancing) in an effort to limit this patient's exposure and mitigate transmission in our community.  Due to his co-morbid illnesses, this patient is at least at moderate risk for complications without adequate follow up.  This format is felt to be most appropriate for this patient at this time.  The patient did not have access to video technology/had technical difficulties with video requiring transitioning to audio format only (telephone).  All issues noted in this document were discussed and addressed.  No physical exam could be performed with this format.  Please refer to the patient's chart for his  consent to telehealth for [facility].   Evaluation Performed:  Follow-up visit  Date:  01/07/2019   ID:  Jesse Franklin, DOB 1951/04/22, MRN [id number]  Patient Location: Home Provider Location: Home  PCP:  Jesse Rio, MD  Cardiologist:  Jesse Moores, MD  Electrophysiologist:  None   Chief Complaint:  6 months follow up  History of Present Illness:    Jesse Franklin is a 68 y.o. male with hx of CAD, HTN, HLD, atrial fibrillation and tobacco smoking seen for follow up.   She was doing well when last seen by Dr. Acie Franklin 04/2018.   Seen by PCP 12/31/18 as virtual visit. Doing well. Advised to take protonix 45m PRN for GERD.   Patient has not started taking Protonix yet and requesting refill.  He is also requesting refill of Viagra.  Patient reports intermittent nonspecific chest discomfort as well as left shoulder pain. Both are separate. He also has pain radiating to his left arm from his shoulder.  He states that the symptoms is stable for many months.  Intermittently requires to take nitroglycerin and resolves.  He has noted some exertional shortness of breath especially walking uphill however he never had chest  discomfort with this.  Also acid reflux as above for which he never started his medication.  He denies palpitation, orthopnea, PND, syncope, dizziness or lower extremity edema.  The patient does not have symptoms concerning for COVID-19 infection (fever, chills, cough, or new shortness of breath).    Past Medical History:  Diagnosis Date  . Alcohol abuse   . Anemia   . Atrial fibrillation ([place]   . BPH 11/08/2006  . CAD (coronary artery disease)    [place]  . Cocaine abuse (HCC)    Hx of  . GERD (gastroesophageal reflux disease)   . Gout   . Herpes   . Herpes genitalia 04/13/2011  . Hyperlipidemia   . Hypertension   . Myocardial infarction ([place] 2005  . RHINITIS, ALLERGIC 11/08/2006  . Tobacco abuse    Past Surgical History:  Procedure Laterality Date  . CARDIAC CATHETERIZATION  08/2004  . CARPAL TUNNEL RELEASE Right 04/04/2017   Procedure: RIGHT CARPAL TUNNEL RELEASE;  Surgeon: Jesse Koyanagi MD;  [facility]  Service: Orthopedics;  Laterality: Right;  . LACERATION REPAIR Right 03/1988   "hand"     Current Meds  Medication Sig  . allopurinol (ZYLOPRIM) 100 MG tablet Take 100 mg by mouth 2 (two) times daily.   .Marland KitchenamLODipine (NORVASC) 10 MG tablet TAKE 1 TABLET BY MOUTH EVERY DAY  . clopidogrel (PLAVIX) 75 MG tablet Take 1 tablet (75 mg total) by mouth daily.  . colchicine 0.6 MG tablet TAKE 1 TABLET BY MOUTH TWICE  A DAY  . ferrous sulfate 325 (65 FE) MG tablet TAKE 1 TABLET BY MOUTH EVERY DAY WITH BREAKFAST  . nitroGLYCERIN (NITROSTAT) 0.4 MG SL tablet Place 1 tablet (0.4 mg total) under the tongue as directed. Place 1 tab under tongue as directed  . pantoprazole (PROTONIX) 40 MG tablet Take 1 tablet (40 mg total) by mouth daily.  . rosuvastatin (CRESTOR) 40 MG tablet TAKE 1 TABLET BY MOUTH EVERY DAY  . sildenafil (VIAGRA) 50 MG tablet Take 1 tablet (50 mg total) by mouth daily as needed for erectile dysfunction.  . tamsulosin (FLOMAX) 0.4 MG CAPS  capsule TAKE 1 CAPSULE BY MOUTH EVERY DAY  . warfarin (COUMADIN) 5 MG tablet TAKE AS DIRECTED BY COUMADIN CLINIC  . [DISCONTINUED] pantoprazole (PROTONIX) 40 MG tablet TAKE 1 TABLET BY MOUTH EVERY DAY  . [DISCONTINUED] sildenafil (VIAGRA) 50 MG tablet Take 1 tablet (50 mg total) by mouth daily as needed for erectile dysfunction.     Allergies:   Codeine and Ketorolac tromethamine   Social History   Tobacco Use  . Smoking status: Current Every Day Smoker    Packs/day: 0.25    Years: 48.00    Pack years: 12.00    Types: Cigarettes  . Smokeless tobacco: Former Systems developer    Types: Chew    Quit date: 09/12/1967  . Tobacco comment: smoking 3-4 cigs/day  Substance Use Topics  . Alcohol use: No  . Drug use: Yes    Types: "Crack" cocaine, Marijuana    Comment: past history of cocaine use. Patient admits to marijuana use.      Family Hx: The patient's family history includes Alcohol abuse in his brother and father; Arthritis in his brother; Cancer in his sister; Gout in his mother; Heart disease in his brother; Hypertension in his mother and sister.  ROS:   Please see the history of present illness.    All other systems reviewed and are negative.   Prior CV studies:   The following studies were reviewed today:  Echo 05/2018 Study Conclusions  - Left ventricle: The cavity size was normal. Wall thickness was   normal. Systolic function was normal. The estimated ejection   fraction was in the range of 60% to 65%. Wall motion was normal;   there were no regional wall motion abnormalities. - Aortic valve: There was mild regurgitation. - Left atrium: The atrium was mildly dilated. - Pulmonary arteries: Systolic pressure was moderately increased.   PA peak pressure: 41 mm Hg (S).  Cath 08/2004 RESULTS:  1.  Left main:  Normal.  2.  LAD:  Large vessel, tortuous in the mid and distal section with mild      luminal irregularities.  The extreme distal portion of the LAD after the       trifurcation of the distal vessel has a subtotal occlusion at the      trifurcation tip.  Vessel less than 0.5 mm at this juncture.  3.  LXC:  Codominant with mild luminal irregularities.  4.  OM1 and OM2:  Large vessels with mild luminal irregularities.  5.  RCA:  Tortuous, codominant with mild luminal irregularities, very faint      right to left filling collaterals seen at the apex.  6.  LV:  EF 50-55% with mild inferior apical hypokinesis.  LVEDP was 35      mmHg.  7.  Abdominal/distal aortogram showed mild tortuosity.  No significant      aneurysms were seen.  The distal  aorta showed mild splaying to the left      at the area of the iliac bifurcation.  No significant iliac or femoral      disease was noted.   IMPRESSION:  1.  Distal branch vessel obstructive disease at the very distal takeoff      after the bifurcation of the left anterior descending (vessel      approximately 0.5 mm).  2.  Nonobstructive left main, left anterior descending, diagonal, left      circumflex, obtuse marginal, and right coronary artery.  3.  Preserved left ventricular systolic function, ejection fraction of 50-      55% with inferior apical hypokinesis.  4.  Elevated EDP of approximately 35 mmHg.   PLAN:  1.  Will maximize medical therapy with aspirin, Plavix, and continue      Integrilin and Lovenox for 48 hours.  Due to elevated EDP will up      titrate ACE inhibitor aggressively, continue nitroglycerin drip as well      as add diuretic.  Patient already on beta blocker with good control.  2.  To evaluate his distal aorta, will get a KUB.  Also, secondary to      chronic abdominal pain will evaluate with      KUB as well as get abdominal ultrasound to see if any significant      problems exist.  Discussed results with patient at length.  Patient      denies any recent IV drug use.  Therefore, will continue medical      treatment as listed above.   Labs/Other Tests and Data Reviewed:     EKG:  No ECG reviewed.  Recent Labs: 03/12/2018: ALT 57; BUN 9; Creatinine, Ser 1.15; Potassium 3.8; Sodium 144   Recent Lipid Panel Lab Results  Component Value Date/Time   CHOL 141 07/27/2017 12:05 PM   TRIG 156 (H) 07/27/2017 12:05 PM   HDL 38 (L) 07/27/2017 12:05 PM   CHOLHDL 3.7 07/27/2017 12:05 PM   CHOLHDL 3.4 08/02/2016 09:13 AM   LDLCALC 72 07/27/2017 12:05 PM   LDLDIRECT 95 05/28/2012 09:25 AM    Wt Readings from Last 3 Encounters:  01/07/19 179 lb (81.2 kg)  08/02/18 183 lb 3.2 oz (83.1 kg)  07/17/18 182 lb 6.4 oz (82.7 kg)     Objective:    Vital Signs:  Ht 5' 6"  (1.676 m)   Wt 179 lb (81.2 kg)   BMI 28.89 kg/m  No BP cuff at home.  VITAL SIGNS:  reviewed GEN:  no acute distress PSYCH:  normal affect  ASSESSMENT & PLAN:    1.  Chest pain with medically managed CAD -Patient has intermittent chest pain and dyspnea on exertion.  He never had any exertional chest discomfort.  His chest pain somewhat atypical however cannot completely ruled out angina.  His symptoms is stable for many months.  He is unable to quantify his symptoms further with how many times requiring sublingual nitroglycerin.  Encouraged to continue to take medications.  He will give [place] a call if worsening of symptoms.  He prefers to seen by Dr. Acie Franklin rather than APP.  2.  Hypertension -He does not have a blood pressure machine at home.  No change in therapy.  3.  Hyperlipidemia No results found for requested labs within last 8760 hours.  -Continue statin.  Consider labs at follow-up.   COVID-19 Education: The signs and symptoms of COVID-19 were discussed with the patient and  how to seek care for testing (follow up with PCP or arrange E-visit).  The importance of social distancing was discussed today.  Time:   Today, I have spent 14 minutes with the patient with telehealth technology discussing the above problems.     Medication Adjustments/Labs and Tests Ordered: Current medicines are  reviewed at length with the patient today.  Concerns regarding medicines are outlined above.   Tests Ordered: No orders of the defined types were placed in this encounter.   Medication Changes: Meds ordered this encounter  Medications  . pantoprazole (PROTONIX) 40 MG tablet    Sig: Take 1 tablet (40 mg total) by mouth daily.    Dispense:  30 tablet    Refill:  2  . sildenafil (VIAGRA) 50 MG tablet    Sig: Take 1 tablet (50 mg total) by mouth daily as needed for erectile dysfunction.    Dispense:  10 tablet    Refill:  1    Note change in dose    Disposition:  Follow up in 3 month(s)  Signed, Leanor Kail, PA  01/07/2019 11:50 AM    [facility]

## 2019-01-07 ENCOUNTER — Telehealth (INDEPENDENT_AMBULATORY_CARE_PROVIDER_SITE_OTHER): Payer: Medicare Other | Admitting: Physician Assistant

## 2019-01-07 ENCOUNTER — Telehealth: Payer: Medicare Other | Admitting: Physician Assistant

## 2019-01-07 ENCOUNTER — Encounter: Payer: Self-pay | Admitting: Physician Assistant

## 2019-01-07 VITALS — Ht 66.0 in | Wt 179.0 lb

## 2019-01-07 DIAGNOSIS — I25110 Atherosclerotic heart disease of native coronary artery with unstable angina pectoris: Secondary | ICD-10-CM | POA: Diagnosis not present

## 2019-01-07 DIAGNOSIS — I4891 Unspecified atrial fibrillation: Secondary | ICD-10-CM

## 2019-01-07 DIAGNOSIS — I10 Essential (primary) hypertension: Secondary | ICD-10-CM

## 2019-01-07 DIAGNOSIS — E782 Mixed hyperlipidemia: Secondary | ICD-10-CM

## 2019-01-07 MED ORDER — PANTOPRAZOLE SODIUM 40 MG PO TBEC: 40.0000 mg | DELAYED_RELEASE_TABLET | Freq: Every day | ORAL | 2 refills | Status: DC

## 2019-01-07 MED ORDER — SILDENAFIL CITRATE 50 MG PO TABS: 50.0000 mg | ORAL_TABLET | Freq: Every day | ORAL | 1 refills | Status: DC | PRN

## 2019-01-07 NOTE — Patient Instructions (Signed)
Medication Instructions:  Your physician recommends that you continue on your current medications as directed. Please refer to the Current Medication list given to you today.  If you need a refill on your cardiac medications before your next appointment, please call your pharmacy.   Lab work: NONE If you have labs (blood work) drawn today and your tests are completely normal, you will receive your results only by: Marland Kitchen MyChart Message (if you have MyChart) OR . A paper copy in the mail If you have any lab test that is abnormal or we need to change your treatment, we will call you to review the results.  Testing/Procedures: NONE  Follow-Up: At [facility], you and your health needs are our priority.  As part of our continuing mission to provide you with exceptional heart care, we have created designated Provider Care Teams.  These Care Teams include your primary Cardiologist (physician) and Advanced Practice Providers (APPs -  Physician Assistants and Nurse Practitioners) who all work together to provide you with the care you need, when you need it. You will need a follow up appointment in:  3-4 months.  Please call our office 2 months in advance to schedule this appointment.  You may see Mertie Moores, MD or one of the following Advanced Practice Providers on your designated Care Team: Richardson Dopp, PA-C [place], [place] . Daune Perch, NP  Any Other Special Instructions Will Be Listed Below (If Applicable).

## 2019-01-20 ENCOUNTER — Other Ambulatory Visit: Payer: Self-pay | Admitting: Physician Assistant

## 2019-01-23 ENCOUNTER — Telehealth: Payer: Self-pay

## 2019-01-23 NOTE — Telephone Encounter (Signed)

## 2019-01-24 ENCOUNTER — Other Ambulatory Visit: Payer: Self-pay

## 2019-01-24 ENCOUNTER — Ambulatory Visit (INDEPENDENT_AMBULATORY_CARE_PROVIDER_SITE_OTHER): Payer: Medicare Other | Admitting: *Deleted

## 2019-01-24 DIAGNOSIS — I4891 Unspecified atrial fibrillation: Secondary | ICD-10-CM | POA: Diagnosis not present

## 2019-01-24 DIAGNOSIS — Z5181 Encounter for therapeutic drug level monitoring: Secondary | ICD-10-CM | POA: Diagnosis not present

## 2019-01-24 LAB — POCT INR: INR: 2.6 (ref 2.0–3.0)

## 2019-01-28 ENCOUNTER — Other Ambulatory Visit: Payer: Self-pay | Admitting: Cardiovascular Disease

## 2019-01-28 DIAGNOSIS — Z79899 Other long term (current) drug therapy: Secondary | ICD-10-CM | POA: Diagnosis not present

## 2019-01-28 DIAGNOSIS — M1A09X1 Idiopathic chronic gout, multiple sites, with tophus (tophi): Secondary | ICD-10-CM | POA: Diagnosis not present

## 2019-01-28 MED ORDER — PANTOPRAZOLE SODIUM 40 MG PO TBEC: 40.0000 mg | DELAYED_RELEASE_TABLET | Freq: Every day | ORAL | 2 refills | Status: DC

## 2019-01-30 ENCOUNTER — Other Ambulatory Visit: Payer: Self-pay | Admitting: Family Medicine

## 2019-02-18 ENCOUNTER — Encounter: Payer: Self-pay | Admitting: Family Medicine

## 2019-02-27 DIAGNOSIS — M2550 Pain in unspecified joint: Secondary | ICD-10-CM | POA: Diagnosis not present

## 2019-02-27 DIAGNOSIS — Z79899 Other long term (current) drug therapy: Secondary | ICD-10-CM | POA: Diagnosis not present

## 2019-02-27 DIAGNOSIS — M1A09X1 Idiopathic chronic gout, multiple sites, with tophus (tophi): Secondary | ICD-10-CM | POA: Diagnosis not present

## 2019-03-17 ENCOUNTER — Telehealth: Payer: Self-pay

## 2019-03-17 NOTE — Telephone Encounter (Signed)

## 2019-03-21 ENCOUNTER — Encounter (INDEPENDENT_AMBULATORY_CARE_PROVIDER_SITE_OTHER): Payer: Self-pay

## 2019-03-21 ENCOUNTER — Ambulatory Visit (INDEPENDENT_AMBULATORY_CARE_PROVIDER_SITE_OTHER): Payer: Medicare Other | Admitting: *Deleted

## 2019-03-21 ENCOUNTER — Other Ambulatory Visit: Payer: Self-pay

## 2019-03-21 DIAGNOSIS — Z5181 Encounter for therapeutic drug level monitoring: Secondary | ICD-10-CM | POA: Diagnosis not present

## 2019-03-21 DIAGNOSIS — I4891 Unspecified atrial fibrillation: Secondary | ICD-10-CM | POA: Diagnosis not present

## 2019-03-21 LAB — POCT INR: INR: 2.3 (ref 2.0–3.0)

## 2019-03-21 NOTE — Patient Instructions (Signed)
Description   Continue on same dosage 1 tablet everyday except 1.5 tablets on Mondays and Fridays.  Recheck INR in 8 weeks. Call if placed on any new medications 336 [phone].

## 2019-03-24 DIAGNOSIS — K7581 Nonalcoholic steatohepatitis (NASH): Secondary | ICD-10-CM | POA: Diagnosis not present

## 2019-03-24 DIAGNOSIS — K7689 Other specified diseases of liver: Secondary | ICD-10-CM | POA: Diagnosis not present

## 2019-03-26 ENCOUNTER — Other Ambulatory Visit: Payer: Self-pay | Admitting: Nurse Practitioner

## 2019-03-26 DIAGNOSIS — K7581 Nonalcoholic steatohepatitis (NASH): Secondary | ICD-10-CM

## 2019-04-12 ENCOUNTER — Other Ambulatory Visit: Payer: Self-pay | Admitting: Cardiovascular Disease

## 2019-04-15 ENCOUNTER — Other Ambulatory Visit: Payer: Self-pay | Admitting: Family Medicine

## 2019-04-15 ENCOUNTER — Other Ambulatory Visit: Payer: Self-pay | Admitting: Cardiovascular Disease

## 2019-04-15 ENCOUNTER — Telehealth: Payer: Self-pay | Admitting: Family Medicine

## 2019-04-15 NOTE — Telephone Encounter (Signed)
What did pt need a refill on? Riordan Walle Kennon Holter, CMA

## 2019-04-15 NOTE — Telephone Encounter (Signed)
Pt called to get a refill on his

## 2019-04-16 NOTE — Telephone Encounter (Signed)
Please ask patient to schedule an office visit to follow up with me Thanks Leeanne Rio, MD

## 2019-04-16 NOTE — Telephone Encounter (Signed)
Pt scheduled and informed of appt. Ana Liaw Kennon Holter, CMA

## 2019-04-17 ENCOUNTER — Ambulatory Visit
Admission: RE | Admit: 2019-04-17 | Discharge: 2019-04-17 | Disposition: A | Discharge status: Home / Self Care | Payer: Medicare Other | Source: Ambulatory Visit | Attending: Nurse Practitioner | Admitting: Nurse Practitioner

## 2019-04-17 ENCOUNTER — Other Ambulatory Visit: Payer: Self-pay

## 2019-04-17 DIAGNOSIS — K7581 Nonalcoholic steatohepatitis (NASH): Secondary | ICD-10-CM

## 2019-04-17 MED ORDER — GADOXETATE DISODIUM 0.25 MMOL/ML IV SOLN
8.0000 mL | Freq: Once | INTRAVENOUS | Status: AC | PRN
  Administered 2019-04-17: 8 mL via INTRAVENOUS

## 2019-04-19 ENCOUNTER — Other Ambulatory Visit: Payer: Self-pay | Admitting: Cardiovascular Disease

## 2019-04-24 ENCOUNTER — Other Ambulatory Visit: Payer: Self-pay | Admitting: Family Medicine

## 2019-04-24 ENCOUNTER — Other Ambulatory Visit: Payer: Self-pay | Admitting: Cardiovascular Disease

## 2019-04-24 NOTE — Telephone Encounter (Signed)
New Message     *STAT* If patient is at the pharmacy, call can be transferred to refill team.   1. Which medications need to be refilled? (please list name of each medication and dose if known) Clopidogrel 3m 1 tablet by mouth daily  2. Which pharmacy/location (including street and city if local pharmacy) is medication to be sent to?  [facility] [address]  3. Do they need a 30 day or 90 day supply? 90 day supply

## 2019-04-28 ENCOUNTER — Other Ambulatory Visit: Payer: Self-pay | Admitting: Cardiovascular Disease

## 2019-04-28 MED ORDER — CLOPIDOGREL BISULFATE 75 MG PO TABS: 75.0000 mg | ORAL_TABLET | Freq: Every day | ORAL | 1 refills | Status: DC

## 2019-04-28 NOTE — Telephone Encounter (Signed)
Let patient know that I refilled his Plavix 75 MG daily.

## 2019-04-28 NOTE — Addendum Note (Signed)
Addended by: Venetia Maxon on: 04/28/2019 01:21 PM   Modules accepted: Orders

## 2019-04-28 NOTE — Telephone Encounter (Signed)
Follow up     *STAT* If patient is at the pharmacy, call can be transferred to refill team.   1. Which medications need to be refilled? (please list name of each medication and dose if known) clopidogrel (PLAVIX) 75 MG tablet   2. Which pharmacy/location (including street and city if local pharmacy) is medication to be sent to? [id number] [place] [place] - [address] AT [address]  3. Do they need a 30 day or 90 day supply? [place]

## 2019-04-29 MED ORDER — CLOPIDOGREL BISULFATE 75 MG PO TABS: 75.0000 mg | ORAL_TABLET | Freq: Every day | ORAL | 2 refills | Status: DC

## 2019-04-29 NOTE — Addendum Note (Signed)
Addended by: Derl Barrow on: 04/29/2019 02:46 PM   Modules accepted: Orders

## 2019-05-02 ENCOUNTER — Other Ambulatory Visit: Payer: Self-pay

## 2019-05-02 ENCOUNTER — Ambulatory Visit (INDEPENDENT_AMBULATORY_CARE_PROVIDER_SITE_OTHER): Payer: Medicare Other | Admitting: Family Medicine

## 2019-05-02 ENCOUNTER — Encounter: Payer: Self-pay | Admitting: Family Medicine

## 2019-05-02 VITALS — BP 120/72 | HR 47

## 2019-05-02 DIAGNOSIS — E7800 Pure hypercholesterolemia, unspecified: Secondary | ICD-10-CM | POA: Diagnosis not present

## 2019-05-02 DIAGNOSIS — I10 Essential (primary) hypertension: Secondary | ICD-10-CM

## 2019-05-02 DIAGNOSIS — N401 Enlarged prostate with lower urinary tract symptoms: Secondary | ICD-10-CM

## 2019-05-02 DIAGNOSIS — D509 Iron deficiency anemia, unspecified: Secondary | ICD-10-CM | POA: Diagnosis not present

## 2019-05-02 DIAGNOSIS — K219 Gastro-esophageal reflux disease without esophagitis: Secondary | ICD-10-CM | POA: Diagnosis not present

## 2019-05-02 DIAGNOSIS — R351 Nocturia: Secondary | ICD-10-CM

## 2019-05-02 MED ORDER — FAMOTIDINE 20 MG PO TABS: 20.0000 mg | ORAL_TABLET | Freq: Two times a day (BID) | ORAL | 1 refills | Status: DC

## 2019-05-02 NOTE — Assessment & Plan Note (Signed)
Doing well on flomax 0.26m daily, continue this medication.

## 2019-05-02 NOTE — Assessment & Plan Note (Signed)
Uncontrolled. Add pepcid 20m twice daily. Follow up in 1 month to eval for improvement. Given symptoms of food getting stuck, if not better with pepcid on top of protonix, will refer back to GI as may need EGD to evaluate for stricture.

## 2019-05-02 NOTE — Progress Notes (Signed)
Date of Visit: 05/02/2019   HPI:  Patient presents for routine follow up.  GERD - taking pantoprazole 16m daily. Has symptoms of food getting stuck in throat, also of burning after eating. Feels like symptoms were better controlled before he had to be switched to protonix due to an interaction with his other medications.   Urine - taking flomax 0.479mdaily. Urinating better and less frequently with this medication.   Hyperlipidemia - due for labwork but is not fasting today. Taking rosuvastatin 4038maily.   Request for letter - needs a letter saying that his dog is a therapeutic presence to him so that his landlord will allow him to keep the dog. Has had her for 3 years and notices himself feeling calmer and less stressed with the dog in his life.   Anemia - taking iron daily. Due for stool test which he gets yearly at the recommendation of his GI doctor.  ROS: See HPI.  PMFTennysonistory of GERD, afib, depression, BPH  PHYSICAL EXAM: BP 120/72   Pulse (!) 47   SpO2 99%  Gen: no acute distress, pleasant, cooperative, well appearing HEENT: normocephalic, atraumatic  Heart: irreg irreg, no murmur Lungs: clear to auscultation bilaterally, normal work of breathing   Neuro: alert, speech normal Ext: No appreciable lower extremity edema bilaterally  Abdomen; soft, nontender to palpation   ASSESSMENT/PLAN:  Anemia, iron deficiency Check labs (cbc, iron, TIBC, ferritin) at upcoming lab visit. Patient will bring in stool sample for FIT testing.  BPH (benign prostatic hyperplasia) Doing well on flomax 0.4mg75mily, continue this medication.   GASTROESOPHAGEAL REFLUX, NO ESOPHAGITIS Uncontrolled. Add pepcid 20mg20mce daily. Follow up in 1 month to eval for improvement. Given symptoms of food getting stuck, if not better with pepcid on top of protonix, will refer back to GI as may need EGD to evaluate for stricture.  HYPERCHOLESTEROLEMIA Fasting lab visit scheduled since patient is not  fasting today.   Letter written that patient's dog is a therapeutic presence to him.  FOLLOW UP: Follow up in 1 mo for GERD  BrittTanzaniacIntArdelia MemsCoLevelland

## 2019-05-02 NOTE — Assessment & Plan Note (Signed)
Fasting lab visit scheduled since patient is not fasting today.

## 2019-05-02 NOTE — Assessment & Plan Note (Signed)
Check labs (cbc, iron, TIBC, ferritin) at upcoming lab visit. Patient will bring in stool sample for FIT testing.

## 2019-05-02 NOTE — Patient Instructions (Addendum)
It was great to see you again today!  Stay on current medications Add pepcid twice a day for acid reflux Follow up with me as scheduled for virtual visit in Sept for the acid reflux  Come back next Friday AM for fasting labs. Bring stool card with you to that visit.  Be well, Dr. Ardelia Mems

## 2019-05-08 ENCOUNTER — Other Ambulatory Visit: Payer: Medicare Other

## 2019-05-08 ENCOUNTER — Other Ambulatory Visit: Payer: Self-pay

## 2019-05-08 DIAGNOSIS — I10 Essential (primary) hypertension: Secondary | ICD-10-CM | POA: Diagnosis not present

## 2019-05-08 DIAGNOSIS — D509 Iron deficiency anemia, unspecified: Secondary | ICD-10-CM | POA: Diagnosis not present

## 2019-05-09 ENCOUNTER — Other Ambulatory Visit: Payer: Medicare Other

## 2019-05-09 LAB — CMP14+EGFR
ALT: 44 IU/L (ref 0–44)
AST: 39 IU/L (ref 0–40)
Albumin/Globulin Ratio: 1.6 (ref 1.2–2.2)
Albumin: 4.6 g/dL (ref 3.8–4.8)
Alkaline Phosphatase: 88 IU/L (ref 39–117)
BUN/Creatinine Ratio: 8 — ABNORMAL LOW (ref 10–24)
BUN: 9 mg/dL (ref 8–27)
Bilirubin Total: 0.5 mg/dL (ref 0.0–1.2)
CO2: 20 mmol/L (ref 20–29)
Calcium: 9.7 mg/dL (ref 8.6–10.2)
Chloride: 106 mmol/L (ref 96–106)
Creatinine, Ser: 1.18 mg/dL (ref 0.76–1.27)
GFR calc Af Amer: 74 mL/min/{1.73_m2} (ref >59)
GFR calc non Af Amer: 64 mL/min/{1.73_m2} (ref >59)
Globulin, Total: 2.8 g/dL (ref 1.5–4.5)
Glucose: 90 mg/dL (ref 65–99)
Potassium: 4 mmol/L (ref 3.5–5.2)
Sodium: 143 mmol/L (ref 134–144)
Total Protein: 7.4 g/dL (ref 6.0–8.5)

## 2019-05-09 LAB — LIPID PANEL
Chol/HDL Ratio: 3.2 ratio (ref 0.0–5.0)
Cholesterol, Total: 125 mg/dL (ref 100–199)
HDL: 39 mg/dL — ABNORMAL LOW (ref >39)
LDL Calculated: 65 mg/dL (ref 0–99)
Triglycerides: 105 mg/dL (ref 0–149)
VLDL Cholesterol Cal: 21 mg/dL (ref 5–40)

## 2019-05-09 LAB — CBC
Hematocrit: 38.5 % (ref 37.5–51.0)
Hemoglobin: 13 g/dL (ref 13.0–17.7)
MCH: 32.2 pg (ref 26.6–33.0)
MCHC: 33.8 g/dL (ref 31.5–35.7)
MCV: 95 fL (ref 79–97)
Platelets: 145 10*3/uL — ABNORMAL LOW (ref 150–450)
RBC: 4.04 x10E6/uL — ABNORMAL LOW (ref 4.14–5.80)
RDW: 13.8 % (ref 11.6–15.4)
WBC: 2.9 10*3/uL — ABNORMAL LOW (ref 3.4–10.8)

## 2019-05-09 LAB — FERRITIN: Ferritin: 366 ng/mL (ref 30–400)

## 2019-05-09 LAB — IRON AND TIBC
Iron Saturation: 32 % (ref 15–55)
Iron: 88 ug/dL (ref 38–169)
Total Iron Binding Capacity: 273 ug/dL (ref 250–450)
UIBC: 185 ug/dL (ref 111–343)

## 2019-05-10 ENCOUNTER — Encounter: Payer: Self-pay | Admitting: Family Medicine

## 2019-05-10 LAB — FECAL OCCULT BLOOD, IMMUNOCHEMICAL: Fecal Occult Bld: NEGATIVE

## 2019-05-14 ENCOUNTER — Encounter: Payer: Self-pay | Admitting: Family Medicine

## 2019-05-16 ENCOUNTER — Ambulatory Visit (INDEPENDENT_AMBULATORY_CARE_PROVIDER_SITE_OTHER): Payer: Medicare Other | Admitting: *Deleted

## 2019-05-16 ENCOUNTER — Other Ambulatory Visit: Payer: Self-pay

## 2019-05-16 DIAGNOSIS — Z5181 Encounter for therapeutic drug level monitoring: Secondary | ICD-10-CM | POA: Diagnosis not present

## 2019-05-16 DIAGNOSIS — I4891 Unspecified atrial fibrillation: Secondary | ICD-10-CM | POA: Diagnosis not present

## 2019-05-16 LAB — POCT INR: INR: 2.2 (ref 2.0–3.0)

## 2019-05-16 NOTE — Patient Instructions (Signed)
Description   Continue on same dosage 1 tablet everyday except 1.5 tablets on Mondays and Fridays.  Recheck INR in 8 weeks. Call if placed on any new medications [phone].

## 2019-05-23 ENCOUNTER — Encounter: Payer: Self-pay | Admitting: Cardiology

## 2019-05-23 ENCOUNTER — Other Ambulatory Visit: Payer: Self-pay

## 2019-05-23 ENCOUNTER — Ambulatory Visit (INDEPENDENT_AMBULATORY_CARE_PROVIDER_SITE_OTHER): Payer: Medicare Other | Admitting: Cardiology

## 2019-05-23 VITALS — BP 120/70 | HR 45 | Ht 66.0 in | Wt 180.8 lb

## 2019-05-23 DIAGNOSIS — I4821 Permanent atrial fibrillation: Secondary | ICD-10-CM | POA: Diagnosis not present

## 2019-05-23 DIAGNOSIS — E785 Hyperlipidemia, unspecified: Secondary | ICD-10-CM

## 2019-05-23 DIAGNOSIS — I10 Essential (primary) hypertension: Secondary | ICD-10-CM

## 2019-05-23 DIAGNOSIS — F17200 Nicotine dependence, unspecified, uncomplicated: Secondary | ICD-10-CM

## 2019-05-23 DIAGNOSIS — I25118 Atherosclerotic heart disease of native coronary artery with other forms of angina pectoris: Secondary | ICD-10-CM

## 2019-05-23 NOTE — Patient Instructions (Addendum)
Medication Instructions:  Your physician recommends that you continue on your current medications as directed. Please refer to the Current Medication list given to you today.  If you need a refill on your cardiac medications before your next appointment, please call your pharmacy.   Lab work: None   If you have labs (blood work) drawn today and your tests are completely normal, you will receive your results only by: Marland Kitchen MyChart Message (if you have MyChart) OR . A paper copy in the mail If you have any lab test that is abnormal or we need to change your treatment, we will call you to review the results.  Testing/Procedures: None   Follow-Up: At [facility], you and your health needs are our priority.  As part of our continuing mission to provide you with exceptional heart care, we have created designated Provider Care Teams.  These Care Teams include your primary Cardiologist (physician) and Advanced Practice Providers (APPs -  Physician Assistants and Nurse Practitioners) who all work together to provide you with the care you need, when you need it. You will need a follow up appointment in:  6 months.  Please call our office 2 months in advance to schedule this appointment.  You may see Mertie Moores, MD or one of the following Advanced Practice Providers on your designated Care Team: Richardson Dopp, PA-C [place], [place] . Daune Perch, NP  Any Other Special Instructions Will Be Listed Below (If Applicable).  Lifestyle Modifications to Prevent and Treat Heart Disease -Recommend heart healthy/Mediterranean diet, with whole grains, fruits, vegetables, fish, lean meats, nuts, olive oil and avocado oil.  -Limit salt intake to less than 1500 mg per day.  -Recommend moderate walking, starting slowly with a few minutes and working up to 3-5 times/week for 30-50 minutes each session. Aim for at least 150 minutes.week. Goal should be pace of 3 miles/hours, or walking 1.5 miles in 30  minutes -Recommend avoidance of tobacco products. Avoid excess alcohol. -Keep blood pressure well controlled, ideally less than 130/80.

## 2019-05-23 NOTE — Progress Notes (Signed)
Cardiology Office Note:    Date:  05/23/2019   ID:  Jesse Franklin, DOB Sep 10, 1951, MRN [id number]  PCP:  Leeanne Rio, MD  Cardiologist:  Mertie Moores, MD  Referring MD: Leeanne Rio, MD   Chief Complaint  Patient presents with  . Follow-up  . Coronary Artery Disease  . Chest Pain    History of Present Illness:    Jesse Franklin is a 68 y.o. male with a past medical history significant for CAD, hypertension, hyperlipidemia, atrial fibrillation and tobacco abuse.  Patient has known coronary artery disease with distal LAD subtotal occlusion by heart cath in 2005.  Patient was last seen by telemedicine on 01/07/2019 by Robbie Lis, PA at which time he was having some nonspecific chest discomfort and left shoulder pain which had been stable for many months.  He was intermittently taking nitroglycerin which helped.  He also had some shortness of breath with walking uphill but no chest pain with exertion and was having symptoms of acid reflux.  He was recently seen by his PCP noted to have GERD uncontrolled on Protonix.  Also difficulty swallowing with food getting stuck.  Pepcid 20 mg twice daily was added.  Planned referral back to GI if no improvement on addition of H2 blocker, possible need for EGD.  [name] is here today for follow-up.  He lives alone and does some light housework.  He walks his dog, ~15-20 minutes. No lightheadedness, he occ has blurred vision. No syncope. He gets a little tingling in his left chest "like when you bump your funny bone", occasionally, about once a month. No substernal chest pain. No shortness of breath except with more intense exertion like going up 15 steps. No orthopnea, PND or edema. No leg pain with walking.   He says that pepcid has helped "a whole lot". He is not geitting indigestion like he was.   He still smokes 4-5 cigarettes per day.   Past Medical History:  Diagnosis Date  . Alcohol abuse   . Anemia   . Atrial  fibrillation ([place])   . BPH 11/08/2006  . CAD (coronary artery disease)    [place]  . Cocaine abuse ([facility])    Hx of  . GERD (gastroesophageal reflux disease)   . Gout   . Herpes   . Herpes genitalia 04/13/2011  . Hyperlipidemia   . Hypertension   . Myocardial infarction ([place]) 2005  . RHINITIS, ALLERGIC 11/08/2006  . Tobacco abuse     Past Surgical History:  Procedure Laterality Date  . CARDIAC CATHETERIZATION  08/2004  . CARPAL TUNNEL RELEASE Right 04/04/2017   Procedure: RIGHT CARPAL TUNNEL RELEASE;  Surgeon: Leandrew Koyanagi, MD;  Location: [facility];  Service: Orthopedics;  Laterality: Right;  . LACERATION REPAIR Right 03/1988   "hand"    Current Medications: Current Meds  Medication Sig  . allopurinol (ZYLOPRIM) 100 MG tablet Take 100 mg by mouth 2 (two) times daily.   Marland Kitchen amLODipine (NORVASC) 10 MG tablet TAKE 1 TABLET BY MOUTH EVERY DAY  . clopidogrel (PLAVIX) 75 MG tablet Take 1 tablet (75 mg total) by mouth daily.  . colchicine 0.6 MG tablet TAKE 1 TABLET BY MOUTH TWICE A DAY  . famotidine (PEPCID) 20 MG tablet Take 1 tablet (20 mg total) by mouth 2 (two) times daily.  . ferrous sulfate 325 (65 FE) MG tablet TAKE 1 TABLET BY MOUTH EVERY DAY WITH BREAKFAST  . nitroGLYCERIN (NITROSTAT) 0.4 MG SL  tablet Place 1 tablet (0.4 mg total) under the tongue as directed. Place 1 tab under tongue as directed  . pantoprazole (PROTONIX) 40 MG tablet Take 1 tablet (40 mg total) by mouth daily. Please make yearly appt with Dr. Acie Fredrickson for August for future refills. 1st attempt  . rosuvastatin (CRESTOR) 40 MG tablet TAKE 1 TABLET BY MOUTH EVERY DAY  . sildenafil (VIAGRA) 50 MG tablet Take 1 tablet (50 mg total) by mouth daily as needed for erectile dysfunction.  . tamsulosin (FLOMAX) 0.4 MG CAPS capsule TAKE 1 CAPSULE BY MOUTH EVERY DAY  . warfarin (COUMADIN) 5 MG tablet TAKE AS DIRECTED BY COUMADIN CLINIC     Allergies:   Codeine and Ketorolac tromethamine   Social History    Socioeconomic History  . Marital status: Divorced    Spouse name: Not on file  . Number of children: 0  . Years of education: 8  . Highest education level: 8th grade  Occupational History  . Occupation: retired- Editor, commissioning: UNEMPLOYED  . Occupation: motel work  Scientific laboratory technician  . Financial resource strain: Not hard at all  . Food insecurity    Worry: Never true    Inability: Never true  . Transportation needs    Medical: No    Non-medical: No  Tobacco Use  . Smoking status: Current Every Day Smoker    Packs/day: 0.25    Years: 48.00    Pack years: 12.00    Types: Cigarettes  . Smokeless tobacco: Former Systems developer    Types: Chew    Quit date: 09/12/1967  . Tobacco comment: smoking 3-4 cigs/day  Substance and Sexual Activity  . Alcohol use: No  . Drug use: Yes    Types: "Crack" cocaine, Marijuana    Comment: past history of cocaine use. Patient admits to marijuana use.   Marland Kitchen Sexual activity: Yes    Birth control/protection: Condom  Lifestyle  . Physical activity    Days per week: 7 days    Minutes per session: 30 min  . Stress: Not at all  Relationships  . Social Herbalist on phone: Once a week    Gets together: Once a week    Attends religious service: Never    Active member of club or organization: No    Attends meetings of clubs or organizations: Never    Relationship status: Divorced  Other Topics Concern  . Not on file  Social History Narrative   Who lives with you: self and dog, mini doberman named Grandma   Lives in apartment on ground floor. Apt has handrail on stair. Has smoke alarm in apartment. No throw rugs. No grab bars in bathroom.   Diet: Pt has a varied diet. Avoids red meat, due to gout. Eats chicken, veggies, fruit.    Exercise: Pt reports walking 20/30 minutes almost every day walking dog.   Seatbelts: Pt reports wearing seatbelt when in vehicles.    Hobbies: walking, watching tv, go to park with dog. Avoids crowds.     Smokes marijuana "keeps me from hearing voices". No alcohol. Daily cigarette smoker. Does not want to quit.   Sexually active, condoms given for STI protection.           Family History: The patient's family history includes Alcohol abuse in his brother and father; Arthritis in his brother; Cancer in his sister; Gout in his mother; Heart disease in his brother; Hypertension in his mother and sister. ROS:  Please see the history of present illness.     All other systems reviewed and are negative.  EKGs/Labs/Other Studies Reviewed:    The following studies were reviewed today:  Echo 05/2018 Study Conclusions - Left ventricle: The cavity size was normal. Wall thickness was normal. Systolic function was normal. The estimated ejection fraction was in the range of 60% to 65%. Wall motion was normal; there were no regional wall motion abnormalities. - Aortic valve: There was mild regurgitation. - Left atrium: The atrium was mildly dilated. - Pulmonary arteries: Systolic pressure was moderately increased. PA peak pressure: 41 mm Hg (S).  Cath 08/2004 RESULTS: 1. Left main: Normal. 2. LAD: Large vessel, tortuous in the mid and distal section with mild luminal irregularities. The extreme distal portion of the LAD after the trifurcation of the distal vessel has a subtotal occlusion at the trifurcation tip. Vessel less than 0.5 mm at this juncture. 3. LXC: Codominant with mild luminal irregularities. 4. OM1 and OM2: Large vessels with mild luminal irregularities. 5. RCA: Tortuous, codominant with mild luminal irregularities, very faint right to left filling collaterals seen at the apex. 6. LV: EF 50-55% with mild inferior apical hypokinesis. LVEDP was 35 mmHg. 7. Abdominal/distal aortogram showed mild tortuosity. No significant aneurysms were seen. The distal aorta showed mild splaying to the left at the area of the  iliac bifurcation. No significant iliac or femoral disease was noted.  IMPRESSION: 1. Distal branch vessel obstructive disease at the very distal takeoff after the bifurcation of the left anterior descending (vessel approximately 0.5 mm). 2. Nonobstructive left main, left anterior descending, diagonal, left circumflex, obtuse marginal, and right coronary artery. 3. Preserved left ventricular systolic function, ejection fraction of 50- 55% with inferior apical hypokinesis. 4. Elevated EDP of approximately 35 mmHg.  PLAN: 1. Will maximize medical therapy with aspirin, Plavix, and continue Integrilin and Lovenox for 48 hours. Due to elevated EDP will up titrate ACE inhibitor aggressively, continue nitroglycerin drip as well as add diuretic. Patient already on beta blocker with good control. 2. To evaluate his distal aorta, will get a KUB. Also, secondary to chronic abdominal pain will evaluate with KUB as well as get abdominal ultrasound to see if any significant problems exist. Discussed results with patient at length. Patient denies any recent IV drug use. Therefore, will continue medical treatment as listed above.   EKG:  EKG is ordered today.  The ekg ordered today demonstrates junctional rhythm, 45 bpm, LVH with repolarization abnormality.  No change from previous.  Recent Labs: 05/08/2019: ALT 44; BUN 9; Creatinine, Ser 1.18; Hemoglobin 13.0; Platelets 145; Potassium 4.0; Sodium 143   Recent Lipid Panel    Component Value Date/Time   CHOL 125 05/08/2019 1012   TRIG 105 05/08/2019 1012   HDL 39 (L) 05/08/2019 1012   CHOLHDL 3.2 05/08/2019 1012   CHOLHDL 3.4 08/02/2016 0913   VLDL 26 08/02/2016 0913   LDLCALC 65 05/08/2019 1012   LDLDIRECT 95 05/28/2012 0925    Physical Exam:    VS:  BP 120/70   Pulse (!) 45   Ht 5' 6"  (1.676 m)   Wt 180 lb 12.8 oz (82 kg)   SpO2 98%   BMI 29.18  kg/m     Wt Readings from Last 3 Encounters:  05/23/19 180 lb 12.8 oz (82 kg)  01/07/19 179 lb (81.2 kg)  08/02/18 183 lb 3.2 oz (83.1 kg)     Physical Exam  Constitutional: He is oriented to person, place, and  time. He appears well-developed and well-nourished. No distress.  HENT:  Head: Normocephalic and atraumatic.  Neck: Normal range of motion. Neck supple. No JVD present.  Cardiovascular: Regular rhythm, normal heart sounds and intact distal pulses. Bradycardia present. Exam reveals no gallop and no friction rub.  No murmur heard. Pulmonary/Chest: Effort normal and breath sounds normal. No respiratory distress. He has no wheezes. He has no rales.  Abdominal: Soft. Bowel sounds are normal.  Musculoskeletal: Normal range of motion.        General: No deformity or edema.  Neurological: He is alert and oriented to person, place, and time.  Skin: Skin is warm and dry.  Psychiatric: He has a normal mood and affect. His behavior is normal. Judgment and thought content normal.     ASSESSMENT:    1. Coronary artery disease of native artery of native heart with stable angina pectoris ([place])   2. Permanent atrial fibrillation   3. Essential (primary) hypertension   4. Hyperlipidemia, unspecified hyperlipidemia type   5. TOBACCO DEPENDENCE    PLAN:    In order of problems listed above:  CAD with stable angina -Patient with nonspecific, nonexertional, atypical chest pain, possibly GI related.  Symptoms much improved with the addition of Pepcid -Medical therapy includes Plavix and high intensity statin.  Not on beta-blocker due to bradycardia. -Currently with no exertional chest pain and no significant exertional shortness of breath.  He seems to be doing very well. -Continue current therapy  GERD -Per PCP office note recently uncontrolled on Protonix.  Pepcid 20 mg twice daily was added.  Planned referral back to GI if no improvement on addition of H2 blocker.  Today patient says  he is doing much better on the Pepcid.  Permanent atrial fibrillation -Rate controlled, junctional rhythm in the 40's. Tolerating it well. Not on any rate lowering medications  -On Coumadin for stroke risk reduction.  Recent labs with normal hemoglobin  Hypertension -On Norvasc 10 mg daily -Recent labs on 05/08/2019 showed normal potassium of 4.0.  Creatinine 1.18 and estimated GFR of 74. -Blood pressure well controlled today -Continue current therapy  Hyperlipidemia -On rosuvastatin 40 mg daily -Lipid panel on 05/08/2019 showed LDL of 65.  Good control.  Continue current therapy.  Tobacco abuse -Continues to smoke 4-5 cigarettes/day.  Patient strongly advised on complete cessation to reduce his future cardiovascular risk.  He says that he understands but it is hard to put them down.  Encouragement provided.  Medication Adjustments/Labs and Tests Ordered: Current medicines are reviewed at length with the patient today.  Concerns regarding medicines are outlined above. Labs and tests ordered and medication changes are outlined in the patient instructions below:  Patient Instructions  Medication Instructions:  Your physician recommends that you continue on your current medications as directed. Please refer to the Current Medication list given to you today.  If you need a refill on your cardiac medications before your next appointment, please call your pharmacy.   Lab work: None   If you have labs (blood work) drawn today and your tests are completely normal, you will receive your results only by: Marland Kitchen MyChart Message (if you have MyChart) OR . A paper copy in the mail If you have any lab test that is abnormal or we need to change your treatment, we will call you to review the results.  Testing/Procedures: None   Follow-Up: At [facility], you and your health needs are our priority.  As part of our continuing mission  to provide you with exceptional heart care, we have created  designated Provider Care Teams.  These Care Teams include your primary Cardiologist (physician) and Advanced Practice Providers (APPs -  Physician Assistants and Nurse Practitioners) who all work together to provide you with the care you need, when you need it. You will need a follow up appointment in:  6 months.  Please call our office 2 months in advance to schedule this appointment.  You may see Mertie Moores, MD or one of the following Advanced Practice Providers on your designated Care Team: Richardson Dopp, PA-C [place], [place] . Daune Perch, NP  Any Other Special Instructions Will Be Listed Below (If Applicable).  Lifestyle Modifications to Prevent and Treat Heart Disease -Recommend heart healthy/Mediterranean diet, with whole grains, fruits, vegetables, fish, lean meats, nuts, olive oil and avocado oil.  -Limit salt intake to less than 1500 mg per day.  -Recommend moderate walking, starting slowly with a few minutes and working up to 3-5 times/week for 30-50 minutes each session. Aim for at least 150 minutes.week. Goal should be pace of 3 miles/hours, or walking 1.5 miles in 30 minutes -Recommend avoidance of tobacco products. Avoid excess alcohol. -Keep blood pressure well controlled, ideally less than 130/80.      Signed, Daune Perch, NP  05/23/2019 10:03 AM    [place]

## 2019-05-30 ENCOUNTER — Other Ambulatory Visit: Payer: Self-pay

## 2019-05-30 ENCOUNTER — Telehealth (INDEPENDENT_AMBULATORY_CARE_PROVIDER_SITE_OTHER): Payer: Medicare Other | Admitting: Family Medicine

## 2019-05-30 DIAGNOSIS — K219 Gastro-esophageal reflux disease without esophagitis: Secondary | ICD-10-CM

## 2019-05-30 NOTE — Assessment & Plan Note (Signed)
Much improved with addition of pepcid. Continue current regimen. Follow up if not improving, otherwise I will see him back in 6 months

## 2019-05-30 NOTE — Progress Notes (Signed)
[facility] Telemedicine Visit  Patient consented to have virtual visit. Method of visit: Telephone  Encounter participants: Patient: Jesse Franklin - located at home Provider: Chrisandra Netters - located at office Others (if applicable): n/a  Chief Complaint: follow up on acid reflux  HPI:  GERD - currently taking protonix 72m daily and pepcid 271mtwice daily. At last visit we added the pepcid because he was having symptoms of food getting stuck. Since adding the pepcid, things are doing much better. No longer feels food getting stuck. Eating well.  ROS: per HPI  Pertinent PMHx: history of GERD, afib on coumdain, CAD, hypertension, hyperlipidemia   Exam:  Respiratory: Patient speaking normally in full sentences throughout the encounter, without any respiratory distress evident.   Assessment/Plan:  GASTROESOPHAGEAL REFLUX, NO ESOPHAGITIS Much improved with addition of pepcid. Continue current regimen. Follow up if not improving, otherwise I will see him back in 6 months     Reviewed recent labwork with patient as well - all labs look good.  Time spent during visit with patient: 5 minutes

## 2019-07-11 ENCOUNTER — Encounter (INDEPENDENT_AMBULATORY_CARE_PROVIDER_SITE_OTHER): Payer: Self-pay

## 2019-07-11 ENCOUNTER — Ambulatory Visit (INDEPENDENT_AMBULATORY_CARE_PROVIDER_SITE_OTHER): Payer: Medicare Other | Admitting: *Deleted

## 2019-07-11 ENCOUNTER — Other Ambulatory Visit: Payer: Self-pay

## 2019-07-11 DIAGNOSIS — Z5181 Encounter for therapeutic drug level monitoring: Secondary | ICD-10-CM | POA: Diagnosis not present

## 2019-07-11 DIAGNOSIS — I4891 Unspecified atrial fibrillation: Secondary | ICD-10-CM | POA: Diagnosis not present

## 2019-07-11 LAB — POCT INR: INR: 1.9 — AB (ref 2.0–3.0)

## 2019-07-11 NOTE — Patient Instructions (Signed)
Description   Today take 2 tablets then continue on same dosage 1 tablet everyday except 1.5 tablets on Mondays and Fridays.  Recheck INR in 7 weeks. Call if placed on any new medications [phone].

## 2019-07-13 ENCOUNTER — Other Ambulatory Visit: Payer: Self-pay | Admitting: Cardiovascular Disease

## 2019-07-22 ENCOUNTER — Other Ambulatory Visit (HOSPITAL_COMMUNITY)
Admission: RE | Admit: 2019-07-22 | Discharge: 2019-07-22 | Disposition: A | Discharge status: Home / Self Care | Payer: Medicare Other | Source: Ambulatory Visit | Attending: Family Medicine | Admitting: Family Medicine

## 2019-07-22 ENCOUNTER — Ambulatory Visit (INDEPENDENT_AMBULATORY_CARE_PROVIDER_SITE_OTHER): Payer: Medicare Other | Admitting: Family Medicine

## 2019-07-22 ENCOUNTER — Other Ambulatory Visit: Payer: Self-pay

## 2019-07-22 VITALS — BP 105/60 | HR 57 | Wt 179.2 lb

## 2019-07-22 DIAGNOSIS — Z113 Encounter for screening for infections with a predominantly sexual mode of transmission: Secondary | ICD-10-CM

## 2019-07-22 DIAGNOSIS — Z0000 Encounter for general adult medical examination without abnormal findings: Secondary | ICD-10-CM | POA: Diagnosis not present

## 2019-07-22 NOTE — Patient Instructions (Signed)
Health Maintenance After Age 66 After age 70, you are at a higher risk for certain long-term diseases and infections as well as injuries from falls. Falls are a major cause of broken bones and head injuries in people who are older than age 59. Getting regular preventive care can help to keep you healthy and well. Preventive care includes getting regular testing and making lifestyle changes as recommended by your health care provider. Talk with your health care provider about:  Which screenings and tests you should have. A screening is a test that checks for a disease when you have no symptoms.  A diet and exercise plan that is right for you. What should I know about screenings and tests to prevent falls? Screening and testing are the best ways to find a health problem early. Early diagnosis and treatment give you the best chance of managing medical conditions that are common after age 59. Certain conditions and lifestyle choices may make you more likely to have a fall. Your health care provider may recommend:  Regular vision checks. Poor vision and conditions such as cataracts can make you more likely to have a fall. If you wear glasses, make sure to get your prescription updated if your vision changes.  Medicine review. Work with your health care provider to regularly review all of the medicines you are taking, including over-the-counter medicines. Ask your health care provider about any side effects that may make you more likely to have a fall. Tell your health care provider if any medicines that you take make you feel dizzy or sleepy.  Osteoporosis screening. Osteoporosis is a condition that causes the bones to get weaker. This can make the bones weak and cause them to break more easily.  Blood pressure screening. Blood pressure changes and medicines to control blood pressure can make you feel dizzy.  Strength and balance checks. Your health care provider may recommend certain tests to  check your strength and balance while standing, walking, or changing positions.  Foot health exam. Foot pain and numbness, as well as not wearing proper footwear, can make you more likely to have a fall.  Depression screening. You may be more likely to have a fall if you have a fear of falling, feel emotionally low, or feel unable to do activities that you used to do.  Alcohol use screening. Using too much alcohol can affect your balance and may make you more likely to have a fall. What actions can I take to lower my risk of falls? General instructions  Talk with your health care provider about your risks for falling. Tell your health care provider if: ? You fall. Be sure to tell your health care provider about all falls, even ones that seem minor. ? You feel dizzy, sleepy, or off-balance.  Take over-the-counter and prescription medicines only as told by your health care provider. These include any supplements.  Eat a healthy diet and maintain a healthy weight. A healthy diet includes low-fat dairy products, low-fat (lean) meats, and fiber from whole grains, beans, and lots of fruits and vegetables. Home safety  Remove any tripping hazards, such as rugs, cords, and clutter.  Install safety equipment such as grab bars in bathrooms and safety rails on stairs.  Keep rooms and walkways well-lit. Activity   Follow a regular exercise program to stay fit. This will help you maintain your balance. Ask your health care provider what types of exercise are appropriate for you.  If you need a  cane or walker, use it as recommended by your health care provider.  Wear supportive shoes that have nonskid soles. Lifestyle  Do not drink alcohol if your health care provider tells you not to drink.  If you drink alcohol, limit how much you have: ? 0-1 drink a day for women. ? 0-2 drinks a day for men.  Be aware of how much alcohol is in your drink. In the [place] one drink equals one typical bottle  of beer (12 oz), one-half glass of wine (5 oz), or one shot of hard liquor (1 oz).  Do not use any products that contain nicotine or tobacco, such as cigarettes and e-cigarettes. If you need help quitting, ask your health care provider. Summary  Having a healthy lifestyle and getting preventive care can help to protect your health and wellness after age 63.  Screening and testing are the best way to find a health problem early and help you avoid having a fall. Early diagnosis and treatment give you the best chance for managing medical conditions that are more common for people who are older than age 23.  Falls are a major cause of broken bones and head injuries in people who are older than age 63. Take precautions to prevent a fall at home.  Work with your health care provider to learn what changes you can make to improve your health and wellness and to prevent falls. This information is not intended to replace advice given to you by your health care provider. Make sure you discuss any questions you have with your health care provider. Document Released: 07/11/2017 Document Revised: 12/19/2018 Document Reviewed: 07/11/2017 Elsevier Patient Education  2020 [employer].

## 2019-07-22 NOTE — Progress Notes (Signed)
Date of Visit: 07/22/2019   HPI:  Patient presents today for a well adult male exam, getting this done due to incentive from his insurance company.  Concerns today: none Sexual activity: yes; multiple male partners only; vaginal and receptive oral sex; does not use protection; denies penile lesions, dysuria, urethral discharge STD Screening: desires today  Exercise: walks every morning (15-20 minutes) Diet: well-balanced, salads, no red meats, eats chicken/pork/fish, likes fruits  Smoking: yes, 3-5 cigarettes/day, trying to quit Alcohol: none Drugs: smokes marijuana daily - patient thinks this helps his mood, helps him not be suspicious of people Advance directives: Would like to be full code. His surrogate decision maker will be his sister. Mood: up and down; but overall OK  ROS: per HPI  [place]:  Cancers in family: Sister - vaginal cancer, died at young age  PHYSICAL EXAM: BP 105/60   Pulse (!) 57   Wt 179 lb 3.2 oz (81.3 kg)   SpO2 99%   BMI 28.92 kg/m  Gen: NAD, pleasant, cooperative HEENT: NCAT, PERRL, no palpable thyromegaly or anterior cervical lymphadenopathy Heart: RRR, no murmurs Lungs: CTAB, NWOB Abdomen: soft, nontender to palpation Neuro: grossly nonfocal, speech normal  ASSESSMENT/PLAN:  Health maintenance:  -STD screening: will receive today - check HIV, RPR, gc/chlamydia/trich -immunizations: declines flu, TDAP, and pneumococcal vaccine -colon cancer screening: current -advance directives: Will provide information on advanced directive, encouraged him to have paperwork actually completed -handout given on health maintenance topics  Gwynneth Albright, Medical Student   Patient seen along with MS3 student Gwynneth Albright. I personally evaluated this patient along with the student, and verified all aspects of the history, physical exam, and medical decision making as documented by the student. I agree with the student's documentation and have made all  necessary edits.  Chrisandra Netters, MD [place]

## 2019-07-23 LAB — URINE CYTOLOGY ANCILLARY ONLY
Chlamydia: NEGATIVE
Comment: NEGATIVE
Comment: NEGATIVE
Comment: NORMAL
Neisseria Gonorrhea: NEGATIVE
Trichomonas: POSITIVE — AB

## 2019-07-24 ENCOUNTER — Telehealth: Payer: Self-pay | Admitting: Family Medicine

## 2019-07-24 LAB — RPR: RPR Ser Ql: NONREACTIVE

## 2019-07-24 LAB — HIV ANTIBODY (ROUTINE TESTING W REFLEX): HIV Screen 4th Generation wRfx: NONREACTIVE

## 2019-07-24 MED ORDER — TINIDAZOLE 500 MG PO TABS: 2.0000 g | ORAL_TABLET | Freq: Once | ORAL | 0 refills | Status: AC

## 2019-07-24 NOTE — Telephone Encounter (Signed)
Called patient and informed him of + trichomonas test. Advised of need for treatment, as well as informing his partners of + test. Should wait 7 days after he and his partner(s) have been treated before resuming intercourse. Advised on importance of using condoms to prevent transmission of STDs. Advised to take tinidazole 2g at once (selected tinidazole as appears to have lower risk of interaction with coumadin - no interactions identified via uptodate or lexicomp).  Patient agreeable & appreciative.  Leeanne Rio, MD

## 2019-07-30 ENCOUNTER — Telehealth: Payer: Self-pay | Admitting: Family Medicine

## 2019-07-30 NOTE — Telephone Encounter (Incomplete)
Handicapped  form dropped off for at front desk for completion.  Verified that patient section of form has been completed.  Last DOS/WCC with PCP was ***.  Placed form in team folder to be completed by clinical staff.  Jesse Franklin

## 2019-07-31 NOTE — Telephone Encounter (Signed)
Reviewed form for handicap placard and placed in PCP's box for completion.  Jesse Franklin, [place]

## 2019-08-06 NOTE — Telephone Encounter (Signed)
Called and spoke with patient. He needs this form because he has pain in his legs and gets tired when walking long distances in parking lots. Reasonable. Will fill out form for 5 years. Patient aware this will be placed at the front desk for him to pick up. Routing to [facility] RN team  Leeanne Rio, MD

## 2019-08-12 NOTE — Telephone Encounter (Signed)
Patient contacted and informed of form ready for pick up.

## 2019-08-26 ENCOUNTER — Other Ambulatory Visit: Payer: Self-pay | Admitting: Family Medicine

## 2019-08-29 ENCOUNTER — Ambulatory Visit (INDEPENDENT_AMBULATORY_CARE_PROVIDER_SITE_OTHER): Payer: Medicare Other | Admitting: *Deleted

## 2019-08-29 ENCOUNTER — Other Ambulatory Visit: Payer: Self-pay

## 2019-08-29 DIAGNOSIS — I4891 Unspecified atrial fibrillation: Secondary | ICD-10-CM | POA: Diagnosis not present

## 2019-08-29 DIAGNOSIS — Z5181 Encounter for therapeutic drug level monitoring: Secondary | ICD-10-CM

## 2019-08-29 LAB — POCT INR: INR: 2.7 (ref 2.0–3.0)

## 2019-08-29 NOTE — Patient Instructions (Signed)
Description   Continue on same dosage 1 tablet everyday except 1.5 tablets on Mondays and Fridays.  Recheck INR in 8 weeks. Call if placed on any new medications [phone].

## 2019-09-01 DIAGNOSIS — I129 Hypertensive chronic kidney disease with stage 1 through stage 4 chronic kidney disease, or unspecified chronic kidney disease: Secondary | ICD-10-CM | POA: Diagnosis not present

## 2019-09-01 DIAGNOSIS — N179 Acute kidney failure, unspecified: Secondary | ICD-10-CM | POA: Diagnosis not present

## 2019-09-01 DIAGNOSIS — N182 Chronic kidney disease, stage 2 (mild): Secondary | ICD-10-CM | POA: Diagnosis not present

## 2019-09-01 DIAGNOSIS — R809 Proteinuria, unspecified: Secondary | ICD-10-CM | POA: Diagnosis not present

## 2019-09-01 DIAGNOSIS — I2510 Atherosclerotic heart disease of native coronary artery without angina pectoris: Secondary | ICD-10-CM | POA: Diagnosis not present

## 2019-09-11 ENCOUNTER — Other Ambulatory Visit: Payer: Self-pay | Admitting: Family Medicine

## 2019-09-13 ENCOUNTER — Other Ambulatory Visit: Payer: Self-pay | Admitting: Family Medicine

## 2019-09-14 ENCOUNTER — Other Ambulatory Visit: Payer: Self-pay | Admitting: Family Medicine

## 2019-09-18 DIAGNOSIS — K7689 Other specified diseases of liver: Secondary | ICD-10-CM | POA: Diagnosis not present

## 2019-09-18 DIAGNOSIS — K7581 Nonalcoholic steatohepatitis (NASH): Secondary | ICD-10-CM | POA: Diagnosis not present

## 2019-09-24 ENCOUNTER — Other Ambulatory Visit: Payer: Self-pay | Admitting: Nurse Practitioner

## 2019-09-24 DIAGNOSIS — K7689 Other specified diseases of liver: Secondary | ICD-10-CM

## 2019-09-24 DIAGNOSIS — H903 Sensorineural hearing loss, bilateral: Secondary | ICD-10-CM | POA: Diagnosis not present

## 2019-10-08 ENCOUNTER — Telehealth: Payer: Self-pay

## 2019-10-08 NOTE — Telephone Encounter (Signed)
LVM for pt to call the office before appt tomorrow to ask him some questions. Also, let him know that if the office is on a delay or closed due to the weather we will try to call him in the am before his appt to reschedule. Ottis Stain, CMA

## 2019-10-09 ENCOUNTER — Ambulatory Visit (INDEPENDENT_AMBULATORY_CARE_PROVIDER_SITE_OTHER): Payer: Medicare Other | Admitting: Family Medicine

## 2019-10-09 ENCOUNTER — Other Ambulatory Visit: Payer: Self-pay

## 2019-10-09 DIAGNOSIS — I10 Essential (primary) hypertension: Secondary | ICD-10-CM

## 2019-10-09 DIAGNOSIS — R351 Nocturia: Secondary | ICD-10-CM

## 2019-10-09 DIAGNOSIS — I4891 Unspecified atrial fibrillation: Secondary | ICD-10-CM

## 2019-10-09 DIAGNOSIS — N401 Enlarged prostate with lower urinary tract symptoms: Secondary | ICD-10-CM

## 2019-10-09 DIAGNOSIS — K219 Gastro-esophageal reflux disease without esophagitis: Secondary | ICD-10-CM | POA: Diagnosis not present

## 2019-10-09 MED ORDER — PANTOPRAZOLE SODIUM 40 MG PO TBEC: 40.0000 mg | DELAYED_RELEASE_TABLET | Freq: Every day | ORAL | 3 refills | Status: DC

## 2019-10-09 MED ORDER — AMLODIPINE BESYLATE 10 MG PO TABS: 10.0000 mg | ORAL_TABLET | Freq: Every day | ORAL | 3 refills | Status: DC

## 2019-10-09 NOTE — Assessment & Plan Note (Signed)
On warfarin. Encouraged him to think about DOAC and discuss with his cardiologist

## 2019-10-09 NOTE — Assessment & Plan Note (Signed)
Well-controlled.  Continue current regimen. 

## 2019-10-09 NOTE — Progress Notes (Signed)
Date of Visit: 10/09/2019   HPI:  Jesse Franklin presents today to review medications and ensure he is current on HM items.   Was visited by a NP through his insurance, who advised him to speak with his doctor about lung cancer screening, AAA screening, and vaccinations.  Smokes up to 5-6 cigs per day x50 years, also smokes MJ Had abdominal ultrasound in 2019 which showed no AAA He declines all vaccinations today  Hypertension - taking amlodipine 75m daily, tolerating well  BPH - taking flomax 0.482mdaily, urine flowing well for him on this  Gout - followed by rheum, takes allopurinol 10069mwice daily and colchicine 0.6mg61mice daily, no recent flares  GERD - takes famotidine 20mg4mce daily and protonix 40mg 61my, tolerating well, symptoms well controlled  PMFSH:[place] of hyperlipidemia, gout, tobacco abuse, hypertension, CAD, afib   PHYSICAL EXAM: BP 127/65   Pulse 64   Wt 183 lb 3.2 oz (83.1 kg)   SpO2 99%   BMI 29.57 kg/m  Gen: no acute distress, pleasant, cooperative, well appearing Lungs: normal work of breathing  Neuro: speech normal, grossly nonfocal, gait normal  ASSESSMENT/PLAN:  Health maintenance:  -patient declines flu, pneumovax, shingles, Tdap, and COVID vaccinations despite counseling -had abdominal ultrasound in 2019 which did not demonstrate any aortic aneurysms -pack years do not equal 30, unable to get low dose lung cancer CT (smoked for 50 years, up to 5-6 cigarettes per day)  HYPERTENSION, BENIGN ESSENTIAL Well controlled. Continue current regimen.   GASTROESOPHAGEAL REFLUX, NO ESOPHAGITIS Well controlled, continue protonix and famotidine  Atrial fibrillation On warfarin. Encouraged him to think about DOAC and discuss with his cardiologist  BPH (benign prostatic hyperplasia) Well controlled on flomax  FOLLOW UP: Follow up in 6-7 mos for routine medical problems  Jesse Franklin

## 2019-10-09 NOTE — Patient Instructions (Signed)
Stay on current medications Refilled protonix and amlodipine Call with any questions See me in about 6-7 months  Be well, Dr. Ardelia Mems

## 2019-10-09 NOTE — Assessment & Plan Note (Signed)
Well controlled on flomax

## 2019-10-09 NOTE — Assessment & Plan Note (Signed)
Well controlled, continue protonix and famotidine

## 2019-10-20 DIAGNOSIS — H905 Unspecified sensorineural hearing loss: Secondary | ICD-10-CM | POA: Diagnosis not present

## 2019-10-22 ENCOUNTER — Other Ambulatory Visit: Payer: Self-pay | Admitting: Nurse Practitioner

## 2019-10-24 ENCOUNTER — Ambulatory Visit (INDEPENDENT_AMBULATORY_CARE_PROVIDER_SITE_OTHER): Payer: Medicare Other | Admitting: *Deleted

## 2019-10-24 ENCOUNTER — Other Ambulatory Visit: Payer: Self-pay

## 2019-10-24 DIAGNOSIS — I4891 Unspecified atrial fibrillation: Secondary | ICD-10-CM

## 2019-10-24 DIAGNOSIS — Z5181 Encounter for therapeutic drug level monitoring: Secondary | ICD-10-CM | POA: Diagnosis not present

## 2019-10-24 LAB — POCT INR: INR: 2.9 (ref 2.0–3.0)

## 2019-10-24 NOTE — Patient Instructions (Signed)
Description   Continue on same dosage 1 tablet everyday except 1.5 tablets on Mondays and Fridays.  Recheck INR in 8 weeks. Call if placed on any new medications [phone].

## 2019-10-31 ENCOUNTER — Other Ambulatory Visit: Payer: Self-pay | Admitting: Cardiovascular Disease

## 2019-11-07 ENCOUNTER — Telehealth: Payer: Self-pay | Admitting: Family Medicine

## 2019-11-07 NOTE — Telephone Encounter (Signed)
Pt is trying to get a refill on his cholesterol medication and the pharmacy states that he does not have any refills and that he needed to call the doctors office. [phone] Rich

## 2019-11-13 MED ORDER — ROSUVASTATIN CALCIUM 40 MG PO TABS: 40.0000 mg | ORAL_TABLET | Freq: Every day | ORAL | 3 refills | Status: DC

## 2019-11-13 NOTE — Telephone Encounter (Signed)
rx sent in Leeanne Rio, MD

## 2019-11-14 ENCOUNTER — Inpatient Hospital Stay: Admission: RE | Admit: 2019-11-14 | Payer: Medicare Other | Source: Ambulatory Visit

## 2019-12-03 ENCOUNTER — Ambulatory Visit: Payer: Medicare Other | Admitting: Cardiovascular Disease

## 2019-12-18 ENCOUNTER — Encounter: Payer: Self-pay | Admitting: Cardiovascular Disease

## 2019-12-18 NOTE — Progress Notes (Signed)
Cardiology Office Note   Date:  12/19/2019   ID:  Jesse Franklin, DOB [birth year], MRN [id number]  PCP:  Leeanne Rio, MD  Cardiologist:   Mertie Moores, MD   Chief Complaint  Patient presents with  . Coronary Artery Disease  . Hyperlipidemia   1. Atrial fibrillation -  2. Coronary artery disease RESULTS: 08/30/04 1. Left main: Normal.  2. LAD: Large vessel, tortuous in the mid and distal section with mild  luminal irregularities. The extreme distal portion of the LAD after the  trifurcation of the distal vessel has a subtotal occlusion at the  trifurcation tip. Vessel less than 0.5 mm at this juncture.  3. LXC: Codominant with mild luminal irregularities.  4. OM1 and OM2: Large vessels with mild luminal irregularities.  5. RCA: Tortuous, codominant with mild luminal irregularities, very faint  right to left filling collaterals seen at the apex.  6. LV: EF 50-55% with mild inferior apical hypokinesis. LVEDP was 35  mmHg.  7. Abdominal/distal aortogram showed mild tortuosity. No significant  aneurysms were seen. The distal aorta showed mild splaying to the left  at the area of the iliac bifurcation. No significant iliac or femoral  disease was noted.  3. Hypertension 4. 6. Moderate pulmonary hypertension by echo in 2010 ( ongoing cigarette smoking)  5. Hyperlipidemia   Jesse Franklin is a 69 yo with hx as noted above. He has occasional episodes of left arm tingling. These episodes last 1-2 seconds. They're not necessarily associated with exertion. The tingling seems to get better with arm movement. He also has occasional episodes of shortness of breath.  He walks on a regular basis. He walks approximately 1 mile a day. He still eats some salt and also eats fried and fast foods.  December 02, 2013:  Jesse Franklin is doing well. He denies any chest pain or shortness of breath. He walks about 15-20 minutes each day. He no longer works. He has cut down on his salt  usage.    [name] 5, 2016:   Jesse Franklin is a 69 y.o. male who presents for follow-up of his atrial fibrillation. He also has a history of hypertension  Still smoking  -   Nov. 1, 2016:  Doing well from a cardiac standpoint.  Having gout issues  in left elbow and hand.  No Cp or dyspnea.  Still smoking   January 03, 2016:    Jesse Franklin is seen today  No CP , some back pain  Still smoking    December 25, 2016  occasional cp .  No changes in his angina pattern Still smoking   July 27, 2017:  Staying active , no further CP or dyspnea .  Sells loose cigaretes.  $0.50 per cigarette   May 09, 2018:  Jesse Franklin was seen today for follow-up visit. History of coronary artery disease and atrial fibrillation. He remains on Coumadin. HR is slow (junctional escape rhythm) sees.  He is not having any episodes of weakness or dizziness.  He has occasional episodes of left-sided arm and left-sided shoulder pain. Last few seconds. No chest pain with walking.  He still smokes 3 to 4 cigarettes a day. Also smokes marijuana   He has known coronary artery disease.  He has a distal LAD subtotal occlusion by heart catheterization in 2005.   December 19, 2019: Jesse Franklin is seen back today for follow up visit for his CAD, atrial fib and HLD Has rare cp.   Does not feel like his  pain before his stent .   Last for 3-4 seconds.  Had to take NTG on 1 occasion.   Walks around some  Walking does not bring on the cp.  Typically occurs while he is sitting   Still smokes several cigarettes a day .   We discussed cessation efforts.      Past Medical History:  Diagnosis Date  . Alcohol abuse   . Anemia   . Atrial fibrillation ([place])   . BPH 11/08/2006  . CAD (coronary artery disease)    [place]  . Cocaine abuse ([facility])    Hx of  . GERD (gastroesophageal reflux disease)   . Gout   . Herpes   . Herpes genitalia 04/13/2011  . Hyperlipidemia   . Hypertension   . Myocardial infarction ([place]) 2005  .  RHINITIS, ALLERGIC 11/08/2006  . Tobacco abuse     Past Surgical History:  Procedure Laterality Date  . CARDIAC CATHETERIZATION  08/2004  . CARPAL TUNNEL RELEASE Right 04/04/2017   Procedure: RIGHT CARPAL TUNNEL RELEASE;  Surgeon: Leandrew Koyanagi, MD;  Location: [facility];  Service: Orthopedics;  Laterality: Right;  . LACERATION REPAIR Right 03/1988   "hand"     Current Outpatient Medications  Medication Sig Dispense Refill  . allopurinol (ZYLOPRIM) 100 MG tablet Take 100 mg by mouth 2 (two) times daily.     Marland Kitchen amLODipine (NORVASC) 10 MG tablet Take 1 tablet (10 mg total) by mouth daily. 90 tablet 3  . clopidogrel (PLAVIX) 75 MG tablet Take 1 tablet (75 mg total) by mouth daily. 90 tablet 2  . colchicine 0.6 MG tablet TAKE 1 TABLET BY MOUTH TWICE A DAY 180 tablet 0  . famotidine (PEPCID) 20 MG tablet TAKE 1 TABLET BY MOUTH TWICE A DAY 180 tablet 3  . ferrous sulfate 325 (65 FE) MG tablet TAKE 1 TABLET BY MOUTH EVERY DAY WITH BREAKFAST 90 tablet 1  . nitroGLYCERIN (NITROSTAT) 0.4 MG SL tablet Place 1 tablet (0.4 mg total) under the tongue as directed. Place 1 tab under tongue as directed 25 tablet 6  . pantoprazole (PROTONIX) 40 MG tablet Take 1 tablet (40 mg total) by mouth daily. 90 tablet 3  . rosuvastatin (CRESTOR) 40 MG tablet Take 1 tablet (40 mg total) by mouth daily. 90 tablet 3  . tamsulosin (FLOMAX) 0.4 MG CAPS capsule TAKE 1 CAPSULE BY MOUTH EVERY DAY 90 capsule 3  . warfarin (COUMADIN) 5 MG tablet TAKE AS DIRECTED BY COUMADIN CLINIC 120 tablet 0   No current facility-administered medications for this visit.   Facility-Administered Medications Ordered in Other Visits  Medication Dose Route Frequency Provider Last Rate Last Admin  . 0.9 %  sodium chloride infusion   Intravenous Continuous Willeen Niece, MD 125 mL/hr at 07/31/14 1200 New Bag at 07/31/14 1200    Allergies:   Codeine and Ketorolac tromethamine    Social History:  The patient  reports that he has  been smoking cigarettes. He has a 12.00 pack-year smoking history. He quit smokeless tobacco use about 52 years ago.  His smokeless tobacco use included chew. He reports current drug use. Drugs: "Crack" cocaine and Marijuana. He reports that he does not drink alcohol.   Family History:  The patient's family history includes Alcohol abuse in his brother and father; Arthritis in his brother; Cancer in his sister; Gout in his mother; Heart disease in his brother; Hypertension in his mother and sister.    ROS:  Noted in current history, otherwise review of systems is negative.  Physical Exam: Blood pressure (!) 106/56, pulse 60, height 5' 5"  (1.651 m), weight 178 lb 12.8 oz (81.1 kg), SpO2 97 %.  GEN:  Well nourished, well developed in no acute distress HEENT: Normal NECK: No JVD; No carotid bruits LYMPHATICS: No lymphadenopathy CARDIAC: Irreg. Irreg.  RESPIRATORY:  Clear to auscultation without rales, wheezing or rhonchi  ABDOMEN: Soft, non-tender, non-distended MUSCULOSKELETAL:  No edema; No deformity  SKIN: Warm and dry NEUROLOGIC:  Alert and oriented x 3   EKG:        Recent Labs: 05/08/2019: ALT 44; BUN 9; Creatinine, Ser 1.18; Hemoglobin 13.0; Platelets 145; Potassium 4.0; Sodium 143    Lipid Panel    Component Value Date/Time   CHOL 125 05/08/2019 1012   TRIG 105 05/08/2019 1012   HDL 39 (L) 05/08/2019 1012   CHOLHDL 3.2 05/08/2019 1012   CHOLHDL 3.4 08/02/2016 0913   VLDL 26 08/02/2016 0913   LDLCALC 65 05/08/2019 1012   LDLDIRECT 95 05/28/2012 0925      Wt Readings from Last 3 Encounters:  12/19/19 178 lb 12.8 oz (81.1 kg)  10/09/19 183 lb 3.2 oz (83.1 kg)  07/22/19 179 lb 3.2 oz (81.3 kg)      Other studies Reviewed: Additional studies/ records that were reviewed today include: . Review of the above records demonstrates:    ASSESSMENT AND PLAN:  1. Atrial fibrillation-   he is in chronic atrial fibrillation. He is on Coumadin. Continue current  medications.  2. Coronary artery disease Cath  08/30/04 He is not had any recurrent episodes of angina. Continue current medications. I have advised him to stop smoking completely. ..  3. Hypertension -blood pressure is well controlled.   4.  Moderate pulmonary hypertension by echo in 2010 ( ongoing cigarette smoking) -.  5. Hyperlipidemia -continue rosuvastatin 40 mg a day. Will check lipids, liver enzymes, basic metabolic profile today.   Current medicines are reviewed at length with the patient today.  The patient does not have concerns regarding medicines.  The following changes have been made:  no change  Labs/ tests ordered today include:  No orders of the defined types were placed in this encounter.   Disposition:   FU with APP  in 6 months  . I;ll see him in 1 year    Mertie Moores, MD  12/19/2019 11:37 AM    [address], [place], [place]  [id number] Phone: [phone]; Fax: [phone]

## 2019-12-19 ENCOUNTER — Ambulatory Visit (INDEPENDENT_AMBULATORY_CARE_PROVIDER_SITE_OTHER): Payer: Medicare Other | Admitting: Cardiovascular Disease

## 2019-12-19 ENCOUNTER — Encounter: Payer: Self-pay | Admitting: Cardiovascular Disease

## 2019-12-19 ENCOUNTER — Other Ambulatory Visit: Payer: Self-pay

## 2019-12-19 ENCOUNTER — Ambulatory Visit (INDEPENDENT_AMBULATORY_CARE_PROVIDER_SITE_OTHER): Payer: Medicare Other | Admitting: *Deleted

## 2019-12-19 VITALS — BP 106/56 | HR 60 | Ht 65.0 in | Wt 178.8 lb

## 2019-12-19 DIAGNOSIS — I4811 Longstanding persistent atrial fibrillation: Secondary | ICD-10-CM

## 2019-12-19 DIAGNOSIS — E782 Mixed hyperlipidemia: Secondary | ICD-10-CM

## 2019-12-19 DIAGNOSIS — I10 Essential (primary) hypertension: Secondary | ICD-10-CM

## 2019-12-19 DIAGNOSIS — I4891 Unspecified atrial fibrillation: Secondary | ICD-10-CM

## 2019-12-19 DIAGNOSIS — Z5181 Encounter for therapeutic drug level monitoring: Secondary | ICD-10-CM

## 2019-12-19 DIAGNOSIS — I2510 Atherosclerotic heart disease of native coronary artery without angina pectoris: Secondary | ICD-10-CM | POA: Diagnosis not present

## 2019-12-19 DIAGNOSIS — F17200 Nicotine dependence, unspecified, uncomplicated: Secondary | ICD-10-CM

## 2019-12-19 LAB — BASIC METABOLIC PANEL
BUN/Creatinine Ratio: 9 — ABNORMAL LOW (ref 10–24)
BUN: 10 mg/dL (ref 8–27)
CO2: 21 mmol/L (ref 20–29)
Calcium: 9.6 mg/dL (ref 8.6–10.2)
Chloride: 108 mmol/L — ABNORMAL HIGH (ref 96–106)
Creatinine, Ser: 1.17 mg/dL (ref 0.76–1.27)
GFR calc Af Amer: 75 mL/min/{1.73_m2} (ref >59)
GFR calc non Af Amer: 65 mL/min/{1.73_m2} (ref >59)
Glucose: 86 mg/dL (ref 65–99)
Potassium: 4.2 mmol/L (ref 3.5–5.2)
Sodium: 144 mmol/L (ref 134–144)

## 2019-12-19 LAB — LIPID PANEL
Chol/HDL Ratio: 3 ratio (ref 0.0–5.0)
Cholesterol, Total: 118 mg/dL (ref 100–199)
HDL: 40 mg/dL (ref >39)
LDL Chol Calc (NIH): 58 mg/dL (ref 0–99)
Triglycerides: 106 mg/dL (ref 0–149)
VLDL Cholesterol Cal: 20 mg/dL (ref 5–40)

## 2019-12-19 LAB — HEPATIC FUNCTION PANEL
ALT: 36 IU/L (ref 0–44)
AST: 35 IU/L (ref 0–40)
Albumin: 4.5 g/dL (ref 3.8–4.8)
Alkaline Phosphatase: 91 IU/L (ref 39–117)
Bilirubin Total: 0.4 mg/dL (ref 0.0–1.2)
Bilirubin, Direct: 0.18 mg/dL (ref 0.00–0.40)
Total Protein: 7.3 g/dL (ref 6.0–8.5)

## 2019-12-19 LAB — POCT INR: INR: 2.9 (ref 2.0–3.0)

## 2019-12-19 NOTE — Patient Instructions (Signed)
Description   Continue on same dosage 1 tablet everyday except 1.5 tablets on Mondays and Fridays.  Recheck INR in 8 weeks. Call if placed on any new medications [phone].

## 2019-12-19 NOTE — Patient Instructions (Signed)
Medication Instructions:  Your physician recommends that you continue on your current medications as directed. Please refer to the Current Medication list given to you today.  *If you need a refill on your cardiac medications before your next appointment, please call your pharmacy*   Lab Work: TODAY - cholesterol, liver panel, basic metabolic panel If you have labs (blood work) drawn today and your tests are completely normal, you will receive your results only by: Marland Kitchen MyChart Message (if you have MyChart) OR . A paper copy in the mail If you have any lab test that is abnormal or we need to change your treatment, we will call you to review the results.   Testing/Procedures: None Ordered   Follow-Up: At [facility], you and your health needs are our priority.  As part of our continuing mission to provide you with exceptional heart care, we have created designated Provider Care Teams.  These Care Teams include your primary Cardiologist (physician) and Advanced Practice Providers (APPs -  Physician Assistants and Nurse Practitioners) who all work together to provide you with the care you need, when you need it.  We recommend signing up for the patient portal called "MyChart".  Sign up information is provided on this After Visit Summary.  MyChart is used to connect with patients for Virtual Visits (Telemedicine).  Patients are able to view lab/test results, encounter notes, upcoming appointments, etc.  Non-urgent messages can be sent to your provider as well.   To learn more about what you can do with MyChart, go to [email or web address].    Your next appointment:   6 month(s)  The format for your next appointment:   In Person  Provider:   Richardson Dopp, PA-C or Robbie Lis, PA-C

## 2020-01-12 ENCOUNTER — Other Ambulatory Visit: Payer: Self-pay | Admitting: Family Medicine

## 2020-01-26 ENCOUNTER — Encounter: Payer: Self-pay | Admitting: Family Medicine

## 2020-01-26 DIAGNOSIS — M1A09X1 Idiopathic chronic gout, multiple sites, with tophus (tophi): Secondary | ICD-10-CM | POA: Diagnosis not present

## 2020-01-26 DIAGNOSIS — Z79899 Other long term (current) drug therapy: Secondary | ICD-10-CM | POA: Diagnosis not present

## 2020-02-13 ENCOUNTER — Other Ambulatory Visit: Payer: Self-pay

## 2020-02-13 ENCOUNTER — Ambulatory Visit (INDEPENDENT_AMBULATORY_CARE_PROVIDER_SITE_OTHER): Payer: Medicare Other

## 2020-02-13 DIAGNOSIS — I4891 Unspecified atrial fibrillation: Secondary | ICD-10-CM | POA: Diagnosis not present

## 2020-02-13 DIAGNOSIS — Z5181 Encounter for therapeutic drug level monitoring: Secondary | ICD-10-CM

## 2020-02-13 LAB — POCT INR: INR: 2.4 (ref 2.0–3.0)

## 2020-02-13 NOTE — Patient Instructions (Signed)
Description   Continue on same dosage 1 tablet everyday except 1.5 tablets on Mondays and Fridays.  Recheck INR in 8 weeks. Call if placed on any new medications [phone].

## 2020-02-26 DIAGNOSIS — M1A09X1 Idiopathic chronic gout, multiple sites, with tophus (tophi): Secondary | ICD-10-CM | POA: Diagnosis not present

## 2020-02-26 DIAGNOSIS — Z79899 Other long term (current) drug therapy: Secondary | ICD-10-CM | POA: Diagnosis not present

## 2020-02-26 DIAGNOSIS — M2550 Pain in unspecified joint: Secondary | ICD-10-CM | POA: Diagnosis not present

## 2020-02-29 DIAGNOSIS — H5213 Myopia, bilateral: Secondary | ICD-10-CM | POA: Diagnosis not present

## 2020-03-10 ENCOUNTER — Other Ambulatory Visit: Payer: Self-pay | Admitting: Cardiovascular Disease

## 2020-03-17 ENCOUNTER — Other Ambulatory Visit: Payer: Self-pay | Admitting: Nurse Practitioner

## 2020-03-17 DIAGNOSIS — K7689 Other specified diseases of liver: Secondary | ICD-10-CM

## 2020-03-17 DIAGNOSIS — K7581 Nonalcoholic steatohepatitis (NASH): Secondary | ICD-10-CM | POA: Diagnosis not present

## 2020-03-18 ENCOUNTER — Other Ambulatory Visit: Payer: Self-pay | Admitting: Nurse Practitioner

## 2020-03-18 DIAGNOSIS — K7689 Other specified diseases of liver: Secondary | ICD-10-CM

## 2020-03-24 DIAGNOSIS — K7402 Hepatic fibrosis, advanced fibrosis: Secondary | ICD-10-CM | POA: Diagnosis not present

## 2020-03-24 DIAGNOSIS — K7581 Nonalcoholic steatohepatitis (NASH): Secondary | ICD-10-CM | POA: Diagnosis not present

## 2020-03-28 ENCOUNTER — Ambulatory Visit
Admission: RE | Admit: 2020-03-28 | Discharge: 2020-03-28 | Disposition: A | Discharge status: Home / Self Care | Payer: Medicare Other | Source: Ambulatory Visit | Attending: Nurse Practitioner | Admitting: Nurse Practitioner

## 2020-03-28 DIAGNOSIS — K7689 Other specified diseases of liver: Secondary | ICD-10-CM

## 2020-04-01 ENCOUNTER — Other Ambulatory Visit: Payer: Medicare Other

## 2020-04-09 ENCOUNTER — Ambulatory Visit (INDEPENDENT_AMBULATORY_CARE_PROVIDER_SITE_OTHER): Payer: Medicare Other

## 2020-04-09 ENCOUNTER — Other Ambulatory Visit: Payer: Self-pay

## 2020-04-09 DIAGNOSIS — I4891 Unspecified atrial fibrillation: Secondary | ICD-10-CM | POA: Diagnosis not present

## 2020-04-09 DIAGNOSIS — Z5181 Encounter for therapeutic drug level monitoring: Secondary | ICD-10-CM

## 2020-04-09 LAB — POCT INR: INR: 2.6 (ref 2.0–3.0)

## 2020-04-09 NOTE — Patient Instructions (Signed)
Continue on same dosage 1 tablet everyday except 1.5 tablets on Mondays and Fridays.  Recheck INR in 8 weeks. Call if placed on any new medications [phone].

## 2020-04-30 DIAGNOSIS — I10 Essential (primary) hypertension: Secondary | ICD-10-CM | POA: Diagnosis not present

## 2020-04-30 DIAGNOSIS — D6869 Other thrombophilia: Secondary | ICD-10-CM | POA: Diagnosis not present

## 2020-04-30 DIAGNOSIS — M109 Gout, unspecified: Secondary | ICD-10-CM | POA: Diagnosis not present

## 2020-04-30 DIAGNOSIS — E785 Hyperlipidemia, unspecified: Secondary | ICD-10-CM | POA: Diagnosis not present

## 2020-05-06 ENCOUNTER — Other Ambulatory Visit: Payer: Self-pay | Admitting: Cardiovascular Disease

## 2020-05-13 ENCOUNTER — Other Ambulatory Visit: Payer: Self-pay

## 2020-05-13 ENCOUNTER — Ambulatory Visit (INDEPENDENT_AMBULATORY_CARE_PROVIDER_SITE_OTHER): Payer: Medicare Other | Admitting: Family Medicine

## 2020-05-13 ENCOUNTER — Encounter: Payer: Self-pay | Admitting: Family Medicine

## 2020-05-13 VITALS — BP 110/70 | HR 69 | Ht 66.5 in | Wt 175.6 lb

## 2020-05-13 DIAGNOSIS — Z23 Encounter for immunization: Secondary | ICD-10-CM

## 2020-05-13 DIAGNOSIS — I10 Essential (primary) hypertension: Secondary | ICD-10-CM

## 2020-05-13 DIAGNOSIS — I4891 Unspecified atrial fibrillation: Secondary | ICD-10-CM | POA: Diagnosis not present

## 2020-05-13 DIAGNOSIS — D509 Iron deficiency anemia, unspecified: Secondary | ICD-10-CM

## 2020-05-13 MED ORDER — LORAZEPAM 0.5 MG PO TABS
ORAL_TABLET | ORAL | 0 refills | Status: DC
Start: 2020-05-13 — End: 2020-08-03

## 2020-05-13 NOTE — Patient Instructions (Signed)
It was great to see you again today!  Congratulations on getting your COVID vaccine today Schedule nurse visit in 3 weeks for next dose  Prescribed medication to take prior to MRI Someone will need to drive you home from the appointment  Follow up with me in 6 months, sooner if needed  Be well, Dr. Ardelia Mems

## 2020-05-13 NOTE — Progress Notes (Signed)
  Date of Visit: 05/13/2020   SUBJECTIVE:   HPI:  Jesse Franklin presents today to discuss MRI scheduling and premedication.  Has MRI of liver ordered by hepatology NP. Needs a larger open MRI in order to be more comfortable. Also requests medication to help him relax. Was told to talk to me about getting this.  Hypertension - taking amlodipine 54m daily, toleratin gwell  afib - desires to continue warfarin, not interested in a DOAC  Anemia - taking ferrous sulfate 3236mdaily.  OBJECTIVE:   BP 110/70   Pulse 69   Ht 5' 6.5" (1.689 m)   Wt 175 lb 9.6 oz (79.7 kg)   SpO2 100%   BMI 27.92 kg/m  Gen: no acute distress, pleasant, cooperative HEENT: normocephalic, atraumatic  Lungs: normal work of breathing  Neuro: alert, speech normal, grossly nonfocal Ext: No appreciable lower extremity edema bilaterally   ASSESSMENT/PLAN:   Health maintenance:  -first dose of pfizer COVID vaccine given today -given stool cards for FIT testing (needs yearly per GI)  Anemia, iron deficiency Check iron studies and CBC today, will decide on further iron based on results  HYPERTENSION, BENIGN ESSENTIAL Well controlled. Continue current medication regimen.   Atrial fibrillation (HCCusterPrefers to continue warfarin. Managed by cardiology for iNR checks. Update CBC today.  Will ask staff to schedule MRI in an open scanner Given rx for ativan to take prior to his appointment He is aware someone will need to drive him  FOLLOW UP: Follow up in 6 mos for above issues  [name]. Jesse MemsMDTroy Franklin

## 2020-05-14 LAB — CBC
Hematocrit: 37.8 % (ref 37.5–51.0)
Hemoglobin: 13.6 g/dL (ref 13.0–17.7)
MCH: 33.2 pg — ABNORMAL HIGH (ref 26.6–33.0)
MCHC: 36 g/dL — ABNORMAL HIGH (ref 31.5–35.7)
MCV: 92 fL (ref 79–97)
Platelets: 141 10*3/uL — ABNORMAL LOW (ref 150–450)
RBC: 4.1 x10E6/uL — ABNORMAL LOW (ref 4.14–5.80)
RDW: 13.9 % (ref 11.6–15.4)
WBC: 3.5 10*3/uL (ref 3.4–10.8)

## 2020-05-14 LAB — BASIC METABOLIC PANEL
BUN/Creatinine Ratio: 7 — ABNORMAL LOW (ref 10–24)
BUN: 9 mg/dL (ref 8–27)
CO2: 21 mmol/L (ref 20–29)
Calcium: 9.5 mg/dL (ref 8.6–10.2)
Chloride: 107 mmol/L — ABNORMAL HIGH (ref 96–106)
Creatinine, Ser: 1.33 mg/dL — ABNORMAL HIGH (ref 0.76–1.27)
GFR calc Af Amer: 63 mL/min/{1.73_m2} (ref >59)
GFR calc non Af Amer: 55 mL/min/{1.73_m2} — ABNORMAL LOW (ref >59)
Glucose: 104 mg/dL — ABNORMAL HIGH (ref 65–99)
Potassium: 4 mmol/L (ref 3.5–5.2)
Sodium: 143 mmol/L (ref 134–144)

## 2020-05-14 LAB — IRON AND TIBC
Iron Saturation: 31 % (ref 15–55)
Iron: 84 ug/dL (ref 38–169)
Total Iron Binding Capacity: 269 ug/dL (ref 250–450)
UIBC: 185 ug/dL (ref 111–343)

## 2020-05-14 LAB — FERRITIN: Ferritin: 529 ng/mL — ABNORMAL HIGH (ref 30–400)

## 2020-05-18 NOTE — Assessment & Plan Note (Signed)
Well controlled. Continue current medication regimen.  

## 2020-05-18 NOTE — Assessment & Plan Note (Signed)
Check iron studies and CBC today, will decide on further iron based on results

## 2020-05-18 NOTE — Assessment & Plan Note (Signed)
Prefers to continue warfarin. Managed by cardiology for iNR checks. Update CBC today.

## 2020-05-21 ENCOUNTER — Encounter: Payer: Self-pay | Admitting: Family Medicine

## 2020-05-28 ENCOUNTER — Telehealth: Payer: Self-pay | Admitting: *Deleted

## 2020-05-28 NOTE — Telephone Encounter (Signed)
-----   Message from Leeanne Rio, MD sent at 05/28/2020 10:39 AM EDT ----- Jesse Franklin,  Can you help reschedule Jesse Franklin's MRI? It was ordered by his hepatologist. He needs an open MRI if possible, that's why he canceled the prior one. I normally would have punted this back to his liver doctor to deal with, but I agreed that we'd help since he agreed to his COVID vaccine (hahh).  I did give him a prescription for anxiety medicine prior to the MRI.  Thanks! Leeanne Rio, MD

## 2020-05-28 NOTE — Telephone Encounter (Signed)
LMOVM with MRI appt.  [facility] Oct 13 @ 11:50, must arrive at 11:20 Needs a driver Needs to fast 4hrs before Ok to take anxiety meds with a small sip of water 1 hr before.   Trenise Turay Kennon Holter, CMA

## 2020-05-31 DIAGNOSIS — D509 Iron deficiency anemia, unspecified: Secondary | ICD-10-CM | POA: Diagnosis not present

## 2020-06-01 LAB — FECAL OCCULT BLOOD, IMMUNOCHEMICAL: Fecal Occult Bld: NEGATIVE

## 2020-06-03 ENCOUNTER — Ambulatory Visit (INDEPENDENT_AMBULATORY_CARE_PROVIDER_SITE_OTHER): Payer: Medicare Other

## 2020-06-03 ENCOUNTER — Other Ambulatory Visit: Payer: Self-pay

## 2020-06-03 DIAGNOSIS — Z23 Encounter for immunization: Secondary | ICD-10-CM

## 2020-06-03 NOTE — Progress Notes (Signed)
   Covid-19 Vaccination Clinic  Name:  Jesse Franklin    MRN: [id number] DOB: Mar 02, 1951  06/03/2020   Patient presents to nurse clinic for second [place] vaccination. Patient denies previous allergic reaction to vaccine and answers no to all screening questions, except taking blood thinner. Advised patient that he may notice bruising to area. Held appropriate pressure following vaccine due to blood thinner. Administered in RD, site unremarkable, pt tolerated injection well.   Jesse Franklin was observed post Covid-19 immunization for 15 minutes without incident. He was provided with Vaccine Information Sheet and instruction to access the V-Safe system.   Jesse Franklin was instructed to call 911 with any severe reactions post vaccine: Marland Kitchen Difficulty breathing  . Swelling of face and throat  . A fast heartbeat  . A bad rash all over body  . Dizziness and weakness    Patient provided with updated immunization record and card.   Talbot Grumbling, RN

## 2020-06-04 ENCOUNTER — Ambulatory Visit (INDEPENDENT_AMBULATORY_CARE_PROVIDER_SITE_OTHER): Payer: Medicare Other | Admitting: Pharmacist

## 2020-06-04 DIAGNOSIS — Z5181 Encounter for therapeutic drug level monitoring: Secondary | ICD-10-CM

## 2020-06-04 DIAGNOSIS — I4891 Unspecified atrial fibrillation: Secondary | ICD-10-CM

## 2020-06-04 LAB — POCT INR: INR: 2.4 (ref 2.0–3.0)

## 2020-06-04 NOTE — Patient Instructions (Signed)
Description   Continue on same dosage 1 tablet everyday except 1.5 tablets on Mondays and Fridays.  Recheck INR in 8 weeks. Call if placed on any new medications [phone].

## 2020-06-16 NOTE — Progress Notes (Signed)
Cardiology Office Note  Date:  06/17/2020   ID:  Jesse Franklin, DOB 01/09/1951, MRN [id number]  PCP:  Leeanne Rio, MD  Cardiologist:  Dr. Acie Fredrickson _____________  6 month follow-up  _____________   History of Present Illness: Jesse Franklin is a 69 y.o. male with pmh of CAD, HLD, Afib, HTN, GERD, and tobacco use who presents today for cardiology evaluation. Patient has known CAD with distal LAD subtotal occlusion by heart cath in 2005. Echo in 05/2018 showed LVEF 60-65% will normal wall motiont, mild MR, mildly dilated LA.  Patient was last seen 12/2019 reporting rare chest pain. Rarely taking NTG SL. Still smoking.   Today, he reports he is doing well. He had a twinge of chest pain a couple months ago. He stood up from bending over and felt a little lightheaded with twinge of sharp chest pain. He took SL NTG x 1 and pain resolved. No SOB. He lives by himself and is able to perform ADLs. Most meals he cooks at home. He smokes 4-5 cigarhets daily. HE smoke mariajuana daily, says it helps keep the voices and visions away. Has seen behavioral health in the past and said he might have schizophrenia. He does not want to go back and see them. He regularly sees his PCP, has an appointment next month. He denies symptoms of palpitations,  shortness of breath, orthopnea, PND, lower extremity edema, claudication, dizziness, presyncope, syncope, bleeding, or neurologic sequela. The patient is tolerating medications without difficulties and is otherwise without complaint today. No bleeding issues.  _____________   Past Medical History:  Diagnosis Date   Alcohol abuse    Anemia    Atrial fibrillation (HCC)    BPH 11/08/2006   CAD (coronary artery disease)    Branch Vessel   Cocaine abuse ([place])    Hx of   GERD (gastroesophageal reflux disease)    Gout    Herpes    Herpes genitalia 04/13/2011   Hyperlipidemia    Hypertension    Myocardial infarction ([place]) 2005   RHINITIS,  ALLERGIC 11/08/2006   Tobacco abuse    Past Surgical History:  Procedure Laterality Date   CARDIAC CATHETERIZATION  08/2004   CARPAL TUNNEL RELEASE Right 04/04/2017   Procedure: RIGHT CARPAL TUNNEL RELEASE;  Surgeon: Leandrew Koyanagi, MD;  Location: [place];  Service: Orthopedics;  Laterality: Right;   LACERATION REPAIR Right 03/1988   "hand"   _____________  Current Outpatient Medications  Medication Sig Dispense Refill   allopurinol (ZYLOPRIM) 100 MG tablet Take 100 mg by mouth 2 (two) times daily.      amLODipine (NORVASC) 10 MG tablet Take 1 tablet (10 mg total) by mouth daily. 90 tablet 3   clopidogrel (PLAVIX) 75 MG tablet TAKE 1 TABLET BY MOUTH EVERY DAY 90 tablet 3   colchicine 0.6 MG tablet TAKE 1 TABLET BY MOUTH TWICE A DAY 180 tablet 0   famotidine (PEPCID) 20 MG tablet TAKE 1 TABLET BY MOUTH TWICE A DAY 180 tablet 3   LORazepam (ATIVAN) 0.5 MG tablet Take one tablet 30 minutes prior to MRI. Can take second dose immediately prior to MRI if needed. 2 tablet 0   nitroGLYCERIN (NITROSTAT) 0.4 MG SL tablet Place 1 tablet (0.4 mg total) under the tongue as directed. Place 1 tab under tongue as directed 25 tablet 6   pantoprazole (PROTONIX) 40 MG tablet Take 1 tablet (40 mg total) by mouth daily. 90 tablet 3   rosuvastatin (CRESTOR)  40 MG tablet Take 1 tablet (40 mg total) by mouth daily. 90 tablet 3   tamsulosin (FLOMAX) 0.4 MG CAPS capsule TAKE 1 CAPSULE BY MOUTH EVERY DAY 90 capsule 3   warfarin (COUMADIN) 5 MG tablet TAKE AS DIRECTED BY COUMADIN CLINIC 120 tablet 1   No current facility-administered medications for this visit.   Facility-Administered Medications Ordered in Other Visits  Medication Dose Route Frequency Provider Last Rate Last Admin   0.9 %  sodium chloride infusion   Intravenous Continuous Willeen Niece, MD 125 mL/hr at 07/31/14 1200 New Bag at 07/31/14 1200   _____________   Allergies:   Codeine and Ketorolac tromethamine   _____________   Social History:  The patient  reports that he has been smoking cigarettes. He has a 12.00 pack-year smoking history. He quit smokeless tobacco use about 52 years ago.  His smokeless tobacco use included chew. He reports current drug use. Drugs: "Crack" cocaine and Marijuana. He reports that he does not drink alcohol.  _____________   Family History:  The patient's family history includes Alcohol abuse in his brother and father; Arthritis in his brother; Cancer in his sister; Gout in his mother; Heart disease in his brother; Hypertension in his mother and sister.  _____________   ROS:  Please see the history of present illness.   Positive for rare chest pain,   All other systems are reviewed and negative.  _____________   PHYSICAL EXAM: VS:  BP 110/70    Ht 5' 6.5" (1.689 m)    Wt 176 lb 9.6 oz (80.1 kg)    SpO2 (!) 47%    BMI 28.08 kg/m  , BMI Body mass index is 28.08 kg/m. GEN: Well nourished, well developed, in no acute distress  HEENT: normal  Neck: no JVD, carotid bruits, or masses Cardiac: RR; bradycardia no murmurs, rubs, or gallops. No clubbing, cyanosis, edema.  Radials/DP/PT 2+ and equal bilaterally.  Respiratory:  clear to auscultation bilaterally, normal work of breathing GI: soft, nontender, nondistended, + BS MS: no deformity or atrophy  Skin: warm and dry, no rash Neuro:  Strength and sensation are intact Psych: euthymic mood, full affect _____________  EKG:   The ekg ordered today shows Junctional rhthym, 47bpm, TWI lateral leads, no significant changes since last tracing  Recent Labs: 12/19/2019: ALT 36 05/13/2020: BUN 9; Creatinine, Ser 1.33; Hemoglobin 13.6; Platelets 141; Potassium 4.0; Sodium 143  12/19/2019: Chol/HDL Ratio 3.0; Cholesterol, Total 118; HDL 40; LDL Chol Calc (NIH) 58; Triglycerides 106  CrCl cannot be calculated (Patient's most recent lab result is older than the maximum 21 days allowed.).  Wt Readings from Last 3 Encounters:   06/17/20 176 lb 9.6 oz (80.1 kg)  05/13/20 175 lb 9.6 oz (79.7 kg)  12/19/19 178 lb 12.8 oz (81.1 kg)    _____________   ASSESSMENT AND PLAN:  CAD with stable angina - Has rare chest pain, used SL NTG once in the last 6 months - continue plavix, statin. No bleeding issues - No BB with bradycardia - Told the patient to call the office if he's taking multiple NTG  Permanent Afib - rate controlled, junctional rhythym EKG today 47bpm,unchanged from prior - coumadin for a/c   HTN - BP today good at 110/70 - continue amlodipine  HLD - On rosuvastatin - LDL 58 02/2020. LFTs normal at that time  Tobacco abuse - patient still smoking 4-5 cigarettes daily.  - Also smoking marijuana daily   Disposition:   FU with  Dr. Acie Fredrickson in 6 months   Signed, Montee Tallman Ninfa Meeker, PA-C 06/17/2020 11:19 AM    _____________ [facility] [address] [place] [address]  [phone] (office) [phone] (fax)

## 2020-06-17 ENCOUNTER — Ambulatory Visit (INDEPENDENT_AMBULATORY_CARE_PROVIDER_SITE_OTHER): Payer: Medicare Other | Admitting: Medical

## 2020-06-17 ENCOUNTER — Encounter: Payer: Self-pay | Admitting: Physician Assistant

## 2020-06-17 ENCOUNTER — Other Ambulatory Visit: Payer: Self-pay

## 2020-06-17 VITALS — BP 110/70 | Ht 66.5 in | Wt 176.6 lb

## 2020-06-17 DIAGNOSIS — I10 Essential (primary) hypertension: Secondary | ICD-10-CM

## 2020-06-17 DIAGNOSIS — I4821 Permanent atrial fibrillation: Secondary | ICD-10-CM

## 2020-06-17 DIAGNOSIS — E785 Hyperlipidemia, unspecified: Secondary | ICD-10-CM | POA: Diagnosis not present

## 2020-06-17 DIAGNOSIS — Z720 Tobacco use: Secondary | ICD-10-CM | POA: Diagnosis not present

## 2020-06-17 DIAGNOSIS — I25119 Atherosclerotic heart disease of native coronary artery with unspecified angina pectoris: Secondary | ICD-10-CM

## 2020-06-17 NOTE — Patient Instructions (Addendum)

## 2020-06-23 ENCOUNTER — Ambulatory Visit
Admission: RE | Admit: 2020-06-23 | Discharge: 2020-06-23 | Disposition: A | Discharge status: Home / Self Care | Payer: Medicare Other | Source: Ambulatory Visit | Attending: Nurse Practitioner | Admitting: Nurse Practitioner

## 2020-06-23 DIAGNOSIS — K449 Diaphragmatic hernia without obstruction or gangrene: Secondary | ICD-10-CM | POA: Diagnosis not present

## 2020-06-23 DIAGNOSIS — K7689 Other specified diseases of liver: Secondary | ICD-10-CM

## 2020-06-23 DIAGNOSIS — N281 Cyst of kidney, acquired: Secondary | ICD-10-CM | POA: Diagnosis not present

## 2020-06-23 MED ORDER — GADOBENATE DIMEGLUMINE 529 MG/ML IV SOLN
15.0000 mL | Freq: Once | INTRAVENOUS | Status: AC | PRN
  Administered 2020-06-23: 15 mL via INTRAVENOUS

## 2020-07-19 ENCOUNTER — Other Ambulatory Visit: Payer: Self-pay | Admitting: Family Medicine

## 2020-07-30 ENCOUNTER — Ambulatory Visit (INDEPENDENT_AMBULATORY_CARE_PROVIDER_SITE_OTHER): Payer: Medicare Other

## 2020-07-30 ENCOUNTER — Other Ambulatory Visit: Payer: Self-pay

## 2020-07-30 ENCOUNTER — Encounter (INDEPENDENT_AMBULATORY_CARE_PROVIDER_SITE_OTHER): Payer: Self-pay

## 2020-07-30 DIAGNOSIS — Z5181 Encounter for therapeutic drug level monitoring: Secondary | ICD-10-CM

## 2020-07-30 DIAGNOSIS — I4891 Unspecified atrial fibrillation: Secondary | ICD-10-CM | POA: Diagnosis not present

## 2020-07-30 LAB — POCT INR: INR: 2.8 (ref 2.0–3.0)

## 2020-07-30 NOTE — Patient Instructions (Addendum)
  Description   Continue on same dosage 1 tablet everyday except 1.5 tablets on Mondays and Fridays.  Recheck INR in 8 weeks. Call if placed on any new medications [phone].

## 2020-08-03 ENCOUNTER — Ambulatory Visit (INDEPENDENT_AMBULATORY_CARE_PROVIDER_SITE_OTHER): Payer: Medicare Other | Admitting: Family Medicine

## 2020-08-03 ENCOUNTER — Other Ambulatory Visit: Payer: Self-pay

## 2020-08-03 ENCOUNTER — Encounter: Payer: Self-pay | Admitting: Family Medicine

## 2020-08-03 VITALS — BP 117/70 | HR 68 | Ht 67.0 in | Wt 180.4 lb

## 2020-08-03 DIAGNOSIS — I25119 Atherosclerotic heart disease of native coronary artery with unspecified angina pectoris: Secondary | ICD-10-CM | POA: Diagnosis not present

## 2020-08-03 DIAGNOSIS — I10 Essential (primary) hypertension: Secondary | ICD-10-CM | POA: Diagnosis not present

## 2020-08-03 DIAGNOSIS — D509 Iron deficiency anemia, unspecified: Secondary | ICD-10-CM

## 2020-08-03 MED ORDER — TETANUS-DIPHTH-ACELL PERTUSSIS 5-2.5-18.5 LF-MCG/0.5 IM SUSY: 0.5000 mL | PREFILLED_SYRINGE | Freq: Once | INTRAMUSCULAR | 0 refills | Status: AC

## 2020-08-03 NOTE — Progress Notes (Signed)
  Date of Visit: 08/03/2020   SUBJECTIVE:   HPI:  Jesse Franklin presents today for routine follow up.  Anemia - last visit iron studies were normal as was Hgb so I told him he could stop his iron, which he has. Due for recheck of CBC today. He feels well and has no complaints today.  CAD - followed regularly by cardiology. Has had to take nitroglycerin twice this year (cardiology aware of this and advised he follow up if needing to take it more regularly).  CKD - has appointment with nephrology in the new year. Agreeable to rechecking BMET today  OBJECTIVE:   BP 117/70   Pulse 68   Ht 5' 7"  (1.702 m)   Wt 180 lb 6.4 oz (81.8 kg)   SpO2 100%   BMI 28.25 kg/m  Gen: no acute distress, pleasant ocoperative HEENT: normocephalic, atraumatic  Heart: regular rate and rhythm, no murmur Lungs: clear to auscultation bilaterally, normal work of breathing  Neuro: alert speech normal grossly nonfocal  ASSESSMENT/PLAN:   Health maintenance:  -declines flu vaccine today -advised he is due for Tdap, gave rx to get at his pharmacy -also recommended pneumovax23 but patient declined for today, will continue to address at future visits. Encouraged him to get it when he gets his Tdap at pharmacy if he is willing  Elevated creatinine Recheck BMET today. Has nephro follow up scheduled  CAD (coronary artery disease) Stable, followed by cardiology. They previously instructed him to follow up if needing nitrglycerin repeatedly. Thus far only has needed twice in the last year. Reiterated he needs to follow up with cardiology if needs nitro repeatedly  Anemia, iron deficiency Now off iron. Update CBC today.  FOLLOW UP: Follow up in 6 mos for routine issues  [place] J. Ardelia Mems, [place]

## 2020-08-03 NOTE — Patient Instructions (Signed)
It was great to see you again today!  Checking kidneys and blood counts today  Take tetanus prescription to your pharmacy to get the vaccine Recommend you also get the pneumonia shot  Follow up with me in 6 months, sooner if needed  Be well, Dr. Ardelia Mems

## 2020-08-03 NOTE — Assessment & Plan Note (Signed)
Stable, followed by cardiology. They previously instructed him to follow up if needing nitrglycerin repeatedly. Thus far only has needed twice in the last year. Reiterated he needs to follow up with cardiology if needs nitro repeatedly

## 2020-08-03 NOTE — Assessment & Plan Note (Signed)
Now off iron. Update CBC today.

## 2020-08-04 ENCOUNTER — Encounter: Payer: Self-pay | Admitting: Family Medicine

## 2020-08-04 LAB — BASIC METABOLIC PANEL
BUN/Creatinine Ratio: 6 — ABNORMAL LOW (ref 10–24)
BUN: 8 mg/dL (ref 8–27)
CO2: 20 mmol/L (ref 20–29)
Calcium: 9.6 mg/dL (ref 8.6–10.2)
Chloride: 105 mmol/L (ref 96–106)
Creatinine, Ser: 1.26 mg/dL (ref 0.76–1.27)
GFR calc Af Amer: 68 mL/min/{1.73_m2} (ref >59)
GFR calc non Af Amer: 59 mL/min/{1.73_m2} — ABNORMAL LOW (ref >59)
Glucose: 96 mg/dL (ref 65–99)
Potassium: 3.7 mmol/L (ref 3.5–5.2)
Sodium: 141 mmol/L (ref 134–144)

## 2020-08-04 LAB — CBC
Hematocrit: 37.6 % (ref 37.5–51.0)
Hemoglobin: 13.5 g/dL (ref 13.0–17.7)
MCH: 33.3 pg — ABNORMAL HIGH (ref 26.6–33.0)
MCHC: 35.9 g/dL — ABNORMAL HIGH (ref 31.5–35.7)
MCV: 93 fL (ref 79–97)
Platelets: 142 10*3/uL — ABNORMAL LOW (ref 150–450)
RBC: 4.05 x10E6/uL — ABNORMAL LOW (ref 4.14–5.80)
RDW: 13.8 % (ref 11.6–15.4)
WBC: 3.6 10*3/uL (ref 3.4–10.8)

## 2020-09-24 ENCOUNTER — Ambulatory Visit (INDEPENDENT_AMBULATORY_CARE_PROVIDER_SITE_OTHER): Payer: Medicare Other

## 2020-09-24 ENCOUNTER — Other Ambulatory Visit: Payer: Self-pay

## 2020-09-24 DIAGNOSIS — I4891 Unspecified atrial fibrillation: Secondary | ICD-10-CM

## 2020-09-24 DIAGNOSIS — Z5181 Encounter for therapeutic drug level monitoring: Secondary | ICD-10-CM

## 2020-09-24 LAB — POCT INR: INR: 2.1 (ref 2.0–3.0)

## 2020-09-24 NOTE — Patient Instructions (Signed)
Description   Continue on same dosage 1 tablet everyday except 1.5 tablets on Mondays and Fridays.  Recheck INR in 8 weeks. Call if placed on any new medications [phone].

## 2020-09-26 ENCOUNTER — Other Ambulatory Visit: Payer: Self-pay | Admitting: Family Medicine

## 2020-09-26 ENCOUNTER — Other Ambulatory Visit: Payer: Self-pay | Admitting: Cardiovascular Disease

## 2020-09-26 DIAGNOSIS — I4891 Unspecified atrial fibrillation: Secondary | ICD-10-CM

## 2020-09-30 DIAGNOSIS — I129 Hypertensive chronic kidney disease with stage 1 through stage 4 chronic kidney disease, or unspecified chronic kidney disease: Secondary | ICD-10-CM | POA: Diagnosis not present

## 2020-09-30 DIAGNOSIS — I4891 Unspecified atrial fibrillation: Secondary | ICD-10-CM | POA: Diagnosis not present

## 2020-09-30 DIAGNOSIS — N2581 Secondary hyperparathyroidism of renal origin: Secondary | ICD-10-CM | POA: Diagnosis not present

## 2020-09-30 DIAGNOSIS — N182 Chronic kidney disease, stage 2 (mild): Secondary | ICD-10-CM | POA: Diagnosis not present

## 2020-09-30 DIAGNOSIS — R809 Proteinuria, unspecified: Secondary | ICD-10-CM | POA: Diagnosis not present

## 2020-09-30 DIAGNOSIS — M109 Gout, unspecified: Secondary | ICD-10-CM | POA: Diagnosis not present

## 2020-09-30 DIAGNOSIS — N179 Acute kidney failure, unspecified: Secondary | ICD-10-CM | POA: Diagnosis not present

## 2020-09-30 DIAGNOSIS — I272 Pulmonary hypertension, unspecified: Secondary | ICD-10-CM | POA: Diagnosis not present

## 2020-09-30 DIAGNOSIS — I2510 Atherosclerotic heart disease of native coronary artery without angina pectoris: Secondary | ICD-10-CM | POA: Diagnosis not present

## 2020-11-04 ENCOUNTER — Encounter: Payer: Self-pay | Admitting: Family Medicine

## 2020-11-04 ENCOUNTER — Ambulatory Visit (INDEPENDENT_AMBULATORY_CARE_PROVIDER_SITE_OTHER): Payer: Medicare Other | Admitting: Family Medicine

## 2020-11-04 ENCOUNTER — Ambulatory Visit (INDEPENDENT_AMBULATORY_CARE_PROVIDER_SITE_OTHER): Payer: Medicare Other

## 2020-11-04 ENCOUNTER — Other Ambulatory Visit: Payer: Self-pay

## 2020-11-04 VITALS — BP 110/72 | HR 52 | Wt 177.4 lb

## 2020-11-04 DIAGNOSIS — R7309 Other abnormal glucose: Secondary | ICD-10-CM | POA: Diagnosis not present

## 2020-11-04 DIAGNOSIS — M109 Gout, unspecified: Secondary | ICD-10-CM | POA: Diagnosis not present

## 2020-11-04 DIAGNOSIS — Z23 Encounter for immunization: Secondary | ICD-10-CM | POA: Diagnosis not present

## 2020-11-04 DIAGNOSIS — K219 Gastro-esophageal reflux disease without esophagitis: Secondary | ICD-10-CM | POA: Diagnosis not present

## 2020-11-04 DIAGNOSIS — I10 Essential (primary) hypertension: Secondary | ICD-10-CM

## 2020-11-04 DIAGNOSIS — F209 Schizophrenia, unspecified: Secondary | ICD-10-CM | POA: Diagnosis not present

## 2020-11-04 DIAGNOSIS — I25119 Atherosclerotic heart disease of native coronary artery with unspecified angina pectoris: Secondary | ICD-10-CM

## 2020-11-04 LAB — POCT GLYCOSYLATED HEMOGLOBIN (HGB A1C): Hemoglobin A1C: 5.4 % (ref 4.0–5.6)

## 2020-11-04 NOTE — Progress Notes (Signed)
   Covid-19 Vaccination Clinic  Name:  Jesse Franklin    MRN: [id number] DOB: 23-Dec-1950  11/04/2020  [name] was observed post Covid-19 immunization for 15 minutes without incident. He was provided with Vaccine Information Sheet and instruction to access the V-Safe system.   [name] was instructed to call 911 with any severe reactions post vaccine: Marland Kitchen Difficulty breathing  . Swelling of face and throat  . A fast heartbeat  . A bad rash all over body  . Dizziness and weakness

## 2020-11-04 NOTE — Patient Instructions (Addendum)
Keep working on quitting smoking.  COVID booster today  Call the Behavioral Medicine center to schedule an appointment with a psychiatrist. Their phone number is: [phone].   If you cannot get an appointment, please call us back so we can help connect you with a psychiatrist.  [place] National Suicide Hotline  [phone] (TALK)  Follow up with me in 3 months, sooner if needed.  Be well, Dr. Ardelia Mems

## 2020-11-04 NOTE — Progress Notes (Signed)
  Date of Visit: 11/04/2020   SUBJECTIVE:   HPI:  Jesse Franklin presents today for routine follow up.  Hypertension - currently taking amlodipine 93m daily.  Reports hearing voices telling him to hurt his dog. He does not want to hurt his dog and doesn't plan to. Hears and sees things. Denies thoughts of hurting himself or others. History of reported schizophrenia, not on medications in many years. Not currently established with psychiatry.  Gout - taking allopurinol 1058mand colchicine 0.11m85mwice daily, followed by rheum for gout  GERD - taking pepcid 59m52mice daily and protonix 40mg2mly.  CAD - taking plavix 75mg 59my and crestor 40mg d611m. Also has nitro rx in case he needs it. Followed by cardiology. Takes coumadin for afib, managed by cardiology clinic.  Had A1c checked by NP through his insurance who did a home visit and got 6.8 and advised him to follow up here.  Smoking 4-5 cigarettes per day, not interested in quitting  OBJECTIVE:   BP 110/72   Pulse (!) 52   Wt 177 lb 6.4 oz (80.5 kg)   SpO2 98%   BMI 27.78 kg/m  Gen: no acute distress, pleasant cooeprative HEENT: normocephalic, atraumatic  Heart: regular rate and rhythm, no murmur Lungs: clear to auscultation bilaterally, normal work of breathing  Neuro: alert, speech normal, grossly nonfocal Ext: No appreciable lower extremity edema bilaterally   ASSESSMENT/PLAN:   Health maintenance:  -COVID booster today -due for pneumonia vaccine, holding off today as we will soon have the 20 valent pneumococcal vaccine available, will plan to contact him to schedule RN clinic visit once it is available in our clinic  CAD (coronary artery disease) Doing well, following with cardiology regularly  HYPERTENSION, BENIGN ESSENTIAL Well controlled. Continue current medication regimen.   Schizophrenia (HCC) UnLilydalear whether schizophrenia is true diagnosis - this is an old diagnosis and he has not been on medications in many  years and has been stable. Given report of ongoing verbal and auditory hallucinations, want to get him in with psychiatry for further assessment Provided phone # to behavioral health to schedule with psychiatry He will contact us if hKoreais unable to get in there within a reasonable time frame Provided national suicide hotline number  GASTROESOPHAGEAL REFLUX, NO ESOPHAGITIS Stable, continue current medication regimen  Gout Following with rheumatology, stable overall  FOLLOW UP: Schedule with psychiatry Follow up with RN clinic once pneumonia 20 valent vaccine is in  BrittanTanzaniantyrArdelia Franklin

## 2020-11-06 ENCOUNTER — Encounter: Payer: Self-pay | Admitting: Family Medicine

## 2020-11-08 NOTE — Assessment & Plan Note (Signed)
Well controlled. Continue current medication regimen.  

## 2020-11-08 NOTE — Assessment & Plan Note (Signed)
Following with rheumatology, stable overall

## 2020-11-08 NOTE — Assessment & Plan Note (Addendum)
Unclear whether schizophrenia is true diagnosis - this is an old diagnosis and he has not been on medications in many years and has been stable. Given report of ongoing verbal and auditory hallucinations, want to get him in with psychiatry for further assessment Provided phone # to behavioral health to schedule with psychiatry He will contact us if he is unable to get in there within a reasonable time frame Provided national suicide hotline number

## 2020-11-08 NOTE — Assessment & Plan Note (Signed)
Stable, continue current medication regimen

## 2020-11-08 NOTE — Assessment & Plan Note (Signed)
Doing well, following with cardiology regularly

## 2020-11-19 ENCOUNTER — Other Ambulatory Visit: Payer: Self-pay

## 2020-11-19 ENCOUNTER — Ambulatory Visit (INDEPENDENT_AMBULATORY_CARE_PROVIDER_SITE_OTHER): Payer: Medicare Other | Admitting: *Deleted

## 2020-11-19 DIAGNOSIS — Z5181 Encounter for therapeutic drug level monitoring: Secondary | ICD-10-CM

## 2020-11-19 DIAGNOSIS — I4891 Unspecified atrial fibrillation: Secondary | ICD-10-CM | POA: Diagnosis not present

## 2020-11-19 LAB — POCT INR: INR: 2.4 (ref 2.0–3.0)

## 2020-11-19 NOTE — Patient Instructions (Signed)
Description   Continue on same dosage 1 tablet everyday except 1.5 tablets on Mondays and Fridays.  Recheck INR in 8 weeks. Call if placed on any new medications [phone].

## 2020-12-02 DIAGNOSIS — K760 Fatty (change of) liver, not elsewhere classified: Secondary | ICD-10-CM | POA: Insufficient documentation

## 2020-12-16 DIAGNOSIS — K7581 Nonalcoholic steatohepatitis (NASH): Secondary | ICD-10-CM | POA: Diagnosis not present

## 2020-12-16 DIAGNOSIS — K7689 Other specified diseases of liver: Secondary | ICD-10-CM | POA: Diagnosis not present

## 2020-12-16 DIAGNOSIS — R932 Abnormal findings on diagnostic imaging of liver and biliary tract: Secondary | ICD-10-CM | POA: Diagnosis not present

## 2020-12-17 ENCOUNTER — Other Ambulatory Visit: Payer: Self-pay | Admitting: Nurse Practitioner

## 2020-12-17 DIAGNOSIS — K7581 Nonalcoholic steatohepatitis (NASH): Secondary | ICD-10-CM

## 2020-12-31 ENCOUNTER — Other Ambulatory Visit: Payer: Self-pay | Admitting: Family Medicine

## 2020-12-31 NOTE — Telephone Encounter (Signed)
Called about iron therapy--he is not longer taking. All other medications refilled.  Dorris Singh, MD  Family Medicine Teaching Service

## 2021-01-02 ENCOUNTER — Encounter: Payer: Self-pay | Admitting: Cardiovascular Disease

## 2021-01-02 NOTE — Progress Notes (Signed)
Cardiology Office Note   Date:  01/03/2021   ID:  Jesse Franklin, Jesse Franklin 04/19/1951, MRN [id number]  PCP:  Leeanne Rio, MD  Cardiologist:   Mertie Moores, MD   Chief Complaint  Patient presents with  . Coronary Artery Disease  . Atrial Fibrillation   1. Atrial fibrillation -  2. Coronary artery disease RESULTS: 08/30/04 1. Left main: Normal.  2. LAD: Large vessel, tortuous in the mid and distal section with mild  luminal irregularities. The extreme distal portion of the LAD after the  trifurcation of the distal vessel has a subtotal occlusion at the  trifurcation tip. Vessel less than 0.5 mm at this juncture.  3. LXC: Codominant with mild luminal irregularities.  4. OM1 and OM2: Large vessels with mild luminal irregularities.  5. RCA: Tortuous, codominant with mild luminal irregularities, very faint  right to left filling collaterals seen at the apex.  6. LV: EF 50-55% with mild inferior apical hypokinesis. LVEDP was 35  mmHg.  7. Abdominal/distal aortogram showed mild tortuosity. No significant  aneurysms were seen. The distal aorta showed mild splaying to the left  at the area of the iliac bifurcation. No significant iliac or femoral  disease was noted.  3. Hypertension 4. 6. Moderate pulmonary hypertension by echo in 2010 ( ongoing cigarette smoking)  5. Hyperlipidemia   Jesse Franklin is a 70 yo with hx as noted above. He has occasional episodes of left arm tingling. These episodes last 1-2 seconds. They're not necessarily associated with exertion. The tingling seems to get better with arm movement. He also has occasional episodes of shortness of breath.  He walks on a regular basis. He walks approximately 1 mile a day. He still eats some salt and also eats fried and fast foods.  December 02, 2013:  Jesse Franklin is doing well. He denies any chest pain or shortness of breath. He walks about 15-20 minutes each day. He no longer works. He has cut down on his  salt usage.    [name] 5, 2016:   Jesse Franklin is a 70 y.o. male who presents for follow-up of his atrial fibrillation. He also has a history of hypertension  Still smoking  -   Nov. 1, 2016:  Doing well from a cardiac standpoint.  Having gout issues  in left elbow and hand.  No Cp or dyspnea.  Still smoking   January 03, 2016:    Jesse Franklin is seen today  No CP , some back pain  Still smoking    December 25, 2016  occasional cp .  No changes in his angina pattern Still smoking   July 27, 2017:  Staying active , no further CP or dyspnea .  Sells loose cigaretes.  $0.50 per cigarette   May 09, 2018:  Jesse Franklin was seen today for follow-up visit. History of coronary artery disease and atrial fibrillation. He remains on Coumadin. HR is slow (junctional escape rhythm) sees.  He is not having any episodes of weakness or dizziness.  He has occasional episodes of left-sided arm and left-sided shoulder pain. Last few seconds. No chest pain with walking.  He still smokes 3 to 4 cigarettes a day. Also smokes marijuana   He has known coronary artery disease.  He has a distal LAD subtotal occlusion by heart catheterization in 2005.   December 19, 2019: Jesse Franklin is seen back today for follow up visit for his CAD, atrial fib and HLD Has rare cp.   Does not feel like  his pain before his stent .   Last for 3-4 seconds.  Had to take NTG on 1 occasion.   Walks around some  Walking does not bring on the cp.  Typically occurs while he is sitting   Still smokes several cigarettes a day .   We discussed cessation efforts.   January 03, 2021:  Jesse Franklin is seen today for follow up of his CAD, atrial fib, HLD Hx of coronary stenting in the past  Rare chest pain . Left arm tingling ,  Last for several seconds  Not associated with exercise  Walks his every day .  1/4 mile a day  Still smoking 4-5 cigarettes a day    Past Medical History:  Diagnosis Date  . Alcohol abuse   . Anemia   . Atrial  fibrillation ([place])   . BPH 11/08/2006  . CAD (coronary artery disease)    [place]  . Cocaine abuse ([facility])    Hx of  . GERD (gastroesophageal reflux disease)   . Gout   . Herpes   . Herpes genitalia 04/13/2011  . Hyperlipidemia   . Hypertension   . Myocardial infarction ([place]) 2005  . RHINITIS, ALLERGIC 11/08/2006  . Tobacco abuse     Past Surgical History:  Procedure Laterality Date  . CARDIAC CATHETERIZATION  08/2004  . CARPAL TUNNEL RELEASE Right 04/04/2017   Procedure: RIGHT CARPAL TUNNEL RELEASE;  Surgeon: Leandrew Koyanagi, MD;  Location: [facility];  Service: Orthopedics;  Laterality: Right;  . LACERATION REPAIR Right 03/1988   "hand"     Current Outpatient Medications  Medication Sig Dispense Refill  . allopurinol (ZYLOPRIM) 100 MG tablet Take 100 mg by mouth daily.     Marland Kitchen amLODipine (NORVASC) 10 MG tablet TAKE 1 TABLET BY MOUTH EVERY DAY 90 tablet 3  . clopidogrel (PLAVIX) 75 MG tablet TAKE 1 TABLET BY MOUTH EVERY DAY 90 tablet 3  . colchicine 0.6 MG tablet TAKE 1 TABLET BY MOUTH TWICE A DAY 180 tablet 0  . famotidine (PEPCID) 20 MG tablet TAKE 1 TABLET BY MOUTH TWICE A DAY 180 tablet 1  . nitroGLYCERIN (NITROSTAT) 0.4 MG SL tablet Place 1 tablet (0.4 mg total) under the tongue as directed. Place 1 tab under tongue as directed 25 tablet 6  . pantoprazole (PROTONIX) 40 MG tablet TAKE 1 TABLET BY MOUTH EVERY DAY 90 tablet 3  . rosuvastatin (CRESTOR) 40 MG tablet TAKE 1 TABLET BY MOUTH EVERY DAY 90 tablet 3  . tamsulosin (FLOMAX) 0.4 MG CAPS capsule TAKE 1 CAPSULE BY MOUTH EVERY DAY 90 capsule 1  . warfarin (COUMADIN) 5 MG tablet TAKE AS DIRECTED BY COUMADIN CLINIC 120 tablet 1   No current facility-administered medications for this visit.   Facility-Administered Medications Ordered in Other Visits  Medication Dose Route Frequency Provider Last Rate Last Admin  . 0.9 %  sodium chloride infusion   Intravenous Continuous Willeen Niece, MD 125 mL/hr at 07/31/14  1200 New Bag at 07/31/14 1200    Allergies:   Codeine and Ketorolac tromethamine    Social History:  The patient  reports that he has been smoking cigarettes. He has a 12.00 pack-year smoking history. He quit smokeless tobacco use about 53 years ago.  His smokeless tobacco use included chew. He reports current drug use. Drugs: "Crack" cocaine and Marijuana. He reports that he does not drink alcohol.   Family History:  The patient's family history includes Alcohol abuse in his brother and  father; Arthritis in his brother; Cancer in his sister; Gout in his mother; Heart disease in his brother; Hypertension in his mother and sister.    ROS:   Noted in current history, otherwise review of systems is negative.  Physical Exam: Blood pressure 122/64, pulse 93, height 5' 7"  (1.702 m), weight 173 lb 6.4 oz (78.7 kg), SpO2 97 %.  GEN:  Well nourished, well developed in no acute distress HEENT: Normal NECK: No JVD; No carotid bruits LYMPHATICS: No lymphadenopathy CARDIAC: irreg. Irreg. ,  2/6 systolic murmur   RESPIRATORY:  Clear to auscultation without rales, wheezing or rhonchi  ABDOMEN: Soft, non-tender, non-distended MUSCULOSKELETAL:  No edema; No deformity  SKIN: Warm and dry NEUROLOGIC:  Alert and oriented x 3   EKG:        Recent Labs: 08/03/2020: BUN 8; Creatinine, Ser 1.26; Hemoglobin 13.5; Platelets 142; Potassium 3.7; Sodium 141    Lipid Panel    Component Value Date/Time   CHOL 118 12/19/2019 1141   TRIG 106 12/19/2019 1141   HDL 40 12/19/2019 1141   CHOLHDL 3.0 12/19/2019 1141   CHOLHDL 3.4 08/02/2016 0913   VLDL 26 08/02/2016 0913   LDLCALC 58 12/19/2019 1141   LDLDIRECT 95 05/28/2012 0925      Wt Readings from Last 3 Encounters:  01/03/21 173 lb 6.4 oz (78.7 kg)  11/04/20 177 lb 6.4 oz (80.5 kg)  08/03/20 180 lb 6.4 oz (81.8 kg)      Other studies Reviewed: Additional studies/ records that were reviewed today include: . Review of the above records  demonstrates:    ASSESSMENT AND PLAN:  1. Atrial fibrillation-    HR is slow .   No symptoms   2. Coronary artery disease Cath  08/30/04  .no angina  .Marland Kitchen  3. Hypertension - BP is well controlled.   Cont meds.     4.  Moderate pulmonary hypertension by echo in 2010 ( ongoing cigarette smoking) -. Advised smoking cessation    5. Hyperlipidemia -   Check labs today  Cont crestor 40 mg a day    Current medicines are reviewed at length with the patient today.  The patient does not have concerns regarding medicines.  The following changes have been made:  no change  Labs/ tests ordered today include:   Orders Placed This Encounter  Procedures  . ALT  . Basic metabolic panel  . Lipid panel    Disposition:       Mertie Moores, MD  01/03/2021 8:45 AM    [facility] [address], [place], [place]  [address] Phone: [phone]; Fax: [phone]

## 2021-01-03 ENCOUNTER — Encounter: Payer: Self-pay | Admitting: Cardiovascular Disease

## 2021-01-03 ENCOUNTER — Other Ambulatory Visit: Payer: Self-pay

## 2021-01-03 ENCOUNTER — Ambulatory Visit (INDEPENDENT_AMBULATORY_CARE_PROVIDER_SITE_OTHER): Payer: Medicare Other | Admitting: Cardiovascular Disease

## 2021-01-03 VITALS — BP 122/64 | HR 93 | Ht 67.0 in | Wt 173.4 lb

## 2021-01-03 DIAGNOSIS — I25119 Atherosclerotic heart disease of native coronary artery with unspecified angina pectoris: Secondary | ICD-10-CM

## 2021-01-03 DIAGNOSIS — I4811 Longstanding persistent atrial fibrillation: Secondary | ICD-10-CM

## 2021-01-03 LAB — BASIC METABOLIC PANEL
BUN/Creatinine Ratio: 6 — ABNORMAL LOW (ref 10–24)
BUN: 8 mg/dL (ref 8–27)
CO2: 23 mmol/L (ref 20–29)
Calcium: 10.1 mg/dL (ref 8.6–10.2)
Chloride: 106 mmol/L (ref 96–106)
Creatinine, Ser: 1.24 mg/dL (ref 0.76–1.27)
Glucose: 120 mg/dL — ABNORMAL HIGH (ref 65–99)
Potassium: 4.2 mmol/L (ref 3.5–5.2)
Sodium: 143 mmol/L (ref 134–144)
eGFR: 64 mL/min/{1.73_m2} (ref >59)

## 2021-01-03 LAB — LIPID PANEL
Chol/HDL Ratio: 2.8 ratio (ref 0.0–5.0)
Cholesterol, Total: 121 mg/dL (ref 100–199)
HDL: 44 mg/dL (ref >39)
LDL Chol Calc (NIH): 60 mg/dL (ref 0–99)
Triglycerides: 84 mg/dL (ref 0–149)
VLDL Cholesterol Cal: 17 mg/dL (ref 5–40)

## 2021-01-03 LAB — ALT: ALT: 34 IU/L (ref 0–44)

## 2021-01-03 NOTE — Patient Instructions (Signed)
Medication Instructions:  Your physician recommends that you continue on your current medications as directed. Please refer to the Current Medication list given to you today.  *If you need a refill on your cardiac medications before your next appointment, please call your pharmacy*   Lab Work: TODAY: Lipids, alt, bmet If you have labs (blood work) drawn today and your tests are completely normal, you will receive your results only by: Marland Kitchen MyChart Message (if you have MyChart) OR . A paper copy in the mail If you have any lab test that is abnormal or we need to change your treatment, we will call you to review the results.   Testing/Procedures: none   Follow-Up: At [facility], you and your health needs are our priority.  As part of our continuing mission to provide you with exceptional heart care, we have created designated Provider Care Teams.  These Care Teams include your primary Cardiologist (physician) and Advanced Practice Providers (APPs -  Physician Assistants and Nurse Practitioners) who all work together to provide you with the care you need, when you need it.  We recommend signing up for the patient portal called "MyChart".  Sign up information is provided on this After Visit Summary.  MyChart is used to connect with patients for Virtual Visits (Telemedicine).  Patients are able to view lab/test results, encounter notes, upcoming appointments, etc.  Non-urgent messages can be sent to your provider as well.   To learn more about what you can do with MyChart, go to [email or web address].    Your next appointment:   1 year(s)  The format for your next appointment:   In Person  Provider:   You may see Mertie Moores, MD or one of the following Advanced Practice Providers on your designated Care Team:    Richardson Dopp, PA-C  [place], [place]

## 2021-01-10 ENCOUNTER — Ambulatory Visit
Admission: RE | Admit: 2021-01-10 | Discharge: 2021-01-10 | Disposition: A | Discharge status: Home / Self Care | Payer: Medicare Other | Source: Ambulatory Visit | Attending: Nurse Practitioner | Admitting: Nurse Practitioner

## 2021-01-10 DIAGNOSIS — K7689 Other specified diseases of liver: Secondary | ICD-10-CM | POA: Diagnosis not present

## 2021-01-10 DIAGNOSIS — K7581 Nonalcoholic steatohepatitis (NASH): Secondary | ICD-10-CM

## 2021-01-10 DIAGNOSIS — K760 Fatty (change of) liver, not elsewhere classified: Secondary | ICD-10-CM | POA: Diagnosis not present

## 2021-01-14 ENCOUNTER — Ambulatory Visit (INDEPENDENT_AMBULATORY_CARE_PROVIDER_SITE_OTHER): Payer: Medicare Other

## 2021-01-14 ENCOUNTER — Other Ambulatory Visit: Payer: Self-pay

## 2021-01-14 DIAGNOSIS — I4891 Unspecified atrial fibrillation: Secondary | ICD-10-CM | POA: Diagnosis not present

## 2021-01-14 DIAGNOSIS — I4811 Longstanding persistent atrial fibrillation: Secondary | ICD-10-CM | POA: Diagnosis not present

## 2021-01-14 DIAGNOSIS — Z5181 Encounter for therapeutic drug level monitoring: Secondary | ICD-10-CM | POA: Diagnosis not present

## 2021-01-14 LAB — POCT INR: INR: 2.2 (ref 2.0–3.0)

## 2021-01-14 NOTE — Patient Instructions (Signed)
Continue on same dosage 1 tablet everyday except 1.5 tablets on Mondays and Fridays.  Recheck INR in 8 weeks. Call if placed on any new medications [phone].

## 2021-02-01 ENCOUNTER — Encounter: Payer: Self-pay | Admitting: Family Medicine

## 2021-02-01 ENCOUNTER — Ambulatory Visit (INDEPENDENT_AMBULATORY_CARE_PROVIDER_SITE_OTHER): Payer: Medicare Other | Admitting: Family Medicine

## 2021-02-01 ENCOUNTER — Other Ambulatory Visit: Payer: Self-pay

## 2021-02-01 VITALS — BP 138/82 | HR 51 | Wt 175.8 lb

## 2021-02-01 DIAGNOSIS — F209 Schizophrenia, unspecified: Secondary | ICD-10-CM

## 2021-02-01 DIAGNOSIS — Z23 Encounter for immunization: Secondary | ICD-10-CM

## 2021-02-01 DIAGNOSIS — E7800 Pure hypercholesterolemia, unspecified: Secondary | ICD-10-CM

## 2021-02-01 DIAGNOSIS — I10 Essential (primary) hypertension: Secondary | ICD-10-CM | POA: Diagnosis not present

## 2021-02-01 NOTE — Patient Instructions (Addendum)
It was great to see you again today!  Call the Behavioral Medicine center to schedule an appointment. Their phone number is: [phone].  When you're going through tough times, it's easy to feel lonely and overwhelmed. Remember that YOU ARE NOT ALONE and we at [facility] want to support you during this difficult time. You can always call one of the hotlines below to speak with someone if you are having thoughts of harming yourself or others. - National suicide hotline (440) 213-0502) - [place] (1-800-SUICIDE)  Pneumonia shot today Checking cholesterol  Follow up with me in 3 months, sooner if needed  Be well, Dr. Ardelia Mems

## 2021-02-01 NOTE — Progress Notes (Signed)
  Date of Visit: 02/01/2021   SUBJECTIVE:   HPI:  Jesse Franklin presents today for routine follow up.  Hyperlipidemia - taking crestor 36m daily. History of CAD. Compliant with plavix and coumadin as well, takes these for afib. Fasting today.  Mood - overall reports mood is stable. Denies active thoughts of hurting himself, no plan. Feels down sometimes but finds things to cheer himself up. Sometimes hears voices. Never saw psychiatry. Uses marijuana throughout the day to get the voices to go away.  Hypertension- taking amlodipine 14mdaily, tolerating well   OBJECTIVE:   BP 138/82   Pulse (!) 51   Wt 175 lb 12.8 oz (79.7 kg)   SpO2 99%   BMI 27.53 kg/m  Gen: no acute distress, pleasant, cooperative HEENT: normocephalic, atraumatic  Heart: regular rate and rhythm, no murmur Lungs: clear to auscultation bilaterally, normal work of breathing  Neuro: speech normal, grossly nonfocal Ext: No appreciable lower extremity edema bilaterally Psych: normal range of affect, well groomed, speech normal in rate and volume, normal eye contact . Does not appear to be responding to internal stimuli during visit.  ASSESSMENT/PLAN:   Health maintenance:  -prevnar 20 given today  Schizophrenia ([facility]Overall stable. Again encouraged follow up with psychiatry - I am not sure that schizophrenia is an accurate diagnosis given how well he has done for so long off medications. Provided phone # for scheduling with psych. Also gave suicide hotline information. No active SI, no plans to hurt himself or others.  HYPERCHOLESTEROLEMIA Update lipids today, continue crestor  HYPERTENSION, BENIGN ESSENTIAL Well controlled. Continue current medication regimen.   FOLLOW UP: Follow up in 3 mos for above issues Schedule with psychiatry  [name]. McArdelia MemsMDEast [place]

## 2021-02-02 ENCOUNTER — Encounter: Payer: Self-pay | Admitting: Family Medicine

## 2021-02-02 LAB — LIPID PANEL
Chol/HDL Ratio: 3.3 ratio (ref 0.0–5.0)
Cholesterol, Total: 138 mg/dL (ref 100–199)
HDL: 42 mg/dL (ref >39)
LDL Chol Calc (NIH): 76 mg/dL (ref 0–99)
Triglycerides: 108 mg/dL (ref 0–149)
VLDL Cholesterol Cal: 20 mg/dL (ref 5–40)

## 2021-02-07 NOTE — Assessment & Plan Note (Signed)
Update lipids today, continue crestor

## 2021-02-07 NOTE — Assessment & Plan Note (Signed)
Overall stable. Again encouraged follow up with psychiatry - I am not sure that schizophrenia is an accurate diagnosis given how well he has done for so long off medications. Provided phone # for scheduling with psych. Also gave suicide hotline information. No active SI, no plans to hurt himself or others.

## 2021-02-07 NOTE — Assessment & Plan Note (Signed)
Well controlled. Continue current medication regimen.  

## 2021-02-22 IMAGING — MR MRI ABDOMEN WITH AND WITHOUT CONTRAST
8 of 14 series · 23 of 48 positions shown · IV contrast (8ml eovist)
Comparison: Ultrasound 07/29/2018

CLINICAL DATA: Within the right lobe of liver there is a 9 mm T1
hyperintense and T2 hyperintense lesion which is favored to
represent

EXAM:
MRI ABDOMEN WITHOUT AND WITH CONTRAST
TECHNIQUE: Multiplanar multisequence MR imaging of the abdomen was performed
both before and after the administration of intravenous contrast.
CONTRAST:  8mL EOVIST GADOXETATE DISODIUM 0.25 MOL/L IV SOLN

[Series 3: cor haste · coronal · 5.0mm · 0.82mm/px · 1 of 41 slices shown]
[im 1/41]
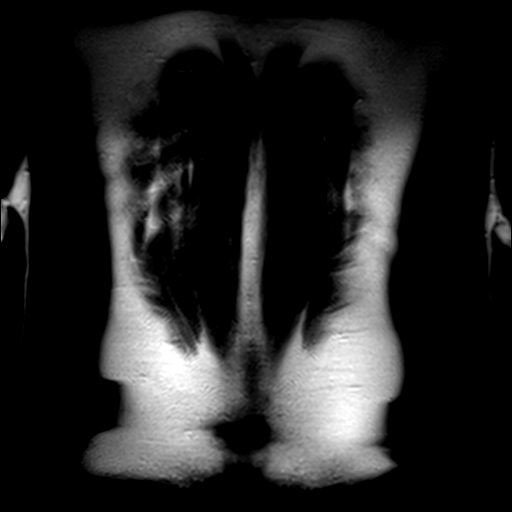

[Series 4: axial haste · axial · 5.0mm · 0.80mm/px · 1 of 44 slices shown]
[im 1/44]
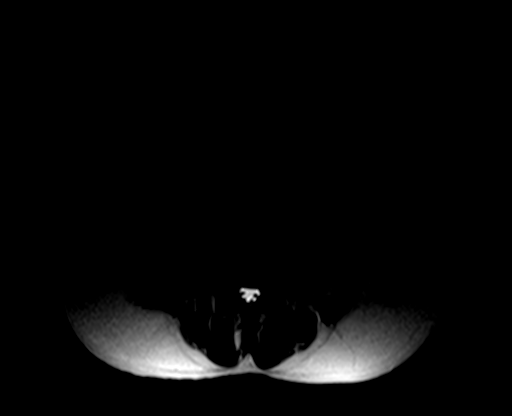

[Series 5: bSSFP · axial · 5.0mm · 0.80mm/px · z∈[-145,+100]mm · 2 of 50 slices shown]
[im 1/50]
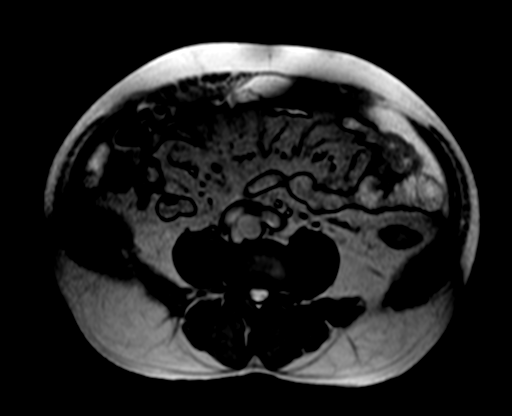
[im 50/50]
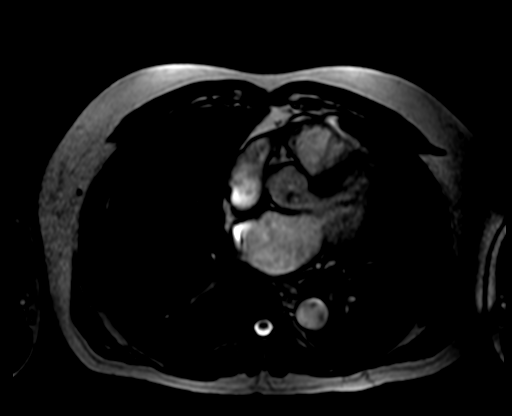

[Series 6: T1 · axial · 5.0mm · 0.80mm/px · z∈[-153,+105]mm · 4 of 88 slices shown]
[im 1/88]
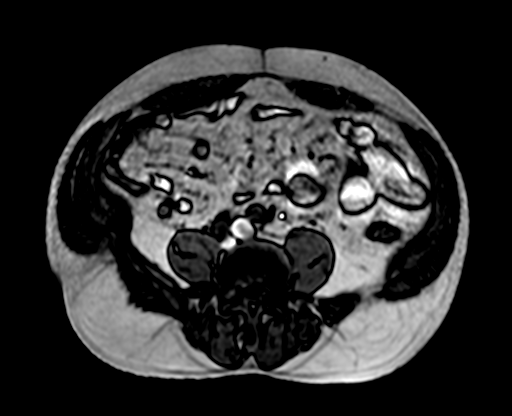
[im 30/88]
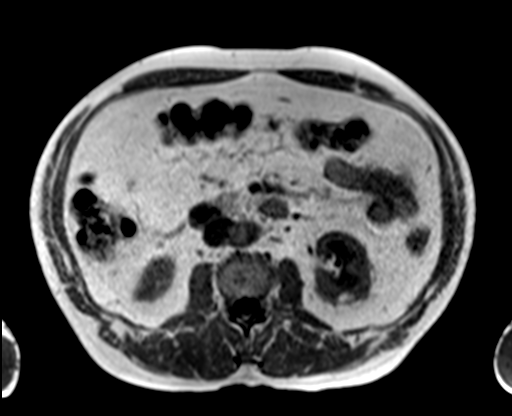
[im 59/88]
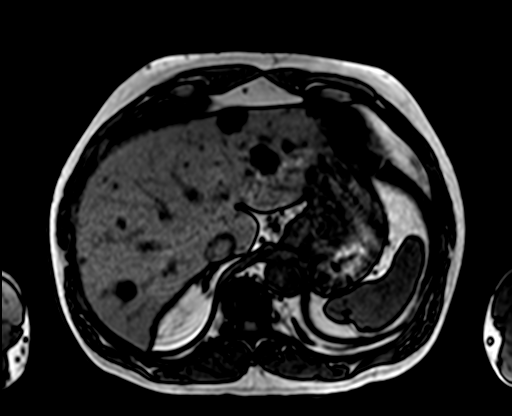
[im 88/88]
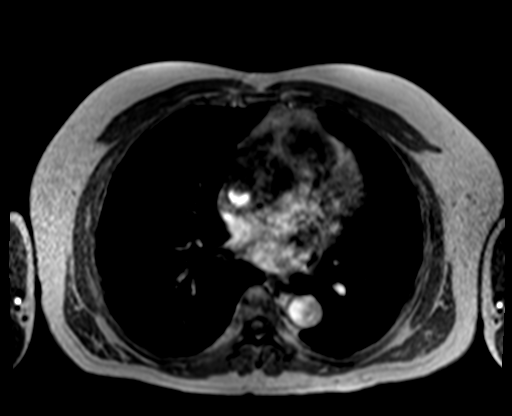

[Series 7: T1 dynamic · axial · non-contrast · 2.7mm · 0.80mm/px · z∈[-151,+105]mm · 4 of 96 slices shown]
[im 1/96]
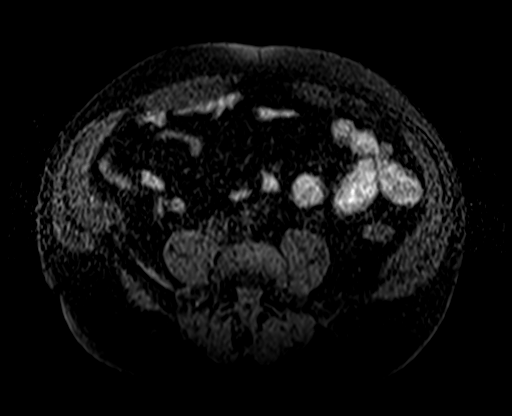
[im 32/96]
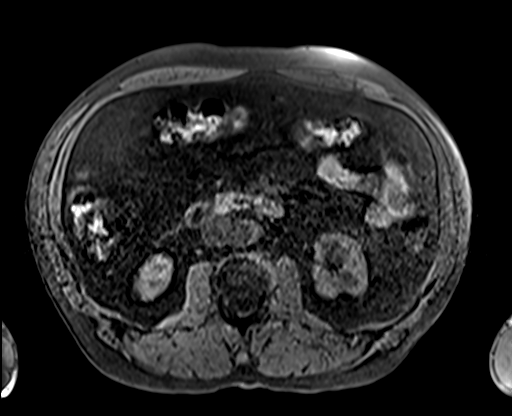
[im 64/96]
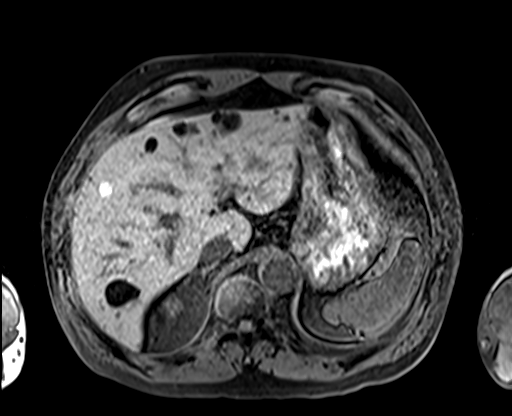
[im 96/96]
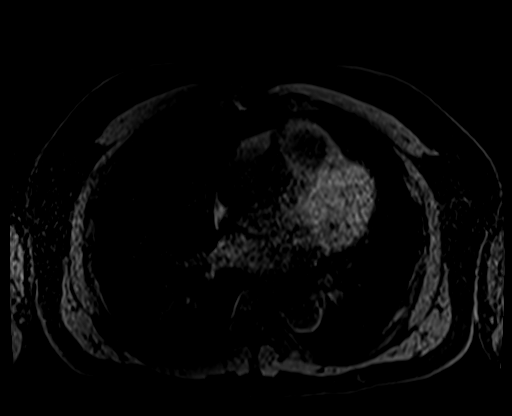

[Series 8: T1 dynamic post-contrast · axial · 2.7mm · 0.80mm/px · z∈[-151,+105]mm · 4 of 96 slices shown (1 of 3)]
[im 1/96]
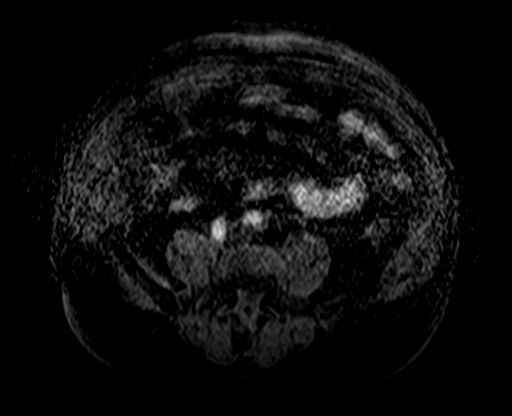
[im 32/96]
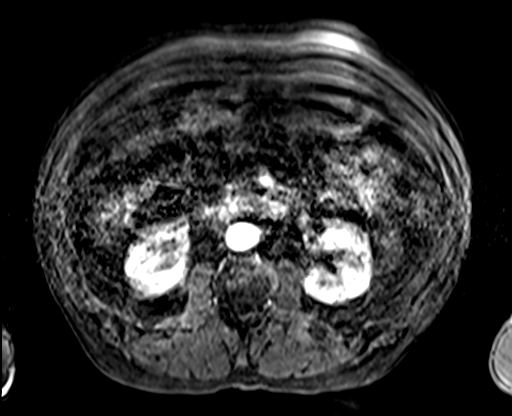
[im 64/96]
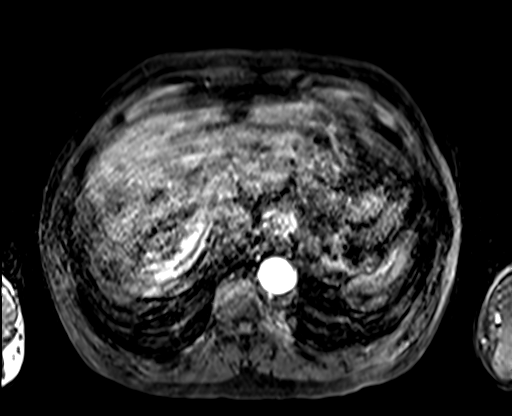
[im 96/96]
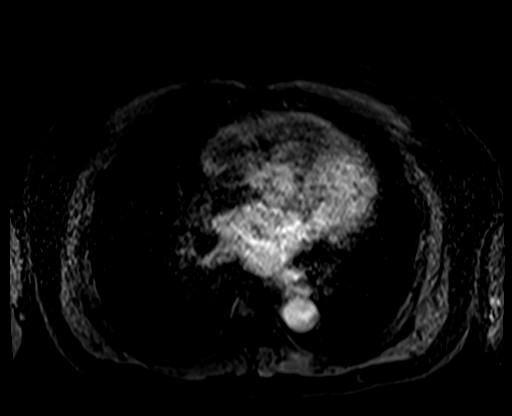

[Series 9: T1 dynamic post-contrast · axial · 2.7mm · 0.80mm/px · z∈[-151,+105]mm · 4 of 96 slices shown (2 of 3)]
[im 1/96]
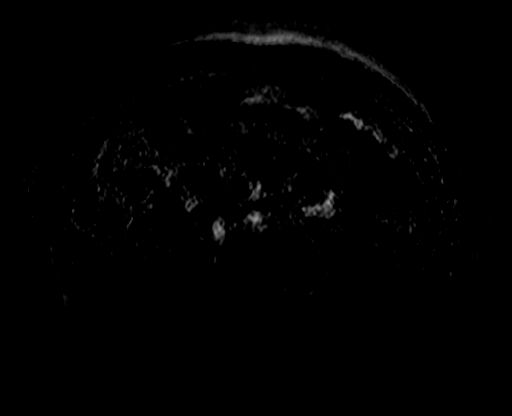
[im 32/96]
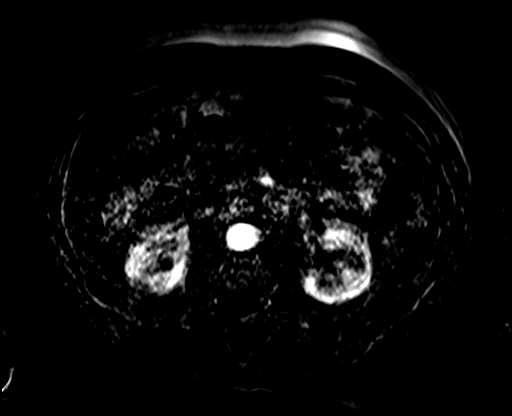
[im 64/96]
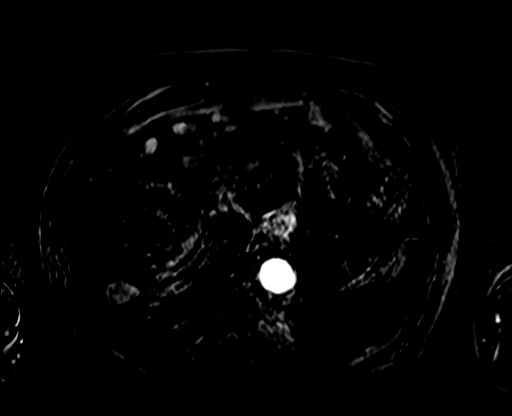
[im 96/96]
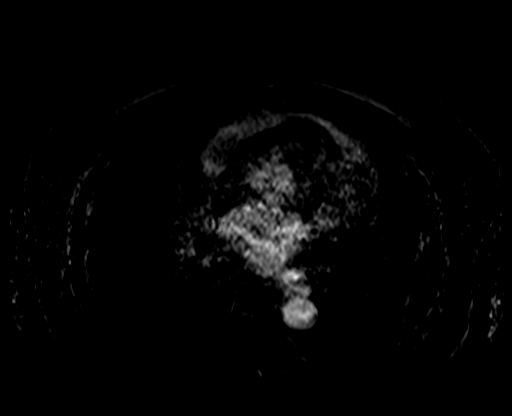

[Series 10: T1 dynamic post-contrast · axial · 2.7mm · 0.80mm/px · z∈[-151,+19]mm · 3 of 96 slices shown (3 of 3)]
[im 1/96]
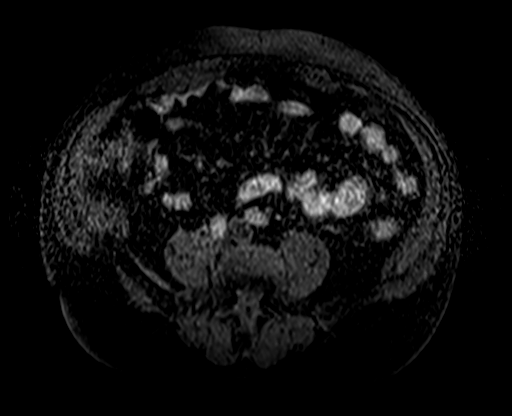
[im 32/96]
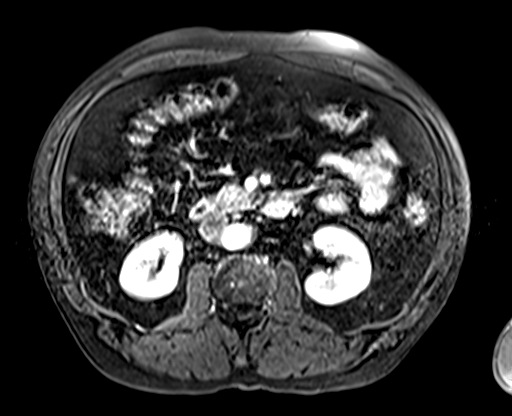
[im 64/96]
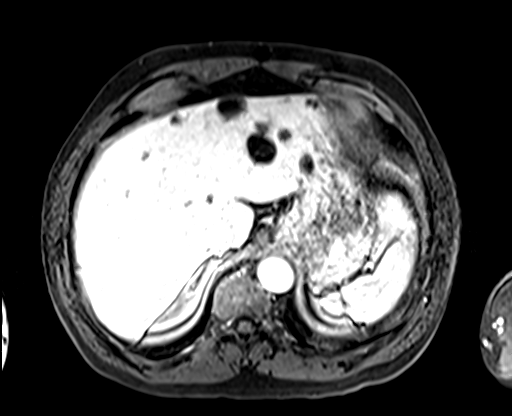

[23 of 48 positions shown; findings below may reference images not displayed]

FINDINGS: Lower chest: No acute findings.

Hepatobiliary: Innumerable liver cysts of varying sizes identified
throughout both lobes of liver. These measure up to 3.6 cm. T1 and
T2 hyperintense lesion in right lobe of liver measures 9 mm, image
34/7 and is favored to represent cyst complicated by hemorrhage.

The immediate post-contrast series is nondiagnostic secondary to
respiratory motion artifact. On the portal venous and delayed phase
images no suspicious areas of enhancement identified. Gallbladder
normal. No biliary dilatation.

Pancreas: No mass, inflammatory changes, or other parenchymal
abnormality identified.

Spleen:  Within normal limits in size and appearance.

Adrenals/Urinary Tract: Normal appearance of the adrenal glands.
Bilateral kidney cysts are identified. The largest arises from upper
pole of right kidney measuring 2.7 cm, image [DATE]. This cyst
contains several thin hairline septations without mural nodularity.
No solid enhancing mass identified. No hydronephrosis.

Stomach/Bowel: Visualized portions within the abdomen are
unremarkable.

Vascular/Lymphatic: No pathologically enlarged lymph nodes
identified. No abdominal aortic aneurysm demonstrated.

Other:  No free fluid or fluid collections

Musculoskeletal: No suspicious bone lesions identified.
IMPRESSION: 1. Multiple liver cysts. No suspicious enhancing liver lesions
identified. Within the right lobe there is a cyst which exhibits
abnormal increased T1 signal suggesting underlying hemorrhage.
2. Bilateral Bosniak category 1 and 2 kidney cysts.

## 2021-03-11 ENCOUNTER — Other Ambulatory Visit: Payer: Self-pay

## 2021-03-11 ENCOUNTER — Ambulatory Visit (INDEPENDENT_AMBULATORY_CARE_PROVIDER_SITE_OTHER): Payer: Medicare Other | Admitting: *Deleted

## 2021-03-11 DIAGNOSIS — Z01818 Encounter for other preprocedural examination: Secondary | ICD-10-CM | POA: Diagnosis not present

## 2021-03-11 DIAGNOSIS — I4891 Unspecified atrial fibrillation: Secondary | ICD-10-CM

## 2021-03-11 DIAGNOSIS — Z5181 Encounter for therapeutic drug level monitoring: Secondary | ICD-10-CM | POA: Diagnosis not present

## 2021-03-11 DIAGNOSIS — H25812 Combined forms of age-related cataract, left eye: Secondary | ICD-10-CM | POA: Diagnosis not present

## 2021-03-11 LAB — POCT INR: INR: 2.3 (ref 2.0–3.0)

## 2021-03-11 NOTE — Patient Instructions (Signed)
Description   Take 1 tablet today and then continue on the same dose of warfarin 1 tablet daily except for 1.5 tablets in Mondays and Fridays. Recheck INR in 8 weeks. Coumadin Clinic [phone].

## 2021-03-18 ENCOUNTER — Other Ambulatory Visit: Payer: Self-pay

## 2021-03-21 NOTE — Telephone Encounter (Signed)
Please clarify with patient whether he is still seeing the rheumatologist. They had been prescribing his colchicine. If he is no longer seeing them I can refill it, but if he is still seeing them the refill should come from their office. Thanks Leeanne Rio, MD

## 2021-03-22 NOTE — Telephone Encounter (Signed)
Patient reports that he has an appointment with  Rheumatology in August but he can not wait until then.  He is out now and needs them.  Ozella Almond, [place]

## 2021-03-23 MED ORDER — COLCHICINE 0.6 MG PO TABS: 0.6000 mg | ORAL_TABLET | Freq: Two times a day (BID) | ORAL | 0 refills | Status: DC

## 2021-03-28 DIAGNOSIS — H25812 Combined forms of age-related cataract, left eye: Secondary | ICD-10-CM | POA: Diagnosis not present

## 2021-03-28 DIAGNOSIS — H2512 Age-related nuclear cataract, left eye: Secondary | ICD-10-CM | POA: Diagnosis not present

## 2021-03-29 DIAGNOSIS — Z79899 Other long term (current) drug therapy: Secondary | ICD-10-CM | POA: Diagnosis not present

## 2021-03-29 DIAGNOSIS — M2550 Pain in unspecified joint: Secondary | ICD-10-CM | POA: Diagnosis not present

## 2021-03-29 DIAGNOSIS — M1A09X1 Idiopathic chronic gout, multiple sites, with tophus (tophi): Secondary | ICD-10-CM | POA: Diagnosis not present

## 2021-04-11 DIAGNOSIS — H2511 Age-related nuclear cataract, right eye: Secondary | ICD-10-CM | POA: Diagnosis not present

## 2021-04-11 DIAGNOSIS — H25811 Combined forms of age-related cataract, right eye: Secondary | ICD-10-CM | POA: Diagnosis not present

## 2021-04-19 ENCOUNTER — Other Ambulatory Visit: Payer: Self-pay

## 2021-04-19 ENCOUNTER — Ambulatory Visit (INDEPENDENT_AMBULATORY_CARE_PROVIDER_SITE_OTHER): Payer: Medicare Other | Admitting: Family Medicine

## 2021-04-19 ENCOUNTER — Encounter: Payer: Self-pay | Admitting: Family Medicine

## 2021-04-19 VITALS — BP 126/64 | HR 50 | Temp 98.6°F

## 2021-04-19 DIAGNOSIS — F209 Schizophrenia, unspecified: Secondary | ICD-10-CM | POA: Diagnosis not present

## 2021-04-19 DIAGNOSIS — M109 Gout, unspecified: Secondary | ICD-10-CM | POA: Diagnosis not present

## 2021-04-19 DIAGNOSIS — J069 Acute upper respiratory infection, unspecified: Secondary | ICD-10-CM | POA: Diagnosis not present

## 2021-04-19 NOTE — Assessment & Plan Note (Signed)
Doing well, continue current medications

## 2021-04-19 NOTE — Assessment & Plan Note (Signed)
Mood is overall stable with no SI/HI or AVH. Has not seen psychiatry but given current stability (plus questioning of actual dx of schizoprhrenia) I think it is reasonable to hold off on pushing him to see psych at this time. Continue to monitor.

## 2021-04-19 NOTE — Progress Notes (Signed)
  Date of Visit: 04/19/2021   SUBJECTIVE:   HPI:  Jesse Franklin presents today for routine follow up, but this was converted to a sick visit due to his current symptoms.  URI - began feeling sick this past Thursday 8/4 (currently day 5 of symptoms, counting 8/4 as day 0). Has had runny nose, sneezing, coughing. Felt sweaty and weak the first night and couldn't eat very well. Overall improving. Has taken nyquil. Has not gotten COVID tested. Started a new job in the last few months and has been around people without a mask.  Gout - taking allopurinol 144m daily and colchicine 0.657mtwice daily. Sees rheum for gout management, saw them recently, says labs were good.  Mood - has not seen psychiatry. Overall mood is good. Denies SI/HI. Denies AVH.  OBJECTIVE:   BP 126/64   Pulse (!) 50   Temp 98.6 F (37 C) (Oral)   SpO2 99%  Gen: no acute distress, pleasant, cooperative HEENT: normocephalic, atraumatic. Some nasal congestion present. Moist mucous membranes. No anterior cervical or supraclavicular lymphadenopathy.  Heart: regular rate and rhythm, no murmur Lungs: clear to auscultation bilaterally, normal work of breathing, speaks in full sentences without distress, occasional cough Neuro: alert, speech normal Psych: normal range of affect, well groomed, speech normal in rate and volume, normal eye contact   ASSESSMENT/PLAN:   Health maintenance:  -defer while acutely ill  Schizophrenia (HCHintonMood is overall stable with no SI/HI or AVH. Has not seen psychiatry but given current stability (plus questioning of actual dx of schizoprhrenia) I think it is reasonable to hold off on pushing him to see psych at this time. Continue to monitor.  Gout Doing well, continue current medications   Viral URI Overall well appearing Send COVID swab today Counseled to isolate until test has returned If positive, he will not be eligible for oral antiviral therapies as he is currently on day 5 of symptoms  and result will not be back until tomorrow  FOLLOW UP: Follow up with me in 3 months, sooner if needed  [name]. Jesse MemsMDMade[place]

## 2021-04-21 LAB — NOVEL CORONAVIRUS, NAA: SARS-CoV-2, NAA: NOT DETECTED

## 2021-04-21 LAB — SARS-COV-2, NAA 2 DAY TAT

## 2021-04-22 ENCOUNTER — Telehealth: Payer: Self-pay

## 2021-04-22 NOTE — Telephone Encounter (Signed)
Spoke to pt about scheduling AWV.  Pt stated that he prefers to schedule in office so he can see who he is speaking to. Informed pt that I would send a message about scheduling a F2F. Pt stated that he would prefer to see Dr. Ardelia Mems.   FYI: Last OV note with PCP stated pt needed a return appt around Nov for a 3 month follow up.

## 2021-05-06 ENCOUNTER — Ambulatory Visit (INDEPENDENT_AMBULATORY_CARE_PROVIDER_SITE_OTHER): Payer: Medicare Other

## 2021-05-06 ENCOUNTER — Other Ambulatory Visit: Payer: Self-pay

## 2021-05-06 DIAGNOSIS — Z5181 Encounter for therapeutic drug level monitoring: Secondary | ICD-10-CM | POA: Diagnosis not present

## 2021-05-06 DIAGNOSIS — I4891 Unspecified atrial fibrillation: Secondary | ICD-10-CM

## 2021-05-06 LAB — POCT INR: INR: 2 (ref 2.0–3.0)

## 2021-05-06 NOTE — Patient Instructions (Signed)
Description   Continue on the same dose of warfarin 1 tablet daily except for 1.5 tablets in Mondays and Fridays. Recheck INR in 8 weeks. Coumadin Clinic [phone].

## 2021-06-16 ENCOUNTER — Ambulatory Visit (INDEPENDENT_AMBULATORY_CARE_PROVIDER_SITE_OTHER): Payer: Medicare Other | Admitting: Family Medicine

## 2021-06-16 ENCOUNTER — Other Ambulatory Visit: Payer: Self-pay

## 2021-06-16 VITALS — BP 120/65 | HR 51 | Temp 99.0°F | Ht 67.0 in

## 2021-06-16 DIAGNOSIS — B349 Viral infection, unspecified: Secondary | ICD-10-CM | POA: Diagnosis not present

## 2021-06-16 DIAGNOSIS — J029 Acute pharyngitis, unspecified: Secondary | ICD-10-CM

## 2021-06-16 DIAGNOSIS — Z20822 Contact with and (suspected) exposure to covid-19: Secondary | ICD-10-CM

## 2021-06-16 LAB — POCT RAPID STREP A (OFFICE): Rapid Strep A Screen: NEGATIVE

## 2021-06-16 NOTE — Progress Notes (Signed)
    SUBJECTIVE:   CHIEF COMPLAINT / HPI:   Patient presents with non-productive cough, congestion, chills, sore throat, rhinorrhea, sneezing, fluctuating appetite and fever. He never took a temperature at home just warm to touch. Symptoms started about 3 days ago and seem to improve since it started. Denies diarrhea, headaches, myalgias or changes to activity level. Never had COVID before. Unsure if he received the COVID vaccine but states that he received flu vaccine 2 days ago. Denies any sick contacts, lives by himself with his dog. Denies dyspnea or chest pain.   OBJECTIVE:   BP 120/65   Pulse (!) 51   Temp 99 F (37.2 C)   Ht 5' 7"  (1.702 m)   SpO2 99%   BMI 27.53 kg/m   General: Patient well-appearing, in no acute distress. HEENT: no cervical LAD noted CV: RRR, no murmurs or gallops auscultated Resp: CTAB, mild congestion noted, no wheezing or rales noted, breathing comfortably on room air Abdomen: soft, nontender, nondistended, presence of bowel sounds Ext: radial pulses strong and equal bilaterally Derm: skin warm and dry to touch, no rashes or lesions noted Neuro: normal gait Psych: mood appropriate   ASSESSMENT/PLAN:   Viral illness -likely secondary to viral etiology, COVID testing pending and strep testing negative which was conveyed to patient during today's visit  -encouraged supportive care measures and safe social distancing practices  -return precautions discussed    Donney Dice, [place]

## 2021-06-16 NOTE — Assessment & Plan Note (Signed)
-  likely secondary to viral etiology, COVID testing pending and strep testing negative which was conveyed to patient during today's visit  -encouraged supportive care measures and safe social distancing practices  -return precautions discussed

## 2021-06-16 NOTE — Patient Instructions (Signed)
It was great seeing you today!  I am sorry that you are not feeling well. Your strep testing was negative. I will inform you if your COVID testing ends up being positive, if it is negative you will not hear from me. Please get rest and drink plenty of fluids. I am glad you are starting to feel better. If you have any trouble breathing, please go to the emergency department.   Please follow up at your next scheduled appointment, if anything arises between now and then, please don't hesitate to contact our office.   Thank you for allowing [place] to be a part of your medical care!  Thank you, Dr. Larae Grooms

## 2021-06-17 ENCOUNTER — Encounter (HOSPITAL_COMMUNITY): Payer: Self-pay | Admitting: Family Medicine

## 2021-06-17 LAB — NOVEL CORONAVIRUS, NAA: SARS-CoV-2, NAA: NOT DETECTED

## 2021-06-17 LAB — SARS-COV-2, NAA 2 DAY TAT

## 2021-06-20 ENCOUNTER — Other Ambulatory Visit: Payer: Self-pay | Admitting: Nurse Practitioner

## 2021-06-20 DIAGNOSIS — K7581 Nonalcoholic steatohepatitis (NASH): Secondary | ICD-10-CM

## 2021-06-20 DIAGNOSIS — K7402 Hepatic fibrosis, advanced fibrosis: Secondary | ICD-10-CM | POA: Diagnosis not present

## 2021-06-22 ENCOUNTER — Other Ambulatory Visit: Payer: Self-pay | Admitting: Family Medicine

## 2021-06-22 ENCOUNTER — Other Ambulatory Visit: Payer: Self-pay | Admitting: Cardiovascular Disease

## 2021-06-22 DIAGNOSIS — I4891 Unspecified atrial fibrillation: Secondary | ICD-10-CM

## 2021-06-22 NOTE — Telephone Encounter (Signed)
Prescription refill request received for warfarin [place]: 01/03/21 ([place]) Next INR check: 07/01/21 Warfarin tablet strength: 49m  Appropriate dose and refill sent to requested pharmacy.

## 2021-06-24 ENCOUNTER — Other Ambulatory Visit: Payer: Self-pay

## 2021-06-27 MED ORDER — TAMSULOSIN HCL 0.4 MG PO CAPS: 0.4000 mg | ORAL_CAPSULE | Freq: Every day | ORAL | 1 refills | Status: DC

## 2021-07-01 ENCOUNTER — Ambulatory Visit (INDEPENDENT_AMBULATORY_CARE_PROVIDER_SITE_OTHER): Payer: Medicare Other | Admitting: *Deleted

## 2021-07-01 ENCOUNTER — Other Ambulatory Visit: Payer: Self-pay

## 2021-07-01 DIAGNOSIS — Z5181 Encounter for therapeutic drug level monitoring: Secondary | ICD-10-CM

## 2021-07-01 DIAGNOSIS — I4891 Unspecified atrial fibrillation: Secondary | ICD-10-CM | POA: Diagnosis not present

## 2021-07-01 LAB — POCT INR: INR: 1.8 — AB (ref 2.0–3.0)

## 2021-07-01 NOTE — Patient Instructions (Signed)
Description   Take 2 tablets of warfarin then continue on the same dose of warfarin 1 tablet daily except for 1.5 tablets in Mondays and Fridays. Recheck INR in 4 weeks. Coumadin Clinic [phone].

## 2021-07-07 ENCOUNTER — Ambulatory Visit
Admission: RE | Admit: 2021-07-07 | Discharge: 2021-07-07 | Disposition: A | Discharge status: Home / Self Care | Payer: Medicare Other | Source: Ambulatory Visit | Attending: Nurse Practitioner | Admitting: Nurse Practitioner

## 2021-07-07 ENCOUNTER — Other Ambulatory Visit: Payer: Self-pay

## 2021-07-07 DIAGNOSIS — K7402 Hepatic fibrosis, advanced fibrosis: Secondary | ICD-10-CM

## 2021-07-07 DIAGNOSIS — K7581 Nonalcoholic steatohepatitis (NASH): Secondary | ICD-10-CM | POA: Diagnosis not present

## 2021-07-07 DIAGNOSIS — K7400 Hepatic fibrosis, unspecified: Secondary | ICD-10-CM | POA: Diagnosis not present

## 2021-07-14 ENCOUNTER — Ambulatory Visit (INDEPENDENT_AMBULATORY_CARE_PROVIDER_SITE_OTHER): Payer: Medicare Other | Admitting: Family Medicine

## 2021-07-14 ENCOUNTER — Ambulatory Visit (INDEPENDENT_AMBULATORY_CARE_PROVIDER_SITE_OTHER): Payer: Medicare Other

## 2021-07-14 ENCOUNTER — Other Ambulatory Visit: Payer: Self-pay

## 2021-07-14 VITALS — BP 119/60 | HR 46 | Ht 66.5 in | Wt 165.4 lb

## 2021-07-14 DIAGNOSIS — I10 Essential (primary) hypertension: Secondary | ICD-10-CM

## 2021-07-14 DIAGNOSIS — N401 Enlarged prostate with lower urinary tract symptoms: Secondary | ICD-10-CM

## 2021-07-14 DIAGNOSIS — I4811 Longstanding persistent atrial fibrillation: Secondary | ICD-10-CM

## 2021-07-14 DIAGNOSIS — Z23 Encounter for immunization: Secondary | ICD-10-CM | POA: Diagnosis not present

## 2021-07-14 DIAGNOSIS — M109 Gout, unspecified: Secondary | ICD-10-CM

## 2021-07-14 DIAGNOSIS — Z1211 Encounter for screening for malignant neoplasm of colon: Secondary | ICD-10-CM | POA: Diagnosis not present

## 2021-07-14 DIAGNOSIS — R351 Nocturia: Secondary | ICD-10-CM

## 2021-07-14 DIAGNOSIS — K219 Gastro-esophageal reflux disease without esophagitis: Secondary | ICD-10-CM | POA: Diagnosis not present

## 2021-07-14 MED ORDER — SHINGRIX 50 MCG/0.5ML IM SUSR: 0.5000 mL | Freq: Once | INTRAMUSCULAR | 1 refills | Status: AC

## 2021-07-14 NOTE — Patient Instructions (Addendum)
Bring back stool sample for [place] to test  COVID booster today  Take shingles vaccine to your pharmacy if you decide to get the shingles shots  Stay on current medications  Follow up with me in 6 months  Be well, Dr. Ardelia Mems

## 2021-07-14 NOTE — Assessment & Plan Note (Addendum)
Patient is doing well and has no complaints. Continue current medication regimen of warfarin and clopidogrel and following up with cardiology.

## 2021-07-14 NOTE — Assessment & Plan Note (Deleted)
Patient is overall doing well and coping well with his depression as it waxes and wanes. Counseled on possibility of therapy and medication, and he denies at this time. Discussed calling the office if his depression gets worse for further evaluation. Will continue follow up as normal.

## 2021-07-14 NOTE — Assessment & Plan Note (Addendum)
Patient is overall well without recent gout attacks. He is tolerating his colchicine and allopurinol well. Continue current regimen and following up with rheumatology.

## 2021-07-14 NOTE — Progress Notes (Signed)
    SUBJECTIVE:   CHIEF COMPLAINT / HPI:   Jesse Franklin (MRN: [id number]) is a 70 y.o. male with a history of HTN, atrial fibrillation, gout, GERD, BPH, and depression who presents for follow up.   HTN Patient is taking medications (amlodipine 32m daily) as prescribed. He is tolerating them well. He does not take his BP at home. He denies any episodes of lightheadedness, dizziness, chest pain, HA, or vision changes.  Atrial fibrillation Patient is followed by cardiology. He has no concerns and has not experienced any dizziness, lightheadedness, chest pain, or palpitations. Currently taking coumadin as anticoagulant.  Gout Patient denies any recent episodes of gout. He is taking his colchicine and allopurinol and tolerating them well.  GERD Patient's reflux symptoms are controlled on current dose of pantoprazole.  BPH Patient has been urinating well without any problems. His Flomax has been working well, and he is tolerating the medication well. He has no experienced any dizziness or lightheadedness while on the medication.   OBJECTIVE:   BP 119/60   Pulse (!) 46   Ht 5' 6.5" (1.689 m)   Wt 165 lb 6.4 oz (75 kg)   SpO2 98%   BMI 26.30 kg/m    PHYSICAL EXAM  GEN: Well-developed, in NAD HEAD: NCAT CVS: Bradycardic, irregularly irregular rhythm, normal S1/S2, no murmurs, rubs, gallops RESP: Breathing comfortably on RA, no retractions, wheezes, rhonchi, or crackles ABD: Non-distended EXT: Moves all extremities grossly equally   PSY: Mood and affect normal, thought content and process normal  ASSESSMENT/PLAN:   HYPERTENSION, BENIGN ESSENTIAL Patient's BP today was 119/60 and in target range. Given tolerance and stable BP, continue current regimen of Norvasc.  Atrial fibrillation ([facility] Patient is doing well and has no complaints. Continue current medication regimen of warfarin and clopidogrel and following up with cardiology.  Gout Patient is overall well without recent  gout attacks. He is tolerating his colchicine and allopurinol well. Continue current regimen and following up with rheumatology.  GASTROESOPHAGEAL REFLUX, NO ESOPHAGITIS Patient has not experienced any reflux symptoms on current dose of pantoprazole. Will continue current regimen given good control.  BPH (benign prostatic hyperplasia) Patient has not had any episodes of difficulty urinating or feeling of inadequate bladder emptying. Given his symptoms are stable and no orthostatic symptoms, will continue Flomax as prescribed.   Health maintenance -Patient received prescription for Shingrix vaccine to be obtained at his pharmacy.  -Also discussed colon cancer screening for which he is amenable, though patient was not given collection kit before departure. Will follow up at next visit. 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 I agree with the student's documentation and have made all necessary edits.  BChrisandra Netters MD  [facility]

## 2021-07-14 NOTE — Assessment & Plan Note (Addendum)
Patient's BP today was 119/60 and in target range. Given tolerance and stable BP, continue current regimen of Norvasc.

## 2021-07-14 NOTE — Assessment & Plan Note (Signed)
Patient has not had any episodes of difficulty urinating or feeling of inadequate bladder emptying. Given his symptoms are stable and no orthostatic symptoms, will continue Flomax as prescribed.

## 2021-07-14 NOTE — Assessment & Plan Note (Addendum)
Patient has not experienced any reflux symptoms on current dose of pantoprazole. Will continue current regimen given good control.

## 2021-07-18 ENCOUNTER — Other Ambulatory Visit: Payer: Self-pay | Admitting: Cardiovascular Disease

## 2021-07-18 DIAGNOSIS — I4891 Unspecified atrial fibrillation: Secondary | ICD-10-CM

## 2021-07-18 NOTE — Telephone Encounter (Signed)
Warfarin refill requested.  Last INR check 07/01/21. Refill sent in as requested.

## 2021-07-29 ENCOUNTER — Other Ambulatory Visit: Payer: Self-pay

## 2021-07-29 ENCOUNTER — Ambulatory Visit (INDEPENDENT_AMBULATORY_CARE_PROVIDER_SITE_OTHER): Payer: Medicare Other

## 2021-07-29 DIAGNOSIS — Z5181 Encounter for therapeutic drug level monitoring: Secondary | ICD-10-CM

## 2021-07-29 DIAGNOSIS — I4891 Unspecified atrial fibrillation: Secondary | ICD-10-CM

## 2021-07-29 LAB — POCT INR: INR: 1.9 — AB (ref 2.0–3.0)

## 2021-07-29 NOTE — Patient Instructions (Signed)
Take 2 tablets of warfarin then - START NEW DOSAGE of warfarin 1 tablet daily except for 1.5 tablets in MONDAYS, WEDNESDAYS, & FRIDAYS.  - Recheck INR in 4 weeks.  [facility] Clinic [phone].

## 2021-08-26 ENCOUNTER — Ambulatory Visit (INDEPENDENT_AMBULATORY_CARE_PROVIDER_SITE_OTHER): Payer: Medicare Other

## 2021-08-26 ENCOUNTER — Encounter (INDEPENDENT_AMBULATORY_CARE_PROVIDER_SITE_OTHER): Payer: Self-pay

## 2021-08-26 ENCOUNTER — Other Ambulatory Visit: Payer: Self-pay

## 2021-08-26 DIAGNOSIS — Z5181 Encounter for therapeutic drug level monitoring: Secondary | ICD-10-CM | POA: Diagnosis not present

## 2021-08-26 DIAGNOSIS — I4891 Unspecified atrial fibrillation: Secondary | ICD-10-CM | POA: Diagnosis not present

## 2021-08-26 LAB — POCT INR: INR: 2.2 (ref 2.0–3.0)

## 2021-08-26 NOTE — Patient Instructions (Signed)
Description   Continue on same dosage of warfarin 1 tablet daily except for 1.5 tablets in MONDAYS, WEDNESDAYS, & FRIDAYS.  Recheck INR in 6 weeks.  [facility]in Clinic [phone].

## 2021-09-07 ENCOUNTER — Other Ambulatory Visit: Payer: Self-pay | Admitting: Family Medicine

## 2021-09-14 ENCOUNTER — Ambulatory Visit (INDEPENDENT_AMBULATORY_CARE_PROVIDER_SITE_OTHER): Payer: Commercial Managed Care - HMO | Admitting: Family Medicine

## 2021-09-14 ENCOUNTER — Other Ambulatory Visit: Payer: Self-pay

## 2021-09-14 VITALS — Ht 67.0 in

## 2021-09-14 DIAGNOSIS — R310 Gross hematuria: Secondary | ICD-10-CM

## 2021-09-14 LAB — POCT UA - MICROSCOPIC ONLY

## 2021-09-14 LAB — POCT URINALYSIS DIP (MANUAL ENTRY)
Glucose, UA: NEGATIVE mg/dL
Ketones, POC UA: NEGATIVE mg/dL
Nitrite, UA: NEGATIVE
Protein Ur, POC: 100 mg/dL — AB
Spec Grav, UA: 1.015 (ref 1.010–1.025)
Urobilinogen, UA: 1 E.U./dL
pH, UA: 6 (ref 5.0–8.0)

## 2021-09-14 NOTE — Progress Notes (Signed)
° ° °  SUBJECTIVE:   CHIEF COMPLAINT / HPI:   Jesse Franklin 71 yo M who presents for the below.   Blood in urine Started last week Wednesday or Thursday.  Saw clots but this usually clears up when drinking water.  In the past he was told that statin medications may cause blood in his urine.  He denies having to strain with urination or frequent nighttime trips.  However he endorsed increased urinary frequency while at home.  While in public he does not use the restroom that often.  He did denies straining with urination and excessive nighttime urination.  He does however endorse significant weight loss.  Denies fevers.  PERTINENT  PMH / PSH: A. fib, HTN, BPH, proteinuria  OBJECTIVE:   Ht 5' 7"  (1.702 m)    BMI 25.91 kg/m   Results for orders placed or performed in visit on 09/14/21 (from the past 24 hour(s))  POCT urinalysis dipstick     Status: Abnormal   Collection Time: 09/14/21  9:30 AM  Result Value Ref Range   Color, UA red (A) yellow   Clarity, UA cloudy (A) clear   Glucose, UA negative negative mg/dL   Bilirubin, UA small (A) negative   Ketones, POC UA negative negative mg/dL   Spec Grav, UA 1.015 1.010 - 1.025   Blood, UA large (A) negative   pH, UA 6.0 5.0 - 8.0   Protein Ur, POC =100 (A) negative mg/dL   Urobilinogen, UA 1.0 0.2 or 1.0 E.U./dL   Nitrite, UA Negative Negative   Leukocytes, UA Trace (A) Negative  Physical Exam Vitals reviewed.  Constitutional:      General: He is not in acute distress.    Appearance: He is not ill-appearing or toxic-appearing.  Abdominal:     General: Abdomen is flat.     Palpations: Abdomen is soft. There is no hepatomegaly, splenomegaly or mass.     Tenderness: There is no abdominal tenderness. There is no right CVA tenderness or left CVA tenderness.  Neurological:     Mental Status: He is alert and oriented to person, place, and time.  Psychiatric:        Mood and Affect: Mood normal.        Behavior: Behavior normal.    ASSESSMENT/PLAN:   Gross hematuria Obviously bloody urine today.  Has a history of gross hematuria as well.  Will send urine culture.  He follows with nephrology.  Due to symptom of weight loss we will go ahead and refer to urology.  I did discuss with patient that I am concerned about this weight loss along with hematuria and my concern is bladder cancer.  I did review colonoscopy/FOBT which were negative.  Will consider making sure patient is up-to-date on other necessary cancer screenings at follow-up. He voiced understanding he is concerned as well and is amenable to going to the urologist.  ED precautions given. - Urine Culture - Ambulatory referral to Urology   Gerlene Fee, [place]

## 2021-09-14 NOTE — Patient Instructions (Signed)
Thank you for coming in today.  You have gross hematuria which is blood in the urine.  Today we will culture your urine to look for any infection.  If there is any sign of infection I will give you a call and send in treatment.  I have also referred you to a urologist for further work-up.  If you develop any fever, back pain, chills with blood in the urine please go to the emergency department.  Dr. Janus Molder

## 2021-09-16 LAB — URINE CULTURE: Organism ID, Bacteria: NO GROWTH

## 2021-09-20 ENCOUNTER — Other Ambulatory Visit: Payer: Self-pay | Admitting: Cardiovascular Disease

## 2021-10-07 ENCOUNTER — Other Ambulatory Visit: Payer: Self-pay

## 2021-10-07 ENCOUNTER — Ambulatory Visit (INDEPENDENT_AMBULATORY_CARE_PROVIDER_SITE_OTHER): Payer: Medicare Other | Admitting: *Deleted

## 2021-10-07 DIAGNOSIS — I4891 Unspecified atrial fibrillation: Secondary | ICD-10-CM

## 2021-10-07 DIAGNOSIS — Z5181 Encounter for therapeutic drug level monitoring: Secondary | ICD-10-CM | POA: Diagnosis not present

## 2021-10-07 LAB — POCT INR: INR: 2.2 (ref 2.0–3.0)

## 2021-10-07 NOTE — Patient Instructions (Signed)
Description   Continue on same dosage of warfarin 1 tablet daily except for 1.5 tablets in [place].  Recheck INR in 6 weeks.  Coumadin Clinic [phone].

## 2021-10-14 DIAGNOSIS — R310 Gross hematuria: Secondary | ICD-10-CM | POA: Diagnosis not present

## 2021-10-17 DIAGNOSIS — H905 Unspecified sensorineural hearing loss: Secondary | ICD-10-CM | POA: Diagnosis not present

## 2021-10-27 ENCOUNTER — Other Ambulatory Visit: Payer: Self-pay

## 2021-10-27 MED ORDER — FAMOTIDINE 20 MG PO TABS: 20.0000 mg | ORAL_TABLET | Freq: Two times a day (BID) | ORAL | 1 refills | Status: DC

## 2021-10-31 DIAGNOSIS — I2510 Atherosclerotic heart disease of native coronary artery without angina pectoris: Secondary | ICD-10-CM | POA: Diagnosis not present

## 2021-10-31 DIAGNOSIS — N179 Acute kidney failure, unspecified: Secondary | ICD-10-CM | POA: Diagnosis not present

## 2021-10-31 DIAGNOSIS — N2581 Secondary hyperparathyroidism of renal origin: Secondary | ICD-10-CM | POA: Diagnosis not present

## 2021-10-31 DIAGNOSIS — R809 Proteinuria, unspecified: Secondary | ICD-10-CM | POA: Diagnosis not present

## 2021-10-31 DIAGNOSIS — M109 Gout, unspecified: Secondary | ICD-10-CM | POA: Diagnosis not present

## 2021-10-31 DIAGNOSIS — I272 Pulmonary hypertension, unspecified: Secondary | ICD-10-CM | POA: Diagnosis not present

## 2021-10-31 DIAGNOSIS — N182 Chronic kidney disease, stage 2 (mild): Secondary | ICD-10-CM | POA: Diagnosis not present

## 2021-10-31 DIAGNOSIS — I129 Hypertensive chronic kidney disease with stage 1 through stage 4 chronic kidney disease, or unspecified chronic kidney disease: Secondary | ICD-10-CM | POA: Diagnosis not present

## 2021-10-31 DIAGNOSIS — I4891 Unspecified atrial fibrillation: Secondary | ICD-10-CM | POA: Diagnosis not present

## 2021-11-02 DIAGNOSIS — R310 Gross hematuria: Secondary | ICD-10-CM | POA: Diagnosis not present

## 2021-11-02 DIAGNOSIS — N2889 Other specified disorders of kidney and ureter: Secondary | ICD-10-CM | POA: Diagnosis not present

## 2021-11-02 DIAGNOSIS — K7689 Other specified diseases of liver: Secondary | ICD-10-CM | POA: Diagnosis not present

## 2021-11-02 DIAGNOSIS — N3289 Other specified disorders of bladder: Secondary | ICD-10-CM | POA: Diagnosis not present

## 2021-11-15 ENCOUNTER — Other Ambulatory Visit: Payer: Self-pay

## 2021-11-15 ENCOUNTER — Ambulatory Visit (INDEPENDENT_AMBULATORY_CARE_PROVIDER_SITE_OTHER): Payer: Medicare Other | Admitting: Family Medicine

## 2021-11-15 DIAGNOSIS — I10 Essential (primary) hypertension: Secondary | ICD-10-CM | POA: Diagnosis not present

## 2021-11-15 DIAGNOSIS — M109 Gout, unspecified: Secondary | ICD-10-CM

## 2021-11-15 NOTE — Patient Instructions (Signed)
It was great to see you again today! ? ?Bring back stool cards ?Check if you are taking amlodipine for your blood pressure  ? ?Follow up with me in 6 months, sooner if needed ? ?Be well, ?Dr. Ardelia Mems  ?

## 2021-11-15 NOTE — Progress Notes (Signed)
?  Date of Visit: 11/15/2021  ? ?SUBJECTIVE:  ? ?HPI: ? ?Jesse Franklin presents today for routine follow up. ? ?Gout - taking allopurinol and colchicine. Pharmacist made note of interaction between his colchicine and rosuvastatin. Patient denies any persistent muscular pain. Overall doing well, sees rheum for gout management. ? ?Hypertension - patient unsure if he is taking his amlodipine; thinks he is but did not bring with him today. ? ?Hematuria - has not noticed any further gross hematuria. He has an appointment with urology this coming week.  ? ?OBJECTIVE:  ? ?BP 126/82   Pulse (!) 49   Ht 5' 7"  (1.702 m)   Wt 163 lb 6.4 oz (74.1 kg)   SpO2 100%   BMI 25.59 kg/m?  ?Gen: no acute distress, pleasant cooperative ?HEENT: normocephalic, atraumatic  ?Lungs: normal work of breathing  ?Neuro: alert speech normal grossly nonfocal ? ?ASSESSMENT/PLAN:  ? ?Health maintenance:  ?-given FOBT card to complete & return (gets this annually per GI) ? ?Gout ?Doing well. Reassured patient regarding potential for interaction between colchicine and statin - advised letting us know ASAP if he has muscle pains. Ok to continue at present as he is asymptomatic and tolerating well. ? ?HYPERTENSION, BENIGN ESSENTIAL ?Unclear if on norvasc. Patient to check on this when he gets home. Blood pressure controlled today in any case. ? ?Gross hematuria ?Await input from urology visit later this week. ? ?FOLLOW UP: ?Follow up in 6 months for routine medical problems, sooner if needed ? ?[place]. Ardelia Mems, MD ?[facility] ?

## 2021-11-16 NOTE — Assessment & Plan Note (Signed)
Unclear if on norvasc. Patient to check on this when he gets home. Blood pressure controlled today in any case. ?

## 2021-11-16 NOTE — Assessment & Plan Note (Signed)
Doing well. Reassured patient regarding potential for interaction between colchicine and statin - advised letting us know ASAP if he has muscle pains. Ok to continue at present as he is asymptomatic and tolerating well. ?

## 2021-11-17 DIAGNOSIS — R310 Gross hematuria: Secondary | ICD-10-CM | POA: Diagnosis not present

## 2021-11-18 ENCOUNTER — Other Ambulatory Visit: Payer: Self-pay

## 2021-11-18 ENCOUNTER — Ambulatory Visit (INDEPENDENT_AMBULATORY_CARE_PROVIDER_SITE_OTHER): Payer: Medicare Other

## 2021-11-18 DIAGNOSIS — Z5181 Encounter for therapeutic drug level monitoring: Secondary | ICD-10-CM | POA: Diagnosis not present

## 2021-11-18 DIAGNOSIS — I4891 Unspecified atrial fibrillation: Secondary | ICD-10-CM | POA: Diagnosis not present

## 2021-11-18 LAB — POCT INR: INR: 2.3 (ref 2.0–3.0)

## 2021-11-18 NOTE — Patient Instructions (Signed)
Description   ?Continue on same dosage of warfarin 1 tablet daily except for 1.5 tablets in [place].  Recheck INR in 6 weeks.  Coumadin Clinic [phone].  ?  ?  ?

## 2021-11-23 ENCOUNTER — Other Ambulatory Visit: Payer: Self-pay | Admitting: Urology

## 2021-11-23 ENCOUNTER — Telehealth: Payer: Self-pay | Admitting: Cardiovascular Disease

## 2021-11-23 NOTE — Telephone Encounter (Signed)
Please inform the patient that he will need a CBC blood work so our clinical pharmacist can calculate the holding time for Coumadin ?

## 2021-11-23 NOTE — Telephone Encounter (Signed)
Patient with diagnosis of atrial fibrillation on warfarin for anticoagulation.   ? ?Procedure: cysto possible ureteroscopy possible bladder tumor ?Date of procedure: 12/30/21 ? ? ?CHA2DS2-VASc Score = 3  ? This indicates a 3.2% annual risk of stroke. ?The patient's score is based upon: ?CHF History: 0 ?HTN History: 1 ?Diabetes History: 0 ?Stroke History: 0 ?Vascular Disease History: 1 ?Age Score: 1 ?Gender Score: 0 ?  ? ?CrCl 114 ?Platelet count NEEDS LABS ? ?Will need CBC drawn for final clearance  ? ?

## 2021-11-23 NOTE — Telephone Encounter (Signed)
Clinical pharmacist to review Coumadin ?

## 2021-11-23 NOTE — Telephone Encounter (Signed)
? ?  Pre-operative Risk Assessment  ?  ?Patient Name: Jesse Franklin  ?DOB: [birth year] ?MRN: [id number]  ? ?  ? ?Request for Surgical Clearance   ? ?Procedure:  Cysto possible Ureteroscopy possible bladder tumor ? ?Date of Surgery:    12-30-21 ? ?                              ?   ?Surgeon:  Dr Link Snuffer ?Surgeon's Group or Practice Name:   ?Phone number:  [phone] ?Fax number:  [phone] ?  ?Type of Clearance Requested:   ?- Medical  ?- Pharmacy:  Hold Clopidogrel (Plavix) and Warfarin (Coumadin)   ?  ?Type of Anesthesia:  General  ?  ?Additional requests/questions:   ? ?Signed, ?Glyn Ade   ?11/23/2021, 2:59 PM  ? ?

## 2021-11-24 NOTE — Telephone Encounter (Signed)
? ?  Name: Jesse Franklin  ?DOB: 1951-05-28  ?MRN: [id number] ? ?Primary Cardiologist: Mertie Moores, MD ? ?Chart reviewed as part of pre-operative protocol coverage. Patient has not been seen since 01/03/2021. By the time of his surgery, it will have almost been 1 full year since we saw him. Therefore, I think he should just have a follow-up visit in order to better assess preoperative cardiovascular risk and then we can update a CBC at the same time. ? ?Pre-op covering staff: ?- Please schedule appointment and call patient to inform them. If patient already had an upcoming appointment within acceptable timeframe, please add "pre-op clearance" to the appointment notes so provider is aware. ?- Please contact requesting surgeon's office via preferred method (i.e, phone, fax) to inform them of need for appointment prior to surgery. ? ?If applicable, this message will also be routed to pharmacy pool and/or primary cardiologist for input on holding anticoagulant/antiplatelet agent as requested below so that this information is available to the clearing provider at time of patient's appointment. Dr. Acie Fredrickson, can you please commend on how long Plavix can be held? He has a history of CAD with a subtotal occlusion of distal LAD noted on cardiac catheterization in 2005. Please route response back to P CV DIV PREOP. ? ?Thank you! ? ?Darreld Mclean, PA-C  ?11/24/2021, 2:16 PM  ? ?

## 2021-11-24 NOTE — Telephone Encounter (Signed)
Left message for the pt to call back and schedule CBC for pre op. I will place the lab orders, this is not fasting lab so ok if the pt eats.  ?

## 2021-11-24 NOTE — Telephone Encounter (Signed)
Patient returned call.  I advised him that he has appt tomorrow 3/17 with Dr. Acie Fredrickson for pre op clearance at 3:40pm. Patient verbalized understanding.  ?

## 2021-11-24 NOTE — Telephone Encounter (Signed)
I will update the requesting office the pt has appt tomorrow with Dr. Acie Fredrickson.  ?

## 2021-11-24 NOTE — Telephone Encounter (Signed)
Left detailed message for the pt that he is going to need an in person appt as well for pre op clearance. Left message that I was just given that information. Left message for the pt that we can get the lab work at 3:40 appt with Dr, Acie Fredrickson. Left message for the pt that he will need to call the office today before 5 pm and confirm the appt @ 3:40 with Dr. Acie Fredrickson 11/25/21. Otherwise if we do not here back from the pt by 5 pm today I will have to cancel the appt and reschedule in person appt.  ?

## 2021-11-25 ENCOUNTER — Other Ambulatory Visit: Payer: Self-pay

## 2021-11-25 ENCOUNTER — Other Ambulatory Visit: Payer: Medicare Other | Admitting: *Deleted

## 2021-11-25 ENCOUNTER — Ambulatory Visit (INDEPENDENT_AMBULATORY_CARE_PROVIDER_SITE_OTHER): Payer: Medicare Other | Admitting: Cardiovascular Disease

## 2021-11-25 ENCOUNTER — Encounter: Payer: Self-pay | Admitting: Cardiovascular Disease

## 2021-11-25 VITALS — BP 124/68 | HR 51 | Ht 67.0 in | Wt 159.6 lb

## 2021-11-25 DIAGNOSIS — Z5181 Encounter for therapeutic drug level monitoring: Secondary | ICD-10-CM

## 2021-11-25 DIAGNOSIS — I4891 Unspecified atrial fibrillation: Secondary | ICD-10-CM

## 2021-11-25 DIAGNOSIS — Z01818 Encounter for other preprocedural examination: Secondary | ICD-10-CM | POA: Diagnosis not present

## 2021-11-25 DIAGNOSIS — I25119 Atherosclerotic heart disease of native coronary artery with unspecified angina pectoris: Secondary | ICD-10-CM

## 2021-11-25 LAB — CBC
Hematocrit: 34.9 % — ABNORMAL LOW (ref 37.5–51.0)
Hemoglobin: 12.4 g/dL — ABNORMAL LOW (ref 13.0–17.7)
MCH: 31.7 pg (ref 26.6–33.0)
MCHC: 35.5 g/dL (ref 31.5–35.7)
MCV: 89 fL (ref 79–97)
Platelets: 165 10*3/uL (ref 150–450)
RBC: 3.91 x10E6/uL — ABNORMAL LOW (ref 4.14–5.80)
RDW: 13.8 % (ref 11.6–15.4)
WBC: 3.4 10*3/uL (ref 3.4–10.8)

## 2021-11-25 NOTE — Progress Notes (Signed)
? ?Cardiology Office Note ? ? ?Date:  11/25/2021  ? ?ID:  Jesse Franklin, DOB 11/01/1950, MRN [id number] ? ?PCP:  Leeanne Rio, MD  ?Cardiologist:   Mertie Moores, MD  ? ?Chief Complaint  ?Patient presents with  ? Coronary Artery Disease  ?   ?  ? Hyperlipidemia  ? ?1. Atrial fibrillation - ? ?2. Coronary artery disease ?RESULTS: 08/30/04 ?1. Left main: Normal.   ?2. LAD: Large vessel, tortuous in the mid and distal section with mild   ?luminal irregularities. The extreme distal portion of the LAD after the   ?trifurcation of the distal vessel has a subtotal occlusion at the   ?trifurcation tip. Vessel less than 0.Franklin mm at this juncture.   ?3. LXC: Codominant with mild luminal irregularities.   ?4. OM1 and OM2: Large vessels with mild luminal irregularities.   ?Franklin. RCA: Tortuous, codominant with mild luminal irregularities, very faint   ?right to left filling collaterals seen at the apex.   ?6. LV: EF 50-55% with mild inferior apical hypokinesis. LVEDP was 35   ?mmHg.   ?7. Abdominal/distal aortogram showed mild tortuosity. No significant   ?aneurysms were seen. The distal aorta showed mild splaying to the left   ?at the area of the iliac bifurcation. No significant iliac or femoral   ?disease was noted. ? ?3. Hypertension ?4. 6. Moderate pulmonary hypertension by echo in 2010 ( ongoing cigarette smoking)   ?Franklin. Hyperlipidemia ? ? ?Jesse Franklin is a 71 yo with hx as noted above. He has occasional episodes of left arm tingling. These episodes last 1-2 seconds. They're not necessarily associated with exertion.  The tingling seems to get better with arm movement. He also has occasional episodes of shortness of breath. ? ?He walks on a regular basis. He walks approximately 1 mile a day.  He still eats some salt and also eats fried and fast foods. ? ?December 02, 2013: ? ?Jesse Franklin is doing well. He denies any chest pain or shortness of breath.  He walks about 15-20 minutes each day.  He no longer works.  He has cut down on his  salt usage.  ?  ?Jesse Franklin, Jesse Franklin:   ?Jesse Franklin is a 71 y.o. male who presents for follow-up of his atrial fibrillation. He also has a history of hypertension  ?Still smoking  -  ? ?Nov. 1, Jesse Franklin: ? ?Doing well from a cardiac standpoint.  ?Having gout issues  in left elbow and hand.  ?No Cp or dyspnea.  ?Still smoking  ? ?January 03, 2016:   ? ?Jesse Franklin is seen today  ?No CP , some back pain  ?Still smoking  ? ? ?December 25, 2016 ? occasional cp .  ?No changes in his angina pattern ?Still smoking  ? ?July 27, 2017: ? ?Staying active , no further CP or dyspnea .  ?Sells loose cigaretes.  $0.50 per cigarette  ? ?May 09, 2018:  ?Jesse Franklin was seen today for follow-up visit. ?History of coronary artery disease and atrial fibrillation. ?He remains on Coumadin. ?HR is slow (junctional escape rhythm) sees.  He is not having any episodes of weakness or dizziness. ? ?He has occasional episodes of left-sided arm and left-sided shoulder pain. ?Last few seconds. ?No chest pain with walking.  He still smokes 3 to 4 cigarettes a day. ?Also smokes marijuana  ? ?He has known coronary artery disease.  He has a distal LAD subtotal occlusion by heart catheterization in 2005. ? ? ?December 19, 2019: ?Jesse Franklin  is seen back today for follow up visit for his CAD, atrial fib and HLD ?Has rare cp.   Does not feel like his pain before his stent .   Last for 3-4 seconds.  ?Had to take NTG on 1 occasion.   Walks around some  ?Walking does not bring on the cp.  Typically occurs while he is sitting  ? ?Still smokes several cigarettes a day .   We discussed cessation efforts.  ? ?January 03, 2021: ? ?Jesse Franklin is seen today for follow up of his CAD, atrial fib, HLD ?Hx of coronary stenting in the past  ?Rare chest pain . ?Left arm tingling ,  Last for several seconds  ?Not associated with exercise  ?Walks his every day .  1/4 mile a day  ?Still smoking 4-Franklin cigarettes a day  ? ? ?November 25, 2021 ?Jesse Franklin is seen today for follow up of his CAD, atrial fib, HLD  ?Pt  needs to have ureteroscopy with rescection of bladder tumor - scheduled for April 21.  ?Wll need to hold warfarin  ?He will get those instructions re: coumadin from our coumadin clinic  ? ?He is at low risk for  ? ?Rare episodes of DOE  ?Works at [employer]   ?Has lost some weight while working  ?No angina similar to prior to his stenting  ? ? ? ?Past Medical History:  ?Diagnosis Date  ? Alcohol abuse   ? Anemia   ? Atrial fibrillation ([place])   ? BPH 11/08/2006  ? CAD (coronary artery disease)   ? Branch Vessel  ? Cocaine abuse ([place])   ? Hx of  ? GERD (gastroesophageal reflux disease)   ? Gout   ? Herpes   ? Herpes genitalia 04/13/2011  ? Hyperlipidemia   ? Hypertension   ? Myocardial infarction [facility]) 2005  ? RHINITIS, ALLERGIC 11/08/2006  ? Tobacco abuse   ? ? ?Past Surgical History:  ?Procedure Laterality Date  ? CARDIAC CATHETERIZATION  08/2004  ? CARPAL TUNNEL RELEASE Right 04/04/2017  ? Procedure: RIGHT CARPAL TUNNEL RELEASE;  Surgeon: Leandrew Koyanagi, MD;  Location: New Site;  Service: Orthopedics;  Laterality: Right;  ? LACERATION REPAIR Right 03/1988  ? "hand"  ? ? ? ?Current Outpatient Medications  ?Medication Sig Dispense Refill  ? allopurinol (ZYLOPRIM) 100 MG tablet Take 200 mg by mouth daily.    ? amLODipine (NORVASC) 10 MG tablet TAKE 1 TABLET BY MOUTH EVERY DAY 90 tablet 3  ? clopidogrel (PLAVIX) 75 MG tablet TAKE 1 TABLET BY MOUTH EVERY DAY 90 tablet 0  ? colchicine 0.6 MG tablet TAKE 1 TABLET BY MOUTH 2 TIMES DAILY. 180 tablet 0  ? famotidine (PEPCID) 20 MG tablet Take 1 tablet (20 mg total) by mouth 2 (two) times daily. 180 tablet 1  ? nitroGLYCERIN (NITROSTAT) 0.4 MG SL tablet Place 1 tablet (0.4 mg total) under the tongue as directed. Place 1 tab under tongue as directed 25 tablet 6  ? pantoprazole (PROTONIX) 40 MG tablet TAKE 1 TABLET BY MOUTH EVERY DAY 90 tablet 1  ? rosuvastatin (CRESTOR) 40 MG tablet TAKE 1 TABLET BY MOUTH EVERY DAY 90 tablet 3  ? tamsulosin (FLOMAX) 0.4 MG CAPS  capsule Take 1 capsule (0.4 mg total) by mouth daily. 90 capsule 1  ? warfarin (COUMADIN) Franklin MG tablet TAKE AS DIRECTED BY COUMADIN CLINIC 120 tablet 1  ? ?No current facility-administered medications for this visit.  ? ?Facility-Administered Medications Ordered in Other Visits  ?  Medication Dose Route Frequency Provider Last Rate Last Admin  ? 0.9 %  sodium chloride infusion   Intravenous Continuous Willeen Niece, MD 125 mL/hr at 07/31/14 1200 New Bag at 07/31/14 1200  ? ? ?Allergies:   Codeine and Ketorolac tromethamine  ? ? ?Social History:  The patient  reports that he has been smoking cigarettes. He has a 12.00 pack-year smoking history. He quit smokeless tobacco use about 54 years ago.  His smokeless tobacco use included chew. He reports current drug use. Drugs: "Crack" cocaine and Marijuana. He reports that he does not drink alcohol.  ? ?Family History:  The patient's family history includes Alcohol abuse in his brother and father; Arthritis in his brother; Cancer in his sister; Gout in his mother; Heart disease in his brother; Hypertension in his mother and sister.  ? ? ?ROS:   Noted in current history, otherwise review of systems is negative. ? ? ?Physical Exam: ?There were no vitals taken for this visit. ? ?GEN:  Well nourished, well developed in no acute distress ?HEENT: Normal ?NECK: No JVD; No carotid bruits ?LYMPHATICS: No lymphadenopathy ?CARDIAC: irreg. Irreg.  ?RESPIRATORY:  Clear to auscultation without rales, wheezing or rhonchi  ?ABDOMEN: Soft, non-tender, non-distended ?MUSCULOSKELETAL:  No edema; No deformity  ?SKIN: Warm and dry ?NEUROLOGIC:  Alert and oriented x 3 ? ? ? ?EKG:    November 25, 2021: Atrial fibrillation with a slow ventricular response.  T wave inversions in lead V2 through V6. ?  ? ?Recent Labs: ?01/03/2021: ALT 34; BUN 8; Creatinine, Ser 1.24; Potassium 4.2; Sodium 143  ? ? ?Lipid Panel ?   ?Component Value Date/Time  ? CHOL 138 02/01/2021 1047  ? TRIG 108 02/01/2021 1047  ? HDL 42  02/01/2021 1047  ? CHOLHDL 3.3 02/01/2021 1047  ? CHOLHDL 3.4 08/02/2016 0913  ? VLDL 26 08/02/2016 0913  ? [place] 76 02/01/2021 1047  ? LDLDIRECT 95 05/28/2012 0925  ? ?  ? ?Wt Readings from Last 3 Encount

## 2021-11-25 NOTE — Telephone Encounter (Signed)
Patient may hold warfarin for 5 days prior to procedure. He does NOT need a lovenox bridge. ?

## 2021-11-25 NOTE — Patient Instructions (Signed)
Medication Instructions:  ?Your physician recommends that you continue on your current medications as directed. Please refer to the Current Medication list given to you today. ? ?*If you need a refill on your cardiac medications before your next appointment, please call your pharmacy* ? ?Lab Work: ?NONE ?If you have labs (blood work) drawn today and your tests are completely normal, you will receive your results only by: ?MyChart Message (if you have MyChart) OR ?A paper copy in the mail ?If you have any lab test that is abnormal or we need to change your treatment, we will call you to review the results. ? ?Testing/Procedures: ?NONE ? ?Follow-Up: ?At [facility], you and your health needs are our priority.  As part of our continuing mission to provide you with exceptional heart care, we have created designated Provider Care Teams.  These Care Teams include your primary Cardiologist (physician) and Advanced Practice Providers (APPs -  Physician Assistants and Nurse Practitioners) who all work together to provide you with the care you need, when you need it. ? ?We recommend signing up for the patient portal called "MyChart".  Sign up information is provided on this After Visit Summary.  MyChart is used to connect with patients for Virtual Visits (Telemedicine).  Patients are able to view lab/test results, encounter notes, upcoming appointments, etc.  Non-urgent messages can be sent to your provider as well.   ?To learn more about what you can do with MyChart, go to [email or web address].   ? ?Your next appointment:   ?1 year(s) ? ?The format for your next appointment:   ?In Person ? ?Provider:   ?Mertie Moores, MD  or Robbie Lis, PA-C or Richardson Dopp, [place] ?

## 2021-11-28 ENCOUNTER — Telehealth: Payer: Self-pay | Admitting: Physician Assistant

## 2021-11-28 NOTE — Telephone Encounter (Signed)
Spoke with [facility] @ [facility]. The pt's procedure is scheduled for Siena Poehler 21st not tomorrow. She is aware of the Plavix recommendations.  ?

## 2021-11-28 NOTE — Telephone Encounter (Addendum)
Pam from [facility] - pt due for cystoscopy, TURB tumor on 12/30/21. ? ?[phone] ext [phone] ?Fax: [phone] ? ? ?It looks like coumadin was addressed in his recent clinic note on 11/25/21 but not plavix. Pam is wondering if we can help guide plavix hold.  ? ?Dr. Acie Fredrickson, can you please let the requesting office know if and how long he can hold plavix? ? ?I've listed her direct number and fax above. ? ?This encounter will be removed from the preop box.  ? ? ?ADDENDUM: ?Per Dr. Acie Fredrickson: ?It will be fine for him to hold his plavix for 5-7 days  ? ? ? ?Ledora Bottcher, PA-C ?11/28/2021, 10:17 AM ?[phone] ?[place] ?[address] ?[place], [place] [address] ? ? ?

## 2021-12-01 ENCOUNTER — Encounter: Payer: Self-pay | Admitting: Family Medicine

## 2021-12-01 ENCOUNTER — Other Ambulatory Visit (INDEPENDENT_AMBULATORY_CARE_PROVIDER_SITE_OTHER): Payer: Medicare Other | Admitting: Family Medicine

## 2021-12-01 DIAGNOSIS — Z1211 Encounter for screening for malignant neoplasm of colon: Secondary | ICD-10-CM | POA: Diagnosis not present

## 2021-12-01 LAB — HEMOCCULT GUIAC POC 1CARD (OFFICE): Fecal Occult Blood, POC: NEGATIVE

## 2021-12-12 DIAGNOSIS — K7581 Nonalcoholic steatohepatitis (NASH): Secondary | ICD-10-CM | POA: Diagnosis not present

## 2021-12-12 DIAGNOSIS — K7402 Hepatic fibrosis, advanced fibrosis: Secondary | ICD-10-CM | POA: Diagnosis not present

## 2021-12-14 ENCOUNTER — Other Ambulatory Visit: Payer: Self-pay | Admitting: Nurse Practitioner

## 2021-12-14 DIAGNOSIS — K7402 Hepatic fibrosis, advanced fibrosis: Secondary | ICD-10-CM

## 2021-12-14 DIAGNOSIS — K7581 Nonalcoholic steatohepatitis (NASH): Secondary | ICD-10-CM

## 2021-12-14 NOTE — Progress Notes (Addendum)
DUE TO COVID-19 ONLY ONE VISITOR IS ALLOWED TO COME WITH YOU AND STAY IN THE WAITING ROOM ONLY DURING PRE OP AND PROCEDURE DAY OF SURGERY.  2 VISITOR  MAY VISIT WITH YOU AFTER SURGERY IN YOUR PRIVATE ROOM DURING VISITING HOURS ONLY! ?YOU MAY HAVE ONE PERSON SPEND THE NITE WITH YOU IN YOUR ROOM AFTER SURGERY.   ? ? ? ? Your procedure is scheduled on:  ?12/30/20  ? Report to [facility] Main  Entrance ? ? Report to admitting at     80             AM ?DO NOT BRING INSURANCE CARD, PICTURE ID OR WALLET DAY OF SURGERY.  ?  ? ? Call this number if you have problems the morning of surgery [phone]  ? ? REMEMBER: NO  SOLID FOODS , CANDY, GUM OR MINTS AFTER MIDNITE THE NITE BEFORE SURGERY .       Marland Kitchen CLEAR LIQUIDS UNTIL      1030am           DAY OF SURGERY.     ? ? ?CLEAR LIQUID DIET ? ? ?Foods Allowed      ?WATER ?BLACK COFFEE ( SUGAR OK, NO MILK, CREAM OR CREAMER) REGULAR AND DECAF  ?TEA ( SUGAR OK NO MILK, CREAM, OR CREAMER) REGULAR AND DECAF  ?PLAIN JELLO ( NO RED)  ?FRUIT ICES ( NO RED, NO FRUIT PULP)  ?POPSICLES ( NO RED)  ?JUICE- APPLE, WHITE GRAPE AND WHITE CRANBERRY  ?SPORT DRINK LIKE GATORADE ( NO RED)  ?CLEAR BROTH ( VEGETABLE , CHICKEN OR BEEF)                                                               ? ?    ? ?BRUSH YOUR TEETH MORNING OF SURGERY AND RINSE YOUR MOUTH OUT, NO CHEWING GUM CANDY OR MINTS. ?  ? ? Take these medicines the morning of surgery with A SIP OF WATER:  ?Allopurinol, colchicine, amlodipine, pepcid, protonix, flomax  ? ? ? ?DO NOT TAKE ANY DIABETIC MEDICATIONS DAY OF YOUR SURGERY ?                  ?            You may not have any metal on your body including hair pins and  ?            piercings  Do not wear jewelry, make-up, lotions, powders or perfumes, deodorant ?            Do not wear nail polish on your fingernails.   ?           IF YOU ARE A MALE AND WANT TO SHAVE UNDER ARMS OR LEGS PRIOR TO SURGERY YOU MUST DO SO AT LEAST 48 HOURS PRIOR TO SURGERY.  ?            Men  may shave face and neck. ? ? Do not bring valuables to the hospital. [facility] NOT ?            RESPONSIBLE   FOR VALUABLES. ? Contacts, dentures or bridgework may not be worn into surgery. ? Leave suitcase in the car. After surgery it may be brought to your  room. ? ?  ? Patients discharged the day of surgery will not be allowed to drive home. IF YOU ARE HAVING SURGERY AND GOING HOME THE SAME DAY, YOU MUST HAVE AN ADULT TO DRIVE YOU HOME AND BE WITH YOU FOR 24 HOURS. YOU MAY GO HOME BY TAXI OR UBER OR ORTHERWISE, BUT AN ADULT MUST ACCOMPANY YOU HOME AND STAY WITH YOU FOR 24 HOURS. ?  ? ?            Please read over the following fact sheets you were given: ?_____________________________________________________________________ ? ?[place] - Preparing for Surgery ?Before surgery, you can play an important role.  Because skin is not sterile, your skin needs to be as free of germs as possible.  You can reduce the number of germs on your skin by washing with CHG (chlorahexidine gluconate) soap before surgery.  CHG is an antiseptic cleaner which kills germs and bonds with the skin to continue killing germs even after washing. ?Please DO NOT use if you have an allergy to CHG or antibacterial soaps.  If your skin becomes reddened/irritated stop using the CHG and inform your nurse when you arrive at Short Stay. ?Do not shave (including legs and underarms) for at least 48 hours prior to the first CHG shower.  You may shave your face/neck. ?Please follow these instructions carefully: ? 1.  Shower with CHG Soap the night before surgery and the  morning of Surgery. ? 2.  If you choose to wash your hair, wash your hair first as usual with your  normal  shampoo. ? 3.  After you shampoo, rinse your hair and body thoroughly to remove the  shampoo.                           4.  Use CHG as you would any other liquid soap.  You can apply chg directly  to the skin and wash  ?                     Gently with a scrungie or clean  washcloth. ? 5.  Apply the CHG Soap to your body ONLY FROM THE NECK DOWN.   Do not use on face/ open      ?                     Wound or open sores. Avoid contact with eyes, ears mouth and genitals (private parts).  ?                     Production manager,  Genitals (private parts) with your normal soap. ?            6.  Wash thoroughly, paying special attention to the area where your surgery  will be performed. ? 7.  Thoroughly rinse your body with warm water from the neck down. ? 8.  DO NOT shower/wash with your normal soap after using and rinsing off  the CHG Soap. ?               9.  Pat yourself dry with a clean towel. ?           10.  Wear clean pajamas. ?           11.  Place clean sheets on your bed the night of your first shower and do not  sleep with pets. ?Day of Surgery : ?Do  not apply any lotions/deodorants the morning of surgery.  Please wear clean clothes to the hospital/surgery center. ? ?FAILURE TO FOLLOW THESE INSTRUCTIONS MAY RESULT IN THE CANCELLATION OF YOUR SURGERY ?PATIENT SIGNATURE_________________________________ ? ?NURSE SIGNATURE__________________________________ ? ?________________________________________________________________________  ? ? ?           ?

## 2021-12-14 NOTE — Progress Notes (Addendum)
Anesthesia Review: ? ?PCP: Chrisandra Netters  ?Cardiologist :- Dr Mertie Moores Krum 11/25/21  ?Liver- Dawm Drazek,NP- LOV 12/12/21  ?Chest x-ray : ?EKG :11/25/2021  ?Echo : 2019  ?Stress test: ?Cardiac Cath :  ?Activity level:  can do a flgiht of stairs without difficulty  ?Sleep Study/ CPAP : none  ?Fasting Blood Sugar :      / Checks Blood Sugar -- times a day:   ?Blood Thinner/ Instructions /Last Dose: ?ASA / Instructions/ Last Dose :  ?Plavix ?Coumadin  - stop 5 days prior per pt  ?Called Pharmacy x 4 at preop .  NO answer.  Call 355 number. Apolonio Schneiders in Pharmacy was to send a message to [employer].  No one respoinded.  PT given number for pharmacy and aware to call to give med list to them.  PT voiced understanding.  Nurse reviewed meds with pt at preop.  Meds are in epic.   ?Pharmacy tech called back at approximately 100pm.  Informed pharmacy that pt had been given phone number to call pharmacy.  Pharmac given pt's phone number.   ?

## 2021-12-16 ENCOUNTER — Other Ambulatory Visit: Payer: Self-pay | Admitting: Family Medicine

## 2021-12-16 ENCOUNTER — Other Ambulatory Visit: Payer: Self-pay | Admitting: Cardiovascular Disease

## 2021-12-19 ENCOUNTER — Encounter (HOSPITAL_COMMUNITY)
Admission: RE | Admit: 2021-12-19 | Discharge: 2021-12-19 | Disposition: A | Discharge status: Home / Self Care | Payer: Medicare Other | Source: Ambulatory Visit | Attending: Urology | Admitting: Urology

## 2021-12-19 ENCOUNTER — Encounter (HOSPITAL_COMMUNITY): Payer: Self-pay

## 2021-12-19 ENCOUNTER — Other Ambulatory Visit: Payer: Self-pay

## 2021-12-19 VITALS — BP 138/90 | HR 73 | Temp 97.9°F | Resp 16 | Ht 66.5 in | Wt 158.0 lb

## 2021-12-19 DIAGNOSIS — I129 Hypertensive chronic kidney disease with stage 1 through stage 4 chronic kidney disease, or unspecified chronic kidney disease: Secondary | ICD-10-CM | POA: Insufficient documentation

## 2021-12-19 DIAGNOSIS — F17210 Nicotine dependence, cigarettes, uncomplicated: Secondary | ICD-10-CM | POA: Diagnosis not present

## 2021-12-19 DIAGNOSIS — R310 Gross hematuria: Secondary | ICD-10-CM | POA: Diagnosis not present

## 2021-12-19 DIAGNOSIS — I2510 Atherosclerotic heart disease of native coronary artery without angina pectoris: Secondary | ICD-10-CM | POA: Insufficient documentation

## 2021-12-19 DIAGNOSIS — Z01818 Encounter for other preprocedural examination: Secondary | ICD-10-CM

## 2021-12-19 DIAGNOSIS — Z01812 Encounter for preprocedural laboratory examination: Secondary | ICD-10-CM | POA: Diagnosis not present

## 2021-12-19 DIAGNOSIS — I4811 Longstanding persistent atrial fibrillation: Secondary | ICD-10-CM

## 2021-12-19 DIAGNOSIS — I4891 Unspecified atrial fibrillation: Secondary | ICD-10-CM | POA: Diagnosis not present

## 2021-12-19 HISTORY — DX: Chronic kidney disease, unspecified: N18.9

## 2021-12-19 HISTORY — DX: Unspecified osteoarthritis, unspecified site: M19.90

## 2021-12-19 LAB — CBC
HCT: 37.8 % — ABNORMAL LOW (ref 39.0–52.0)
Hemoglobin: 13.1 g/dL (ref 13.0–17.0)
MCH: 32.5 pg (ref 26.0–34.0)
MCHC: 34.7 g/dL (ref 30.0–36.0)
MCV: 93.8 fL (ref 80.0–100.0)
Platelets: 148 10*3/uL — ABNORMAL LOW (ref 150–400)
RBC: 4.03 MIL/uL — ABNORMAL LOW (ref 4.22–5.81)
RDW: 13.4 % (ref 11.5–15.5)
WBC: 3.4 10*3/uL — ABNORMAL LOW (ref 4.0–10.5)
nRBC: 0 % (ref 0.0–0.2)

## 2021-12-19 LAB — BASIC METABOLIC PANEL
Anion gap: 6 (ref 5–15)
BUN: 11 mg/dL (ref 8–23)
CO2: 23 mmol/L (ref 22–32)
Calcium: 9.3 mg/dL (ref 8.9–10.3)
Chloride: 110 mmol/L (ref 98–111)
Creatinine, Ser: 1.29 mg/dL — ABNORMAL HIGH (ref 0.61–1.24)
GFR, Estimated: >60 mL/min (ref >60)
Glucose, Bld: 87 mg/dL (ref 70–99)
Potassium: 3.9 mmol/L (ref 3.5–5.1)
Sodium: 139 mmol/L (ref 135–145)

## 2021-12-20 ENCOUNTER — Ambulatory Visit (HOSPITAL_COMMUNITY): Payer: Medicare Other | Admitting: Anesthesiology

## 2021-12-20 ENCOUNTER — Ambulatory Visit (HOSPITAL_COMMUNITY): Payer: Medicare Other | Admitting: Emergency Medicine

## 2021-12-20 NOTE — Progress Notes (Addendum)
Anesthesia Chart Review: ? ? Case: [id number] Date/Time: 12/30/21 1315  ? 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 Anesthesia type: General  ? Pre-op diagnosis: GROSS HEMATURIA  ? Location: [facility] PROCEDURE ROOM / [facility]  ? Surgeons: Lucas Mallow, MD  ? ?  ? ? ?DISCUSSION: ?Pt is 71 years old with hx CAD (distal branch obstructive disease on 2005 cath --> medical mgmt), atrial fibrillation, HTN, CKD, NASH, advanced hepatic fibrosis. Current smoker.  ? ?Pt to hold plavix for 5-7 days. Pt to hold warfarin 5 days before surgery.  ? ? ?VS: BP 138/90   Pulse 73   Temp 36.6 ?C (Oral)   Resp 16   Ht 5' 6.5" (1.689 m)   Wt 71.7 kg   SpO2 100%   BMI 25.12 kg/m?  ? ?PROVIDERS: ?- PCP is Leeanne Rio, MD ?- Cardiologist is Mertie Moores, MD ?- Hepatology care by Roosevelt Locks, NP. Last office visit 12/12/21 (notes in care everywhere)  ? ?LABS: Labs reviewed: Acceptable for surgery. ?- LFTs 12/12/21 (care everywhere) normal except alk phos 125 ? ?(all labs ordered are listed, but only abnormal results are displayed) ? ?Labs Reviewed  ?BASIC METABOLIC PANEL - Abnormal; Notable for the following components:  ?    Result Value  ? Creatinine, Ser 1.29 (*)   ? All other components within normal limits  ?CBC - Abnormal; Notable for the following components:  ? WBC 3.4 (*)   ? RBC 4.03 (*)   ? HCT 37.8 (*)   ? Platelets 148 (*)   ? All other components within normal limits  ? ? ? ?IMAGES: ?[place] abd limited 07/07/21:  ?- Mild coarsening of the hepatic echotexture, nonspecific finding can be seen the setting of parenchymal fibrosis. No solid intrahepatic masses identified. ?- Redemonstration of multiple benign intrahepatic cysts. ? ? ?EKG 11/25/21: Atrial fibrillation with a slow ventricular response.  T wave inversions in lead V2 through V6 ? ? ?CV: ?Echo 05/22/18:  ?- Left ventricle: The cavity size  was normal. Wall thickness was normal. Systolic function was normal. The estimated ejection fraction was in the range of 60% to 65%. Wall motion was normal; there were no regional wall motion abnormalities.  ?- Aortic valve: There was mild regurgitation.  ?- Left atrium: The atrium was mildly dilated.  ?- Pulmonary arteries: Systolic pressure was moderately increased. PA peak pressure: 41 mm Hg (S).  ? ?Cardiac cath 08/30/04:  ?1.  Distal branch vessel obstructive disease at the very distal takeoff after the bifurcation of the left anterior descending (vessel approximately 0.5 mm). ?2.  Nonobstructive left main, left anterior descending, diagonal, left circumflex, obtuse marginal, and right coronary artery. ?3.  Preserved left ventricular systolic function, ejection fraction of 50-55% with inferior apical hypokinesis. ?4.  Elevated EDP of approximately 35 mmHg. ? ? ?Past Medical History:  ?Diagnosis Date  ? Alcohol abuse   ? Anemia   ? Arthritis   ? Atrial fibrillation ([place])   ? BPH 11/08/2006  ? CAD (coronary artery disease)   ? Branch Vessel  ? Chronic kidney disease   ? Cocaine abuse ([place])   ? Hx of  ? GERD (gastroesophageal reflux disease)   ? Gout   ? Herpes   ? Herpes genitalia 04/13/2011  ? Hyperlipidemia   ? Hypertension   ? Myocardial infarction [facility]) 2005  ? RHINITIS, ALLERGIC 11/08/2006  ? Tobacco abuse   ? ? ?  Past Surgical History:  ?Procedure Laterality Date  ? CARDIAC CATHETERIZATION  08/11/2004  ? CARPAL TUNNEL RELEASE Right 04/04/2017  ? Procedure: RIGHT CARPAL TUNNEL RELEASE;  Surgeon: Leandrew Koyanagi, MD;  Location: [facility];  Service: Orthopedics;  Laterality: Right;  ? CORONARY ARTERY BYPASS GRAFT    ? LACERATION REPAIR Right 03/11/1988  ? "hand"  ? ? ?MEDICATIONS: ? allopurinol (ZYLOPRIM) 100 MG tablet  ? amLODipine (NORVASC) 10 MG tablet  ? clopidogrel (PLAVIX) 75 MG tablet  ? colchicine 0.6 MG tablet  ? famotidine (PEPCID) 20 MG tablet  ? nitroGLYCERIN (NITROSTAT) 0.4 MG SL  tablet  ? pantoprazole (PROTONIX) 40 MG tablet  ? rosuvastatin (CRESTOR) 40 MG tablet  ? tamsulosin (FLOMAX) 0.4 MG CAPS capsule  ? warfarin (COUMADIN) 5 MG tablet  ? ?No current facility-administered medications for this encounter.  ? ? 0.9 %  sodium chloride infusion  ? ?- Pt to hold plavix for 5-7 days. Pt to hold warfarin 5 days before surgery.  ? ? ?If no changes, I anticipate pt can proceed with surgery as scheduled.  ? ?Willeen Cass, PhD, FNP-BC ?[facility]/Anesthesiology ?Phone: [phone] ?12/20/2021 1:09 PM ? ? ? ? ? ? ?

## 2021-12-20 NOTE — Anesthesia Preprocedure Evaluation (Signed)
Anesthesia Evaluation  ? ? ?Airway ? ? ? ? ? ? ? Dental ?  ?Pulmonary ?Current Smoker,  ?  ? ? ? ? ? ? ? Cardiovascular ?hypertension,  ? ? ?  ?Neuro/Psych ?  ? GI/Hepatic ?  ?Endo/Other  ? ? Renal/GU ?  ? ?  ?Musculoskeletal ? ? Abdominal ?  ?Peds ? Hematology ?  ?Anesthesia Other Findings ? ? Reproductive/Obstetrics ? ?  ? ? ? ? ? ? ? ? ? ? ? ? ? ?  ?  ? ? ? ? ? ? ? ? ?Anesthesia Physical ?Anesthesia Plan ? ?ASA:  ? ?Anesthesia Plan:   ? ?Post-op Pain Management:   ? ?Induction:  ? ?PONV Risk Score and Plan:  ? ?Airway Management Planned:  ? ?Additional Equipment:  ? ?Intra-op Plan:  ? ?Post-operative Plan:  ? ?Informed Consent:  ? ?Plan Discussed with:  ? ?Anesthesia Plan Comments: (See APP note by Durel Salts, FNP )  ? ? ? ? ? ? ?Anesthesia Quick Evaluation ? ?

## 2021-12-23 ENCOUNTER — Other Ambulatory Visit: Payer: Self-pay | Admitting: *Deleted

## 2021-12-23 ENCOUNTER — Ambulatory Visit
Admission: RE | Admit: 2021-12-23 | Discharge: 2021-12-23 | Disposition: A | Discharge status: Home / Self Care | Payer: Medicare Other | Source: Ambulatory Visit | Attending: Nurse Practitioner | Admitting: Nurse Practitioner

## 2021-12-23 DIAGNOSIS — K7689 Other specified diseases of liver: Secondary | ICD-10-CM | POA: Diagnosis not present

## 2021-12-23 DIAGNOSIS — K7581 Nonalcoholic steatohepatitis (NASH): Secondary | ICD-10-CM

## 2021-12-23 DIAGNOSIS — K7402 Hepatic fibrosis, advanced fibrosis: Secondary | ICD-10-CM

## 2021-12-26 ENCOUNTER — Other Ambulatory Visit: Payer: Self-pay

## 2021-12-26 MED ORDER — COLCHICINE 0.6 MG PO TABS: 0.6000 mg | ORAL_TABLET | Freq: Two times a day (BID) | ORAL | 0 refills | Status: DC

## 2021-12-26 MED ORDER — TAMSULOSIN HCL 0.4 MG PO CAPS: 0.4000 mg | ORAL_CAPSULE | Freq: Every day | ORAL | 1 refills | Status: DC

## 2021-12-30 ENCOUNTER — Ambulatory Visit (HOSPITAL_COMMUNITY)
Admission: RE | Admit: 2021-12-30 | Discharge: 2021-12-30 | Disposition: A | Discharge status: Home / Self Care | Payer: Medicare Other | Source: Ambulatory Visit | Attending: Urology | Admitting: Urology

## 2021-12-30 ENCOUNTER — Encounter (HOSPITAL_COMMUNITY)
Admission: RE | Disposition: A | Discharge status: Home / Self Care | Payer: Self-pay | Source: Ambulatory Visit | Attending: Urology

## 2021-12-30 ENCOUNTER — Encounter (HOSPITAL_COMMUNITY): Payer: Self-pay | Admitting: Urology

## 2021-12-30 DIAGNOSIS — I10 Essential (primary) hypertension: Secondary | ICD-10-CM | POA: Insufficient documentation

## 2021-12-30 DIAGNOSIS — F17200 Nicotine dependence, unspecified, uncomplicated: Secondary | ICD-10-CM | POA: Diagnosis not present

## 2021-12-30 DIAGNOSIS — I4811 Longstanding persistent atrial fibrillation: Secondary | ICD-10-CM | POA: Insufficient documentation

## 2021-12-30 DIAGNOSIS — Z538 Procedure and treatment not carried out for other reasons: Secondary | ICD-10-CM | POA: Insufficient documentation

## 2021-12-30 DIAGNOSIS — Z01818 Encounter for other preprocedural examination: Secondary | ICD-10-CM | POA: Insufficient documentation

## 2021-12-30 HISTORY — DX: Depression, unspecified: F32.A

## 2021-12-30 HISTORY — DX: Hallucinations, unspecified: R44.3

## 2021-12-30 LAB — PROTIME-INR
INR: 1.1 (ref 0.8–1.2)
Prothrombin Time: 14.3 seconds (ref 11.4–15.2)

## 2021-12-30 SURGERY — CYSTOSCOPY, WITH RETROGRADE PYELOGRAM
Anesthesia: General | Laterality: Bilateral

## 2021-12-30 MED ORDER — ONDANSETRON HCL 4 MG/2ML IJ SOLN
INTRAMUSCULAR | Status: AC
  Filled 2021-12-30: qty 2

## 2021-12-30 MED ORDER — CHLORHEXIDINE GLUCONATE 0.12 % MT SOLN
15.0000 mL | Freq: Once | OROMUCOSAL | Status: AC
  Administered 2021-12-30: 15 mL via OROMUCOSAL

## 2021-12-30 MED ORDER — LACTATED RINGERS IV SOLN: INTRAVENOUS | Status: DC

## 2021-12-30 MED ORDER — ORAL CARE MOUTH RINSE: 15.0000 mL | Freq: Once | OROMUCOSAL | Status: AC

## 2021-12-30 MED ORDER — CEFAZOLIN SODIUM-DEXTROSE 2-4 GM/100ML-% IV SOLN
2.0000 g | INTRAVENOUS | Status: DC
  Filled 2021-12-30: qty 100

## 2021-12-30 MED ORDER — LIDOCAINE HCL (PF) 2 % IJ SOLN
INTRAMUSCULAR | Status: AC
  Filled 2021-12-30: qty 5

## 2021-12-30 MED ORDER — FENTANYL CITRATE (PF) 100 MCG/2ML IJ SOLN
INTRAMUSCULAR | Status: AC
  Filled 2021-12-30: qty 2

## 2021-12-30 MED ORDER — DEXAMETHASONE SODIUM PHOSPHATE 10 MG/ML IJ SOLN
INTRAMUSCULAR | Status: AC
  Filled 2021-12-30: qty 1

## 2021-12-30 MED ORDER — PROPOFOL 10 MG/ML IV BOLUS
INTRAVENOUS | Status: AC
  Filled 2021-12-30: qty 20

## 2021-12-30 MED ORDER — MIDAZOLAM HCL 2 MG/2ML IJ SOLN
INTRAMUSCULAR | Status: AC
Start: 2021-12-30 — End: ?
  Filled 2021-12-30: qty 2

## 2021-12-30 SURGICAL SUPPLY — 13 items
BAG URO CATCHER STRL LF (MISCELLANEOUS) ×2 IMPLANT
CATH URETL OPEN END 6FR 70 (CATHETERS) IMPLANT
CLOTH BEACON ORANGE TIMEOUT ST (SAFETY) IMPLANT
GLOVE BIO SURGEON STRL SZ8 (GLOVE) ×2 IMPLANT
GLOVE BIOGEL PI IND STRL 6.5 (GLOVE) ×1 IMPLANT
GLOVE BIOGEL PI INDICATOR 6.5 (GLOVE) ×1
GLOVE SS BIOGEL STRL SZ 7 (GLOVE) ×1 IMPLANT
GLOVE SUPERSENSE BIOGEL SZ 7 (GLOVE) ×1
GOWN STRL REUS W/TWL LRG LVL3 (GOWN DISPOSABLE) ×6 IMPLANT
GUIDEWIRE STR DUAL SENSOR (WIRE) ×2 IMPLANT
NS IRRIG 1000ML POUR BTL (IV SOLUTION) IMPLANT
PACK CYSTO (CUSTOM PROCEDURE TRAY) ×2 IMPLANT
TUBING CONNECTING 10 (TUBING) ×2 IMPLANT

## 2021-12-30 NOTE — Progress Notes (Signed)
Pt did not follow instructions regarding pre-op instructions.  He is illiterate and unable to understand / remember which meds to take/ hold.  Case postponed prior to sedation.  Also Pt did not have a plan for someone to stay with him this evening. Coached Pt on having a responsible person to stay with him for 24 hours following surgery and to seek help with preop instructions in the future.  He verbalizes understanding.   ?

## 2022-01-03 ENCOUNTER — Other Ambulatory Visit: Payer: Self-pay | Admitting: Urology

## 2022-01-05 NOTE — Progress Notes (Signed)
Called pt to review med hx and go over preop instructions.  PT states no one is home iwht him at this time.  PT stated he is aware to take a shower the nite before and the am of procedure with hibiclens soap.  PT states medical hx has not changed since 12/19/21.  NO new updates.  PT states he as a Coumadin Clinic appat on 01/06/22 at 1000am.  PT is aware last dose of Coumadin on 01/11/22 per pt.  Preop nurse informed pt that in epic the appt time shows as 300pm for Coumadin Clinic.  PT states he is calling Coumadin Clinic to check on appt time for 01/06/22.  Preop nurse wiill call back and complete preop instructions with pt and another family member either 01/05/22 pm or 01/06/2022.  PT voiced understanding.  ?

## 2022-01-06 ENCOUNTER — Ambulatory Visit (INDEPENDENT_AMBULATORY_CARE_PROVIDER_SITE_OTHER): Payer: Medicare Other | Admitting: *Deleted

## 2022-01-06 DIAGNOSIS — Z5181 Encounter for therapeutic drug level monitoring: Secondary | ICD-10-CM | POA: Diagnosis not present

## 2022-01-06 DIAGNOSIS — I4891 Unspecified atrial fibrillation: Secondary | ICD-10-CM

## 2022-01-06 LAB — POCT INR: INR: 1.6 — AB (ref 2.0–3.0)

## 2022-01-06 NOTE — Patient Instructions (Signed)
Description   ?Today take an extra 1/2 tablet (total 2 tablets) then continue taking warfarin 1 tablet daily except for 1.5 tablets in [place].  Recheck INR in 1 week post procedure (normally 6 weeks).  Coumadin Clinic [phone].  ?  ?  ?

## 2022-01-09 NOTE — Progress Notes (Addendum)
Pt called back and stated his last dose of Coumadin would be on 01/11/22 and last dose of Plavix on 01/11/22.  PT aware to arrive at 0515am . Surgery time at 0730-0809am .  Pt has some of Hibiclens soap and aware to take a shower the nite before and am of surgery from chin to toes , not on face or private parts and to put clean sheets on bed.  Vickie, friend of him will be his driver and will stay with him for 24 hours after procedure.  PT stated he would not eat or drink anything after midnite nite before procedure on 01/16/22 and he will not take any meds that am.  ?PST nurse plans to call Vickie ( friend 0 and review preop instrucitons with her.    ?

## 2022-01-09 NOTE — Progress Notes (Signed)
Attempted to call pt to review preop instructions.  No answer LVMM and instructed pt to call preop nurse to review preop instructions for 01/16/2022.  ?

## 2022-01-11 NOTE — Progress Notes (Signed)
Attempted to call Charlyne Petrin ( sign other)  and speak with her.  No answer.  On number provided on demographic sheet.   ?

## 2022-01-13 ENCOUNTER — Other Ambulatory Visit: Payer: Self-pay | Admitting: Cardiovascular Disease

## 2022-01-13 DIAGNOSIS — I4891 Unspecified atrial fibrillation: Secondary | ICD-10-CM

## 2022-01-13 NOTE — Telephone Encounter (Signed)
Prescription refill request received for warfarin ?Lov: 11/25/21 ([place])  ?Next INR check: 01/24/22 ?Warfarin tablet strength: 29m ? ?Appropriate dose and refill sent to requested pharmacy.  ?

## 2022-01-13 NOTE — Progress Notes (Signed)
Spoke with pt to review preop instructions with  him for upcoming surgery on 01/16/2022.  Pt voiced to nurse over the phone that his last dose of Coumadin and Plavix was on 01/10/22.  PT aware he is to arrive at 0515am on 01/16/2022.  Vickie ( friend) to be his driver home and to stay with him after surgery per pt.  PT aware to take hibiclens shower nite before and am of procedure with hibiclens.  PT aware not to use on face or private parts and to wash chin to toes with hibiclens and to put fresh sheets on bed nite before surgery and to wear clean pajamas nite before surgery.  Pt aware to wear clean clothes to hospital.  PT states he is not going to take any meds am of surgery.  PT aware npo after midnite.  Marland Kitchen   ?

## 2022-01-15 NOTE — Anesthesia Preprocedure Evaluation (Addendum)
Anesthesia Evaluation  ?Patient identified by MRN, date of birth, ID band ?Patient awake ? ? ? ?Reviewed: ?Allergy & Precautions, NPO status , Patient's Chart, lab work & pertinent test results ? ?Airway ?Mallampati: III ? ?TM Distance: >3 FB ?Neck ROM: Full ? ? ? Dental ? ?(+) Missing, Edentulous Upper ?  ?Pulmonary ?Current SmokerPatient did not abstain from smoking.,  ?  ?Pulmonary exam normal ? ? ? ? ? ? ? Cardiovascular ?hypertension, Pt. on medications ?+ CAD and + Past MI  ?Normal cardiovascular exam+ dysrhythmias  ? ? ?  ?Neuro/Psych ?PSYCHIATRIC DISORDERS Depression Schizophrenia negative neurological ROS ?   ? GI/Hepatic ?Neg liver ROS, GERD  Medicated and Controlled,  ?Endo/Other  ?negative endocrine ROS ? Renal/GU ?Renal disease  ? ?  ?Musculoskeletal ? ?(+) Arthritis ,  ? Abdominal ?  ?Peds ? Hematology ?negative hematology ROS ?(+)   ?Anesthesia Other Findings ?GROSS HEMATURIA ? Reproductive/Obstetrics ? ?  ? ? ? ? ? ? ? ? ? ? ? ? ? ?  ?  ? ? ? ? ? ? ? ?Anesthesia Physical ?Anesthesia Plan ? ?ASA: 3 ? ?Anesthesia Plan: General  ? ?Post-op Pain Management:   ? ?Induction: Intravenous ? ?PONV Risk Score and Plan: 1 and Ondansetron, Dexamethasone, Treatment may vary due to age or medical condition and Midazolam ? ?Airway Management Planned: LMA ? ?Additional Equipment:  ? ?Intra-op Plan:  ? ?Post-operative Plan: Extubation in OR ? ?Informed Consent: I have reviewed the patients History and Physical, chart, labs and discussed the procedure including the risks, benefits and alternatives for the proposed anesthesia with the patient or authorized representative who has indicated his/her understanding and acceptance.  ? ? ? ?Dental advisory given ? ?Plan Discussed with: CRNA ? ?Anesthesia Plan Comments:   ? ? ? ? ? ?Anesthesia Quick Evaluation ? ?

## 2022-01-16 ENCOUNTER — Ambulatory Visit (HOSPITAL_COMMUNITY)
Admission: RE | Admit: 2022-01-16 | Discharge: 2022-01-16 | Disposition: A | Discharge status: Home / Self Care | Payer: Medicare Other | Attending: Urology | Admitting: Urology

## 2022-01-16 ENCOUNTER — Encounter (HOSPITAL_COMMUNITY)
Admission: RE | Disposition: A | Discharge status: Home / Self Care | Payer: Self-pay | Source: Home / Self Care | Attending: Urology

## 2022-01-16 ENCOUNTER — Other Ambulatory Visit: Payer: Self-pay

## 2022-01-16 ENCOUNTER — Ambulatory Visit (HOSPITAL_COMMUNITY): Payer: Medicare Other | Admitting: Anesthesiology

## 2022-01-16 ENCOUNTER — Ambulatory Visit (HOSPITAL_BASED_OUTPATIENT_CLINIC_OR_DEPARTMENT_OTHER): Payer: Medicare Other | Admitting: Anesthesiology

## 2022-01-16 ENCOUNTER — Encounter (HOSPITAL_COMMUNITY): Payer: Self-pay | Admitting: Urology

## 2022-01-16 ENCOUNTER — Ambulatory Visit (HOSPITAL_COMMUNITY): Payer: Medicare Other

## 2022-01-16 DIAGNOSIS — F32A Depression, unspecified: Secondary | ICD-10-CM | POA: Insufficient documentation

## 2022-01-16 DIAGNOSIS — Z79899 Other long term (current) drug therapy: Secondary | ICD-10-CM | POA: Diagnosis not present

## 2022-01-16 DIAGNOSIS — N201 Calculus of ureter: Secondary | ICD-10-CM

## 2022-01-16 DIAGNOSIS — I2510 Atherosclerotic heart disease of native coronary artery without angina pectoris: Secondary | ICD-10-CM | POA: Insufficient documentation

## 2022-01-16 DIAGNOSIS — K219 Gastro-esophageal reflux disease without esophagitis: Secondary | ICD-10-CM | POA: Diagnosis not present

## 2022-01-16 DIAGNOSIS — Z7901 Long term (current) use of anticoagulants: Secondary | ICD-10-CM | POA: Diagnosis not present

## 2022-01-16 DIAGNOSIS — I252 Old myocardial infarction: Secondary | ICD-10-CM | POA: Insufficient documentation

## 2022-01-16 DIAGNOSIS — Z01818 Encounter for other preprocedural examination: Secondary | ICD-10-CM

## 2022-01-16 DIAGNOSIS — F17200 Nicotine dependence, unspecified, uncomplicated: Secondary | ICD-10-CM | POA: Insufficient documentation

## 2022-01-16 DIAGNOSIS — R310 Gross hematuria: Secondary | ICD-10-CM | POA: Diagnosis not present

## 2022-01-16 DIAGNOSIS — I10 Essential (primary) hypertension: Secondary | ICD-10-CM | POA: Diagnosis not present

## 2022-01-16 HISTORY — PX: URETEROSCOPY: SHX842

## 2022-01-16 HISTORY — PX: CYSTOSCOPY W/ RETROGRADES: SHX1426

## 2022-01-16 LAB — PROTIME-INR
INR: 1.1 (ref 0.8–1.2)
Prothrombin Time: 14.2 seconds (ref 11.4–15.2)

## 2022-01-16 SURGERY — CYSTOSCOPY, WITH RETROGRADE PYELOGRAM
Anesthesia: General | Laterality: Right

## 2022-01-16 MED ORDER — ONDANSETRON HCL 4 MG/2ML IJ SOLN: 4.0000 mg | Freq: Once | INTRAMUSCULAR | Status: DC | PRN

## 2022-01-16 MED ORDER — IOHEXOL 300 MG/ML  SOLN
INTRAMUSCULAR | Status: DC | PRN
  Administered 2022-01-16: 17 mL

## 2022-01-16 MED ORDER — ONDANSETRON HCL 4 MG/2ML IJ SOLN
INTRAMUSCULAR | Status: AC
  Filled 2022-01-16: qty 2

## 2022-01-16 MED ORDER — LIDOCAINE 2% (20 MG/ML) 5 ML SYRINGE
INTRAMUSCULAR | Status: DC | PRN
  Administered 2022-01-16: 60 mg via INTRAVENOUS

## 2022-01-16 MED ORDER — ORAL CARE MOUTH RINSE: 15.0000 mL | Freq: Once | OROMUCOSAL | Status: AC

## 2022-01-16 MED ORDER — OXYCODONE HCL 5 MG PO TABS: 5.0000 mg | ORAL_TABLET | ORAL | 0 refills | Status: DC | PRN

## 2022-01-16 MED ORDER — SODIUM CHLORIDE 0.9 % IR SOLN
Status: DC | PRN
  Administered 2022-01-16: 3000 mL

## 2022-01-16 MED ORDER — 0.9 % SODIUM CHLORIDE (POUR BTL) OPTIME
TOPICAL | Status: DC | PRN
Start: 2022-01-16 — End: 2022-01-16
  Administered 2022-01-16: 1000 mL

## 2022-01-16 MED ORDER — ACETAMINOPHEN 500 MG PO TABS
1000.0000 mg | ORAL_TABLET | Freq: Once | ORAL | Status: AC
  Administered 2022-01-16: 1000 mg via ORAL
  Filled 2022-01-16: qty 2

## 2022-01-16 MED ORDER — FENTANYL CITRATE PF 50 MCG/ML IJ SOSY: 25.0000 ug | PREFILLED_SYRINGE | INTRAMUSCULAR | Status: DC | PRN

## 2022-01-16 MED ORDER — CEFAZOLIN SODIUM-DEXTROSE 2-4 GM/100ML-% IV SOLN
INTRAVENOUS | Status: AC
  Filled 2022-01-16: qty 100

## 2022-01-16 MED ORDER — LIDOCAINE HCL (PF) 2 % IJ SOLN
INTRAMUSCULAR | Status: AC
  Filled 2022-01-16: qty 5

## 2022-01-16 MED ORDER — PROPOFOL 10 MG/ML IV BOLUS
INTRAVENOUS | Status: AC
  Filled 2022-01-16: qty 20

## 2022-01-16 MED ORDER — CEFAZOLIN SODIUM-DEXTROSE 2-3 GM-%(50ML) IV SOLR
INTRAVENOUS | Status: DC | PRN
  Administered 2022-01-16: 2 g via INTRAVENOUS

## 2022-01-16 MED ORDER — MIDAZOLAM HCL 2 MG/2ML IJ SOLN
INTRAMUSCULAR | Status: AC
  Filled 2022-01-16: qty 2

## 2022-01-16 MED ORDER — GLYCOPYRROLATE 0.2 MG/ML IJ SOLN
INTRAMUSCULAR | Status: DC | PRN
  Administered 2022-01-16: .2 mg via INTRAVENOUS

## 2022-01-16 MED ORDER — AMISULPRIDE (ANTIEMETIC) 5 MG/2ML IV SOLN: 10.0000 mg | Freq: Once | INTRAVENOUS | Status: DC | PRN

## 2022-01-16 MED ORDER — MIDAZOLAM HCL 5 MG/5ML IJ SOLN
INTRAMUSCULAR | Status: DC | PRN
  Administered 2022-01-16: 60 mg via INTRAVENOUS
  Administered 2022-01-16: 2 mg via INTRAVENOUS

## 2022-01-16 MED ORDER — LACTATED RINGERS IV SOLN: INTRAVENOUS | Status: DC

## 2022-01-16 MED ORDER — ONDANSETRON HCL 4 MG/2ML IJ SOLN
INTRAMUSCULAR | Status: DC | PRN
  Administered 2022-01-16: 4 mg via INTRAVENOUS

## 2022-01-16 MED ORDER — FENTANYL CITRATE (PF) 100 MCG/2ML IJ SOLN
INTRAMUSCULAR | Status: DC | PRN
  Administered 2022-01-16: 25 ug via INTRAVENOUS

## 2022-01-16 MED ORDER — CHLORHEXIDINE GLUCONATE 0.12 % MT SOLN
15.0000 mL | Freq: Once | OROMUCOSAL | Status: AC
  Administered 2022-01-16: 15 mL via OROMUCOSAL

## 2022-01-16 MED ORDER — PROPOFOL 10 MG/ML IV BOLUS
INTRAVENOUS | Status: DC | PRN
  Administered 2022-01-16 (×2): 200 mg via INTRAVENOUS

## 2022-01-16 MED ORDER — DEXAMETHASONE SODIUM PHOSPHATE 10 MG/ML IJ SOLN
INTRAMUSCULAR | Status: AC
  Filled 2022-01-16: qty 1

## 2022-01-16 MED ORDER — DEXAMETHASONE SODIUM PHOSPHATE 10 MG/ML IJ SOLN
INTRAMUSCULAR | Status: DC | PRN
  Administered 2022-01-16: 10 mg via INTRAVENOUS

## 2022-01-16 MED ORDER — FENTANYL CITRATE (PF) 100 MCG/2ML IJ SOLN
INTRAMUSCULAR | Status: AC
  Filled 2022-01-16: qty 2

## 2022-01-16 SURGICAL SUPPLY — 36 items
BAG DRN RND TRDRP ANRFLXCHMBR (UROLOGICAL SUPPLIES)
BAG URINE DRAIN 2000ML AR STRL (UROLOGICAL SUPPLIES) ×2 IMPLANT
BAG URO CATCHER STRL LF (MISCELLANEOUS) ×3 IMPLANT
BASKET LASER NITINOL 1.9FR (BASKET) IMPLANT
BASKET ZERO TIP NITINOL 2.4FR (BASKET) ×1 IMPLANT
BSKT STON RTRVL 120 1.9FR (BASKET)
BSKT STON RTRVL ZERO TP 2.4FR (BASKET) ×2
CATH FOLEY 3WAY 30CC 24FR (CATHETERS)
CATH URETL OPEN END 6FR 70 (CATHETERS) ×3 IMPLANT
CATH URTH STD 24FR FL 3W 2 (CATHETERS) ×2 IMPLANT
CLOTH BEACON ORANGE TIMEOUT ST (SAFETY) ×3 IMPLANT
DRAPE FOOT SWITCH (DRAPES) ×2 IMPLANT
EXTRACTOR STONE 1.7FRX115CM (UROLOGICAL SUPPLIES) IMPLANT
GLOVE BIO SURGEON STRL SZ7.5 (GLOVE) ×3 IMPLANT
GLOVE BIO SURGEON STRL SZ8 (GLOVE) ×3 IMPLANT
GOWN STRL REUS W/ TWL XL LVL3 (GOWN DISPOSABLE) ×2 IMPLANT
GOWN STRL REUS W/TWL XL LVL3 (GOWN DISPOSABLE) ×3
GUIDEWIRE ANG ZIPWIRE 038X150 (WIRE) IMPLANT
GUIDEWIRE STR DUAL SENSOR (WIRE) ×3 IMPLANT
HOLDER FOLEY CATH W/STRAP (MISCELLANEOUS) ×2 IMPLANT
KIT TURNOVER KIT A (KITS) IMPLANT
LASER FIB FLEXIVA PULSE ID 365 (Laser) IMPLANT
LOOP CUT BIPOLAR 24F LRG (ELECTROSURGICAL) ×2 IMPLANT
MANIFOLD NEPTUNE II (INSTRUMENTS) ×3 IMPLANT
NS IRRIG 1000ML POUR BTL (IV SOLUTION) ×1 IMPLANT
PACK CYSTO (CUSTOM PROCEDURE TRAY) ×3 IMPLANT
PENCIL SMOKE EVACUATOR (MISCELLANEOUS) IMPLANT
SHEATH NAVIGATOR HD 11/13X28 (SHEATH) IMPLANT
SHEATH NAVIGATOR HD 11/13X36 (SHEATH) IMPLANT
SYR 30ML LL (SYRINGE) ×2 IMPLANT
SYR TOOMEY IRRIG 70ML (MISCELLANEOUS)
SYRINGE TOOMEY IRRIG 70ML (MISCELLANEOUS) ×2 IMPLANT
TRACTIP FLEXIVA PULS ID 200XHI (Laser) IMPLANT
TRACTIP FLEXIVA PULSE ID 200 (Laser) ×3
TUBING CONNECTING 10 (TUBING) ×3 IMPLANT
TUBING UROLOGY SET (TUBING) ×3 IMPLANT

## 2022-01-16 NOTE — Anesthesia Postprocedure Evaluation (Signed)
Anesthesia Post Note ? ?Patient: Jesse Franklin ? ?Procedure(s) Performed: CYSTOSCOPY WITH RETROGRADE PYELOGRAM (Bilateral) ?URETEROSCOPY WITH LASER LITHOTRIPSY STONE BASKET EXTRACTION, STENT PLACEMENT (Right) ? ?  ? ?Patient location during evaluation: PACU ?Anesthesia Type: General ?Level of consciousness: awake ?Pain management: pain level controlled ?Vital Signs Assessment: post-procedure vital signs reviewed and stable ?Respiratory status: spontaneous breathing, nonlabored ventilation, respiratory function stable and patient connected to nasal cannula oxygen ?Cardiovascular status: blood pressure returned to baseline and stable ?Postop Assessment: no apparent nausea or vomiting ?Anesthetic complications: no ? ? ?No notable events documented. ? ?Last Vitals:  ?Vitals:  ? 01/16/22 0830 01/16/22 0845  ?BP: 134/84 (!) 147/75  ?Pulse: (!) 46 67  ?Resp: (!) 31 (!) 21  ?Temp:    ?SpO2: 98% 92%  ?  ?Last Pain:  ?Vitals:  ? 01/16/22 0845  ?TempSrc:   ?PainSc: 0-No pain  ? ? ?  ?  ?  ?  ?  ?  ? ?Laticha Ferrucci P Olene Godfrey ? ? ? ? ?

## 2022-01-16 NOTE — Anesthesia Procedure Notes (Signed)
Procedure Name: LMA Insertion ?Date/Time: 01/16/2022 7:37 AM ?Performed by: Lind Covert, CRNA ?Pre-anesthesia Checklist: Patient identified, Emergency Drugs available, Suction available, Patient being monitored and Timeout performed ?Patient Re-evaluated:Patient Re-evaluated prior to induction ?Oxygen Delivery Method: Circle system utilized ?Preoxygenation: Pre-oxygenation with 100% oxygen ?Induction Type: IV induction ?LMA: LMA inserted ?LMA Size: 4.0 ?Tube type: Oral ?Number of attempts: 1 ?Placement Confirmation: positive ETCO2 and breath sounds checked- equal and bilateral ?Tube secured with: Tape ?Dental Injury: Teeth and Oropharynx as per pre-operative assessment  ? ? ? ? ?

## 2022-01-16 NOTE — Op Note (Signed)
Operative Note ? ?Preoperative diagnosis:  ?1.  Gross hematuria ? ?Postoperative diagnosis: ?1.  Right ureteral calculus ? ?Procedure(s): ?1.  Cystoscopy with bilateral retrograde pyelogram, right ureteroscopy with laser lithotripsy and stone extraction, ureteral stent placement ? ?Surgeon: Link Snuffer, MD ? ?Assistants: None ? ?Anesthesia: General ? ?Complications: None immediate ? ?EBL: Minimal ? ?Specimens: ?1.  None ? ?Drains/Catheters: ?1.  6 x 26 double-J ureteral stent with a string ? ?Intraoperative findings: 1.  Normal anterior urethra 2.  Nonobstructing prostate 3.  Bladder mucosa normal without any tumors.  No stones.  Bilateral ureteral orifices in normal position. ?4.  Left retrograde pyelogram without any filling defect or stone. ?5.  Right retrograde pyelogram revealed a filling defect in the distal right ureter.  No other filling defect seen.  There was no upstream hydronephrosis.  Ureteroscopy identified a distal 5 to 6 mm ureteral calculus that was fragmented to smaller fragments and basket extracted. ? ?Indication: 71 year old male with gross hematuria underwent CT scan of the abdomen and pelvis with and without contrast.  No obvious abnormality was initially seen on CT.  In retrospect, he does have a calcification within the pelvis that is consistent with the stone seen today. ? ?Description of procedure: ? ?The patient was identified and consent was obtained.  The patient was taken to the operating room and placed in the supine position.  The patient was placed under general anesthesia.  Perioperative antibiotics were administered.  The patient was placed in dorsal lithotomy.  Patient was prepped and draped in a standard sterile fashion and a timeout was performed. ? ?A 21 French rigid cystoscope was advanced into the urethra and into the bladder.  Complete cystoscopy was performed with findings noted above.  I exchanged the 30 degree lens for a 70 degree lens.  No tumors were seen.  I then  replaced it with a 30 degree lens and inserted a open-ended ureteral catheter into the left ureteral orifice and shot a retrograde pyelogram with no abnormal findings.  The same was done on the right.  There was a filling defect in the distal right ureter.  I therefore advanced a wire up the right ureter and into the kidney.  Semirigid ureteroscopy was performed alongside the wire.  He had a ureteral calculus that was laser fragmented to smaller fragments.  These were then basket extracted.  I then advanced the scope up to the renal pelvis.  No other calculi were seen.  On the retrograde, there was no filling defects within the kidney and no hydronephrosis.  I withdrew the scope visualizing the ureter upon removal.  There was some ureteral edema from stone treatment but no obvious injury to the ureter.  I advanced a 6 x 26 double-J ureteral stent over the wire and placed it fluoroscopically.  Fluoroscopy confirmed a proximal and distal curl in appropriate position.  This include the operation.  The string was secured to the penis.  Patient tolerated procedure well stable postoperative. ? ?Plan: He may remove the stent on Thursday morning. ? ?

## 2022-01-16 NOTE — Transfer of Care (Signed)
Immediate Anesthesia Transfer of Care Note ? ?Patient: Jesse Franklin ? ?Procedure(s) Performed: CYSTOSCOPY WITH RETROGRADE PYELOGRAM (Bilateral) ?URETEROSCOPY (Right) ? ?Patient Location: PACU ? ?Anesthesia Type:General ? ?Level of Consciousness: sedated ? ?Airway & Oxygen Therapy: Patient Spontanous Breathing and Patient connected to face mask oxygen ? ?Post-op Assessment: Report given to RN ? ?Post vital signs: stable ? ?Last Vitals:  ?Vitals Value Taken Time  ?BP    ?Temp    ?Pulse 35 01/16/22 0818  ?Resp 15 01/16/22 0818  ?SpO2 100 % 01/16/22 0818  ?Vitals shown include unvalidated device data. ? ?Last Pain:  ?Vitals:  ? 01/16/22 0544  ?TempSrc: Oral  ?   ? ?  ? ?Complications: No notable events documented. ?

## 2022-01-16 NOTE — H&P (Signed)
CC/HPI: CC: Gross hematuria  ?HPI:  ?10/14/2021  ?71 year old male who is on Coumadin presents with a primary complaint of gross hematuria. This occurred a couple of times and was painless in nature. He passed a couple blood clots. This has resolved. He denies any history nephrolithiasis. He is due for a PSA. Poor historian.  ? ?11/17/2021  ?Patient underwent a CT hematuria protocol. This revealed no evidence of genitourinary malignancy. He denies any interval gross hematuria. No significant voiding complaints.  ? ?01/16/2022 ?Patient presents today for cystoscopy with bilateral retrograde pyelogram with possible interventions. ?  ?ALLERGIES: None  ? ?MEDICATIONS: Allopurinol 100 mg tablet  ?Plavix 75 mg tablet  ?Tamsulosin Hcl 0.4 mg capsule  ?Warfarin Sodium 5 mg tablet  ?Colchicine 0.6 mg tablet  ?Famotidine 20 mg tablet  ?Pantoprazole Sodium 40 mg tablet, delayed release  ?Rosuvastatin Calcium 40 mg tablet  ?  ? ?GU PSH: Locm 300-399Mg/Ml Iodine,1Ml - 11/02/2021 ? ?  ?   ?PSH Notes: Hand surgery, heart attack  ? ?NON-GU PSH: None  ? ?GU PMH: Gross hematuria - 11/02/2021, - 10/14/2021 ?BPH w/o LUTS - 10/14/2021 ?Encounter for Prostate Cancer screening - 10/14/2021 ?  ?   ?PMH Notes: kidney failure , liver diease  ? ?NON-GU PMH: GERD ?Gout ?Heart disease, unspecified ?Hypercholesterolemia ?Hypertension ?Right heart failure, unspecified ?  ? ?FAMILY HISTORY: None  ? ?SOCIAL HISTORY: Marital Status: Divorced ?Preferred Language: [place]; Ethnicity: Not Hispanic Or Latino; Race: Black or African American ?Current Smoking Status: Patient smokes.  ? ?Tobacco Use Assessment Completed: Used Tobacco in last 30 days? ?Does not use smokeless tobacco. ?Has never drank.  ?Does not use drugs. ?Drinks 1 caffeinated drink per day. ?  ? ?REVIEW OF SYSTEMS:    ?GU Review Male:   Patient reports frequent urination, get up at night to urinate, leakage of urine, and stream starts and stops. Patient denies hard to postpone urination, burning/ pain  with urination, trouble starting your stream, have to strain to urinate , erection problems, and penile pain.  ?Gastrointestinal (Upper):   Patient denies nausea, vomiting, and indigestion/ heartburn.  ?Gastrointestinal (Lower):   Patient denies diarrhea and constipation.  ?Constitutional:   Patient denies fever, night sweats, weight loss, and fatigue.  ?Skin:   Patient denies skin rash/ lesion and itching.  ?Eyes:   Patient denies double vision and blurred vision.  ?Ears/ Nose/ Throat:   Patient denies sore throat and sinus problems.  ?Hematologic/Lymphatic:   Patient denies swollen glands and easy bruising.  ?Cardiovascular:   Patient denies leg swelling and chest pains.  ?Respiratory:   Patient denies cough and shortness of breath.  ?Endocrine:   Patient denies excessive thirst.  ?Musculoskeletal:   Patient denies back pain and joint pain.  ?Neurological:   Patient denies headaches and dizziness.  ?Psychologic:   Patient denies depression and anxiety.  ? ?VITAL SIGNS: None  ? ?Complexity of Data:  ?Source Of History:  Patient  ? ?  ? 10/14/21  ?PSA  ?Total PSA 1.66 ng/mL  ? ? ? ?ASSESSMENT:  ?    ICD-10 Details  ?1 GU:   BPH w/o LUTS - N40.0 Chronic, Stable  ?2   Gross hematuria - R31.0 Chronic, Stable, Resolved  ?  ? ?PLAN:    ? ?      Orders ?Labs Urine Culture  ? ? ?      Document ?Letter(s):  Created for Patient: Clinical Summary  ? ? ?     Notes:   CT negative  for malignancy or stones.  ? ?Recommend cystoscopy. He declined office cystoscopy. Therefore, proceed with cystoscopy with bilateral retrograde pyelogram, possible interventions such as TURBT or ureteroscopy. Risk and benefits discussed. He will need clearance to come off blood thinners.  ? ? ? ?

## 2022-01-16 NOTE — Discharge Instructions (Addendum)
[facility] ?[phone] ?Post Ureteroscopy With or Without Stent Instructions ? ?You have a stent that is on the string.  On Thursday morning at about 7 AM, remove the stent by gently pulling the string.  The stent will be about 1 foot long. ? ?You may resume your blood thinners and heart medications.  All home medications may be resumed ? ?Definitions: ? ?Ureter: The duct that transports urine from the kidney to the bladder. ?Stent:   A plastic hollow tube that is placed into the ureter, from the kidney to the                 bladder to prevent the ureter from swelling shut. ? ?GENERAL INSTRUCTIONS: ? ?Despite the fact that no skin incisions were used, the area around the ureter and bladder is raw and irritated. The stent is a foreign body which will further irritate the bladder wall. This irritation is manifested by increased frequency of urination, both day and night, and by an increase in the urge to urinate. In some, the urge to urinate is present almost always. Sometimes the urge is strong enough that you may not be able to stop yourself from urinating. The only real cure is to remove the stent and then give time for the bladder wall to heal which can't be done until the danger of the ureter swelling shut has passed, which varies. ? ?You may see some blood in your urine while the stent is in place and a few days afterwards. Do not be alarmed, even if the urine was clear for a while. Get off your feet and drink lots of fluids until clearing occurs. If you start to pass clots or don't improve, call us. ? ?DIET: ?You may return to your normal diet immediately. Because of the raw surface of your bladder, alcohol, spicy foods, acid type foods and drinks with caffeine may cause irritation or frequency and should be used in moderation. To keep your urine flowing freely and to avoid constipation, drink plenty of fluids during the day ( 8-10 glasses ). ?Tip: Avoid cranberry juice because it is very  acidic. ? ?ACTIVITY: ?Your physical activity doesn't need to be restricted. However, if you are very active, you may see some blood in your urine. We suggest that you reduce your activity under these circumstances until the bleeding has stopped. ? ?BOWELS: ?It is important to keep your bowels regular during the postoperative period. Straining with bowel movements can cause bleeding. A bowel movement every other day is reasonable. Use a mild laxative if needed, such as Milk of Magnesia 2-3 tablespoons, or 2 Dulcolax tablets. Call if you continue to have problems. If you have been taking narcotics for pain, before, during or after your surgery, you may be constipated. Take a laxative if necessary. ? ? ?MEDICATION: ?You should resume your pre-surgery medications unless told not to. ?You may take oxybutynin or flomax if prescribed for bladder spasms or discomfort from the stent ?Take pain medication as directed for pain refractory to conservative management ? ?PROBLEMS YOU SHOULD REPORT TO [place]: ?Fevers over 100.5 Fahrenheit. ?Heavy bleeding, or clots ( See above notes about blood in urine ). ?Inability to urinate. ?Drug reactions ( hives, rash, nausea, vomiting, diarrhea ). ?Severe burning or pain with urination that is not improving. ? ?

## 2022-01-17 ENCOUNTER — Encounter (HOSPITAL_COMMUNITY): Payer: Self-pay | Admitting: Urology

## 2022-01-26 ENCOUNTER — Ambulatory Visit (INDEPENDENT_AMBULATORY_CARE_PROVIDER_SITE_OTHER): Payer: Medicare Other | Admitting: *Deleted

## 2022-01-26 DIAGNOSIS — I4891 Unspecified atrial fibrillation: Secondary | ICD-10-CM | POA: Diagnosis not present

## 2022-01-26 DIAGNOSIS — Z5181 Encounter for therapeutic drug level monitoring: Secondary | ICD-10-CM | POA: Diagnosis not present

## 2022-01-26 LAB — POCT INR: INR: 2.1 (ref 2.0–3.0)

## 2022-01-26 NOTE — Patient Instructions (Signed)
Description   Continue taking warfarin 1 tablet daily except for 1.5 tablets in [place].  Recheck INR in 4 weeks (normally 6 weeks). [facility] [phone].

## 2022-01-30 ENCOUNTER — Other Ambulatory Visit: Payer: Self-pay

## 2022-01-31 MED ORDER — ROSUVASTATIN CALCIUM 40 MG PO TABS: 40.0000 mg | ORAL_TABLET | Freq: Every day | ORAL | 1 refills | Status: DC

## 2022-02-07 DIAGNOSIS — N281 Cyst of kidney, acquired: Secondary | ICD-10-CM | POA: Diagnosis not present

## 2022-02-07 DIAGNOSIS — R310 Gross hematuria: Secondary | ICD-10-CM | POA: Diagnosis not present

## 2022-02-07 DIAGNOSIS — N201 Calculus of ureter: Secondary | ICD-10-CM | POA: Diagnosis not present

## 2022-02-08 ENCOUNTER — Other Ambulatory Visit: Payer: Self-pay | Admitting: Urology

## 2022-02-09 NOTE — Patient Instructions (Addendum)
DUE TO COVID-19 ONLY TWO VISITORS  (aged 69 and older)  ARE ALLOWED TO COME WITH YOU AND STAY IN THE WAITING ROOM ONLY DURING PRE OP AND PROCEDURE.   **NO VISITORS ARE ALLOWED IN THE SHORT STAY AREA OR RECOVERY ROOM!!**  IF YOU WILL BE ADMITTED INTO THE HOSPITAL YOU ARE ALLOWED ONLY FOUR SUPPORT PEOPLE DURING VISITATION HOURS ONLY (7 AM -8PM)   The support person(s) must pass our screening, gel in and out, and wear a mask at all times, including in the patient's room. Patients must also wear a mask when staff or their support person are in the room. Visitors GUEST BADGE MUST BE WORN VISIBLY  One adult visitor may remain with you overnight and MUST be in the room by 8 P.M.     Your procedure is scheduled on: 02/13/22   Report to [facility] Main Entrance    Report to admitting at 12:30 PM   Call this number if you have problems the morning of surgery [phone]   Do not eat food :After Midnight.   After Midnight you may have the following liquids until _8:45AM/ DAY OF SURGERY  Water Black Coffee (sugar ok, NO MILK/CREAM OR CREAMERS)  Tea (sugar ok, NO MILK/CREAM OR CREAMERS) regular and decaf                             Plain Jell-O (NO RED)                                           Fruit ices (not with fruit pulp, NO RED)                                     Popsicles (NO RED)                                                                  Juice: apple, WHITE grape, WHITE cranberry Sports drinks like Gatorade (NO RED) Clear broth(vegetable,chicken,beef)         If you have questions, please contact your surgeon's office.   FOLLOW BOWEL PREP AND ANY ADDITIONAL PRE OP INSTRUCTIONS YOU RECEIVED FROM YOUR SURGEON'S OFFICE!!!     Oral Hygiene is also important to reduce your risk of infection.                                    Remember - BRUSH YOUR TEETH THE MORNING OF SURGERY WITH YOUR REGULAR TOOTHPASTE   Do NOT smoke after Midnight   Take these medicines the morning  of surgery with A SIP OF WATER: Amlodipine, Allopurinol, Pantoprazole   Bring CPAP mask and tubing day of surgery.                              You may not have any metal on your body including jewelry, and body piercing  Do not wear  lotions, powders, perfumes/cologne, or deodorant              Men may shave face and neck.   Do not bring valuables to the hospital. [place].   Contacts, dentures or bridgework may not be worn into surgery.     Patients discharged on the day of surgery will not be allowed to drive home.  Someone NEEDS to stay with you for the first 24 hours after anesthesia.   Special Instructions: Bring a copy of your healthcare power of attorney and living will documents  the day of surgery if you haven't scanned them before.              Please read over the following fact sheets you were given: IF YOU HAVE QUESTIONS ABOUT YOUR PRE-OP INSTRUCTIONS PLEASE CALL [phone]     [facility] - Preparing for Surgery Before surgery, you can play an important role.  Because skin is not sterile, your skin needs to be as free of germs as possible.  You can reduce the number of germs on your skin by washing with CHG (chlorahexidine gluconate) soap before surgery.  CHG is an antiseptic cleaner which kills germs and bonds with the skin to continue killing germs even after washing. Please DO NOT use if you have an allergy to CHG or antibacterial soaps.  If your skin becomes reddened/irritated stop using the CHG and inform your nurse when you arrive at Short Stay. You may shave your face/neck. Please follow these instructions carefully:  1.  Shower with CHG Soap the night before surgery and the  morning of Surgery.  2.  If you choose to wash your hair, wash your hair first as usual with your  normal  shampoo.  3.  After you shampoo, rinse your hair and body thoroughly to remove the  shampoo.                            4.   Use CHG as you would any other liquid soap.  You can apply chg directly  to the skin and wash                       Gently with a scrungie or clean washcloth.  5.  Apply the CHG Soap to your body ONLY FROM THE NECK DOWN.   Do not use on face/ open                           Wound or open sores. Avoid contact with eyes, ears mouth and genitals (private parts).                       Wash face,  Genitals (private parts) with your normal soap.             6.  Wash thoroughly, paying special attention to the area where your surgery  will be performed.  7.  Thoroughly rinse your body with warm water from the neck down.  8.  DO NOT shower/wash with your normal soap after using and rinsing off  the CHG Soap.                9.  Pat yourself dry with a clean  towel.            10.  Wear clean pajamas.            11.  Place clean sheets on your bed the night of your first shower and do not  sleep with pets. Day of Surgery : Do not apply any lotions/deodorants the morning of surgery.  Please wear clean clothes to the hospital/surgery center.  FAILURE TO FOLLOW THESE INSTRUCTIONS MAY RESULT IN THE CANCELLATION OF YOUR SURGERY    ________________________________________________________________________

## 2022-02-10 ENCOUNTER — Telehealth: Payer: Self-pay | Admitting: Cardiovascular Disease

## 2022-02-10 ENCOUNTER — Other Ambulatory Visit: Payer: Self-pay

## 2022-02-10 ENCOUNTER — Encounter (HOSPITAL_COMMUNITY)
Admission: RE | Admit: 2022-02-10 | Discharge: 2022-02-10 | Disposition: A | Discharge status: Home / Self Care | Payer: Medicare Other | Source: Ambulatory Visit | Attending: Urology | Admitting: Urology

## 2022-02-10 ENCOUNTER — Encounter (HOSPITAL_COMMUNITY): Payer: Self-pay

## 2022-02-10 VITALS — BP 129/75 | HR 46 | Temp 98.1°F | Resp 18 | Ht 65.5 in | Wt 150.0 lb

## 2022-02-10 DIAGNOSIS — I272 Pulmonary hypertension, unspecified: Secondary | ICD-10-CM | POA: Insufficient documentation

## 2022-02-10 DIAGNOSIS — Z01812 Encounter for preprocedural laboratory examination: Secondary | ICD-10-CM | POA: Diagnosis not present

## 2022-02-10 DIAGNOSIS — Z7901 Long term (current) use of anticoagulants: Secondary | ICD-10-CM | POA: Insufficient documentation

## 2022-02-10 HISTORY — DX: Personal history of urinary calculi: Z87.442

## 2022-02-10 LAB — BASIC METABOLIC PANEL
Anion gap: 6 (ref 5–15)
BUN: 14 mg/dL (ref 8–23)
CO2: 24 mmol/L (ref 22–32)
Calcium: 9.5 mg/dL (ref 8.9–10.3)
Chloride: 113 mmol/L — ABNORMAL HIGH (ref 98–111)
Creatinine, Ser: 1.38 mg/dL — ABNORMAL HIGH (ref 0.61–1.24)
GFR, Estimated: 56 mL/min — ABNORMAL LOW (ref >60)
Glucose, Bld: 90 mg/dL (ref 70–99)
Potassium: 4 mmol/L (ref 3.5–5.1)
Sodium: 143 mmol/L (ref 135–145)

## 2022-02-10 LAB — CBC
HCT: 37.4 % — ABNORMAL LOW (ref 39.0–52.0)
Hemoglobin: 12.7 g/dL — ABNORMAL LOW (ref 13.0–17.0)
MCH: 31.8 pg (ref 26.0–34.0)
MCHC: 34 g/dL (ref 30.0–36.0)
MCV: 93.7 fL (ref 80.0–100.0)
Platelets: 187 10*3/uL (ref 150–400)
RBC: 3.99 MIL/uL — ABNORMAL LOW (ref 4.22–5.81)
RDW: 13.8 % (ref 11.5–15.5)
WBC: 3.9 10*3/uL — ABNORMAL LOW (ref 4.0–10.5)
nRBC: 0 % (ref 0.0–0.2)

## 2022-02-10 NOTE — Progress Notes (Signed)
Anesthesia note:  Bowel prep reminder:NA  PCP - Dr. Leodis Rains Cardiologist -Dr. Joaquim Nam Other-   Chest x-ray - no EKG - 11/25/21-epic Stress Test - 2007-epic ECHO - 05/22/18-epic Cardiac Cath - 2005  Pacemaker/ICD device last checked:NA  Sleep Study - no CPAP -   Pt is pre diabetic-NA Fasting Blood Sugar -  Checks Blood Sugar _____  Blood Thinner:Plavix and coumadin/ Dr. Acie Fredrickson Blood Thinner Instructions:hold both until after surgery Aspirin Instructions: Last Dose:Plavix 5/31, Coumadin 6/2  Anesthesia review: yes  Patient denies shortness of breath, fever, cough and chest pain at PAT appointment Pt was confused about his blood thinners.  His reading skills are very limited and his understanding of his medications are limited.   Patient verbalized understanding of instructions that were given to them at the PAT appointment. Patient was also instructed that they will need to review over the PAT instructions again at home before surgery. Phone calls were make to his Dr.s. he will hold the coumadin and plavix for surgery.

## 2022-02-10 NOTE — Telephone Encounter (Signed)
Calling to f/u on Clearance that was done for pt back in March. Needing to know about pt stopping Coumadin and Plavix. Procedure scheduled for 02/13/22. Please advise

## 2022-02-10 NOTE — Telephone Encounter (Signed)
Placed call back to [facility] and she advised that they didn't need clearance from Cardiology, per Urology.   She did thank me for calling back.

## 2022-02-13 ENCOUNTER — Ambulatory Visit (HOSPITAL_COMMUNITY): Payer: Medicare Other | Admitting: Physician Assistant

## 2022-02-13 ENCOUNTER — Encounter (HOSPITAL_COMMUNITY)
Admission: RE | Disposition: A | Discharge status: Home / Self Care | Payer: Self-pay | Source: Ambulatory Visit | Attending: Urology

## 2022-02-13 ENCOUNTER — Ambulatory Visit (HOSPITAL_COMMUNITY)
Admission: RE | Admit: 2022-02-13 | Discharge: 2022-02-13 | Disposition: A | Discharge status: Home / Self Care | Payer: Medicare Other | Source: Ambulatory Visit | Attending: Urology | Admitting: Urology

## 2022-02-13 ENCOUNTER — Ambulatory Visit (HOSPITAL_BASED_OUTPATIENT_CLINIC_OR_DEPARTMENT_OTHER): Payer: Medicare Other | Admitting: Anesthesiology

## 2022-02-13 ENCOUNTER — Encounter (HOSPITAL_COMMUNITY): Payer: Self-pay | Admitting: Urology

## 2022-02-13 DIAGNOSIS — I252 Old myocardial infarction: Secondary | ICD-10-CM | POA: Diagnosis not present

## 2022-02-13 DIAGNOSIS — I10 Essential (primary) hypertension: Secondary | ICD-10-CM | POA: Diagnosis not present

## 2022-02-13 DIAGNOSIS — R310 Gross hematuria: Secondary | ICD-10-CM | POA: Diagnosis not present

## 2022-02-13 DIAGNOSIS — Z87442 Personal history of urinary calculi: Secondary | ICD-10-CM | POA: Diagnosis not present

## 2022-02-13 DIAGNOSIS — I2510 Atherosclerotic heart disease of native coronary artery without angina pectoris: Secondary | ICD-10-CM | POA: Insufficient documentation

## 2022-02-13 DIAGNOSIS — Z79899 Other long term (current) drug therapy: Secondary | ICD-10-CM | POA: Insufficient documentation

## 2022-02-13 DIAGNOSIS — Z452 Encounter for adjustment and management of vascular access device: Secondary | ICD-10-CM

## 2022-02-13 DIAGNOSIS — F32A Depression, unspecified: Secondary | ICD-10-CM | POA: Diagnosis not present

## 2022-02-13 DIAGNOSIS — Z466 Encounter for fitting and adjustment of urinary device: Secondary | ICD-10-CM | POA: Insufficient documentation

## 2022-02-13 DIAGNOSIS — I25119 Atherosclerotic heart disease of native coronary artery with unspecified angina pectoris: Secondary | ICD-10-CM | POA: Diagnosis not present

## 2022-02-13 DIAGNOSIS — F17200 Nicotine dependence, unspecified, uncomplicated: Secondary | ICD-10-CM | POA: Diagnosis not present

## 2022-02-13 DIAGNOSIS — F17210 Nicotine dependence, cigarettes, uncomplicated: Secondary | ICD-10-CM | POA: Diagnosis not present

## 2022-02-13 DIAGNOSIS — I272 Pulmonary hypertension, unspecified: Secondary | ICD-10-CM

## 2022-02-13 DIAGNOSIS — M1990 Unspecified osteoarthritis, unspecified site: Secondary | ICD-10-CM | POA: Diagnosis not present

## 2022-02-13 DIAGNOSIS — I4891 Unspecified atrial fibrillation: Secondary | ICD-10-CM | POA: Diagnosis not present

## 2022-02-13 DIAGNOSIS — K219 Gastro-esophageal reflux disease without esophagitis: Secondary | ICD-10-CM | POA: Insufficient documentation

## 2022-02-13 DIAGNOSIS — Z7901 Long term (current) use of anticoagulants: Secondary | ICD-10-CM | POA: Insufficient documentation

## 2022-02-13 DIAGNOSIS — T83192A Other mechanical complication of urinary stent, initial encounter: Secondary | ICD-10-CM | POA: Diagnosis not present

## 2022-02-13 HISTORY — PX: CYSTOSCOPY W/ URETERAL STENT REMOVAL: SHX1430

## 2022-02-13 SURGERY — REMOVAL, STENT, URETER, CYSTOSCOPIC
Anesthesia: General | Laterality: Right

## 2022-02-13 MED ORDER — ONDANSETRON HCL 4 MG/2ML IJ SOLN
INTRAMUSCULAR | Status: DC | PRN
  Administered 2022-02-13: 4 mg via INTRAVENOUS

## 2022-02-13 MED ORDER — ONDANSETRON HCL 4 MG/2ML IJ SOLN
INTRAMUSCULAR | Status: AC
  Filled 2022-02-13: qty 2

## 2022-02-13 MED ORDER — CHLORHEXIDINE GLUCONATE 0.12 % MT SOLN
15.0000 mL | Freq: Once | OROMUCOSAL | Status: AC
  Administered 2022-02-13: 15 mL via OROMUCOSAL

## 2022-02-13 MED ORDER — DEXAMETHASONE SODIUM PHOSPHATE 10 MG/ML IJ SOLN
INTRAMUSCULAR | Status: AC
  Filled 2022-02-13: qty 1

## 2022-02-13 MED ORDER — SODIUM CHLORIDE 0.9 % IR SOLN
Status: DC | PRN
  Administered 2022-02-13: 1000 mL

## 2022-02-13 MED ORDER — CEFAZOLIN SODIUM-DEXTROSE 2-4 GM/100ML-% IV SOLN
INTRAVENOUS | Status: AC
  Filled 2022-02-13: qty 100

## 2022-02-13 MED ORDER — PROPOFOL 10 MG/ML IV BOLUS
INTRAVENOUS | Status: AC
  Filled 2022-02-13: qty 20

## 2022-02-13 MED ORDER — LIDOCAINE 2% (20 MG/ML) 5 ML SYRINGE
INTRAMUSCULAR | Status: DC | PRN
  Administered 2022-02-13: 80 mg via INTRAVENOUS

## 2022-02-13 MED ORDER — OXYCODONE HCL 5 MG/5ML PO SOLN: 5.0000 mg | Freq: Once | ORAL | Status: DC | PRN

## 2022-02-13 MED ORDER — FENTANYL CITRATE (PF) 100 MCG/2ML IJ SOLN
INTRAMUSCULAR | Status: AC
  Filled 2022-02-13: qty 2

## 2022-02-13 MED ORDER — CEFAZOLIN SODIUM-DEXTROSE 2-4 GM/100ML-% IV SOLN
2.0000 g | INTRAVENOUS | Status: AC
  Administered 2022-02-13: 2 g via INTRAVENOUS

## 2022-02-13 MED ORDER — ONDANSETRON HCL 4 MG/2ML IJ SOLN
INTRAMUSCULAR | Status: AC
Start: 2022-02-13 — End: ?
  Filled 2022-02-13: qty 2

## 2022-02-13 MED ORDER — MIDAZOLAM HCL 2 MG/2ML IJ SOLN
INTRAMUSCULAR | Status: AC
Start: 2022-02-13 — End: ?
  Filled 2022-02-13: qty 2

## 2022-02-13 MED ORDER — LIDOCAINE HCL (PF) 2 % IJ SOLN
INTRAMUSCULAR | Status: AC
  Filled 2022-02-13: qty 5

## 2022-02-13 MED ORDER — FENTANYL CITRATE (PF) 100 MCG/2ML IJ SOLN
INTRAMUSCULAR | Status: DC | PRN
Start: 2022-02-13 — End: 2022-02-13
  Administered 2022-02-13: 50 ug via INTRAVENOUS

## 2022-02-13 MED ORDER — LACTATED RINGERS IV SOLN: INTRAVENOUS | Status: DC

## 2022-02-13 MED ORDER — ORAL CARE MOUTH RINSE: 15.0000 mL | Freq: Once | OROMUCOSAL | Status: AC

## 2022-02-13 MED ORDER — MIDAZOLAM HCL 5 MG/5ML IJ SOLN
INTRAMUSCULAR | Status: DC | PRN
  Administered 2022-02-13: 2 mg via INTRAVENOUS

## 2022-02-13 MED ORDER — OXYCODONE HCL 5 MG PO TABS: 5.0000 mg | ORAL_TABLET | Freq: Once | ORAL | Status: DC | PRN

## 2022-02-13 MED ORDER — FENTANYL CITRATE PF 50 MCG/ML IJ SOSY: 25.0000 ug | PREFILLED_SYRINGE | INTRAMUSCULAR | Status: DC | PRN

## 2022-02-13 MED ORDER — ONDANSETRON HCL 4 MG/2ML IJ SOLN: 4.0000 mg | Freq: Once | INTRAMUSCULAR | Status: DC | PRN

## 2022-02-13 MED ORDER — PROPOFOL 10 MG/ML IV BOLUS
INTRAVENOUS | Status: DC | PRN
  Administered 2022-02-13: 50 mg via INTRAVENOUS

## 2022-02-13 SURGICAL SUPPLY — 11 items
BAG URO CATCHER STRL LF (MISCELLANEOUS) ×1 IMPLANT
CATH URETL OPEN END 6FR 70 (CATHETERS) ×1 IMPLANT
CLOTH BEACON ORANGE TIMEOUT ST (SAFETY) ×2 IMPLANT
GLOVE BIO SURGEON STRL SZ7.5 (GLOVE) ×2 IMPLANT
GOWN STRL REUS W/ TWL XL LVL3 (GOWN DISPOSABLE) ×1 IMPLANT
GOWN STRL REUS W/TWL XL LVL3 (GOWN DISPOSABLE) ×2
GUIDEWIRE STR DUAL SENSOR (WIRE) ×2 IMPLANT
MANIFOLD NEPTUNE II (INSTRUMENTS) ×2 IMPLANT
PACK CYSTO (CUSTOM PROCEDURE TRAY) ×2 IMPLANT
TUBING CONNECTING 10 (TUBING) ×2 IMPLANT
TUBING UROLOGY SET (TUBING) IMPLANT

## 2022-02-13 NOTE — OR Nursing (Signed)
Right urethral stent removed 02/13/2022 1551

## 2022-02-13 NOTE — Progress Notes (Signed)
Went to update patient that his surgery is delayed but he was sound asleep so did not disturb him.  Called the patients sig.other,Vickie, and notified her of patients surgery being delayed.  She voiced understanding.

## 2022-02-13 NOTE — Anesthesia Procedure Notes (Signed)
Procedure Name: General with mask airway Date/Time: 02/13/2022 3:45 PM Performed by: Sharlette Dense, CRNA Patient Re-evaluated:Patient Re-evaluated prior to induction Oxygen Delivery Method: Circle system utilized Preoxygenation: Pre-oxygenation with 100% oxygen Ventilation: Mask ventilation without difficulty and Oral airway inserted - appropriate to patient size Placement Confirmation: positive ETCO2 and breath sounds checked- equal and bilateral Dental Injury: Teeth and Oropharynx as per pre-operative assessment

## 2022-02-13 NOTE — Anesthesia Preprocedure Evaluation (Addendum)
Anesthesia Evaluation  Patient identified by MRN, date of birth, ID band Patient awake    Reviewed: Allergy & Precautions, NPO status , Patient's Chart, lab work & pertinent test results, reviewed documented beta blocker date and time   Airway Mallampati: III  TM Distance: >3 FB Neck ROM: Full    Dental  (+) Missing, Edentulous Upper   Pulmonary Current SmokerPatient did not abstain from smoking.,    Pulmonary exam normal        Cardiovascular hypertension, Pt. on medications + angina + CAD and + Past MI  Normal cardiovascular exam+ dysrhythmias Atrial Fibrillation   Atrial fibrillation slow response , normal EF   Neuro/Psych PSYCHIATRIC DISORDERS Depression Schizophrenia negative neurological ROS     GI/Hepatic Neg liver ROS, GERD  Medicated and Controlled,  Endo/Other  negative endocrine ROSGout Hyperlipidemia  Renal/GU Renal InsufficiencyRenal diseaseIndwelling right ureteral stent Hx/o renal calculi  negative genitourinary   Musculoskeletal  (+) Arthritis , Osteoarthritis,    Abdominal   Peds  Hematology  (+) Blood dyscrasia, anemia , Coumadin therapy   Anesthesia Other Findings   Reproductive/Obstetrics                            Anesthesia Physical  Anesthesia Plan  ASA: 3  Anesthesia Plan: General   Post-op Pain Management:    Induction: Intravenous  PONV Risk Score and Plan: 1 and Ondansetron, Dexamethasone, Treatment may vary due to age or medical condition and Midazolam  Airway Management Planned: LMA  Additional Equipment:   Intra-op Plan:   Post-operative Plan: Extubation in OR  Informed Consent: I have reviewed the patients History and Physical, chart, labs and discussed the procedure including the risks, benefits and alternatives for the proposed anesthesia with the patient or authorized representative who has indicated his/her understanding and acceptance.      Dental advisory given  Plan Discussed with: CRNA  Anesthesia Plan Comments:         Anesthesia Quick Evaluation

## 2022-02-13 NOTE — Transfer of Care (Signed)
Immediate Anesthesia Transfer of Care Note  Patient: Jesse Franklin  Procedure(s) Performed: CYSTOSCOPY WITH RIGHT STENT REMOVAL (Right)  Patient Location: PACU  Anesthesia Type:General  Level of Consciousness: drowsy  Airway & Oxygen Therapy: Patient Spontanous Breathing and Patient connected to face mask oxygen  Post-op Assessment: Report given to RN and Post -op Vital signs reviewed and stable  Post vital signs: Reviewed and stable  Last Vitals:  Vitals Value Taken Time  BP 111/60 02/13/22 1602  Temp    Pulse 46 02/13/22 1603  Resp 17 02/13/22 1603  SpO2 100 % 02/13/22 1603  Vitals shown include unvalidated device data.  Last Pain:  Vitals:   02/13/22 1120  TempSrc:   PainSc: 0-No pain         Complications: No notable events documented.

## 2022-02-13 NOTE — Op Note (Signed)
Operative Note  Preoperative diagnosis:  1.  Retained right ureteral stent  Postoperative diagnosis: 1.  Retained right ureteral stent  Procedure(s): 1.  Cystoscopy with right ureteral stent removal  Surgeon: Link Snuffer, MD  Assistants: None  Anesthesia: General  Complications: None immediate  EBL: Minimal  Specimens: 1.  None  Drains/Catheters: 1.  None  Intraoperative findings: 1.  Normal anterior urethra 2.  Borderline obstructing prostate 3.  Bladder mucosa with some edema from the ureteral stent.  Ureteral stent was removed entirely.  There was still some string attached.  No string was remaining in the bladder.  Indication: 71 year old male status post bilateral retrograde pyelogram with right ureteroscopy with laser lithotripsy and stone extraction followed by stent placement.  Stent was left on a string but the string apparently came off somehow.  He presents for stent removal.  Description of procedure:  The patient was identified and consent was obtained.  The patient was taken to the operating room and placed in the supine position.  The patient was placed under general anesthesia.  Perioperative antibiotics were administered.  The patient was placed in dorsal lithotomy.  Patient was prepped and draped in a standard sterile fashion and a timeout was performed.  A 17 French flexible cystoscope was advanced into the urethra and into the bladder.  The stent was grasped and pulled out.  I reinserted the scope into the bladder with the findings noted above.  There was no string remaining in the bladder.  No other abnormalities.  I withdrew the scope and this concluded the operation.  Patient tolerated the procedure well was stable postoperative.  Plan: He may follow-up in 1 month for renal ultrasound.

## 2022-02-13 NOTE — H&P (Signed)
CC/HPI: CC: Gross hematuria  HPI:  10/14/2021  71 year old male who is on Coumadin presents with a primary complaint of gross hematuria. This occurred a couple of times and was painless in nature. He passed a couple blood clots. This has resolved. He denies any history nephrolithiasis. He is due for a PSA. Poor historian.   11/17/2021  Patient underwent a CT hematuria protocol. This revealed no evidence of genitourinary malignancy. He denies any interval gross hematuria. No significant voiding complaints.   02/07/2022: Patient taken for cystoscopy with bilateral retrograde pyelogram on 5/8 with his urologist. Right retrograde pyelogram revealed a filling defect in the distal right ureter. Left retrograde pyelogram without any filling defect or stone. No bladder abnormality encountered. Right ureteroscopy identified a distal 5 to 6 mm ureteral calculus that was fragmented to smaller fragments and basket extracted. A ureteral stent with tether string was left at the conclusion of the procedure. Patient back today for follow-up exam with imaging.   Today pt has yet to remove his stent, quite evident on today's KUB study. He states the tether string got caught in his underwear and detached in the days following the procedure. He thought that was when his stent was removed. He's been having mild intermittent discomfort and pain with voiding as well as continued intermittent gross hematuria but not severe, manageable by his report. Voiding symptoms outside of this remain grossly stable. No interval/postprocedure fevers or chills, nausea/vomiting.     ALLERGIES: No Allergies    MEDICATIONS: Allopurinol 100 mg tablet  Plavix 75 mg tablet  Tamsulosin Hcl 0.4 mg capsule  Warfarin Sodium 5 mg tablet  Colchicine 0.6 mg tablet  Famotidine 20 mg tablet  Pantoprazole Sodium 40 mg tablet, delayed release  Rosuvastatin Calcium 40 mg tablet     GU PSH: Locm 300-399Mg/Ml Iodine,1Ml - 11/02/2021 Ureteroscopic laser  litho - 01/16/2022       PSH Notes: Hand surgery, heart attack   NON-GU PSH: No Non-GU PSH    GU PMH: BPH w/o LUTS - 11/17/2021, - 10/14/2021 Gross hematuria - 11/17/2021, - 11/02/2021, - 10/14/2021 Encounter for Prostate Cancer screening - 10/14/2021      PMH Notes: kidney failure , liver diease   NON-GU PMH: GERD Gout Heart disease, unspecified Hypercholesterolemia Hypertension Right heart failure, unspecified    FAMILY HISTORY: No Family History    SOCIAL HISTORY: Marital Status: Divorced Preferred Language: English; Ethnicity: Not Hispanic Or Latino; Race: Black or African American Current Smoking Status: Patient smokes.   Tobacco Use Assessment Completed: Used Tobacco in last 30 days? Does not use smokeless tobacco. Has never drank.  Does not use drugs. Drinks 1 caffeinated drink per day.    REVIEW OF SYSTEMS:    GU Review Male:   Patient reports frequent urination. Patient denies hard to postpone urination, burning/ pain with urination, get up at night to urinate, leakage of urine, stream starts and stops, trouble starting your stream, have to strain to urinate , erection problems, and penile pain.  Gastrointestinal (Upper):   Patient denies nausea, vomiting, and indigestion/ heartburn.  Gastrointestinal (Lower):   Patient denies diarrhea and constipation.  Constitutional:   Patient denies fever, night sweats, weight loss, and fatigue.  Skin:   Patient denies skin rash/ lesion and itching.  Eyes:   Patient denies blurred vision and double vision.  Ears/ Nose/ Throat:   Patient denies sore throat and sinus problems.  Hematologic/Lymphatic:   Patient denies swollen glands and easy bruising.  Cardiovascular:   Patient  denies chest pains and leg swelling.  Respiratory:   Patient denies cough and shortness of breath.  Endocrine:   Patient denies excessive thirst.  Musculoskeletal:   Patient denies back pain and joint pain.  Neurological:   Patient denies headaches and dizziness.   Psychologic:   Patient denies depression and anxiety.   Notes: frequency     VITAL SIGNS:      02/07/2022 12:21 PM  BP 137/72 mmHg  Heart Rate 49 /min  Temperature 97.0 F / 36.1 C   GU PHYSICAL EXAMINATION:    Urethral Meatus: Normal size. No lesion, no wart, no discharge, no polyp. Normal location.  Penis: Penis uncircumcised. No foreskin warts, no cracks. No dorsal peyronie's plaques, no left corporal peyronie's plaques, no right corporal peyronie's plaques, no scarring, no shaft warts. No balanitis, no meatal stenosis. Tether string not visualized.   MULTI-SYSTEM PHYSICAL EXAMINATION:    Constitutional: Well-nourished. No physical deformities. Normally developed. Good grooming.  Neck: Neck symmetrical, not swollen. Normal tracheal position.  Respiratory: No labored breathing, no use of accessory muscles.   Cardiovascular: Normal temperature, normal extremity pulses, no swelling, no varicosities.  Skin: No paleness, no jaundice, no cyanosis. No lesion, no ulcer, no rash.  Neurologic / Psychiatric: Oriented to time, oriented to place, oriented to person. No depression, no anxiety, no agitation.  Gastrointestinal: No mass, no tenderness, no rigidity, non obese abdomen.  Musculoskeletal: Normal gait and station of head and neck.     Complexity of Data:  Source Of History:  Patient, Medical Record Summary  Records Review:   Previous Doctor Records, Previous Hospital Records, Previous Patient Records  Urine Test Review:   Urinalysis, Urine Culture  X-Ray Review: KUB: Reviewed Films. Discussed With Patient.  Renal Ultrasound: Reviewed Films. Discussed With Patient.  C.T. Abdomen/Pelvis: Reviewed Films. Reviewed Report.     10/14/21  PSA  Total PSA 1.66 ng/mL    PROCEDURES:         KUB - 74018  A single view of the abdomen is obtained. Right ureteral stent remains in sit to with curl noted in the right kidney and bladder. An obvious opacity consistent with renal or ureteral  calculi is not visualized on today's exam.      Patient confirmed No Neulasta OnPro Device.            Renal Ultrasound - 49179  Right Kidney: Length: 10.4 cm Depth:5.3 cm Cortical Width:1.5 cm Width: 4.8 cm  Left Kidney: Length: 10.2 cm Depth: 4.6 cm Cortical Width:2.1 cm Width:4.7 cm  Left Kidney/Ureter:  ? Hyperechoic area LP= 0.50cm, Septated cystic area MP= 1.2x1.4x1.4cm, ? Complex appearing area UP ( no blood flow noted) = 2.8x3.3x2.4cm  Right Kidney/Ureter:  Multiple cystic areas, largest= 2.8x2.4x2.1cm UP, appears septated.   Bladder:  Not seen.      Patient confirmed No Neulasta OnPro Device.     ASSESSMENT:      ICD-10 Details  1 GU:   Ureteral calculus - N20.1 Right, Acute, Uncomplicated, Resolved  2   Gross hematuria - R31.0 Chronic, Stable  3   Renal cyst - N28.1 Chronic, Stable   PLAN:           Document Letter(s):  Created for Patient: Clinical Summary         Notes:   Renal ultrasound shows previously identified stable bilateral renal cysts. KUB without any evidence of continued ureteral calculi but stent remains in place.  Patient's tether string had become detached and he continues with  indwelling ureteral stent confirmed on KUB today. I recommended office cystoscopy to remove but patient absolutely refused to have this done. He states the only way that this will be removed is with him being asleep utilizing general anesthesia. He understands the risks associated with undergoing another procedure. I will communicate this to his urologist for patient is contacted and scheduled he is here for repeat visit here with Dr Gloriann Loan or getting him set up for cystoscopy with stent removal in the operating room.      Signed by Jiles Crocker, NP on 02/07/22 at 12:36 PM (EDT

## 2022-02-13 NOTE — Anesthesia Postprocedure Evaluation (Signed)
Anesthesia Post Note  Patient: Jesse Franklin  Procedure(s) Performed: CYSTOSCOPY WITH RIGHT STENT REMOVAL (Right)     Patient location during evaluation: PACU Anesthesia Type: General Level of consciousness: awake and alert and oriented Pain management: pain level controlled Vital Signs Assessment: post-procedure vital signs reviewed and stable Respiratory status: spontaneous breathing, nonlabored ventilation and respiratory function stable Cardiovascular status: blood pressure returned to baseline and stable Postop Assessment: no apparent nausea or vomiting Anesthetic complications: no   No notable events documented.  Last Vitals:  Vitals:   02/13/22 1630 02/13/22 1649  BP: 138/77 (!) 141/75  Pulse: (!) 44 (!) 44  Resp: 16 20  Temp:    SpO2: 100% 100%    Last Pain:  Vitals:   02/13/22 1649  TempSrc:   PainSc: 0-No pain                 Sunshine Mackowski A.

## 2022-02-14 ENCOUNTER — Encounter (HOSPITAL_COMMUNITY): Payer: Self-pay | Admitting: Urology

## 2022-02-22 ENCOUNTER — Ambulatory Visit (INDEPENDENT_AMBULATORY_CARE_PROVIDER_SITE_OTHER): Payer: Medicare Other

## 2022-02-22 DIAGNOSIS — Z5181 Encounter for therapeutic drug level monitoring: Secondary | ICD-10-CM

## 2022-02-22 DIAGNOSIS — I4891 Unspecified atrial fibrillation: Secondary | ICD-10-CM

## 2022-02-22 LAB — POCT INR: INR: 2.1 (ref 2.0–3.0)

## 2022-02-22 NOTE — Patient Instructions (Signed)
Description   Continue taking warfarin 1 tablet daily except for 1.5 tablets in [place].   Recheck INR in 5 weeks .  [facility] [phone].

## 2022-02-24 ENCOUNTER — Other Ambulatory Visit: Payer: Self-pay | Admitting: Family Medicine

## 2022-03-06 DIAGNOSIS — H5213 Myopia, bilateral: Secondary | ICD-10-CM | POA: Diagnosis not present

## 2022-03-17 ENCOUNTER — Other Ambulatory Visit: Payer: Self-pay | Admitting: Family Medicine

## 2022-03-21 ENCOUNTER — Other Ambulatory Visit: Payer: Self-pay | Admitting: Cardiovascular Disease

## 2022-03-21 DIAGNOSIS — H5203 Hypermetropia, bilateral: Secondary | ICD-10-CM | POA: Diagnosis not present

## 2022-03-21 DIAGNOSIS — I4891 Unspecified atrial fibrillation: Secondary | ICD-10-CM

## 2022-03-22 DIAGNOSIS — R310 Gross hematuria: Secondary | ICD-10-CM | POA: Diagnosis not present

## 2022-03-22 DIAGNOSIS — N201 Calculus of ureter: Secondary | ICD-10-CM | POA: Diagnosis not present

## 2022-03-27 DIAGNOSIS — Z20822 Contact with and (suspected) exposure to covid-19: Secondary | ICD-10-CM | POA: Diagnosis not present

## 2022-03-29 ENCOUNTER — Ambulatory Visit (INDEPENDENT_AMBULATORY_CARE_PROVIDER_SITE_OTHER): Payer: Medicare Other | Admitting: *Deleted

## 2022-03-29 DIAGNOSIS — I4891 Unspecified atrial fibrillation: Secondary | ICD-10-CM

## 2022-03-29 DIAGNOSIS — Z5181 Encounter for therapeutic drug level monitoring: Secondary | ICD-10-CM

## 2022-03-29 LAB — POCT INR: INR: 2.1 (ref 2.0–3.0)

## 2022-03-29 NOTE — Patient Instructions (Signed)
Description   Continue taking warfarin 1 tablet daily except for 1.5 tablets in [place].   Recheck INR in 6 weeks. [facility] [phone] or [phone]

## 2022-04-13 DIAGNOSIS — M2550 Pain in unspecified joint: Secondary | ICD-10-CM | POA: Diagnosis not present

## 2022-04-13 DIAGNOSIS — Z79899 Other long term (current) drug therapy: Secondary | ICD-10-CM | POA: Diagnosis not present

## 2022-04-13 DIAGNOSIS — M1A09X1 Idiopathic chronic gout, multiple sites, with tophus (tophi): Secondary | ICD-10-CM | POA: Diagnosis not present

## 2022-04-17 ENCOUNTER — Encounter: Payer: Self-pay | Admitting: Family Medicine

## 2022-04-17 ENCOUNTER — Ambulatory Visit (INDEPENDENT_AMBULATORY_CARE_PROVIDER_SITE_OTHER): Payer: Medicare Other | Admitting: Family Medicine

## 2022-04-17 VITALS — BP 124/67 | HR 45 | Ht 65.5 in | Wt 159.6 lb

## 2022-04-17 DIAGNOSIS — M79602 Pain in left arm: Secondary | ICD-10-CM | POA: Diagnosis not present

## 2022-04-17 DIAGNOSIS — K219 Gastro-esophageal reflux disease without esophagitis: Secondary | ICD-10-CM

## 2022-04-17 DIAGNOSIS — I10 Essential (primary) hypertension: Secondary | ICD-10-CM | POA: Diagnosis not present

## 2022-04-17 DIAGNOSIS — E7800 Pure hypercholesterolemia, unspecified: Secondary | ICD-10-CM

## 2022-04-17 DIAGNOSIS — I4811 Longstanding persistent atrial fibrillation: Secondary | ICD-10-CM | POA: Diagnosis not present

## 2022-04-17 DIAGNOSIS — R351 Nocturia: Secondary | ICD-10-CM

## 2022-04-17 DIAGNOSIS — F17200 Nicotine dependence, unspecified, uncomplicated: Secondary | ICD-10-CM | POA: Diagnosis not present

## 2022-04-17 DIAGNOSIS — N401 Enlarged prostate with lower urinary tract symptoms: Secondary | ICD-10-CM

## 2022-04-17 DIAGNOSIS — M109 Gout, unspecified: Secondary | ICD-10-CM | POA: Diagnosis not present

## 2022-04-17 MED ORDER — BACLOFEN 5 MG PO TABS: 2.5000 mg | ORAL_TABLET | Freq: Every day | ORAL | 0 refills | Status: DC | PRN

## 2022-04-17 NOTE — Assessment & Plan Note (Signed)
Suspect radiculopathy from neck, given history and exam.  Suspect on days he works he is developing slight muscle spasm contributing to nerve irritation.  Will do trial of low-dose baclofen and see if this helps.  He will let us know if it is not improving.  Counseled on risk of sedation.

## 2022-04-17 NOTE — Assessment & Plan Note (Signed)
Encouraged smoking cessation.  Discussed option of gum, but patient states he does not have teeth.  Also reviewed availability of lozenges.

## 2022-04-17 NOTE — Assessment & Plan Note (Signed)
Urinating well on Flomax.  Continue with this medication.

## 2022-04-17 NOTE — Progress Notes (Signed)
  Date of Visit: 04/17/2022   SUBJECTIVE:   HPI:  Jesse Franklin presents today for routine follow up, also to discuss L arm tingling.  Hypertension: Currently taking amlodipine 10 mg daily.  Tolerating this well.  GERD: Currently taking Pepcid 20 mg twice a day and Protonix 40 mg daily.  Reports symptoms are well-controlled with this regimen.  Urine: Taking Flomax 0.4 mg daily.  Reports urine stream is adequate on this medication.  Gout: Currently taking colchicine 0.6 mg twice a day and allopurinol 100 mg twice a day.  He follows with rheumatology for this, and reports gout is overall doing well.  Left arm pain: For about a month has had pain in his entire left arm.  Describes the pain as tingling beginning in his shoulder going down his arm.  Also travels to his [place].  Especially notices this after working.  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  Typically last about 2 to 3 days and then will subside.  Does feel like his muscles become more sore after working and this is when he notices the pain.  Has never had this in the past, and denies any changes in his job duties over the last year.  Denies pain in his neck except for when he sleeps sometimes.  Tobacco -smoking about 2 to 3 cigarettes/day.  Thinks he would like to quit.  He also uses marijuana regularly.  OBJECTIVE:   BP 124/67   Pulse (!) 45   Ht 5' 5.5" (1.664 m)   Wt 159 lb 9.6 oz (72.4 kg)   SpO2 99%   BMI 26.15 kg/m  Gen: no acute distress, pleasant, cooperative HEENT: normocephalic, atraumatic  Heart: regular rate and rhythm, no murmur Lungs: clear to auscultation bilaterally, normal work of breathing  Neuro: alert, speech normal, grossly nonfocal Ext: full active ROM of bilateral shoulders. L shoulder 5/5 strength with shoulder abduction and adduction. Grip 5/5 bilaterally. Bilateral elbows 5/5 flexion & extension. Sensation intact over L arm. 2+ radial pulse on L. No muscular atrophy  appreciated. Negative spurlings bilaterally.  ASSESSMENT/PLAN:   Health maintenance:  -declines shingrix -will return for flu shot when available  HYPERCHOLESTEROLEMIA Update lipid panel today.  Continue Crestor 40 mg daily.  HYPERTENSION, BENIGN ESSENTIAL Well controlled. Continue current medication regimen.  Update BMET today.  Left arm pain Suspect radiculopathy from neck, given history and exam.  Suspect on days he works he is developing slight muscle spasm contributing to nerve irritation.  Will do trial of low-dose baclofen and see if this helps.  He will let us know if it is not improving.  Counseled on risk of sedation.  Gout Well-controlled, following with rheumatology.  Continue allopurinol and colchicine.  Atrial fibrillation ([place]) Continues to follow with cardiology for his INR checks.  GASTROESOPHAGEAL REFLUX, NO ESOPHAGITIS Doing well on Pepcid and Protonix.  Continue these medications.  BPH (benign prostatic hyperplasia) Urinating well on Flomax.  Continue with this medication.  TOBACCO DEPENDENCE Encouraged smoking cessation.  Discussed option of gum, but patient states he does not have teeth.  Also reviewed availability of lozenges.  FOLLOW UP: Follow up in 3 months for routine medical problems  [place] J. Ardelia Mems, [place]

## 2022-04-17 NOTE — Assessment & Plan Note (Signed)
Doing well on Pepcid and Protonix.  Continue these medications.

## 2022-04-17 NOTE — Assessment & Plan Note (Signed)
Well-controlled, following with rheumatology.  Continue allopurinol and colchicine.

## 2022-04-17 NOTE — Assessment & Plan Note (Signed)
Continues to follow with cardiology for his INR checks.

## 2022-04-17 NOTE — Patient Instructions (Signed)
It was great to see you again today!  Take baclofen half a pill daily as needed - let's see if this helps your shoulder Let me know if not doing better on this  Checking kidneys and cholesterol today  Follow up with me in 3 months, sooner if needed  Be well, Dr. Ardelia Mems

## 2022-04-17 NOTE — Assessment & Plan Note (Signed)
Update lipid panel today.  Continue Crestor 40 mg daily.

## 2022-04-17 NOTE — Assessment & Plan Note (Signed)
Well controlled. Continue current medication regimen.  Update BMET today.

## 2022-04-18 ENCOUNTER — Encounter: Payer: Self-pay | Admitting: Family Medicine

## 2022-04-18 LAB — LIPID PANEL
Chol/HDL Ratio: 2.7 ratio (ref 0.0–5.0)
Cholesterol, Total: 120 mg/dL (ref 100–199)
HDL: 44 mg/dL (ref >39)
LDL Chol Calc (NIH): 57 mg/dL (ref 0–99)
Triglycerides: 102 mg/dL (ref 0–149)
VLDL Cholesterol Cal: 19 mg/dL (ref 5–40)

## 2022-04-18 LAB — BASIC METABOLIC PANEL
BUN/Creatinine Ratio: 8 — ABNORMAL LOW (ref 10–24)
BUN: 10 mg/dL (ref 8–27)
CO2: 20 mmol/L (ref 20–29)
Calcium: 9.8 mg/dL (ref 8.6–10.2)
Chloride: 107 mmol/L — ABNORMAL HIGH (ref 96–106)
Creatinine, Ser: 1.27 mg/dL (ref 0.76–1.27)
Glucose: 109 mg/dL — ABNORMAL HIGH (ref 70–99)
Potassium: 4.5 mmol/L (ref 3.5–5.2)
Sodium: 142 mmol/L (ref 134–144)
eGFR: 61 mL/min/{1.73_m2} (ref >59)

## 2022-05-09 ENCOUNTER — Other Ambulatory Visit: Payer: Self-pay | Admitting: Family Medicine

## 2022-05-10 ENCOUNTER — Ambulatory Visit: Payer: Medicare Other

## 2022-05-11 ENCOUNTER — Ambulatory Visit: Payer: Medicare Other | Attending: Cardiovascular Disease

## 2022-05-11 DIAGNOSIS — Z5181 Encounter for therapeutic drug level monitoring: Secondary | ICD-10-CM | POA: Diagnosis not present

## 2022-05-11 DIAGNOSIS — I4891 Unspecified atrial fibrillation: Secondary | ICD-10-CM | POA: Diagnosis not present

## 2022-05-11 LAB — POCT INR: INR: 2.6 (ref 2.0–3.0)

## 2022-05-11 NOTE — Patient Instructions (Signed)
Description   Continue taking warfarin 1 tablet daily except for 1.5 tablets in [place].   Recheck INR in 7 weeks. [facility] [phone] or [phone]

## 2022-06-19 ENCOUNTER — Other Ambulatory Visit: Payer: Self-pay | Admitting: Family Medicine

## 2022-06-21 ENCOUNTER — Other Ambulatory Visit: Payer: Self-pay | Admitting: Nurse Practitioner

## 2022-06-21 DIAGNOSIS — K7402 Hepatic fibrosis, advanced fibrosis: Secondary | ICD-10-CM

## 2022-06-21 DIAGNOSIS — K7581 Nonalcoholic steatohepatitis (NASH): Secondary | ICD-10-CM | POA: Diagnosis not present

## 2022-06-27 ENCOUNTER — Other Ambulatory Visit: Payer: Medicare Other

## 2022-06-28 ENCOUNTER — Ambulatory Visit
Admission: RE | Admit: 2022-06-28 | Discharge: 2022-06-28 | Disposition: A | Discharge status: Home / Self Care | Payer: Medicare Other | Source: Ambulatory Visit | Attending: Nurse Practitioner | Admitting: Nurse Practitioner

## 2022-06-28 DIAGNOSIS — K7581 Nonalcoholic steatohepatitis (NASH): Secondary | ICD-10-CM

## 2022-06-28 DIAGNOSIS — K7689 Other specified diseases of liver: Secondary | ICD-10-CM | POA: Diagnosis not present

## 2022-06-28 DIAGNOSIS — K7402 Hepatic fibrosis, advanced fibrosis: Secondary | ICD-10-CM

## 2022-06-29 ENCOUNTER — Ambulatory Visit: Payer: Medicare Other | Attending: Cardiovascular Disease

## 2022-06-29 DIAGNOSIS — I4891 Unspecified atrial fibrillation: Secondary | ICD-10-CM | POA: Diagnosis not present

## 2022-06-29 DIAGNOSIS — Z5181 Encounter for therapeutic drug level monitoring: Secondary | ICD-10-CM | POA: Diagnosis not present

## 2022-06-29 LAB — POCT INR: INR: 2.5 (ref 2.0–3.0)

## 2022-06-29 NOTE — Patient Instructions (Signed)
Continue taking warfarin 1 tablet daily except for 1.5 tablets in [place].   Recheck INR in 8 weeks. [facility] [phone] or [phone]

## 2022-08-07 ENCOUNTER — Encounter: Payer: Self-pay | Admitting: Family Medicine

## 2022-08-07 ENCOUNTER — Ambulatory Visit (INDEPENDENT_AMBULATORY_CARE_PROVIDER_SITE_OTHER): Payer: Medicare Other | Admitting: Family Medicine

## 2022-08-07 VITALS — BP 132/84 | HR 52 | Ht 65.5 in | Wt 167.0 lb

## 2022-08-07 DIAGNOSIS — F209 Schizophrenia, unspecified: Secondary | ICD-10-CM | POA: Diagnosis not present

## 2022-08-07 DIAGNOSIS — S39012A Strain of muscle, fascia and tendon of lower back, initial encounter: Secondary | ICD-10-CM | POA: Diagnosis not present

## 2022-08-07 DIAGNOSIS — M1A09X0 Idiopathic chronic gout, multiple sites, without tophus (tophi): Secondary | ICD-10-CM | POA: Diagnosis not present

## 2022-08-07 DIAGNOSIS — J069 Acute upper respiratory infection, unspecified: Secondary | ICD-10-CM | POA: Diagnosis not present

## 2022-08-07 MED ORDER — BACLOFEN 5 MG PO TABS: 2.5000 mg | ORAL_TABLET | Freq: Every day | ORAL | 0 refills | Status: DC | PRN

## 2022-08-07 NOTE — Assessment & Plan Note (Signed)
Stable on current medications.  Continue allopurinol 100 mg BID and colchicine 0.6 mg BID.  Discussed importance of avoiding alcohol for prevention of gout flares.

## 2022-08-07 NOTE — Progress Notes (Unsigned)
SUBJECTIVE:   CHIEF COMPLAINT / HPI:  Jesse Franklin is a 71 y.o. male with a past medical history of CAD, afib on warfarin, gout, BPH, tobacco use, cannabis use, schizophrenia, and GERD presenting to the clinic for routine checkup and lower back pain.  Lower back pain Patient reports that he has had chronic low back pain for several years since 2015 or 16.  Pain does not appear to be worsening and has a dull quality, occurring intermittently and then briefly relenting. Patient indicates that pain occurs in the left lower back.  He is wearing a back binder and states that it brings him some relief.  He has not tried any over-the-counter pain relief medicines and he does not use any heating pads.  Patient had a baclofen prescription ordered on 8/7 for his cervical radiculopathy symptoms, but does not recall if he took it or not.  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 Denies having associated fevers, saddle anesthesia, lower extremity weakness, or problems with stooling or urination.   URI Patient states that he has had runny nose and cough since Saturday 11/25.  He notes that he had significant chills and sweats around the onset of symptoms but has not measured his temperature at home.  Denies shortness of breath and nausea/vomiting/diarrhea.  He has not taken any medications at home for symptomatic management.  Has not done a COVID-19 swab.  Gout Patient reports that he has not had an acute gout flare since 2016.  He takes allopurinol 100 mg twice daily and colchicine 0.6 mg twice daily.  Denies any joint swelling and acute joint pain.  He states that he drinks 1-2 [employer] black cherry hard lemonades per week but does not otherwise drink alcohol.  He is seeing his "gout doctor" in December.  Tobacco use Patient currently smokes 4-5 cigarettes per day.  He has previously tried nicotine patch and lozenges without success.   He has never tried Chantix but does have history of schizophrenia with hallucinations.  He initially expresses some desire to reduce smoking but later states that his smoking helps him cope with life and is interested in quitting at this time.  Schizophrenia Patient reportedly was diagnosed with schizophrenia many years ago when he was using illicit substances including cocaine.  Currently, patient endorses seeing some shadows without him and all movements in the corner of his eye.  She notes that he hears voices telling him bad things sometimes.  He states that he sometimes has thoughts of hurting himself but is able to push back against these thoughts and any voices telling him to do so.  He occasionally comes sad and tearful and that precipitates his thoughts.  Endorses one suicide attempt in 1999 by intentional prescription medication overdose.  Does not have a plan for suicide and does not have suicidal ideation at the moment.  Patient states that he drinks 1-2 Mike[employer] black cherry hard lemonades per week and this helps him when he feels down.  He also smokes a joint or some cigarettes when he feels depressed.   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  Enjoys the job a lot. Lives alone with his dog nicknamed "Knucklehead." Smokes 4-5 cigarettes per day.  Smokes a few blunts of marijuana per week. Drinks 1-2 [employer] black cherry hard lemonades per week.  OBJECTIVE:   BP 132/84   Pulse (!) 52   Ht 5' 5.5" (1.664 m)   Wt 167 lb (75.8 kg)   SpO2 98%   BMI 27.37 kg/m   General: Age-appropriate, resting comfortably in chair, no acute distress, alert and at baseline. HEENT:  Head: Normocephalic, atraumatic. Eyes: Sclera without injection or icterus. Ears: TMs non-bulging and non-erythematous bilaterally. No erythema of external ear canal. No cerumen impaction. Uses hearing aids. Nose: Mildly erythematous  turbinates. Mouth/Oral: Clear, no tonsillar exudate. MMM. Neck: Supple. No LAD, thyroid smooth and not palpable. Cardiovascular: Regular rate and rhythm. Normal S1/S2. No murmurs, rubs, or gallops appreciated. 2+ radial pulses. Pulmonary: Clear bilaterally to ascultation. No increased WOB, no accessory muscle usage. No wheezes, rales, or crackles. Extremities: No peripheral edema bilaterally. MSK: Significant left paraspinal tenderness in lumbar region.  Strength and sensation of lower extremities equal bilaterally and WNL.   No CVA tenderness.  No point tenderness over spine.   Psych: Full range affect, no blunting.  Normal mood.  Pleasant and appropriate. Does not appear to be actively responding to internal stimuli   ASSESSMENT/PLAN:   Schizophrenia ([place]) Diagnosis self-reported by patient from many years ago.  Patient does not endorse active suicidal ideation or a plan at this time.  PHQ-9 today shows a score of 9 with a response of 0 for question #9.  Notably, past suicide attempt history is concerning, but remote and current HPI is reassuring.  He does a good job of challenging voices and internal monologue.  Patient has not taken medication in many years and is adamant that he is not interested in therapy at this time.  Appears to experience moderate auditory and rare visual hallucinations, but is able to function without significant impairment.  Agrees to call suicide hotline or go to ED if he feels like he may hurt himself. - Counseled on importance of seeking help if in crisis and provided suicide hotline  Gout Stable on current medications.  Continue allopurinol 100 mg BID and colchicine 0.6 mg BID.  Discussed importance of avoiding alcohol for prevention of gout flares.  Lumbosacral strain Pain is consistent with lumbosacral muscle strain based on presentation and physical exam findings.  Not concerning for malignancy given paraspinal location.  No red flag symptoms such as nighttime  awakenings due to pain, fevers, saddle paraesthesias, or LE weakness/numbness.  Avoiding NSAIDs given patient's past medical history of CAD. - Try heating pad for symptomatic relief - Provided lower back strain PT exercises to do at home - Baclofen 2.5 mg PRN and follow-up if not improving after a few weeks for possible further imaging or PT referral  URI Patient is hemodynamically stable and in no acute distress.  Likely viral URI based on presentation and physical exam.  No concern for pneumonia or bronchitis given unremarkable lung exam. - Obtained COVID-19 PCR swab, instructed patient to mask in public pending results - Deferred flu vaccination in setting of possible recent fevers, patient will return for nurse visit to get flu vaccine in a few weeks  Dimitry Mining engineer, [place]   Patient seen along with medical student Dimitry Shitarev. I personally evaluated this patient along with the student, and verified all aspects of the history, physical exam, and medical decision making as documented by the student. I agree with the student's documentation and have made all necessary edits.  Chrisandra Netters, MD  [place]

## 2022-08-07 NOTE — Assessment & Plan Note (Signed)
Pain is consistent with lumbosacral muscle strain based on presentation and physical exam findings.  Not concerning for malignancy given paraspinal location.  No red flag symptoms such as nighttime awakenings due to pain, fevers, saddle paraesthesias, or LE weakness/numbness.  Avoiding NSAIDs given patient's past medical history of CAD. - Try heating pad for symptomatic relief - Provided lower back strain PT exercises to do at home - Baclofen 2.5 mg PRN and follow-up if not improving after a few weeks for possible further imaging or PT referral

## 2022-08-07 NOTE — Patient Instructions (Addendum)
It was great to see you again today!  Testing you for COVID Please continue wearing a mask any time you are around others  Sent in muscle relaxer for you to try for your back Can also try heating pad See exercises below  Be well, Dr. Ardelia Mems   Low Back Sprain or Strain Rehab Ask your health care provider which exercises are safe for you. Do exercises exactly as told by your health care provider and adjust them as directed. It is normal to feel mild stretching, pulling, tightness, or discomfort as you do these exercises. Stop right away if you feel sudden pain or your pain gets worse. Do not begin these exercises until told by your health care provider. Stretching and range-of-motion exercises These exercises warm up your muscles and joints and improve the movement and flexibility of your back. These exercises also help to relieve pain, numbness, and tingling. Lumbar rotation  Lie on your back on a firm bed or the floor with your knees bent. Straighten your arms out to your sides so each arm forms a 90-degree angle (right angle) with a side of your body. Slowly move (rotate) both of your knees to one side of your body until you feel a stretch in your lower back (lumbar). Try not to let your shoulders lift off the floor. Hold this position for __________ seconds. Tense your abdominal muscles and slowly move your knees back to the starting position. Repeat this exercise on the other side of your body. Repeat __________ times. Complete this exercise __________ times a day. Single knee to chest  Lie on your back on a firm bed or the floor with both legs straight. Bend one of your knees. Use your hands to move your knee up toward your chest until you feel a gentle stretch in your lower back and buttock. Hold your leg in this position by holding on to the front of your knee. Keep your other leg as straight as possible. Hold this position for __________ seconds. Slowly return to the  starting position. Repeat with your other leg. Repeat __________ times. Complete this exercise __________ times a day. Prone extension on elbows  Lie on your abdomen on a firm bed or the floor (prone position). Prop yourself up on your elbows. Use your arms to help lift your chest up until you feel a gentle stretch in your abdomen and your lower back. This will place some of your body weight on your elbows. If this is uncomfortable, try stacking pillows under your chest. Your hips should stay down, against the surface that you are lying on. Keep your hip and back muscles relaxed. Hold this position for __________ seconds. Slowly relax your upper body and return to the starting position. Repeat __________ times. Complete this exercise __________ times a day. Strengthening exercises These exercises build strength and endurance in your back. Endurance is the ability to use your muscles for a long time, even after they get tired. Pelvic tilt This exercise strengthens the muscles that lie deep in the abdomen. Lie on your back on a firm bed or the floor with your legs extended. Bend your knees so they are pointing toward the ceiling and your feet are flat on the floor. Tighten your lower abdominal muscles to press your lower back against the floor. This motion will tilt your pelvis so your tailbone points up toward the ceiling instead of pointing to your feet or the floor. To help with this exercise, you may place a  small towel under your lower back and try to push your back into the towel. Hold this position for __________ seconds. Let your muscles relax completely before you repeat this exercise. Repeat __________ times. Complete this exercise __________ times a day. Alternating arm and leg raises  Get on your hands and knees on a firm surface. If you are on a hard floor, you may want to use padding, such as an exercise mat, to cushion your knees. Line up your arms and legs. Your hands should  be directly below your shoulders, and your knees should be directly below your hips. Lift your left leg behind you. At the same time, raise your right arm and straighten it in front of you. Do not lift your leg higher than your hip. Do not lift your arm higher than your shoulder. Keep your abdominal and back muscles tight. Keep your hips facing the ground. Do not arch your back. Keep your balance carefully, and do not hold your breath. Hold this position for __________ seconds. Slowly return to the starting position. Repeat with your right leg and your left arm. Repeat __________ times. Complete this exercise __________ times a day. Abdominal set with straight leg raise  Lie on your back on a firm bed or the floor. Bend one of your knees and keep your other leg straight. Tense your abdominal muscles and lift your straight leg up, 4-6 inches (10-15 cm) off the ground. Keep your abdominal muscles tight and hold this position for __________ seconds. Do not hold your breath. Do not arch your back. Keep it flat against the ground. Keep your abdominal muscles tense as you slowly lower your leg back to the starting position. Repeat with your other leg. Repeat __________ times. Complete this exercise __________ times a day. Single leg lower with bent knees Lie on your back on a firm bed or the floor. Tense your abdominal muscles and lift your feet off the floor, one foot at a time, so your knees and hips are bent in 90-degree angles (right angles). Your knees should be over your hips and your lower legs should be parallel to the floor. Keeping your abdominal muscles tense and your knee bent, slowly lower one of your legs so your toe touches the ground. Lift your leg back up to return to the starting position. Do not hold your breath. Do not let your back arch. Keep your back flat against the ground. Repeat with your other leg. Repeat __________ times. Complete this exercise __________ times a  day. Posture and body mechanics Good posture and healthy body mechanics can help to relieve stress in your body's tissues and joints. Body mechanics refers to the movements and positions of your body while you do your daily activities. Posture is part of body mechanics. Good posture means: Your spine is in its natural S-curve position (neutral). Your shoulders are pulled back slightly. Your head is not tipped forward (neutral). Follow these guidelines to improve your posture and body mechanics in your everyday activities. Standing  When standing, keep your spine neutral and your feet about hip-width apart. Keep a slight bend in your knees. Your ears, shoulders, and hips should line up. When you do a task in which you stand in one place for a long time, place one foot up on a stable object that is 2-4 inches (5-10 cm) high, such as a footstool. This helps keep your spine neutral. Sitting  When sitting, keep your spine neutral and keep your feet flat  on the floor. Use a footrest, if necessary, and keep your thighs parallel to the floor. Avoid rounding your shoulders, and avoid tilting your head forward. When working at a desk or a computer, keep your desk at a height where your hands are slightly lower than your elbows. Slide your chair under your desk so you are close enough to maintain good posture. When working at a computer, place your monitor at a height where you are looking straight ahead and you do not have to tilt your head forward or downward to look at the screen. Resting When lying down and resting, avoid positions that are most painful for you. If you have pain with activities such as sitting, bending, stooping, or squatting, lie in a position in which your body does not bend very much. For example, avoid curling up on your side with your arms and knees near your chest (fetal position). If you have pain with activities such as standing for a long time or reaching with your arms, lie  with your spine in a neutral position and bend your knees slightly. Try the following positions: Lying on your side with a pillow between your knees. Lying on your back with a pillow under your knees. Lifting  When lifting objects, keep your feet at least shoulder-width apart and tighten your abdominal muscles. Bend your knees and hips and keep your spine neutral. It is important to lift using the strength of your legs, not your back. Do not lock your knees straight out. Always ask for help to lift heavy or awkward objects. This information is not intended to replace advice given to you by your health care provider. Make sure you discuss any questions you have with your health care provider. Document Revised: 11/15/2020 Document Reviewed: 11/15/2020 Elsevier Patient Education  Jesse Franklin.

## 2022-08-07 NOTE — Assessment & Plan Note (Addendum)
Diagnosis self-reported by patient from many years ago.  Patient does not endorse active suicidal ideation or a plan at this time.  PHQ-9 today shows a score of 9 with a response of 0 for question #9.  Notably, past suicide attempt history is concerning, but remote and current HPI is reassuring.  He does a good job of challenging voices and internal monologue.  Patient has not taken medication in many years and is adamant that he is not interested in therapy at this time.  Appears to experience moderate auditory and rare visual hallucinations, but is able to function without significant impairment.  Agrees to call suicide hotline or go to ED if he feels like he may hurt himself. - Counseled on importance of seeking help if in crisis and provided suicide hotline

## 2022-08-09 ENCOUNTER — Ambulatory Visit: Payer: Medicare Other

## 2022-08-09 LAB — NOVEL CORONAVIRUS, NAA: SARS-CoV-2, NAA: NOT DETECTED

## 2022-08-23 ENCOUNTER — Ambulatory Visit: Payer: Medicare Other | Attending: Cardiovascular Disease

## 2022-08-23 DIAGNOSIS — Z5181 Encounter for therapeutic drug level monitoring: Secondary | ICD-10-CM

## 2022-08-23 DIAGNOSIS — I4891 Unspecified atrial fibrillation: Secondary | ICD-10-CM | POA: Diagnosis not present

## 2022-08-23 LAB — POCT INR: INR: 1.6 — AB (ref 2.0–3.0)

## 2022-08-23 NOTE — Patient Instructions (Signed)
Description   Take 2 tablets today and then continue taking warfarin 1 tablet daily except for 1.5 tablets in [place].   Recheck INR in 4 weeks.  [facility] [phone]

## 2022-08-26 ENCOUNTER — Other Ambulatory Visit: Payer: Self-pay | Admitting: Family Medicine

## 2022-09-08 ENCOUNTER — Ambulatory Visit (INDEPENDENT_AMBULATORY_CARE_PROVIDER_SITE_OTHER): Payer: Medicare Other

## 2022-09-08 VITALS — Ht 65.5 in | Wt 167.0 lb

## 2022-09-08 DIAGNOSIS — Z0000 Encounter for general adult medical examination without abnormal findings: Secondary | ICD-10-CM | POA: Diagnosis not present

## 2022-09-08 DIAGNOSIS — Z87891 Personal history of nicotine dependence: Secondary | ICD-10-CM

## 2022-09-08 NOTE — Progress Notes (Signed)
I connected with  Jesse Franklin on 09/08/22 by a audio enabled telemedicine application and verified that I am speaking with the correct person using two identifiers.  Patient Location: Home  Provider Location: Home Office  I discussed the limitations of evaluation and management by telemedicine. The patient expressed understanding and agreed to proceed. Subjective:   Jesse Franklin is a 71 y.o. male who presents for Medicare Annual/Subsequent preventive examination.  Review of Systems    Defer to provider       Objective:    Today's Vitals   09/08/22 1332  Weight: 167 lb (75.8 kg)  Height: 5' 5.5" (1.664 m)   Body mass index is 27.37 kg/m.     09/08/2022    1:39 PM 08/07/2022   10:31 AM 02/10/2022    9:38 AM 01/16/2022    6:03 AM 12/30/2021   11:41 AM 12/19/2021   11:10 AM 11/15/2021    3:06 PM  Advanced Directives  Does Patient Have a Medical Advance Directive? No No No No No No No  Would patient like information on creating a medical advance directive? No - Patient declined No - Patient declined  No - Patient declined       Current Medications (verified) Outpatient Encounter Medications as of 09/08/2022  Medication Sig   allopurinol (ZYLOPRIM) 100 MG tablet Take 100 mg by mouth 2 (two) times daily.   amLODipine (NORVASC) 10 MG tablet Take 1 tablet (10 mg total) by mouth daily.   Baclofen 5 MG TABS Take 2.5 mg by mouth daily as needed.   clopidogrel (PLAVIX) 75 MG tablet TAKE 1 TABLET BY MOUTH EVERY DAY (Patient taking differently: Take 75 mg by mouth daily.)   colchicine 0.6 MG tablet TAKE 1 TABLET BY MOUTH 2 TIMES DAILY.   famotidine (PEPCID) 20 MG tablet TAKE 1 TABLET BY MOUTH TWICE A DAY   nitroGLYCERIN (NITROSTAT) 0.4 MG SL tablet Place 1 tablet (0.4 mg total) under the tongue as directed. Place 1 tab under tongue as directed   pantoprazole (PROTONIX) 40 MG tablet TAKE 1 TABLET BY MOUTH EVERY DAY   rosuvastatin (CRESTOR) 40 MG tablet TAKE 1 TABLET BY MOUTH EVERY  DAY   tamsulosin (FLOMAX) 0.4 MG CAPS capsule TAKE 1 CAPSULE BY MOUTH EVERY DAY   warfarin (COUMADIN) 5 MG tablet Take 1 to 1 and 1/2 tablets by mouth once daily or as directed by Coumadin Clinic.   Facility-Administered Encounter Medications as of 09/08/2022  Medication   0.9 %  sodium chloride infusion    Allergies (verified) Codeine and Ketorolac tromethamine   History: Past Medical History:  Diagnosis Date   2015    Alcohol abuse    Arthritis    Atrial fibrillation (HCC)    BPH 11/08/2006   CAD (coronary artery disease)    Branch Vessel   Chronic kidney disease    Cocaine abuse ([place])    Hx of   Depression    GERD (gastroesophageal reflux disease)    Gout    Hallucination    "smoke marijuana to prevent hallucinations", started in 1999, last one 11/2021   Herpes    Herpes genitalia 04/13/2011   History of kidney stones    Hyperlipidemia    Hypertension    Myocardial infarction ([place]) 2005   RHINITIS, ALLERGIC 11/08/2006   Tobacco abuse    Past Surgical History:  Procedure Laterality Date   CARDIAC CATHETERIZATION  08/11/2004   CARPAL TUNNEL RELEASE Right 04/04/2017   Procedure: RIGHT CARPAL  TUNNEL RELEASE;  Surgeon: Leandrew Koyanagi, MD;  Location: [facility];  Service: Orthopedics;  Laterality: Right;   CYSTOSCOPY W/ RETROGRADES Bilateral 01/16/2022   Procedure: CYSTOSCOPY WITH RETROGRADE PYELOGRAM;  Surgeon: Lucas Mallow, MD;  Location: [facility];  Service: Urology;  Laterality: Bilateral;   CYSTOSCOPY W/ URETERAL STENT REMOVAL Right 02/13/2022   Procedure: CYSTOSCOPY WITH RIGHT STENT REMOVAL;  Surgeon: Lucas Mallow, MD;  Location: [facility];  Service: Urology;  Laterality: Right;  30 MINS FOR THIS CASE   LACERATION REPAIR Right 03/11/1988   "hand"   URETEROSCOPY Right 01/16/2022   Procedure: URETEROSCOPY WITH LASER LITHOTRIPSY STONE BASKET EXTRACTION, STENT PLACEMENT;  Surgeon: Lucas Mallow, MD;  Location: [facility];  Service: Urology;   Laterality: Right;   Family History  Problem Relation Age of Onset   Alcohol abuse Father    Heart disease Brother    Alcohol abuse Brother    Cancer Sister    Hypertension Mother    Gout Mother    Arthritis Brother    Hypertension Sister    Social History   Socioeconomic History   Marital status: Single    Spouse name: Not on file   Number of children: 0   Years of education: 8   Highest education level: 8th grade  Occupational History   Occupation: retired- Editor, commissioning: UNEMPLOYED   Occupation: motel work  Tobacco Use   Smoking status: Every Day    Packs/day: 0.25    Years: 48.00    Total pack years: 12.00    Types: Cigarettes   Smokeless tobacco: Former    Types: Chew    Quit date: 09/12/1967   Tobacco comments:    smoking 3-4 cigs/day  Vaping Use   Vaping Use: Never used  Substance and Sexual Activity   Alcohol use: Not Currently    Comment: last use per pt- 10 years ago   Drug use: Yes    Frequency: 3.0 times per week    Types: Marijuana    Comment: hx of cocaine use- 2005, marijuana- daily   Sexual activity: Yes    Birth control/protection: Condom  Other Topics Concern   Not on file  Social History Narrative   Pt is illiterate, he needs assistance with medications and any instructions given   Who lives with you: self and dog, mini doberman named Grandma   Lives in apartment on ground floor. Apt has handrail on stair. Has smoke alarm in apartment. No throw rugs. No grab bars in bathroom.   Diet: Pt has a varied diet. Avoids red meat, due to gout. Eats chicken, veggies, fruit.    Exercise: Pt reports walking 20/30 minutes almost every day walking dog.   Seatbelts: Pt reports wearing seatbelt when in vehicles.    Hobbies: walking, watching tv, go to park with dog. Avoids crowds.    Smokes marijuana "keeps me from hearing voices". No alcohol. Daily cigarette smoker. Does not want to quit.   Sexually active, condoms given for STI  protection.         Social Determinants of Health   Financial Resource Strain: Low Risk  (09/08/2022)   Overall Financial Resource Strain (CARDIA)    Difficulty of Paying Living Expenses: Not hard at all  Food Insecurity: No Food Insecurity (09/08/2022)   Hunger Vital Sign    Worried About Running Out of Food in the Last Year: Never true    Ran Out of  Food in the Last Year: Never true  Transportation Needs: No Transportation Needs (09/08/2022)   PRAPARE - Hydrologist (Medical): No    Lack of Transportation (Non-Medical): No  Physical Activity: Sufficiently Active (09/08/2022)   Exercise Vital Sign    Days of Exercise per Week: 5 days    Minutes of Exercise per Session: 30 min  Stress: No Stress Concern Present (09/08/2022)   [place]    Feeling of Stress : Not at all  Social Connections: Socially Isolated (09/08/2022)   Social Connection and Isolation Panel [NHANES]    Frequency of Communication with Friends and Family: Once a week    Frequency of Social Gatherings with Friends and Family: Once a week    Attends Religious Services: Never    Marine scientist or Organizations: No    Attends Music therapist: Never    Marital Status: Divorced    Tobacco Counseling Ready to quit: Not Answered Counseling given: Not Answered Tobacco comments: smoking 3-4 cigs/day   Clinical Intake:  Pre-visit preparation completed: YesHow often do you need to have someone help you when you read instructions, pamphlets, or other written materials from your doctor or pharmacy?: 1 - Never What is the last grade level you completed in school?: 12th grade  Diabetic?no  Interpreter Needed?: No Activities of Daily Living    09/08/2022    1:41 PM 02/10/2022    9:40 AM  In your present state of health, do you have any difficulty performing the following activities:  Hearing? 1    Vision? 0   Difficulty concentrating or making decisions? 0   Walking or climbing stairs? 0   Dressing or bathing? 0   Doing errands, shopping? 0 0  Preparing Food and eating ? N   Using the Toilet? N   In the past six months, have you accidently leaked urine? N   Do you have problems with loss of bowel control? N   Managing your Medications? N   Managing your Finances? N   Housekeeping or managing your Housekeeping? N     Patient Care Team: Leeanne Rio, MD as PCP - General (Pediatrics) Nahser, Wonda Cheng, MD as PCP - Cardiology (Cardiology)  Indicate any recent Medical Services you may have received from other than [facility] providers in the past year (date may be approximate).     Assessment:   This is a routine wellness examination for Hezakiah.  Hearing/Vision screen Hearing Screening - Comments:: Wears hearing aides Vision Screening - Comments:: Wears reading glasses/last exam in June 2023/[facility]  Dietary issues and exercise activities discussed: Current Exercise Habits: The patient has a physically strenuous job, but has no regular exercise apart from work.   Goals Addressed             This Visit's Progress    DIET - INCREASE WATER INTAKE         Depression Screen    09/08/2022    1:38 PM 08/07/2022   10:31 AM 04/17/2022   10:43 AM 11/15/2021    3:06 PM 09/14/2021    9:52 AM 07/14/2021   11:30 AM 04/19/2021   11:26 AM  PHQ 2/9 Scores  PHQ - 2 Score 2 3 2  0 3 2 0  PHQ- 9 Score  9 6 0 9 4     Fall Risk    09/08/2022    1:34 PM 08/07/2022   10:31  AM 04/17/2022   10:43 AM 04/19/2021   11:25 AM 02/01/2021   10:08 AM  Fall Risk   Falls in the past year? 0 0 0 0 0  Number falls in past yr:  0  0 0  Injury with Fall?  0  0 0  Follow up  Falls evaluation completed       FALL RISK PREVENTION PERTAINING TO THE HOME:  Any stairs in or around the home? Yes  If so, are there any without handrails? Yes  Home free of loose throw rugs in walkways, pet beds,  electrical cords, etc? Yes  Adequate lighting in your home to reduce risk of falls? Yes   ASSISTIVE DEVICES UTILIZED TO PREVENT FALLS:  Life alert? No  Use of a cane, walker or w/c? No  Grab bars in the bathroom? No  Shower chair or bench in shower? No  Elevated toilet seat or a handicapped toilet? No   Cognitive Function:    07/17/2018   10:26 AM 01/20/2014   10:00 AM 01/16/2013   10:00 AM  MMSE - Mini Mental State Exam  Orientation to time 5 5 5   Orientation to Place 5 5 5   Registration 3 3 3   Attention/ Calculation 5 0 0  Recall 3 2 3   Language- name 2 objects 2 2 2   Language- repeat 1 1 1   Language- follow 3 step command 3 3 3   Language- read & follow direction 1 1 0  Write a sentence 1 1 0  Copy design 1 1 0  Total score 30 24 22         07/17/2018   10:26 AM  6CIT Screen  What Year? 0 points  What month? 0 points  What time? 0 points  Count back from 20 0 points  Months in reverse 0 points  Repeat phrase 0 points  Total Score 0 points    Immunizations Immunization History  Administered Date(s) Administered   Influenza, High Dose Seasonal PF 06/13/2021   Influenza,inj,Quad PF,6+ Mos 07/08/2014   Influenza-Unspecified 08/31/2022   PFIZER Comirnaty(Gray Top)Covid-19 Tri-Sucrose Vaccine 11/04/2020   PFIZER(Purple Top)SARS-COV-2 Vaccination 05/13/2020, 06/03/2020   PNEUMOCOCCAL CONJUGATE-20 02/01/2021   Pfizer Covid-19 Vaccine Bivalent Booster 44yr & up 07/14/2021   Td 09/11/1997, 06/04/2008   Td (Adult),unspecified 09/11/1997, 06/04/2008   Tdap 08/13/2020    TDAP status: Up to date  Flu Vaccine status: Up to date  Pneumococcal vaccine status: Up to date  Covid-19 vaccine status: Information provided on how to obtain vaccines.   Qualifies for Shingles Vaccine? Yes   Zostavax completed No   Shingrix Completed?: No.    Education has been provided regarding the importance of this vaccine. Patient has been advised to call insurance company to determine  out of pocket expense if they have not yet received this vaccine. Advised may also receive vaccine at local pharmacy or Health Dept. Verbalized acceptance and understanding.  Screening Tests Health Maintenance  Topic Date Due   Medicare Annual Wellness (AWV)  07/18/2019   COVID-19 Vaccine (5 - 2023-24 season) 05/12/2022   Zoster Vaccines- Shingrix (1 of 2) 12/08/2022 (Originally 04/04/1970)   C[place]ellFOBT  12/02/2022   COLONOSCOPY (Pts 45-459yrInsurance coverage will need to be confirmed)  11/14/2023   DTaP/Tdap/Td (6 - Td or Tdap) 08/13/2030   Pneumonia Vaccine 6571Years old  Completed   INFLUENZA VACCINE  Completed   Hepatitis C Screening  Completed   HPV VACCINES  Aged Out  Health Maintenance  Health Maintenance Due  Topic Date Due   Medicare Annual Wellness (AWV)  07/18/2019   COVID-19 Vaccine (5 - 2023-24 season) 05/12/2022    Colorectal cancer screening: Type of screening: Colonoscopy. Completed 11/13/2013. Repeat every 10 years  Lung Cancer Screening: (Low Dose CT Chest recommended if Age 53-80 years, 30 pack-year currently smoking OR have quit w/in 15years.) does qualify.   Lung Cancer Screening Referral: placed  Additional Screening:  Hepatitis C Screening: does not qualify; Completed 11/04/2014  Vision Screening: Recommended annual ophthalmology exams for early detection of glaucoma and other disorders of the eye. Is the patient up to date with their annual eye exam?  Yes  Who is the provider or what is the name of the office in which the patient attends annual eye exams? [facility] If pt is not established with a provider, would they like to be referred to a provider to establish care? No .   Dental Screening: Recommended annual dental exams for proper oral hygiene  Community Resource Referral / Chronic Care Management: CRR required this visit?  No   CCM required this visit?  No      Plan:     I have personally reviewed and noted the  following in the patient's chart:   Medical and social history Use of alcohol, tobacco or illicit drugs  Current medications and supplements including opioid prescriptions. Patient is not currently taking opioid prescriptions. Functional ability and status Nutritional status Physical activity Advanced directives List of other physicians Hospitalizations, surgeries, and ER visits in previous 12 months Vitals Screenings to include cognitive, depression, and falls Referrals and appointments  In addition, I have reviewed and discussed with patient certain preventive protocols, quality metrics, and best practice recommendations. A written personalized care plan for preventive services as well as general preventive health recommendations were provided to patient.     Amado Coe, [place]   09/08/2022

## 2022-09-17 ENCOUNTER — Other Ambulatory Visit: Payer: Self-pay | Admitting: Cardiovascular Disease

## 2022-09-17 ENCOUNTER — Other Ambulatory Visit: Payer: Self-pay | Admitting: Family Medicine

## 2022-09-17 DIAGNOSIS — I4891 Unspecified atrial fibrillation: Secondary | ICD-10-CM

## 2022-09-18 ENCOUNTER — Other Ambulatory Visit: Payer: Self-pay | Admitting: *Deleted

## 2022-09-18 DIAGNOSIS — I4891 Unspecified atrial fibrillation: Secondary | ICD-10-CM

## 2022-09-20 ENCOUNTER — Ambulatory Visit: Payer: Medicare HMO | Attending: Cardiology | Admitting: *Deleted

## 2022-09-20 DIAGNOSIS — I4891 Unspecified atrial fibrillation: Secondary | ICD-10-CM

## 2022-09-20 DIAGNOSIS — Z5181 Encounter for therapeutic drug level monitoring: Secondary | ICD-10-CM | POA: Diagnosis not present

## 2022-09-20 LAB — POCT INR: INR: 2.3 (ref 2.0–3.0)

## 2022-09-20 NOTE — Patient Instructions (Signed)
Description   Continue taking warfarin 1 tablet daily except for 1.5 tablets in [place].   Recheck INR in 5 weeks. [facility] [phone]

## 2022-10-25 ENCOUNTER — Ambulatory Visit: Payer: 59 | Attending: Cardiovascular Disease

## 2022-10-25 DIAGNOSIS — I4891 Unspecified atrial fibrillation: Secondary | ICD-10-CM | POA: Diagnosis not present

## 2022-10-25 DIAGNOSIS — Z5181 Encounter for therapeutic drug level monitoring: Secondary | ICD-10-CM | POA: Diagnosis not present

## 2022-10-25 LAB — POCT INR: INR: 2.8 (ref 2.0–3.0)

## 2022-10-25 NOTE — Patient Instructions (Signed)
Description   Continue taking warfarin 1 tablet daily except for 1.5 tablets in [place].   Recheck INR in 6 weeks.  [facility] Clinic [phone]

## 2022-11-16 ENCOUNTER — Telehealth: Payer: Self-pay

## 2022-11-16 NOTE — Telephone Encounter (Signed)
   Pre-operative Risk Assessment    Patient Name: Jesse Franklin  DOB: 01/30/1951 MRN: [id number]     Request for Surgical Clearance    Procedure:  20 extractions / deep cleaning   Date of Surgery:  Clearance                                  Surgeon:   Surgeon's Group or Practice Name:  [facility]  Phone number:  [phone] Fax number:  [phone]   Type of Clearance Requested:   - Pharmacy:  Hold Clopidogrel (Plavix) and Warfarin (Coumadin)     Type of Anesthesia:  Local    Additional requests/questions:    Oneal Grout   11/16/2022, 3:59 PM

## 2022-11-17 NOTE — Telephone Encounter (Signed)
Patient has an appointment with Dr. Acie Fredrickson on 11/21/2022 at which time clearance will be addressed.

## 2022-11-18 ENCOUNTER — Encounter: Payer: Self-pay | Admitting: Cardiovascular Disease

## 2022-11-18 NOTE — Progress Notes (Unsigned)
Cardiology Office Note   Date:  11/18/2022   ID:  Jesse Franklin, DOB 10/01/1950, MRN [id number]  PCP:  Leeanne Rio, MD  Cardiologist:   Mertie Moores, MD   Chief Complaint  Patient presents with   Atrial Fibrillation        Coronary Artery Disease        Hyperlipidemia   1. Atrial fibrillation -  2. Coronary artery disease RESULTS: 08/30/04 1. Left main: Normal.   2. LAD: Large vessel, tortuous in the mid and distal section with mild   luminal irregularities. The extreme distal portion of the LAD after the   trifurcation of the distal vessel has a subtotal occlusion at the   trifurcation tip. Vessel less than 0.5 mm at this juncture.   3. LXC: Codominant with mild luminal irregularities.   4. OM1 and OM2: Large vessels with mild luminal irregularities.   5. RCA: Tortuous, codominant with mild luminal irregularities, very faint   right to left filling collaterals seen at the apex.   6. LV: EF 50-55% with mild inferior apical hypokinesis. LVEDP was 35   mmHg.   7. Abdominal/distal aortogram showed mild tortuosity. No significant   aneurysms were seen. The distal aorta showed mild splaying to the left   at the area of the iliac bifurcation. No significant iliac or femoral   disease was noted.  3. Hypertension 4. 6. Moderate pulmonary hypertension by echo in 2010 ( ongoing cigarette smoking)   5. Hyperlipidemia   Jesse Franklin is a 72 yo with hx as noted above. He has occasional episodes of left arm tingling. These episodes last 1-2 seconds. They're not necessarily associated with exertion.  The tingling seems to get better with arm movement. He also has occasional episodes of shortness of breath.  He walks on a regular basis. He walks approximately 1 mile a day.  He still eats some salt and also eats fried and fast foods.  December 02, 2013:  Jesse Franklin is doing well. He denies any chest pain or shortness of breath.  He walks about 15-20 minutes each day.  He no longer  works.  He has cut down on his salt usage.    [name] 5, 2016:   Jesse Franklin is a 72 y.o. male who presents for follow-up of his atrial fibrillation. He also has a history of hypertension  Still smoking  -   Nov. 1, 2016:  Doing well from a cardiac standpoint.  Having gout issues  in left elbow and hand.  No Cp or dyspnea.  Still smoking   January 03, 2016:    Jesse Franklin is seen today  No CP , some back pain  Still smoking    December 25, 2016  occasional cp .  No changes in his angina pattern Still smoking   July 27, 2017:  Staying active , no further CP or dyspnea .  Sells loose cigaretes.  $0.50 per cigarette   May 09, 2018:  Jesse Franklin was seen today for follow-up visit. History of coronary artery disease and atrial fibrillation. He remains on Coumadin. HR is slow (junctional escape rhythm) sees.  He is not having any episodes of weakness or dizziness.  He has occasional episodes of left-sided arm and left-sided shoulder pain. Last few seconds. No chest pain with walking.  He still smokes 3 to 4 cigarettes a day. Also smokes marijuana   He has known coronary artery disease.  He has a distal LAD subtotal occlusion by heart  catheterization in 2005.   December 19, 2019: Jesse Franklin is seen back today for follow up visit for his CAD, atrial fib and HLD Has rare cp.   Does not feel like his pain before his stent .   Last for 3-4 seconds.  Had to take NTG on 1 occasion.   Walks around some  Walking does not bring on the cp.  Typically occurs while he is sitting   Still smokes several cigarettes a day .   We discussed cessation efforts.   January 03, 2021:  Jesse Franklin is seen today for follow up of his CAD, atrial fib, HLD Hx of coronary stenting in the past  Rare chest pain . Left arm tingling ,  Last for several seconds  Not associated with exercise  Walks his every day .  1/4 mile a day  Still smoking 4-5 cigarettes a day    November 25, 2021 Jesse Franklin is seen today for follow up of  his CAD, atrial fib, HLD  Pt needs to have ureteroscopy with rescection of bladder tumor - scheduled for April 21.  Wll need to hold warfarin  He will get those instructions re: coumadin from our coumadin clinic   He is at low risk for   Rare episodes of DOE  Works at [employer] lost some weight while working  No angina similar to prior to his stenting    November 21, 2022 Jesse Franklin is seen for follow up visit  Hx of CAD , atrial fib,       Past Medical History:  Diagnosis Date   2015    Alcohol abuse    Arthritis    Atrial fibrillation ([place])    BPH 11/08/2006   CAD (coronary artery disease)    Branch Vessel   Chronic kidney disease    Cocaine abuse ([place])    Hx of   Depression    GERD (gastroesophageal reflux disease)    Gout    Hallucination    "smoke marijuana to prevent hallucinations", started in 1999, last one 11/2021   Herpes    Herpes genitalia 04/13/2011   History of kidney stones    Hyperlipidemia    Hypertension    Myocardial infarction ([place]) 2005   RHINITIS, ALLERGIC 11/08/2006   Tobacco abuse     Past Surgical History:  Procedure Laterality Date   CARDIAC CATHETERIZATION  08/11/2004   CARPAL TUNNEL RELEASE Right 04/04/2017   Procedure: RIGHT CARPAL TUNNEL RELEASE;  Surgeon: Leandrew Koyanagi, MD;  Location: [facility];  Service: Orthopedics;  Laterality: Right;   CYSTOSCOPY W/ RETROGRADES Bilateral 01/16/2022   Procedure: CYSTOSCOPY WITH RETROGRADE PYELOGRAM;  Surgeon: Lucas Mallow, MD;  Location: [facility];  Service: Urology;  Laterality: Bilateral;   CYSTOSCOPY W/ URETERAL STENT REMOVAL Right 02/13/2022   Procedure: CYSTOSCOPY WITH RIGHT STENT REMOVAL;  Surgeon: Lucas Mallow, MD;  Location: [facility];  Service: Urology;  Laterality: Right;  30 MINS FOR THIS CASE   LACERATION REPAIR Right 03/11/1988   "hand"   URETEROSCOPY Right 01/16/2022   Procedure: URETEROSCOPY WITH LASER LITHOTRIPSY STONE BASKET EXTRACTION, STENT PLACEMENT;   Surgeon: Lucas Mallow, MD;  Location: [facility];  Service: Urology;  Laterality: Right;     Current Outpatient Medications  Medication Sig Dispense Refill   allopurinol (ZYLOPRIM) 100 MG tablet Take 100 mg by mouth 2 (two) times daily.     amLODipine (NORVASC) 10 MG tablet TAKE 1 TABLET BY MOUTH EVERY  DAY 90 tablet 1   Baclofen 5 MG TABS TAKE 1/2 TABLET BY MOUTH DAILY AS NEEDED. 30 tablet 1   clopidogrel (PLAVIX) 75 MG tablet TAKE 1 TABLET BY MOUTH EVERY DAY 90 tablet 1   colchicine 0.6 MG tablet TAKE 1 TABLET BY MOUTH 2 TIMES DAILY. 180 tablet 0   famotidine (PEPCID) 20 MG tablet TAKE 1 TABLET BY MOUTH TWICE A DAY 180 tablet 1   nitroGLYCERIN (NITROSTAT) 0.4 MG SL tablet Place 1 tablet (0.4 mg total) under the tongue as directed. Place 1 tab under tongue as directed 25 tablet 6   pantoprazole (PROTONIX) 40 MG tablet TAKE 1 TABLET BY MOUTH EVERY DAY 90 tablet 1   rosuvastatin (CRESTOR) 40 MG tablet TAKE 1 TABLET BY MOUTH EVERY DAY 90 tablet 3   tamsulosin (FLOMAX) 0.4 MG CAPS capsule TAKE 1 CAPSULE BY MOUTH EVERY DAY 90 capsule 1   warfarin (COUMADIN) 5 MG tablet TAKE 1 TO 1 AND 1/2 TABLETS BY MOUTH ONCE DAILY OR AS DIRECTED BY COUMADIN CLINIC. 100 tablet 0   No current facility-administered medications for this visit.   Facility-Administered Medications Ordered in Other Visits  Medication Dose Route Frequency Provider Last Rate Last Admin   0.9 %  sodium chloride infusion   Intravenous Continuous Willeen Niece, MD 125 mL/hr at 07/31/14 1200 New Bag at 07/31/14 1200    Allergies:   Codeine and Ketorolac tromethamine    Social History:  The patient  reports that he has been smoking cigarettes. He has a 12.00 pack-year smoking history. He quit smokeless tobacco use about 55 years ago.  His smokeless tobacco use included chew. He reports that he does not currently use alcohol. He reports current drug use. Frequency: 3.00 times per week. Drug: Marijuana.   Family History:  The patient's  family history includes Alcohol abuse in his brother and father; Arthritis in his brother; Cancer in his sister; Gout in his mother; Heart disease in his brother; Hypertension in his mother and sister.    ROS:   Noted in current history, otherwise review of systems is negative.  Physical Exam: There were no vitals taken for this visit.  No BP recorded.  {Refresh Note OR Click here to enter BP  :1}***    GEN:  Well nourished, well developed in no acute distress HEENT: Normal NECK: No JVD; No carotid bruits LYMPHATICS: No lymphadenopathy CARDIAC: RRR ***, no murmurs, rubs, gallops RESPIRATORY:  Clear to auscultation without rales, wheezing or rhonchi  ABDOMEN: Soft, non-tender, non-distended MUSCULOSKELETAL:  No edema; No deformity  SKIN: Warm and dry NEUROLOGIC:  Alert and oriented x 3    EKG:         Recent Labs: 02/10/2022: Hemoglobin 12.7; Platelets 187 04/17/2022: BUN 10; Creatinine, Ser 1.27; Potassium 4.5; Sodium 142    Lipid Panel    Component Value Date/Time   CHOL 120 04/17/2022 1146   TRIG 102 04/17/2022 1146   HDL 44 04/17/2022 1146   CHOLHDL 2.7 04/17/2022 1146   CHOLHDL 3.4 08/02/2016 0913   VLDL 26 08/02/2016 0913   LDLCALC 57 04/17/2022 1146   LDLDIRECT 95 05/28/2012 0925      Wt Readings from Last 3 Encounters:  09/08/22 167 lb (75.8 kg)  08/07/22 167 lb (75.8 kg)  04/17/22 159 lb 9.6 oz (72.4 kg)      Other studies Reviewed: Additional studies/ records that were reviewed today include: . Review of the above records demonstrates:    ASSESSMENT AND PLAN:  1. Atrial fibrillation-       2. Coronary artery disease Cath  08/30/04   .Marland Kitchen  3. Hypertension -     4.  Moderate pulmonary hypertension by echo in 2010 ( ongoing cigarette smoking)  -.     5. Hyperlipidemia -       Current medicines are reviewed at length with the patient today.  The patient does not have concerns regarding medicines.  The following changes have been made:  no  change  Labs/ tests ordered today include:   No orders of the defined types were placed in this encounter.   Disposition:       Mertie Moores, MD  11/18/2022 1:59 PM    [facility] [place], [place], [place]  [address] Phone: [phone]; Fax: [phone]

## 2022-11-20 NOTE — Telephone Encounter (Signed)
Patient with diagnosis of atrial fibrillation on warfarin for anticoagulation.    Procedure: 20 dental extractions Date of procedure: TBD   CHA2DS2-VASc Score = 3   This indicates a 3.2% annual risk of stroke. The patient's score is based upon: CHF History: 0 HTN History: 1 Diabetes History: 0 Stroke History: 0 Vascular Disease History: 1 Age Score: 1 Gender Score: 0    CrCl 56 Platelet count 155  Patient does not require pre-op antibiotics for dental procedure.  Per office protocol, patient can hold warfarin for 5 days prior to procedure.   Patient will not need bridging with Lovenox (enoxaparin) around procedure.  **This guidance is not considered finalized until pre-operative APP has relayed final recommendations.**

## 2022-11-21 ENCOUNTER — Encounter: Payer: Self-pay | Admitting: Cardiovascular Disease

## 2022-11-21 ENCOUNTER — Ambulatory Visit: Payer: Medicare HMO | Attending: Cardiovascular Disease | Admitting: Cardiovascular Disease

## 2022-11-21 VITALS — BP 126/66 | HR 45 | Ht 65.5 in | Wt 165.0 lb

## 2022-11-21 DIAGNOSIS — I4891 Unspecified atrial fibrillation: Secondary | ICD-10-CM | POA: Diagnosis not present

## 2022-11-21 DIAGNOSIS — I10 Essential (primary) hypertension: Secondary | ICD-10-CM | POA: Diagnosis not present

## 2022-11-21 NOTE — Patient Instructions (Signed)
Medication Instructions:  Your physician recommends that you continue on your current medications as directed. Please refer to the Current Medication list given to you today.  *If you need a refill on your cardiac medications before your next appointment, please call your pharmacy*   Lab Work: NONE If you have labs (blood work) drawn today and your tests are completely normal, you will receive your results only by: [place] (if you have MyChart) OR A paper copy in the mail If you have any lab test that is abnormal or we need to change your treatment, we will call you to review the results.   Testing/Procedures: NONE   Follow-Up: At [facility], you and your health needs are our priority.  As part of our continuing mission to provide you with exceptional heart care, we have created designated Provider Care Teams.  These Care Teams include your primary Cardiologist (physician) and Advanced Practice Providers (APPs -  Physician Assistants and Nurse Practitioners) who all work together to provide you with the care you need, when you need it.  We recommend signing up for the patient portal called "MyChart".  Sign up information is provided on this After Visit Summary.  MyChart is used to connect with patients for Virtual Visits (Telemedicine).  Patients are able to view lab/test results, encounter notes, upcoming appointments, etc.  Non-urgent messages can be sent to your provider as well.   To learn more about what you can do with MyChart, go to [email or web address].    Your next appointment:   6 month(s)  Provider:   APP:  Renaldo Fiddler, Lisette Abu, [place]

## 2022-11-30 ENCOUNTER — Other Ambulatory Visit: Payer: Self-pay | Admitting: Family Medicine

## 2022-12-04 ENCOUNTER — Encounter: Payer: Self-pay | Admitting: *Deleted

## 2022-12-06 ENCOUNTER — Ambulatory Visit: Payer: Medicare HMO | Attending: Cardiovascular Disease

## 2022-12-06 DIAGNOSIS — Z5181 Encounter for therapeutic drug level monitoring: Secondary | ICD-10-CM | POA: Diagnosis not present

## 2022-12-06 DIAGNOSIS — I4891 Unspecified atrial fibrillation: Secondary | ICD-10-CM

## 2022-12-06 LAB — POCT INR: INR: 3.4 — AB (ref 2.0–3.0)

## 2022-12-06 NOTE — Patient Instructions (Signed)
Description   Skip today's dosage of Warfarin, then resume same dosage 1 tablet daily except for 1.5 tablets in [place].   Recheck INR in 4 weeks.  [facility] [phone]

## 2022-12-20 ENCOUNTER — Telehealth: Payer: Self-pay | Admitting: Cardiovascular Disease

## 2022-12-20 NOTE — Telephone Encounter (Signed)
I will have pre op APP and Dr. Elease Hashimoto review ov note if pt has been cleared. Pt was Dr. Elease Hashimoto 11/21/22, appt notes states need pre op clearance. I do not see this is addressed in the ov note from MD.

## 2022-12-20 NOTE — Telephone Encounter (Addendum)
Dental office is calling checking on status of clearance.   I will have pre op APP and Dr. Elease Hashimoto review ov note if pt has been cleared. Pt was Dr. Elease Hashimoto 11/21/22, appt notes states need pre op clearance. I do not see this is addressed in the ov note from MD.   Please review clearance notes, see that Plavix and warfarin are being requested to be held. Pharm-D provided recs for warfarin. If MD is in agreement and clearing the pt, ov note will need to be amended to reflect agreement with pharm-d for warfarin. Will need to provide recommendations for Plavix and give written ok pt has been cleared.

## 2022-12-20 NOTE — Telephone Encounter (Signed)
Office calling to f/u on Clearance that was sent on 3/7. Please advise

## 2022-12-25 NOTE — Telephone Encounter (Signed)
   Patient Name: Jesse Franklin  DOB: [birth year] MRN: [id number]  Primary Cardiologist: Kristeen Miss, MD  Chart reviewed as part of pre-operative protocol coverage. Pre-op clearance already addressed by colleagues in earlier phone notes. To summarize recommendations:  - Pt needs to have 20 teeth extracted.Pt is at low risk from a cardiac standpoint Ok to hold Plavix for 5 days prior to procedure See Pharm D response for warfarin and bridging .  -Dr. Elease Hashimoto  Per office protocol, patient can hold warfarin for 5 days prior to procedure.   Patient will not need bridging with Lovenox (enoxaparin) around procedure.  Will route this bundled recommendation to requesting provider via Epic fax function and remove from pre-op pool. Please call with questions.  Sharlene Dory, PA-C 12/25/2022, 12:22 PM

## 2022-12-26 ENCOUNTER — Telehealth: Payer: Self-pay

## 2022-12-26 NOTE — Telephone Encounter (Signed)
Spoke with patient regarding Lung screening referral. Patient reports his smoking history as smoked for 54.5 years but "never smoked more than 3-5 cigarettes per day."  Due to light smoking history, patient would not meet minimum criteria of 20 pack years for the LDCT under LCS guidelines.  Patient pack years would be 13.6 based on his reported history.  Patient is interested in a CT Chest if his PCP would recommend.  Advised will route information to PCP for review/recommendation.

## 2022-12-26 NOTE — Telephone Encounter (Signed)
ADDENDUM: PT IS ONLY HAVING 1 TOOTH EXTRACTED; NOT 20 TEETH.

## 2022-12-26 NOTE — Telephone Encounter (Signed)
    Primary Cardiologist: Kristeen Miss, MD  Chart reviewed as part of pre-operative protocol coverage. Simple dental extractions are considered low risk procedures per guidelines and generally do not require any specific cardiac clearance. It is also generally accepted that for simple extractions and dental cleanings, there is no need to interrupt blood thinner therapy.   SBE prophylaxis is not required for the patient.  I will route this recommendation to the requesting party via Epic fax function and remove from pre-op pool.  Please call with questions.  Sharlene Dory, PA-C 12/26/2022, 2:03 PM

## 2022-12-26 NOTE — Telephone Encounter (Signed)
With his smoking history he does not qualify for screening CT chest, and I don't think he needs a diagnostic CT.   Please let patient know he doesn't need a CT of his chest.  Thanks Latrelle Dodrill, MD

## 2022-12-27 ENCOUNTER — Other Ambulatory Visit: Payer: Self-pay | Admitting: Nurse Practitioner

## 2022-12-27 DIAGNOSIS — K7581 Nonalcoholic steatohepatitis (NASH): Secondary | ICD-10-CM

## 2022-12-28 NOTE — Telephone Encounter (Signed)
Pt informed. Michi Herrmann, CMA  

## 2023-01-03 ENCOUNTER — Ambulatory Visit: Payer: Medicare HMO | Attending: Internal Medicine | Admitting: *Deleted

## 2023-01-03 DIAGNOSIS — Z5181 Encounter for therapeutic drug level monitoring: Secondary | ICD-10-CM | POA: Diagnosis not present

## 2023-01-03 DIAGNOSIS — I4891 Unspecified atrial fibrillation: Secondary | ICD-10-CM

## 2023-01-03 LAB — POCT INR: INR: 3.2 — AB (ref 2.0–3.0)

## 2023-01-03 NOTE — Patient Instructions (Signed)
Description   Skip today's dosage of Warfarin, then START taking warfarin 1 tablet daily except for 1.5 tablets in MONDAYS. Recheck INR in 4 weeks.  Coumadin Clinic [phone]

## 2023-01-25 ENCOUNTER — Other Ambulatory Visit: Payer: Self-pay | Admitting: Cardiovascular Disease

## 2023-01-25 ENCOUNTER — Ambulatory Visit (INDEPENDENT_AMBULATORY_CARE_PROVIDER_SITE_OTHER): Payer: Medicare HMO | Admitting: Family Medicine

## 2023-01-25 ENCOUNTER — Other Ambulatory Visit: Payer: Self-pay | Admitting: Family Medicine

## 2023-01-25 ENCOUNTER — Ambulatory Visit: Payer: Medicare HMO

## 2023-01-25 ENCOUNTER — Encounter: Payer: Self-pay | Admitting: Family Medicine

## 2023-01-25 VITALS — BP 117/58 | HR 87 | Ht 65.5 in | Wt 164.0 lb

## 2023-01-25 DIAGNOSIS — N481 Balanitis: Secondary | ICD-10-CM

## 2023-01-25 DIAGNOSIS — N4889 Other specified disorders of penis: Secondary | ICD-10-CM | POA: Diagnosis not present

## 2023-01-25 DIAGNOSIS — N471 Phimosis: Secondary | ICD-10-CM | POA: Insufficient documentation

## 2023-01-25 DIAGNOSIS — I4891 Unspecified atrial fibrillation: Secondary | ICD-10-CM

## 2023-01-25 MED ORDER — TERBINAFINE HCL 1 % EX CREA
1.0000 | TOPICAL_CREAM | Freq: Two times a day (BID) | CUTANEOUS | 0 refills | Status: DC
Start: 2023-01-25 — End: 2023-08-23

## 2023-01-25 NOTE — Telephone Encounter (Signed)
Warfarin 5mg  refill Afib Last INR 01/03/23 Last OV 11/21/22

## 2023-01-25 NOTE — Progress Notes (Signed)
    SUBJECTIVE:   CHIEF COMPLAINT / HPI:   Patient presents with concern of penile swelling that started last week. Denies any worsening swelling when it started. Denies pain  except when retracting, penile discharge, fever, chills, dysuria, abdominal pain or scrotal pain. He tried to put vaseline on it which helped a lot. Swelling has come down since it first started. Says that sometimes he wears tight pants but otherwise no new clothes. A lot of walking does not bother him. Denies any new partners. Denies any new medications. Does not use catheter or have issues with urinating. Urinating normal. Denies chest pain, shortness of breath or swelling elsewhere. Takes flomax 0.4 mg for BPH, has seen urology in the past but no recent procedures.   OBJECTIVE:   BP (!) 117/58   Pulse 87   Ht 5' 5.5" (1.664 m)   Wt 164 lb (74.4 kg)   SpO2 97%   BMI 26.88 kg/m   General: Patient well-appearing, in no acute distress. CV: irregularly irregular, no murmurs or gallops auscultated Resp: CTAB, no wheezing, rales or rhonchi noted Abdomen: soft, nontender, nondistended, presence of bowel sounds GU: mild penile swelling noted, no erythema or rashes noted, inflammation noted along urethral meatus, no drainage or color changes noted    Chaperone, Cleatrice Burke, CMA, present during sensitive part of the exam.   ASSESSMENT/PLAN:   Penile swelling -likely consistent with balanitis. Also considered epididymitis, STI, phimosis, urethral obstruction, inguinal hernia and cardiac etiology although less likely given history and exam -terbinafine prescribed, med instructions discussed and advised to keep area dry to prevent moisture -strict return precautions discussed      Reece Leader, DO [facility]

## 2023-01-25 NOTE — Assessment & Plan Note (Addendum)
-  likely consistent with balanitis. Also considered epididymitis, STI, phimosis, urethral obstruction, inguinal hernia and cardiac etiology although less likely given history and exam -terbinafine prescribed, med instructions discussed and advised to keep area dry to prevent moisture -strict return precautions discussed

## 2023-01-25 NOTE — Patient Instructions (Signed)
It was great seeing you today!  Today we discussed your swelling, this is due to inflammation that is causing a condition called balanitis. I have prescribed terbinafine cream, please apply this twice a day. Make sure to retract and keep the area dry as moisture can make this worse.  If it is not improving within 2-3 days then please return for another appointment.   Please follow up at your next scheduled appointment, if anything arises between now and then, please don't hesitate to contact our office.   Thank you for allowing [place] to be a part of your medical care!  Thank you, Dr. Robyne Peers

## 2023-01-29 NOTE — Progress Notes (Unsigned)
  SUBJECTIVE:   CHIEF COMPLAINT / HPI:   Endorses no improvement of penile swelling. Leaking blood from the tip of penis.  He notes that he is still urinating appropriately, some discharge.  No recent sexual intercourse.  He is unable to retract the foreskin and has had trace bleeding and flying of the skin.  He is also concerned of a newly developed boil which he notes has had pus draining from it at 1 point.   Of note, seen on 5/16 by Dr. Robyne Peers for penile swelling and prescribed terbinafine for balanitis. Notes the terbinafine was somewhat helpful.  PERTINENT  PMH / PSH: Afib on warfarin, CAD, gout, HTN, HLD, BPH, GERD  Patient Care Team: Latrelle Dodrill, MD as PCP - General (Pediatrics) Nahser, Deloris Ping, MD as PCP - Cardiology (Cardiology) OBJECTIVE:  BP 114/68   Pulse 62   Ht 5' 5.5" (1.664 m)   Wt 167 lb 9.6 oz (76 kg)   SpO2 100%   BMI 27.47 kg/m  Physical Exam Exam conducted with a chaperone present.  Genitourinary:    Penis: Uncircumcised. Tenderness and swelling present. No discharge.      Comments: 1.5 cm cystic structure in the right inguinal region with overlying white flaking concerning for boil without current opening  Chaperone present: Cleatrice Burke  ASSESSMENT/PLAN:  Balanitis Assessment & Plan: Uncomfortable, still able to urinate.  Concerning picture therefore placed urgent referral to urology.   Orders: -     Fluconazole; Take 1 tablet (150 mg total) by mouth once for 1 dose.  Dispense: 1 tablet; Refill: 0 -     Hydrocortisone; Apply 1 Application topically 2 (two) times daily.  Dispense: 30 g; Refill: 0 -     Ambulatory referral to Urology  Boil of inguinal region Assessment & Plan: Will initiate antibiotic treatment for this.  Advised patient to discuss with cardiology regarding his warfarin and INR given he is being initiated on doxycycline which may interact with this and affect his INR.  This boil should be reevaluated in 1 week  Orders: -      Doxycycline Hyclate; Take 1 tablet (100 mg total) by mouth 2 (two) times daily.  Dispense: 20 tablet; Refill: 0  Return in about 1 week (around 02/06/2023) for Balanitis and boil follow-up. Shelby Mattocks, DO 01/30/2023, 1:41 PM PGY-2, [facility]

## 2023-01-30 ENCOUNTER — Ambulatory Visit (INDEPENDENT_AMBULATORY_CARE_PROVIDER_SITE_OTHER): Payer: Medicare HMO | Admitting: Student

## 2023-01-30 VITALS — BP 114/68 | HR 62 | Ht 65.5 in | Wt 167.6 lb

## 2023-01-30 DIAGNOSIS — N481 Balanitis: Secondary | ICD-10-CM

## 2023-01-30 DIAGNOSIS — L02224 Furuncle of groin: Secondary | ICD-10-CM | POA: Insufficient documentation

## 2023-01-30 MED ORDER — HYDROCORTISONE 1 % EX OINT
1.0000 | TOPICAL_OINTMENT | Freq: Two times a day (BID) | CUTANEOUS | 0 refills | Status: DC
Start: 2023-01-30 — End: 2023-05-11

## 2023-01-30 MED ORDER — DOXYCYCLINE HYCLATE 100 MG PO TABS: 100.0000 mg | ORAL_TABLET | Freq: Two times a day (BID) | ORAL | 0 refills | Status: DC

## 2023-01-30 MED ORDER — FLUCONAZOLE 150 MG PO TABS
150.0000 mg | ORAL_TABLET | Freq: Once | ORAL | 0 refills | Status: AC
Start: 2023-01-30 — End: 2023-01-30

## 2023-01-30 NOTE — Assessment & Plan Note (Addendum)
Uncomfortable, still able to urinate.  Concerning picture therefore placed urgent referral to urology.

## 2023-01-30 NOTE — Assessment & Plan Note (Signed)
Will initiate antibiotic treatment for this.  Advised patient to discuss with cardiology regarding his warfarin and INR given he is being initiated on doxycycline which may interact with this and affect his INR.  This boil should be reevaluated in 1 week

## 2023-01-30 NOTE — Patient Instructions (Addendum)
It was great to see you today! Thank you for choosing [facility] for your primary care. Jesse Franklin was seen for phimosis and boil.  Today we addressed: For the boil I am prescribing you doxycycline. For this penile swelling, I am prescribing you an oral antifungal, steroid cream, and sending you to urology.  Please talk with cardiology tomorrow and mention that you have been placed on this medication so they can pay attention to your INR level regarding your warfarin.  If you haven't already, sign up for My Chart to have easy access to your labs results, and communication with your primary care physician.  Call the clinic at [phone] if your symptoms worsen or you have any concerns.  You should return to our clinic Return in about 1 week (around 02/06/2023) for Balanitis follow-up. Please arrive 15 minutes before your appointment to ensure smooth check in process.  We appreciate your efforts in making this happen.  Thank you for allowing me to participate in your care, Shelby Mattocks, DO 01/30/2023, 10:13 AM PGY-2, [facility]

## 2023-01-31 ENCOUNTER — Ambulatory Visit: Payer: Medicare HMO | Attending: Cardiovascular Disease | Admitting: *Deleted

## 2023-01-31 DIAGNOSIS — Z5181 Encounter for therapeutic drug level monitoring: Secondary | ICD-10-CM

## 2023-01-31 DIAGNOSIS — I4891 Unspecified atrial fibrillation: Secondary | ICD-10-CM | POA: Diagnosis not present

## 2023-01-31 LAB — POCT INR: INR: 3.9 — AB (ref 2.0–3.0)

## 2023-01-31 NOTE — Patient Instructions (Signed)
Description   Do not take any warfarin today then take 1 tablets daily except 1/2 tablet on Sunday. New dose is warfarin 1 tablet daily. Recheck INR on Tuesday. Have some extra leafy veggies while on your new meds. Starting diflucan 150mg  one dose & doxycycline 100mg  twice a day x 10 days.  [facility] [phone] or [phone]

## 2023-02-06 ENCOUNTER — Ambulatory Visit: Payer: Medicare HMO | Attending: Cardiovascular Disease

## 2023-02-06 DIAGNOSIS — Z5181 Encounter for therapeutic drug level monitoring: Secondary | ICD-10-CM

## 2023-02-06 DIAGNOSIS — I4891 Unspecified atrial fibrillation: Secondary | ICD-10-CM | POA: Diagnosis not present

## 2023-02-06 LAB — POCT INR: INR: 2.8 (ref 2.0–3.0)

## 2023-02-06 NOTE — Patient Instructions (Signed)
Continue 1 tablet daily. Recheck INR in 4 weeks. [facility] [phone] or [phone]

## 2023-02-07 ENCOUNTER — Encounter: Payer: Self-pay | Admitting: Family Medicine

## 2023-02-07 ENCOUNTER — Ambulatory Visit (INDEPENDENT_AMBULATORY_CARE_PROVIDER_SITE_OTHER): Payer: Medicare HMO | Admitting: Family Medicine

## 2023-02-07 VITALS — BP 104/56 | HR 42 | Wt 161.4 lb

## 2023-02-07 DIAGNOSIS — L02224 Furuncle of groin: Secondary | ICD-10-CM

## 2023-02-07 DIAGNOSIS — N471 Phimosis: Secondary | ICD-10-CM

## 2023-02-07 DIAGNOSIS — N481 Balanitis: Secondary | ICD-10-CM | POA: Diagnosis not present

## 2023-02-07 NOTE — Progress Notes (Unsigned)
    SUBJECTIVE:   CHIEF COMPLAINT / HPI:   Balanitis Follow-Up Was seen for balanitis on 01/25/23 and 01/30/23.  At last visit his foreskin was erythematous, swollen, with maceration at tip and unable to retract at all due to pain and swelling.  He was treated with p.o. Diflucan and topical hydrocortisone.  Was referred to urology.  Was also treated with course of doxycycline for boil in right inguinal region.  Returns today for follow-up.  Reports his symptoms are significantly improved.  No longer having pain, reports everything has cleared up.  Urinating normally.  Has not heard from urology.  Has a few days left of doxycycline.  PERTINENT  PMH / PSH: HTN, HLD, CAD, A-fib, BPH, gout  OBJECTIVE:   BP (!) 104/56   Pulse (!) 42   Wt 161 lb 6.4 oz (73.2 kg)   BMI 26.45 kg/m   General: NAD, pleasant, able to participate in exam Respiratory: No respiratory distress Skin: warm and dry, no rashes noted Psych: Normal affect and mood Neuro: grossly intact GU: Exam performed in the presence of a chaperone.  Able to fully retract foreskin.  No penile discharge, lesions, skin breakdown, or other abnormality. ~1cm area of thickened skin in right inguinal region without induration, fluctuance, drainage, or erythema, nontender to palpation  ASSESSMENT/PLAN:   Phimosis of penis Resolved.  No further evidence of balanitis today.  It would still be reasonable to follow-up with urology and he was given their contact information in AVS.  Boil of inguinal region Resolving.  No evidence of infection on today's exam.  He will continue course of doxycycline as previously prescribed (few days left).  Return if worsening.   Bradycardia Chronic. Follows with cardiology.  Maury Dus, MD [facility]

## 2023-02-07 NOTE — Patient Instructions (Addendum)
It was great to meet you!  I'm glad your symptoms are much better!!  I still recommend seeing the urologist. Their info is: [facility] Address: [address] [place] [place], [place] [address] Phone: [phone]   Take care, Dr Anner Crete

## 2023-02-08 NOTE — Assessment & Plan Note (Signed)
Resolving.  No evidence of infection on today's exam.  He will continue course of doxycycline as previously prescribed (few days left).  Return if worsening.

## 2023-02-08 NOTE — Assessment & Plan Note (Signed)
Resolved.  No further evidence of balanitis today.  It would still be reasonable to follow-up with urology and he was given their contact information in AVS.

## 2023-03-07 ENCOUNTER — Ambulatory Visit: Payer: Medicare HMO | Attending: Cardiovascular Disease | Admitting: *Deleted

## 2023-03-07 DIAGNOSIS — Z5181 Encounter for therapeutic drug level monitoring: Secondary | ICD-10-CM | POA: Diagnosis not present

## 2023-03-07 DIAGNOSIS — I4891 Unspecified atrial fibrillation: Secondary | ICD-10-CM | POA: Diagnosis not present

## 2023-03-07 LAB — POCT INR: INR: 2.4 (ref 2.0–3.0)

## 2023-03-07 NOTE — Patient Instructions (Signed)
Description   Continue 1 tablet daily. Recheck INR in 5 weeks. [facility]madin Clinic [phone] or [phone]

## 2023-03-20 ENCOUNTER — Other Ambulatory Visit: Payer: Self-pay | Admitting: Family Medicine

## 2023-04-03 ENCOUNTER — Other Ambulatory Visit: Payer: Medicare HMO

## 2023-04-03 DIAGNOSIS — K7581 Nonalcoholic steatohepatitis (NASH): Secondary | ICD-10-CM

## 2023-04-10 ENCOUNTER — Telehealth: Payer: Self-pay | Admitting: *Deleted

## 2023-04-10 NOTE — Telephone Encounter (Signed)
   Pre-operative Risk Assessment    Patient Name: Jesse Franklin  DOB: 1951-02-10 MRN: [id number]     Request for Surgical Clearance    Procedure:  Dental Extraction - Amount of Teeth to be Pulled:  1  Date of Surgery:  Clearance TBD WAITING ON CLEARANCE TO EXTRACT                                Surgeon:  Rogelio Seen, DDS Surgeon's Group or Practice Name:  [facility] Phone number:  [phone] Fax number:  [phone]   Type of Clearance Requested:   - Pharmacy:  Hold Warfarin (Coumadin) THEY ARE ASKING IF PT NEEDS TO HOLD COUMADIN AND IF SO, HOW LONG   Type of Anesthesia:  Not Indicated   Additional requests/questions:    [name]   04/10/2023, 9:42 AM

## 2023-04-10 NOTE — Telephone Encounter (Signed)
   Patient Name: Jesse Franklin  DOB: Sep 04, 1951 MRN: [id number]  Primary Cardiologist: Kristeen Miss, MD  Chart reviewed as part of pre-operative protocol coverage.    Simple dental extractions (i.e. 1-2 teeth) are considered low risk procedures per guidelines and generally do not require any specific cardiac clearance. It is also generally accepted that for simple extractions and dental cleanings, there is no need to interrupt blood thinner therapy.  SBE prophylaxis is not required for the patient from a cardiac standpoint.  I will route this recommendation to the requesting party via Epic fax function and remove from pre-op pool.  Please call with questions.  Napoleon Form, Leodis Rains, NP 04/10/2023, 9:55 AM

## 2023-04-11 ENCOUNTER — Other Ambulatory Visit: Payer: Self-pay | Admitting: Family Medicine

## 2023-04-11 ENCOUNTER — Other Ambulatory Visit: Payer: Self-pay | Admitting: Cardiovascular Disease

## 2023-04-11 ENCOUNTER — Ambulatory Visit: Payer: Medicare HMO | Attending: Cardiovascular Disease | Admitting: *Deleted

## 2023-04-11 DIAGNOSIS — I4891 Unspecified atrial fibrillation: Secondary | ICD-10-CM

## 2023-04-11 DIAGNOSIS — Z5181 Encounter for therapeutic drug level monitoring: Secondary | ICD-10-CM | POA: Diagnosis not present

## 2023-04-11 LAB — POCT INR: INR: 1.9 — AB (ref 2.0–3.0)

## 2023-04-11 NOTE — Patient Instructions (Addendum)
Description   Today take 1.5 tablets of warfarin then continue taking the dose you have been taking, which is, 1 tablet daily except 1.5 tablets on Fridays. Recheck INR in 4 weeks. Coumadin Clinic [phone] or [phone]

## 2023-04-12 ENCOUNTER — Telehealth: Payer: Self-pay | Admitting: Cardiovascular Disease

## 2023-04-12 NOTE — Telephone Encounter (Signed)
Jesse Franklin is calling requesting the patient's medication list be sent to the same fax number the clearance was sent to due to the patient not knowing what he takes. Please advise.

## 2023-04-12 NOTE — Telephone Encounter (Signed)
Faxed over medication list to dentist office.

## 2023-05-09 ENCOUNTER — Ambulatory Visit: Payer: Medicare HMO

## 2023-05-10 NOTE — Progress Notes (Signed)
Office Visit    Patient Name: Jesse Franklin Date of Encounter: 05/11/2023  Primary Care Provider:  Latrelle Dodrill, MD Primary Cardiologist:  Kristeen Miss, MD Primary Electrophysiologist: None   Past Medical History    Past Medical History:  Diagnosis Date   2015    Alcohol abuse    Arthritis    Atrial fibrillation [facility])    BPH 11/08/2006   CAD (coronary artery disease)    Branch Vessel   Chronic kidney disease    Cocaine abuse (HCC)    Hx of   Depression    GERD (gastroesophageal reflux disease)    Gout    Hallucination    "smoke marijuana to prevent hallucinations", started in 1999, last one 11/2021   Herpes    Herpes genitalia 04/13/2011   History of kidney stones    Hyperlipidemia    Hypertension    Myocardial infarction (HCC) 2005   RHINITIS, ALLERGIC 11/08/2006   Tobacco abuse    Past Surgical History:  Procedure Laterality Date   CARDIAC CATHETERIZATION  08/11/2004   CARPAL TUNNEL RELEASE Right 04/04/2017   Procedure: RIGHT CARPAL TUNNEL RELEASE;  Surgeon: Tarry Kos, MD;  Location: [facility];  Service: Orthopedics;  Laterality: Right;   CYSTOSCOPY W/ RETROGRADES Bilateral 01/16/2022   Procedure: CYSTOSCOPY WITH RETROGRADE PYELOGRAM;  Surgeon: Crista Elliot, MD;  Location: [facility];  Service: Urology;  Laterality: Bilateral;   CYSTOSCOPY W/ URETERAL STENT REMOVAL Right 02/13/2022   Procedure: CYSTOSCOPY WITH RIGHT STENT REMOVAL;  Surgeon: Crista Elliot, MD;  Location: [facility];  Service: Urology;  Laterality: Right;  30 MINS FOR THIS CASE   LACERATION REPAIR Right 03/11/1988   "hand"   URETEROSCOPY Right 01/16/2022   Procedure: URETEROSCOPY WITH LASER LITHOTRIPSY STONE BASKET EXTRACTION, STENT PLACEMENT;  Surgeon: Crista Elliot, MD;  Location: [facility];  Service: Urology;  Laterality: Right;    Allergies  Allergies  Allergen Reactions   Codeine Hives   Ketorolac Tromethamine Hives     History of Present Illness     Jesse Franklin  is a 72year old male with a PMH of CAD s/p distal LAD subtotal occlusion 2005, HLD, permanent A-fib (on Coumadin), HTN, pulmonary hypertension, GERD, tobacco abuse who presents today for 68-month follow-up.  [name] was seen last in our clinic on 11/21/2022 by Dr. Elease Hashimoto.  During visit he was doing well with no complaints of angina.  He is in chronic atrial fibrillation and had stable INRs with controlled heart rate.  He is continuing however to smoke cigarettes and has a history of moderate pulmonary hypertension.  2D echo was last completed 05/2018 with EF of 60 to 65%, no RWMA with moderate increased PASP of 41 mmHg.  There were no medication changes made at that visit.  Since last being seen in the office patient reports that he is doing well with no new cardiac complaints since previous visit.  He is still working 5 hours a day at the schedule.  He is otherwise not staying active reports some shortness of breath with activities.  He continues to smoke.  His blood pressure today was controlled at 112/54 and heart rate was 43 bpm.  EKG was completed showing rate controlled junctional rhythm.  Patient denies chest pain, palpitations, dyspnea, PND, orthopnea, nausea, vomiting, dizziness, syncope, edema, weight gain, or early satiety.   Home Medications    Current Outpatient Medications  Medication Sig Dispense Refill  allopurinol (ZYLOPRIM) 100 MG tablet Take 100 mg by mouth 2 (two) times daily.     amLODipine (NORVASC) 10 MG tablet TAKE 1 TABLET BY MOUTH EVERY DAY 90 tablet 1   Baclofen 5 MG TABS TAKE 1/2 TABLET BY MOUTH DAILY AS NEEDED 45 tablet 1   clopidogrel (PLAVIX) 75 MG tablet TAKE 1 TABLET BY MOUTH EVERY DAY 90 tablet 2   colchicine 0.6 MG tablet TAKE 1 TABLET BY MOUTH 2 TIMES DAILY. 180 tablet 0   famotidine (PEPCID) 20 MG tablet TAKE 1 TABLET BY MOUTH TWICE A DAY 180 tablet 1   losartan (COZAAR) 50 MG tablet Take 50 mg by mouth daily.     nitroGLYCERIN (NITROSTAT)  0.4 MG SL tablet Place 1 tablet (0.4 mg total) under the tongue as directed. Place 1 tab under tongue as directed 25 tablet 6   pantoprazole (PROTONIX) 40 MG tablet TAKE 1 TABLET BY MOUTH EVERY DAY 90 tablet 1   rosuvastatin (CRESTOR) 40 MG tablet TAKE 1 TABLET BY MOUTH EVERY DAY 90 tablet 3   tamsulosin (FLOMAX) 0.4 MG CAPS capsule TAKE 1 CAPSULE BY MOUTH EVERY DAY 90 capsule 1   terbinafine (LAMISIL) 1 % cream Apply 1 Application topically 2 (two) times daily. 30 g 0   warfarin (COUMADIN) 5 MG tablet TAKE 1 TO 1 AND 1/2 TABLETS BY MOUTH ONCE DAILY OR AS DIRECTED BY COUMADIN CLINIC. 100 tablet 1   No current facility-administered medications for this visit.   Facility-Administered Medications Ordered in Other Visits  Medication Dose Route Frequency Provider Last Rate Last Admin   0.9 %  sodium chloride infusion   Intravenous Continuous Barbaraann Barthel, MD 125 mL/hr at 07/31/14 1200 New Bag at 07/31/14 1200     Review of Systems  Please see the history of present illness.    (+) Fatigue (+) Shortness of breath with activity  All other systems reviewed and are otherwise negative except as noted above.  Physical Exam    Wt Readings from Last 3 Encounters:  05/11/23 160 lb (72.6 kg)  02/07/23 161 lb 6.4 oz (73.2 kg)  01/30/23 167 lb 9.6 oz (76 kg)   VS: Vitals:   05/11/23 1334  BP: (!) 112/54  Pulse: (!) 43  SpO2: 95%  ,Body mass index is 26.22 kg/m.  Constitutional:      Appearance: Healthy appearance. Not in distress.  Neck:     Vascular: JVD normal.  Pulmonary:     Effort: Pulmonary effort is normal.     Breath sounds: No wheezing. No rales. Diminished in the bases Cardiovascular:     Normal rate. Regular rhythm. Normal S1. Normal S2.      Murmurs: There is no murmur.  Edema:    Peripheral edema absent.  Abdominal:     Palpations: Abdomen is soft non tender. There is no hepatomegaly.  Skin:    General: Skin is warm and dry.  Neurological:     General: No focal  deficit present.     Mental Status: Alert and oriented to person, place and time.     Cranial Nerves: Cranial nerves are intact.  EKG/LABS/ Recent Cardiac Studies    ECG personally reviewed by me today -atrial fibrillation with junctional rhythm with rate of 43 bpm and no acute changes consistent with previous EKG.   Risk Assessment/Calculations:    CHA2DS2-VASc Score = 3   This indicates a 3.2% annual risk of stroke. The patient's score is based upon: CHF History: 1 HTN  History: 1 Diabetes History: 0 Stroke History: 0 Vascular Disease History: 0 Age Score: 1 Gender Score: 0           Lab Results  Component Value Date   WBC 3.9 (L) 02/10/2022   HGB 12.7 (L) 02/10/2022   HCT 37.4 (L) 02/10/2022   MCV 93.7 02/10/2022   PLT 187 02/10/2022   Lab Results  Component Value Date   CREATININE 1.27 04/17/2022   BUN 10 04/17/2022   NA 142 04/17/2022   K 4.5 04/17/2022   CL 107 (H) 04/17/2022   CO2 20 04/17/2022   Lab Results  Component Value Date   ALT 34 01/03/2021   AST 35 12/19/2019   ALKPHOS 91 12/19/2019   BILITOT 0.4 12/19/2019   Lab Results  Component Value Date   CHOL 120 04/17/2022   HDL 44 04/17/2022   LDLCALC 57 04/17/2022   LDLDIRECT 95 05/28/2012   TRIG 102 04/17/2022   CHOLHDL 2.7 04/17/2022    Lab Results  Component Value Date   HGBA1C 5.4 11/04/2020     Assessment & Plan    1.  Coronary artery disease: -CAD with distal LAD subtotal occlusion by left heart cath 2005 -Today patient reports no anginal complaints or chest pain. -Continue current GDMT with Plavix 75 mg, Crestor 40 mg  2.  Permanent atrial fibrillation: -Patient currently on warfarin and is currently rate controlled junctional rhythm of 43 bpm. -CHA2DS2-VASc Score = 3 [CHF History: 1, HTN History: 1, Diabetes History: 0, Stroke History: 0, Vascular Disease History: 0, Age Score: 1, Gender Score: 0].  Therefore, the patient's annual risk of stroke is 3.2 %.      3. Essential  hypertension: -Patient's blood pressure today was controlled today at 112/54 -Continue losartan potassium 50 mg daily and Norvasc 10 mg daily  4.  Hyperlipidemia: -Patient's last LDL was 57 -Continue Crestor 40 mg daily  5.  Tobacco abuse: -Today patient reports continued marijuana and tobacco abuse -Smoking cessation advised     Disposition: Follow-up with Kristeen Miss, MD or APP in 6 months   Medication Adjustments/Labs and Tests Ordered: Current medicines are reviewed at length with the patient today.  Concerns regarding medicines are outlined above.   Signed, Napoleon Form, Leodis Rains, NP 05/11/2023, 2:08 PM [facility]

## 2023-05-11 ENCOUNTER — Encounter: Payer: Self-pay | Admitting: Nurse Practitioner

## 2023-05-11 ENCOUNTER — Ambulatory Visit: Payer: Medicare HMO | Admitting: Nurse Practitioner

## 2023-05-11 VITALS — BP 112/54 | HR 43 | Ht 65.5 in | Wt 160.0 lb

## 2023-05-11 DIAGNOSIS — F17200 Nicotine dependence, unspecified, uncomplicated: Secondary | ICD-10-CM

## 2023-05-11 DIAGNOSIS — I10 Essential (primary) hypertension: Secondary | ICD-10-CM | POA: Diagnosis not present

## 2023-05-11 DIAGNOSIS — I25119 Atherosclerotic heart disease of native coronary artery with unspecified angina pectoris: Secondary | ICD-10-CM

## 2023-05-11 DIAGNOSIS — E7800 Pure hypercholesterolemia, unspecified: Secondary | ICD-10-CM | POA: Diagnosis not present

## 2023-05-11 DIAGNOSIS — I4821 Permanent atrial fibrillation: Secondary | ICD-10-CM

## 2023-05-11 NOTE — Patient Instructions (Signed)
Medication Instructions:  Your physician recommends that you continue on your current medications as directed. Please refer to the Current Medication list given to you today. *If you need a refill on your cardiac medications before your next appointment, please call your pharmacy*   Lab Work: None ordered   Testing/Procedures: None ordered   Follow-Up: At [facility], you and your health needs are our priority.  As part of our continuing mission to provide you with exceptional heart care, we have created designated Provider Care Teams.  These Care Teams include your primary Cardiologist (physician) and Advanced Practice Providers (APPs -  Physician Assistants and Nurse Practitioners) who all work together to provide you with the care you need, when you need it.  We recommend signing up for the patient portal called "MyChart".  Sign up information is provided on this After Visit Summary.  MyChart is used to connect with patients for Virtual Visits (Telemedicine).  Patients are able to view lab/test results, encounter notes, upcoming appointments, etc.  Non-urgent messages can be sent to your provider as well.   To learn more about what you can do with MyChart, go to [email or web address].    Your next appointment:   6 month(s)  Provider:   Kristeen Miss, MD     Other Instructions Please exercise 30 minutes a day or 150 minutes a week.

## 2023-05-15 ENCOUNTER — Ambulatory Visit: Payer: Medicare HMO | Attending: Cardiology

## 2023-05-15 DIAGNOSIS — I4891 Unspecified atrial fibrillation: Secondary | ICD-10-CM | POA: Diagnosis not present

## 2023-05-15 DIAGNOSIS — Z5181 Encounter for therapeutic drug level monitoring: Secondary | ICD-10-CM | POA: Diagnosis not present

## 2023-05-15 LAB — POCT INR: INR: 2.3 (ref 2.0–3.0)

## 2023-05-15 NOTE — Patient Instructions (Signed)
continue 1 tablet daily except 1.5 tablets on Fridays. Recheck INR in 6 weeks. Coumadin Clinic [phone] or [phone]

## 2023-05-25 ENCOUNTER — Other Ambulatory Visit: Payer: Self-pay | Admitting: Family Medicine

## 2023-06-26 ENCOUNTER — Ambulatory Visit: Payer: Medicare HMO | Attending: Cardiology

## 2023-06-26 DIAGNOSIS — Z5181 Encounter for therapeutic drug level monitoring: Secondary | ICD-10-CM | POA: Diagnosis not present

## 2023-06-26 DIAGNOSIS — I4891 Unspecified atrial fibrillation: Secondary | ICD-10-CM

## 2023-06-26 LAB — POCT INR: INR: 1.9 — AB (ref 2.0–3.0)

## 2023-06-26 NOTE — Patient Instructions (Signed)
TAKE 1.5 TABLETS TODAY ONLY THEN continue 1 tablet daily except 1.5 tablets on Fridays. Recheck INR in 4 weeks. [facility]in Clinic [phone] or [phone]

## 2023-06-28 ENCOUNTER — Other Ambulatory Visit: Payer: Self-pay | Admitting: Nurse Practitioner

## 2023-06-28 DIAGNOSIS — K7402 Hepatic fibrosis, advanced fibrosis: Secondary | ICD-10-CM

## 2023-06-28 DIAGNOSIS — K760 Fatty (change of) liver, not elsewhere classified: Secondary | ICD-10-CM

## 2023-07-12 ENCOUNTER — Ambulatory Visit: Payer: Medicare HMO | Admitting: Family Medicine

## 2023-07-24 ENCOUNTER — Ambulatory Visit: Payer: Medicare HMO | Attending: Cardiology | Admitting: *Deleted

## 2023-07-24 DIAGNOSIS — I4821 Permanent atrial fibrillation: Secondary | ICD-10-CM

## 2023-07-24 DIAGNOSIS — Z5181 Encounter for therapeutic drug level monitoring: Secondary | ICD-10-CM | POA: Diagnosis not present

## 2023-07-24 LAB — POCT INR: INR: 2.2 (ref 2.0–3.0)

## 2023-07-24 NOTE — Patient Instructions (Signed)
Description   Continue taking warfarin 1 tablet daily except 1.5 tablets on Fridays. Recheck INR in 5 weeks. [facility]madin Clinic [phone] or [phone]

## 2023-08-23 ENCOUNTER — Ambulatory Visit (INDEPENDENT_AMBULATORY_CARE_PROVIDER_SITE_OTHER): Payer: Medicare HMO | Admitting: Family Medicine

## 2023-08-23 VITALS — BP 137/85 | HR 53 | Ht 65.0 in | Wt 162.2 lb

## 2023-08-23 DIAGNOSIS — R351 Nocturia: Secondary | ICD-10-CM

## 2023-08-23 DIAGNOSIS — I4821 Permanent atrial fibrillation: Secondary | ICD-10-CM

## 2023-08-23 DIAGNOSIS — N401 Enlarged prostate with lower urinary tract symptoms: Secondary | ICD-10-CM

## 2023-08-23 DIAGNOSIS — E7800 Pure hypercholesterolemia, unspecified: Secondary | ICD-10-CM

## 2023-08-23 DIAGNOSIS — I10 Essential (primary) hypertension: Secondary | ICD-10-CM

## 2023-08-23 DIAGNOSIS — Z0000 Encounter for general adult medical examination without abnormal findings: Secondary | ICD-10-CM

## 2023-08-23 DIAGNOSIS — Z23 Encounter for immunization: Secondary | ICD-10-CM

## 2023-08-23 DIAGNOSIS — K219 Gastro-esophageal reflux disease without esophagitis: Secondary | ICD-10-CM

## 2023-08-23 DIAGNOSIS — D72819 Decreased white blood cell count, unspecified: Secondary | ICD-10-CM

## 2023-08-23 DIAGNOSIS — D509 Iron deficiency anemia, unspecified: Secondary | ICD-10-CM

## 2023-08-23 DIAGNOSIS — M1A09X0 Idiopathic chronic gout, multiple sites, without tophus (tophi): Secondary | ICD-10-CM

## 2023-08-23 NOTE — Progress Notes (Signed)
  Date of Visit: 08/23/2023   SUBJECTIVE:   HPI:  Jesse Franklin presents today for follow-up.  Hypertension: Currently taking amlodipine 10 mg daily and losartan 50 mg daily.   Muscle aches: Takes baclofen 2.5 mg daily as needed, works well for him  Gout: Taking colchicine 0.6 mg twice a day, follows with rheum for gout  GERD: Taking famotidine 20 mg twice a day and Protonix 40 mg daily. Notes having more acid reflux symptoms lately despite these medications   Hyperlipidemia: Currently taking rosuvastatin 40 mg daily. Tolerating well  BPH: Currently taking Flomax 0.4 mg daily. Works very well for his prostate.  A-fib: Remains on Coumadin which is managed by cardiology   OBJECTIVE:   BP 137/85   Pulse (!) 53   Ht 5\' 5"  (1.651 m)   Wt 162 lb 3.2 oz (73.6 kg)   SpO2 100%   BMI 26.99 kg/m  Gen: no acute distress, pleasant cooperative HEENT: normocephalic, atraumatic  Heart: regular rate and rhythm no murmur Lungs: clear to auscultation bilaterally normal work of breathing  Neuro: alert speech normal grossly nonfocal Ext: No appreciable lower extremity edema bilaterally  Abdomen: soft nontender to palpation no masses or organomegaly  ASSESSMENT/PLAN:   Assessment & Plan Pure hypercholesterolemia Update lipids today Continue statin Essential hypertension, benign Well controlled. Continue current medication regimen.  Updating renal function today Leukopenia, unspecified type Update CBC today Gastroesophageal reflux disease, unspecified whether esophagitis present Worsened symptoms despite PPI and h2 blocker Recommend seeing GI, referral placed, may need EGD Chronic gout of multiple sites, unspecified cause Follows with rheum for gout Iron deficiency anemia, unspecified iron deficiency anemia type Updating CBC today Nearly due for next colonoscopy, referred back to GI Permanent atrial fibrillation (HCC) Doing well, follows with cardiology for coumadin Benign prostatic  hyperplasia with nocturia Symptoms well controlled with flomax, continue Routine adult health maintenance Declines COVID vaccine today Flu shot given today Ordered FIT testing, also referring back to GI as nearly due for next colonoscopy Declines shingrix    FOLLOW UP: Follow up in 6 mos for above issues  Jesse J. Pollie Meyer, MD [facility]

## 2023-08-23 NOTE — Patient Instructions (Addendum)
It was great to see you again today.  Flu shot today Checking labs Referring to GI doctor for your reflux  Follow up with me in 6 months, sooner if needed  Be well, Dr. Pollie Meyer

## 2023-08-24 LAB — LIPID PANEL
Chol/HDL Ratio: 2.5 {ratio} (ref 0.0–5.0)
Cholesterol, Total: 120 mg/dL (ref 100–199)
HDL: 48 mg/dL (ref >39)
LDL Chol Calc (NIH): 56 mg/dL (ref 0–99)
Triglycerides: 83 mg/dL (ref 0–149)
VLDL Cholesterol Cal: 16 mg/dL (ref 5–40)

## 2023-08-24 LAB — BASIC METABOLIC PANEL
BUN/Creatinine Ratio: 11 (ref 10–24)
BUN: 15 mg/dL (ref 8–27)
CO2: 21 mmol/L (ref 20–29)
Calcium: 9.7 mg/dL (ref 8.6–10.2)
Chloride: 106 mmol/L (ref 96–106)
Creatinine, Ser: 1.34 mg/dL — ABNORMAL HIGH (ref 0.76–1.27)
Glucose: 86 mg/dL (ref 70–99)
Potassium: 4.2 mmol/L (ref 3.5–5.2)
Sodium: 143 mmol/L (ref 134–144)
eGFR: 57 mL/min/{1.73_m2} — ABNORMAL LOW (ref >59)

## 2023-08-24 LAB — CBC
Hematocrit: 36.9 % — ABNORMAL LOW (ref 37.5–51.0)
Hemoglobin: 12.2 g/dL — ABNORMAL LOW (ref 13.0–17.7)
MCH: 32.2 pg (ref 26.6–33.0)
MCHC: 33.1 g/dL (ref 31.5–35.7)
MCV: 97 fL (ref 79–97)
Platelets: 150 10*3/uL (ref 150–450)
RBC: 3.79 x10E6/uL — ABNORMAL LOW (ref 4.14–5.80)
RDW: 14.5 % (ref 11.6–15.4)
WBC: 3.9 10*3/uL (ref 3.4–10.8)

## 2023-08-25 DIAGNOSIS — Z0000 Encounter for general adult medical examination without abnormal findings: Secondary | ICD-10-CM | POA: Insufficient documentation

## 2023-08-25 NOTE — Assessment & Plan Note (Signed)
Updating CBC today Nearly due for next colonoscopy, referred back to GI

## 2023-08-25 NOTE — Assessment & Plan Note (Signed)
Doing well, follows with cardiology for coumadin

## 2023-08-25 NOTE — Assessment & Plan Note (Signed)
Worsened symptoms despite PPI and h2 blocker Recommend seeing GI, referral placed, may need EGD

## 2023-08-25 NOTE — Assessment & Plan Note (Signed)
Declines COVID vaccine today Flu shot given today Ordered FIT testing, also referring back to GI as nearly due for next colonoscopy Declines shingrix

## 2023-08-25 NOTE — Assessment & Plan Note (Signed)
Symptoms well controlled with flomax, continue

## 2023-08-25 NOTE — Assessment & Plan Note (Signed)
Update lipids today.  Continue statin. 

## 2023-08-25 NOTE — Assessment & Plan Note (Signed)
Well controlled. Continue current medication regimen.  Updating renal function today

## 2023-08-25 NOTE — Assessment & Plan Note (Signed)
Follows with rheum for gout

## 2023-08-28 ENCOUNTER — Ambulatory Visit: Payer: Medicare HMO | Attending: Cardiovascular Disease

## 2023-08-28 DIAGNOSIS — Z5181 Encounter for therapeutic drug level monitoring: Secondary | ICD-10-CM

## 2023-08-28 DIAGNOSIS — I4821 Permanent atrial fibrillation: Secondary | ICD-10-CM

## 2023-08-28 LAB — POCT INR: INR: 2.3 (ref 2.0–3.0)

## 2023-08-28 NOTE — Patient Instructions (Signed)
Continue taking warfarin 1 tablet daily except 1.5 tablets on Fridays. Recheck INR in 6 weeks. Coumadin Clinic [phone] or [phone]

## 2023-08-30 ENCOUNTER — Other Ambulatory Visit: Payer: Self-pay | Admitting: Family Medicine

## 2023-08-31 ENCOUNTER — Encounter: Payer: Self-pay | Admitting: Family Medicine

## 2023-09-19 ENCOUNTER — Ambulatory Visit
Admission: RE | Admit: 2023-09-19 | Discharge: 2023-09-19 | Disposition: A | Discharge status: Home / Self Care | Payer: 59 | Source: Ambulatory Visit | Attending: Nurse Practitioner | Admitting: Nurse Practitioner

## 2023-09-19 DIAGNOSIS — F1090 Alcohol use, unspecified, uncomplicated: Secondary | ICD-10-CM

## 2023-09-19 DIAGNOSIS — K7402 Hepatic fibrosis, advanced fibrosis: Secondary | ICD-10-CM

## 2023-09-26 ENCOUNTER — Other Ambulatory Visit: Payer: Self-pay | Admitting: Cardiovascular Disease

## 2023-09-26 DIAGNOSIS — I4891 Unspecified atrial fibrillation: Secondary | ICD-10-CM

## 2023-09-28 ENCOUNTER — Ambulatory Visit (HOSPITAL_COMMUNITY)
Admission: EM | Admit: 2023-09-28 | Discharge: 2023-09-28 | Disposition: A | Discharge status: Home / Self Care | Payer: 59 | Attending: Internal Medicine | Admitting: Internal Medicine

## 2023-09-28 ENCOUNTER — Encounter (HOSPITAL_COMMUNITY): Payer: Self-pay

## 2023-09-28 DIAGNOSIS — S61319A Laceration without foreign body of unspecified finger with damage to nail, initial encounter: Secondary | ICD-10-CM | POA: Diagnosis not present

## 2023-09-28 DIAGNOSIS — Z23 Encounter for immunization: Secondary | ICD-10-CM

## 2023-09-28 MED ORDER — TETANUS-DIPHTH-ACELL PERTUSSIS 5-2.5-18.5 LF-MCG/0.5 IM SUSY
PREFILLED_SYRINGE | INTRAMUSCULAR | Status: AC
  Filled 2023-09-28: qty 0.5

## 2023-09-28 MED ORDER — TETANUS-DIPHTH-ACELL PERTUSSIS 5-2.5-18.5 LF-MCG/0.5 IM SUSY
0.5000 mL | PREFILLED_SYRINGE | Freq: Once | INTRAMUSCULAR | Status: AC
  Administered 2023-09-28: 0.5 mL via INTRAMUSCULAR

## 2023-09-28 MED ORDER — SILVER NITRATE-POT NITRATE 75-25 % EX MISC
CUTANEOUS | Status: AC
  Filled 2023-09-28: qty 10

## 2023-09-28 MED ORDER — DOXYCYCLINE HYCLATE 100 MG PO CAPS: 100.0000 mg | ORAL_CAPSULE | Freq: Two times a day (BID) | ORAL | 0 refills | Status: AC

## 2023-09-28 MED ORDER — MUPIROCIN 2 % EX OINT: 1.0000 | TOPICAL_OINTMENT | Freq: Two times a day (BID) | CUTANEOUS | 0 refills | Status: DC

## 2023-09-28 NOTE — ED Triage Notes (Signed)
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 Patient was oozing. Area cleaned and a dry dressing applied.  [facility] provider notified. Patient is currently on coumadin.

## 2023-09-28 NOTE — ED Provider Notes (Signed)
[facility]    CSN: [id number] Arrival date & time: 09/28/23  1339      History   Chief Complaint Chief Complaint  Patient presents with   Finger Injury   Extremity Laceration    HPI Jesse Franklin is a 73 y.o. male.   73 y.o. male who presents to urgent care with complaints of laceration to the left middle finger.  This occurred at work around 1:00.  He was cutting tomatoes and sliced his nailbed.  He is on Coumadin and it bled significantly.  He originally was not going to come for evaluation but his employer asked that he be evaluated.  He does not remember the last time he had a tetanus and thinks that it was most likely well more than 5 years ago.     Past Medical History:  Diagnosis Date   2015    Alcohol abuse    Arthritis    Atrial fibrillation (HCC)    BPH 11/08/2006   CAD (coronary artery disease)    Branch Vessel   Chronic kidney disease    Cocaine abuse (HCC)    Hx of   Depression    GERD (gastroesophageal reflux disease)    Gout    Hallucination    "smoke marijuana to prevent hallucinations", started in 1999, last one 11/2021   Herpes    Herpes genitalia 04/13/2011   History of kidney stones    Hyperlipidemia    Hypertension    Myocardial infarction (HCC) 2005   RHINITIS, ALLERGIC 11/08/2006   Tobacco abuse     Patient Active Problem List   Diagnosis Date Noted   Routine adult health maintenance 08/25/2023   Boil of inguinal region 01/30/2023   Phimosis of penis 01/25/2023   Lumbosacral strain 08/07/2022   Left arm pain 04/17/2022   Numbness and tingling in right hand 01/30/2017   Proteinuria 11/19/2015   Stage I pressure ulcer of sacral region 11/12/2014   Elevated liver enzymes 11/12/2014   Acute gouty arthritis    Malnutrition of moderate degree (HCC) 11/05/2014   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   Bilateral knee pain 11/03/2014   Abdominal pain, lower    Supratherapeutic INR    Baker's cyst of knee 10/06/2014   Anginal  chest pain at rest [facility]) 07/31/2014   Pulmonary hypertension (HCC) 12/02/2013   Encounter for therapeutic drug monitoring 11/20/2013   Hx of long term use of blood thinners 01/11/2013   Olecranon bursitis of left elbow 03/18/2012   Erectile dysfunction 08/25/2011   Knee pain, bilateral 05/04/2011   Herpes genitalia 04/13/2011   Anemia, iron deficiency 05/31/2010   Gout 04/14/2009   HYPERTENSION, BENIGN ESSENTIAL 12/11/2006   HYPERCHOLESTEROLEMIA 11/08/2006   Schizophrenia (HCC) 11/08/2006   Major depressive disorder, recurrent episode (HCC) 11/08/2006   TOBACCO DEPENDENCE 11/08/2006   CAD (coronary artery disease) 11/08/2006   Atrial fibrillation (HCC) 11/08/2006   GASTROESOPHAGEAL REFLUX, NO ESOPHAGITIS 11/08/2006   BPH (benign prostatic hyperplasia) 11/08/2006    Past Surgical History:  Procedure Laterality Date   CARDIAC CATHETERIZATION  08/11/2004   CARPAL TUNNEL RELEASE Right 04/04/2017   Procedure: RIGHT CARPAL TUNNEL RELEASE;  Surgeon: Tarry Kos, MD;  Location: [facility];  Service: Orthopedics;  Laterality: Right;   CYSTOSCOPY W/ RETROGRADES Bilateral 01/16/2022   Procedure: CYSTOSCOPY WITH RETROGRADE PYELOGRAM;  Surgeon: Crista Elliot, MD;  Location: [facility];  Service: Urology;  Laterality: Bilateral;   CYSTOSCOPY W/ URETERAL STENT REMOVAL Right 02/13/2022  Procedure: CYSTOSCOPY WITH RIGHT STENT REMOVAL;  Surgeon: Crista Elliot, MD;  Location: [facility];  Service: Urology;  Laterality: Right;  30 MINS FOR THIS CASE   LACERATION REPAIR Right 03/11/1988   "hand"   URETEROSCOPY Right 01/16/2022   Procedure: URETEROSCOPY WITH LASER LITHOTRIPSY STONE BASKET EXTRACTION, STENT PLACEMENT;  Surgeon: Crista Elliot, MD;  Location: [facility];  Service: Urology;  Laterality: Right;       Home Medications    Prior to Admission medications   Medication Sig Start Date End Date Taking? Authorizing Provider  allopurinol (ZYLOPRIM) 100 MG tablet Take 100 mg  by mouth 2 (two) times daily.   Yes [provider]  amLODipine (NORVASC) 10 MG tablet TAKE 1 TABLET BY MOUTH EVERY DAY 03/20/23  Yes Latrelle Dodrill, MD  Baclofen 5 MG TABS TAKE 1/2 TABLET BY MOUTH DAILY AS NEEDED 04/11/23  Yes Latrelle Dodrill, MD  clopidogrel (PLAVIX) 75 MG tablet TAKE 1 TABLET BY MOUTH EVERY DAY 04/11/23  Yes Nahser, Deloris Ping, MD  colchicine 0.6 MG tablet TAKE 1 TABLET BY MOUTH 2 TIMES DAILY. 03/21/22  Yes Latrelle Dodrill, MD  doxycycline (VIBRAMYCIN) 100 MG capsule Take 1 capsule (100 mg total) by mouth 2 (two) times daily for 3 days. 09/28/23 10/01/23 Yes Maleni Seyer A, PA-C  famotidine (PEPCID) 20 MG tablet TAKE 1 TABLET BY MOUTH TWICE A DAY 08/30/23  Yes Latrelle Dodrill, MD  losartan (COZAAR) 50 MG tablet Take 50 mg by mouth daily. 04/07/23  Yes [provider]  mupirocin ointment (BACTROBAN) 2 % Apply 1 Application topically 2 (two) times daily. Apply to the left middle finger wound twice daily and cover with a dry dressing for 7 days 09/28/23  Yes Fritzi Scripter A, PA-C  pantoprazole (PROTONIX) 40 MG tablet TAKE 1 TABLET BY MOUTH EVERY DAY 08/30/23  Yes Latrelle Dodrill, MD  rosuvastatin (CRESTOR) 40 MG tablet TAKE 1 TABLET BY MOUTH EVERY DAY 05/28/23  Yes Latrelle Dodrill, MD  tamsulosin (FLOMAX) 0.4 MG CAPS capsule TAKE 1 CAPSULE BY MOUTH EVERY DAY 03/20/23  Yes Latrelle Dodrill, MD  warfarin (COUMADIN) 5 MG tablet TAKE 1 TO 1 AND 1/2 TABLETS BY MOUTH ONCE DAILY OR AS DIRECTED BY COUMADIN CLINIC. 09/26/23  Yes Nahser, Deloris Ping, MD  nitroGLYCERIN (NITROSTAT) 0.4 MG SL tablet Place 1 tablet (0.4 mg total) under the tongue as directed. Place 1 tab under tongue as directed 02/20/12   Nahser, Deloris Ping, MD    Family History Family History  Problem Relation Age of Onset   Alcohol abuse Father    Heart disease Brother    Alcohol abuse Brother    Cancer Sister    Hypertension Mother    Gout Mother    Arthritis Brother     Hypertension Sister     Social History Social History   Tobacco Use   Smoking status: Every Day    Current packs/day: 0.25    Average packs/day: 0.3 packs/day for 48.0 years (12.0 ttl pk-yrs)    Types: Cigarettes   Smokeless tobacco: Former    Types: Chew    Quit date: 09/12/1967   Tobacco comments:    smoking 4-5 cigs/day  Vaping Use   Vaping status: Never Used  Substance Use Topics   Alcohol use: Not Currently    Comment: last use per pt- 10 years ago   Drug use: Yes    Frequency: 3.0 times per week    Types:  Marijuana    Comment: hx of cocaine use- 2005, marijuana- daily     Allergies   Codeine and Ketorolac tromethamine   Review of Systems Review of Systems  Constitutional:  Negative for chills and fever.  HENT:  Negative for ear pain and sore throat.   Eyes:  Negative for pain and visual disturbance.  Respiratory:  Negative for cough and shortness of breath.   Cardiovascular:  Negative for chest pain and palpitations.  Gastrointestinal:  Negative for abdominal pain and vomiting.  Genitourinary:  Negative for dysuria and hematuria.  Musculoskeletal:  Negative for arthralgias and back pain.  Skin:  Positive for wound (left middle finger). Negative for color change and rash.  Neurological:  Negative for seizures and syncope.  All other systems reviewed and are negative.    Physical Exam Triage Vital Signs ED Triage Vitals  Encounter Vitals Group     BP 09/28/23 1531 116/70     Systolic BP Percentile --      Diastolic BP Percentile --      Pulse Rate 09/28/23 1531 (!) 54     Resp 09/28/23 1531 16     Temp 09/28/23 1531 98 F (36.7 C)     Temp Source 09/28/23 1531 Oral     SpO2 09/28/23 1531 97 %     Weight 09/28/23 1530 158 lb (71.7 kg)     Height 09/28/23 1530 5' 5.5" (1.664 m)     Head Circumference --      Peak Flow --      Pain Score 09/28/23 1528 0     Pain Loc --      Pain Education --      Exclude from Growth Chart --    No data  found.  Updated Vital Signs BP 116/70 (BP Location: Right Arm)   Pulse (!) 54   Temp 98 F (36.7 C) (Oral)   Resp 16   Ht 5' 5.5" (1.664 m)   Wt 158 lb (71.7 kg)   SpO2 97%   BMI 25.89 kg/m   Visual Acuity Right Eye Distance:   Left Eye Distance:   Bilateral Distance:    Right Eye Near:   Left Eye Near:    Bilateral Near:     Physical Exam Vitals and nursing note reviewed.  Constitutional:      General: He is not in acute distress.    Appearance: He is well-developed.  HENT:     Head: Normocephalic and atraumatic.  Eyes:     Conjunctiva/sclera: Conjunctivae normal.  Cardiovascular:     Rate and Rhythm: Normal rate and regular rhythm.     Heart sounds: No murmur heard. Pulmonary:     Effort: Pulmonary effort is normal. No respiratory distress.     Breath sounds: Normal breath sounds.  Abdominal:     Palpations: Abdomen is soft.     Tenderness: There is no abdominal tenderness.  Musculoskeletal:        General: No swelling.     Cervical back: Neck supple.  Skin:    General: Skin is warm and dry.     Capillary Refill: Capillary refill takes less than 2 seconds.     Comments: Left middle finger nail is degloved. Silver nitrate applied to nail bed. No suturing needed.  Neurological:     Mental Status: He is alert.  Psychiatric:        Mood and Affect: Mood normal.      UC Treatments / Results  Labs (all labs ordered are listed, but only abnormal results are displayed) Labs Reviewed - No data to display  EKG   Radiology No results found.  Procedures Procedures (including critical care time)  Medications Ordered in UC Medications  Tdap (BOOSTRIX) injection 0.5 mL (has no administration in time range)    Initial Impression / Assessment and Plan / UC Course  I have reviewed the triage vital signs and the nursing notes.  Pertinent labs & imaging results that were available during my care of the patient were reviewed by me and considered in my  medical decision making (see chart for details).     Laceration of nail bed of finger, initial encounter   Laceration to the left middle finger nail bed. No sutures are needed. We applied silver nitrate to help with the bleeding. Leave the dressing in place that we have placed today. We will treat with the following:   Mupirocin ointment Apply to the left middle finger wound at least twice daily and cover with a dry dressing for 7 days Ok to shower and wash the hand with soap and water but avoid submerging it until it is healed Take Doxycycline 100 mg twice daily for 3 days. This is to prevent infection. We have up dated your tetanus shot today Return to urgent care or PCP if symptoms worsen or fail to resolve.    Final Clinical Impressions(s) / UC Diagnoses   Final diagnoses:  Laceration of nail bed of finger, initial encounter     Discharge Instructions      Laceration to the left middle finger nail bed. No sutures are needed. We applied silver nitrate to help with the bleeding. Leave the dressing in place that we have placed today. We will treat with the following:   Mupirocin ointment Apply to the left middle finger wound at least twice daily and cover with a dry dressing for 7 days Ok to shower and wash the hand with soap and water but avoid submerging it until it is healed Take Doxycycline 100 mg twice daily for 3 days. This is to prevent infection. We have up dated your tetanus shot today Return to urgent care or PCP if symptoms worsen or fail to resolve.     ED Prescriptions     Medication Sig Dispense Auth. Provider   mupirocin ointment (BACTROBAN) 2 % Apply 1 Application topically 2 (two) times daily. Apply to the left middle finger wound twice daily and cover with a dry dressing for 7 days 22 g Kaysee Hergert A, PA-C   doxycycline (VIBRAMYCIN) 100 MG capsule Take 1 capsule (100 mg total) by mouth 2 (two) times daily for 3 days. 6 capsule Landis Martins, [place]       PDMP not reviewed this encounter.   Landis Martins, [place] 09/28/23 1611

## 2023-09-28 NOTE — Discharge Instructions (Addendum)
Laceration to the left middle finger nail bed. No sutures are needed. We applied silver nitrate to help with the bleeding. Leave the dressing in place that we have placed today. We will treat with the following:   Mupirocin ointment Apply to the left middle finger wound at least twice daily and cover with a dry dressing for 7 days Ok to shower and wash the hand with soap and water but avoid submerging it until it is healed Take Doxycycline 100 mg twice daily for 3 days. This is to prevent infection. We have up dated your tetanus shot today Return to urgent care or PCP if symptoms worsen or fail to resolve.

## 2023-09-28 NOTE — ED Triage Notes (Signed)
Patient states his last TDAP was probably over 5 years ago. Laceration onset today.

## 2023-10-09 ENCOUNTER — Ambulatory Visit: Payer: 59 | Attending: Cardiology | Admitting: *Deleted

## 2023-10-09 DIAGNOSIS — I4821 Permanent atrial fibrillation: Secondary | ICD-10-CM | POA: Diagnosis not present

## 2023-10-09 DIAGNOSIS — Z5181 Encounter for therapeutic drug level monitoring: Secondary | ICD-10-CM

## 2023-10-09 LAB — POCT INR: INR: 2.2 (ref 2.0–3.0)

## 2023-10-09 NOTE — Patient Instructions (Signed)
Description   Continue taking warfarin 1 tablet daily except 1.5 tablets on Fridays. Recheck INR in 6 weeks. [facility] [phone] or [phone]

## 2023-10-22 ENCOUNTER — Other Ambulatory Visit: Payer: Self-pay | Admitting: Family Medicine

## 2023-11-08 ENCOUNTER — Ambulatory Visit: Payer: 59

## 2023-11-08 VITALS — Ht 65.0 in | Wt 159.0 lb

## 2023-11-08 DIAGNOSIS — Z0000 Encounter for general adult medical examination without abnormal findings: Secondary | ICD-10-CM

## 2023-11-08 NOTE — Progress Notes (Signed)
 Subjective:   Jesse Franklin is a 73 y.o. who presents for a Medicare Wellness preventive visit.  Visit Complete: Virtual I connected with  Jesse Franklin on 11/08/23 by a audio enabled telemedicine application and verified that I am speaking with the correct person using two identifiers.  Patient Location: Other:  car  Provider Location: Office/Clinic  I discussed the limitations of evaluation and management by telemedicine. The patient expressed understanding and agreed to proceed.  Vital Signs: Because this visit was a virtual/telehealth visit, some criteria may be missing or patient reported. Any vitals not documented were not able to be obtained and vitals that have been documented are patient reported.  VideoDeclined- This patient declined Librarian, academic. Therefore the visit was completed with audio only.  AWV Questionnaire: No: Patient Medicare AWV questionnaire was not completed prior to this visit.  Cardiac Risk Factors include: advanced age (>34men, >89 women);dyslipidemia;family history of premature cardiovascular disease;hypertension;male gender;smoking/ tobacco exposure     Objective:    Today's Vitals   11/08/23 1007  Weight: 159 lb (72.1 kg)  Height: 5\' 5"  (1.651 m)  PainSc: 0-No pain   Body mass index is 26.46 kg/m.     11/08/2023   10:10 AM 01/30/2023    9:07 AM 01/25/2023    1:17 PM 09/08/2022    1:39 PM 08/07/2022   10:31 AM 02/10/2022    9:38 AM 01/16/2022    6:03 AM  Advanced Directives  Does Patient Have a Medical Advance Directive? No No No No No No No  Would patient like information on creating a medical advance directive? No - Patient declined   No - Patient declined No - Patient declined  No - Patient declined    Current Medications (verified) Outpatient Encounter Medications as of 11/08/2023  Medication Sig   allopurinol (ZYLOPRIM) 100 MG tablet Take 100 mg by mouth 2 (two) times daily.   amLODipine (NORVASC) 10  MG tablet TAKE 1 TABLET BY MOUTH EVERY DAY   Baclofen 5 MG TABS TAKE 1/2 TABLET BY MOUTH DAILY AS NEEDED   clopidogrel (PLAVIX) 75 MG tablet TAKE 1 TABLET BY MOUTH EVERY DAY   colchicine 0.6 MG tablet TAKE 1 TABLET BY MOUTH 2 TIMES DAILY.   famotidine (PEPCID) 20 MG tablet TAKE 1 TABLET BY MOUTH TWICE A DAY   losartan (COZAAR) 50 MG tablet Take 50 mg by mouth daily.   mupirocin ointment (BACTROBAN) 2 % Apply 1 Application topically 2 (two) times daily. Apply to the left middle finger wound twice daily and cover with a dry dressing for 7 days   nitroGLYCERIN (NITROSTAT) 0.4 MG SL tablet Place 1 tablet (0.4 mg total) under the tongue as directed. Place 1 tab under tongue as directed   pantoprazole (PROTONIX) 40 MG tablet TAKE 1 TABLET BY MOUTH EVERY DAY   rosuvastatin (CRESTOR) 40 MG tablet TAKE 1 TABLET BY MOUTH EVERY DAY   tamsulosin (FLOMAX) 0.4 MG CAPS capsule TAKE 1 CAPSULE BY MOUTH EVERY DAY   warfarin (COUMADIN) 5 MG tablet TAKE 1 TO 1 AND 1/2 TABLETS BY MOUTH ONCE DAILY OR AS DIRECTED BY COUMADIN CLINIC.   No facility-administered encounter medications on file as of 11/08/2023.    Allergies (verified) Codeine and Ketorolac tromethamine   History: Past Medical History:  Diagnosis Date   2015    Alcohol abuse    Arthritis    Atrial fibrillation (HCC)    BPH 11/08/2006   CAD (coronary artery disease)  Branch Vessel   Chronic kidney disease    Cocaine abuse (HCC)    Hx of   Depression    GERD (gastroesophageal reflux disease)    Gout    Hallucination    "smoke marijuana to prevent hallucinations", started in 1999, last one 11/2021   Herpes    Herpes genitalia 04/13/2011   History of kidney stones    Hyperlipidemia    Hypertension    Myocardial infarction (HCC) 2005   RHINITIS, ALLERGIC 11/08/2006   Tobacco abuse    Past Surgical History:  Procedure Laterality Date   CARDIAC CATHETERIZATION  08/11/2004   CARPAL TUNNEL RELEASE Right 04/04/2017   Procedure: RIGHT  CARPAL TUNNEL RELEASE;  Surgeon: Tarry Kos, MD;  Location: [facility];  Service: Orthopedics;  Laterality: Right;   CYSTOSCOPY W/ RETROGRADES Bilateral 01/16/2022   Procedure: CYSTOSCOPY WITH RETROGRADE PYELOGRAM;  Surgeon: Crista Elliot, MD;  Location: [facility];  Service: Urology;  Laterality: Bilateral;   CYSTOSCOPY W/ URETERAL STENT REMOVAL Right 02/13/2022   Procedure: CYSTOSCOPY WITH RIGHT STENT REMOVAL;  Surgeon: Crista Elliot, MD;  Location: [facility];  Service: Urology;  Laterality: Right;  30 MINS FOR THIS CASE   LACERATION REPAIR Right 03/11/1988   "hand"   URETEROSCOPY Right 01/16/2022   Procedure: URETEROSCOPY WITH LASER LITHOTRIPSY STONE BASKET EXTRACTION, STENT PLACEMENT;  Surgeon: Crista Elliot, MD;  Location: [facility];  Service: Urology;  Laterality: Right;   Family History  Problem Relation Age of Onset   Alcohol abuse Father    Heart disease Brother    Alcohol abuse Brother    Cancer Sister    Hypertension Mother    Gout Mother    Arthritis Brother    Hypertension Sister    Social History   Socioeconomic History   Marital status: Single    Spouse name: Not on file   Number of children: 0   Years of education: 8   Highest education level: 8th grade  Occupational History   Occupation: retired- Chartered certified accountant: UNEMPLOYED   Occupation: motel work  Tobacco Use   Smoking status: Every Day    Current packs/day: 0.25    Average packs/day: 0.3 packs/day for 48.0 years (12.0 ttl pk-yrs)    Types: Cigarettes   Smokeless tobacco: Former    Types: Chew    Quit date: 09/12/1967   Tobacco comments:    smoking 4-5 cigs/day  Vaping Use   Vaping status: Never Used  Substance and Sexual Activity   Alcohol use: Not Currently    Comment: last use per pt- 10 years ago   Drug use: Yes    Frequency: 3.0 times per week    Types: Marijuana    Comment: hx of cocaine use- 2005, marijuana- daily   Sexual activity: Yes    Birth  control/protection: Condom  Other Topics Concern   Not on file  Social History Narrative   Pt is illiterate, he needs assistance with medications and any instructions given   Who lives with you: self and dog, mini doberman named Grandma   Lives in apartment on ground floor. Apt has handrail on stair. Has smoke alarm in apartment. No throw rugs. No grab bars in bathroom.   Diet: Pt has a varied diet. Avoids red meat, due to gout. Eats chicken, veggies, fruit.    Exercise: Pt reports walking 20/30 minutes almost every day walking dog.   Seatbelts: Pt reports wearing seatbelt  when in vehicles.    Hobbies: walking, watching tv, go to park with dog. Avoids crowds.    Smokes marijuana "keeps me from hearing voices". No alcohol. Daily cigarette smoker. Does not want to quit.   Sexually active, condoms given for STI protection.         Social Drivers of Corporate investment banker Strain: Low Risk  (11/08/2023)   Overall Financial Resource Strain (CARDIA)    Difficulty of Paying Living Expenses: Not hard at all  Food Insecurity: No Food Insecurity (11/08/2023)   Hunger Vital Sign    Worried About Running Out of Food in the Last Year: Never true    Ran Out of Food in the Last Year: Never true  Transportation Needs: No Transportation Needs (11/08/2023)   PRAPARE - Administrator, Civil Service (Medical): No    Lack of Transportation (Non-Medical): No  Physical Activity: Sufficiently Active (11/08/2023)   Exercise Vital Sign    Days of Exercise per Week: 5 days    Minutes of Exercise per Session: 30 min  Stress: No Stress Concern Present (11/08/2023)   [employer] of Occupational Health - Occupational Stress Questionnaire    Feeling of Stress : Not at all  Social Connections: Socially Isolated (11/08/2023)   Social Connection and Isolation Panel [NHANES]    Frequency of Communication with Friends and Family: Once a week    Frequency of Social Gatherings with Friends and Family:  Once a week    Attends Religious Services: Never    Database administrator or Organizations: No    Attends Engineer, structural: Never    Marital Status: Divorced    Tobacco Counseling Ready to quit: Not Answered Counseling given: Not Answered Tobacco comments: smoking 4-5 cigs/day    Clinical Intake:  Pre-visit preparation completed: Yes  Pain : No/denies pain Pain Score: 0-No pain     BMI - recorded: 26.46 Nutritional Status: BMI 25 -29 Overweight Nutritional Risks: None Diabetes: No  How often do you need to have someone help you when you read instructions, pamphlets, or other written materials from your doctor or pharmacy?: 1 - Never What is the last grade level you completed in school?: HSG  Interpreter Needed?: No  Information entered by :: Jesse Brosky N. Flora Parks, LPN.   Activities of Daily Living     11/08/2023   10:12 AM  In your present state of health, do you have any difficulty performing the following activities:  Hearing? 0  Vision? 0  Difficulty concentrating or making decisions? 0  Walking or climbing stairs? 0  Dressing or bathing? 0  Doing errands, shopping? 0  Preparing Food and eating ? N  Using the Toilet? N  In the past six months, have you accidently leaked urine? N  Do you have problems with loss of bowel control? N  Managing your Medications? N  Managing your Finances? N  Housekeeping or managing your Housekeeping? N    Patient Care Team: Latrelle Dodrill, MD as PCP - General (Pediatrics) Nahser, Deloris Ping, MD as PCP - Cardiology (Cardiology)  Indicate any recent Medical Services you may have received from other than [facility] providers in the past year (date may be approximate).     Assessment:   This is a routine wellness examination for Jesse Franklin.  Hearing/Vision screen Hearing Screening - Comments:: Patient has hearing difficulties. No hearing aids.   Vision Screening - Comments:: Wears reading glasses - up to date with  routine  eye exams with [facility]    Goals Addressed             This Visit's Progress    Client understands the importance of follow-up with providers by attending scheduled visits       DIET - INCREASE WATER INTAKE         Depression Screen     11/08/2023   10:12 AM 08/23/2023    9:14 AM 02/07/2023   10:04 AM 01/30/2023    9:07 AM 01/25/2023    1:17 PM 09/08/2022    1:38 PM 08/07/2022   10:31 AM  PHQ 2/9 Scores  PHQ - 2 Score 0 1 1  3 2 3   PHQ- 9 Score 1 3 5  9  9   Exception Documentation    Patient refusal       Fall Risk     11/08/2023   10:10 AM 08/23/2023    9:14 AM 02/07/2023    9:58 AM 01/30/2023    9:07 AM 01/25/2023    1:17 PM  Fall Risk   Falls in the past year? 0 0 0 0 0  Number falls in past yr: 0 0 0 0 0  Injury with Fall? 0 0 0 0 0  Risk for fall due to : No Fall Risks      Follow up Falls prevention discussed;Falls evaluation completed  Falls evaluation completed      MEDICARE RISK AT HOME:  Medicare Risk at Home Any stairs in or around the home?: Yes [employer]) If so, are there any without handrails?: No Home free of loose throw rugs in walkways, pet beds, electrical cords, etc?: Yes Adequate lighting in your home to reduce risk of falls?: Yes Life alert?: No Use of a cane, walker or w/c?: No Grab bars in the bathroom?: No Shower chair or bench in shower?: No Elevated toilet seat or a handicapped toilet?: No  TIMED UP AND GO:  Was the test performed?  No  Cognitive Function: 6CIT completed    11/08/2023   10:12 AM 07/17/2018   10:26 AM 01/20/2014   10:00 AM 01/16/2013   10:00 AM  MMSE - Mini Mental State Exam  Not completed: Unable to complete     Orientation to time  5 5 5   Orientation to Place  5 5 5   Registration  3 3 3   Attention/ Calculation  5 0 0  Recall  3 2 3   Language- name 2 objects  2 2 2   Language- repeat  1 1 1   Language- follow 3 step command  3 3 3   Language- read & follow direction  1 1 0  Write a sentence   1 1 0  Copy design  1 1 0  Total score  30 24 22         11/08/2023   10:11 AM 07/17/2018   10:26 AM  6CIT Screen  What Year? 0 points 0 points  What month? 0 points 0 points  What time? 0 points 0 points  Count back from 20 0 points 0 points  Months in reverse 0 points 0 points  Repeat phrase 0 points 0 points  Total Score 0 points 0 points    Immunizations Immunization History  Administered Date(s) Administered   Fluad Trivalent(High Dose 65+) 08/23/2023   Influenza, High Dose Seasonal PF 06/13/2021   Influenza,inj,Quad PF,6+ Mos 07/08/2014   Influenza-Unspecified 08/31/2022   PFIZER Comirnaty(Gray Top)Covid-19 Tri-Sucrose Vaccine 11/04/2020   PFIZER(Purple Top)SARS-COV-2  Vaccination 05/13/2020, 06/03/2020   PNEUMOCOCCAL CONJUGATE-20 02/01/2021   Pfizer Covid-19 Vaccine Bivalent Booster 46yrs & up 07/14/2021   Td 09/11/1997, 06/04/2008   Td (Adult),unspecified 09/11/1997, 06/04/2008   Tdap 08/13/2020, 09/28/2023    Screening Tests Health Maintenance  Topic Date Due   Zoster Vaccines- Shingrix (1 of 2) Never done   COLON CANCER SCREENING ANNUAL FOBT  12/02/2022   COVID-19 Vaccine (5 - 2024-25 season) 05/13/2023   Colonoscopy  11/14/2023   Medicare Annual Wellness (AWV)  11/07/2024   DTaP/Tdap/Td (7 - Td or Tdap) 09/27/2033   Pneumonia Vaccine 24+ Years old  Completed   INFLUENZA VACCINE  Completed   Hepatitis C Screening  Completed   HPV VACCINES  Aged Out    Health Maintenance  Health Maintenance Due  Topic Date Due   Zoster Vaccines- Shingrix (1 of 2) Never done   COLON CANCER SCREENING ANNUAL FOBT  12/02/2022   COVID-19 Vaccine (5 - 2024-25 season) 05/13/2023   Colonoscopy  11/14/2023   Health Maintenance Items Addressed: Yes; Patient is aware that he is due for a Colonoscopy 11/2023 and also due for Shingrix and Covid-19 vaccine.  Additional Screening:  Vision Screening: Recommended annual ophthalmology exams for early detection of glaucoma and other  disorders of the eye.  Dental Screening: Recommended annual dental exams for proper oral hygiene  Community Resource Referral / Chronic Care Management: CRR required this visit?  No   CCM required this visit?  No     Plan:     I have personally reviewed and noted the following in the patient's chart:   Medical and social history Use of alcohol, tobacco or illicit drugs  Current medications and supplements including opioid prescriptions. Patient is not currently taking opioid prescriptions. Functional ability and status Nutritional status Physical activity Advanced directives List of other physicians Hospitalizations, surgeries, and ER visits in previous 12 months Vitals Screenings to include cognitive, depression, and falls Referrals and appointments  In addition, I have reviewed and discussed with patient certain preventive protocols, quality metrics, and best practice recommendations. A written personalized care plan for preventive services as well as general preventive health recommendations were provided to patient.     Mickeal Needy, LPN   [id number]   After Visit Summary: (Declined) Due to this being a telephonic visit, with patients personalized plan was offered to patient but patient Declined AVS at this time   Notes: Please refer to Routing Comments.

## 2023-11-08 NOTE — Patient Instructions (Addendum)
 Mr. Maher , Thank you for taking time to come for your Medicare Wellness Visit. I appreciate your ongoing commitment to your health goals. Please review the following plan we discussed and let me know if I can assist you in the future.   Referrals/Orders/Follow-Ups/Clinician Recommendations: Yes; Keep maintaining your health by keeping your appointments with Dr. Pollie Meyer and any specialists that you may see.  Call us if you need anything.  Have a great year!!!!  This is a list of the screening recommended for you and due dates:  Health Maintenance  Topic Date Due   Zoster (Shingles) Vaccine (1 of 2) Never done   Stool Blood Test  12/02/2022   COVID-19 Vaccine (5 - 2024-25 season) 05/13/2023   Colon Cancer Screening  11/14/2023   Medicare Annual Wellness Visit  11/07/2024   DTaP/Tdap/Td vaccine (7 - Td or Tdap) 09/27/2033   Pneumonia Vaccine  Completed   Flu Shot  Completed   Hepatitis C Screening  Completed   HPV Vaccine  Aged Out    Advanced directives: (Declined) Advance directive discussed with you today. Even though you declined this today, please call our office should you change your mind, and we can give you the proper paperwork for you to fill out.  Next Medicare Annual Wellness Visit scheduled for next year: Yes   Preventive Care attachment FALL PREVENTION attachment

## 2023-11-13 ENCOUNTER — Other Ambulatory Visit: Payer: Self-pay | Admitting: Family Medicine

## 2023-11-13 DIAGNOSIS — Z1211 Encounter for screening for malignant neoplasm of colon: Secondary | ICD-10-CM

## 2023-11-20 ENCOUNTER — Ambulatory Visit: Payer: 59 | Attending: Cardiovascular Disease | Admitting: *Deleted

## 2023-11-20 DIAGNOSIS — Z5181 Encounter for therapeutic drug level monitoring: Secondary | ICD-10-CM | POA: Diagnosis not present

## 2023-11-20 DIAGNOSIS — I4821 Permanent atrial fibrillation: Secondary | ICD-10-CM

## 2023-11-20 LAB — POCT INR: INR: 2.5 (ref 2.0–3.0)

## 2023-11-20 NOTE — Patient Instructions (Addendum)
 Description   Continue taking warfarin 1 tablet daily except 1.5 tablets on Fridays. Recheck INR in 6 weeks. Coumadin Clinic [phone] or [phone]

## 2023-11-21 ENCOUNTER — Encounter: Payer: Self-pay | Admitting: Family Medicine

## 2023-11-21 LAB — FECAL OCCULT BLOOD, IMMUNOCHEMICAL: Fecal Occult Bld: NEGATIVE

## 2023-12-01 ENCOUNTER — Other Ambulatory Visit: Payer: Self-pay | Admitting: Family Medicine

## 2023-12-05 LAB — LAB REPORT - SCANNED: EGFR: 53

## 2023-12-11 ENCOUNTER — Other Ambulatory Visit: Payer: Self-pay | Admitting: Cardiovascular Disease

## 2023-12-11 ENCOUNTER — Other Ambulatory Visit: Payer: Self-pay | Admitting: Family Medicine

## 2023-12-17 ENCOUNTER — Encounter: Payer: Self-pay | Admitting: Physician Assistant

## 2023-12-20 LAB — LAB REPORT - SCANNED
Creatinine, POC: 147 mg/dL
EGFR: 53

## 2023-12-24 ENCOUNTER — Encounter: Payer: Self-pay | Admitting: Cardiovascular Disease

## 2023-12-24 NOTE — Progress Notes (Unsigned)
 Cardiology Office Note   Date:  12/25/2023   ID:  Jesse Franklin, DOB 1951-04-01, MRN [id number]  PCP:  Jesse Dodrill, MD  Cardiologist:   Jesse Miss, MD   Chief Complaint  Patient presents with   Atrial Fibrillation   Coronary Artery Disease        1. Atrial fibrillation -  2. Coronary artery disease RESULTS: 08/30/04 1. Left main: Normal.   2. LAD: Large vessel, tortuous in the mid and distal section with mild   luminal irregularities. The extreme distal portion of the LAD after the   trifurcation of the distal vessel has a subtotal occlusion at the   trifurcation tip. Vessel less than 0.5 mm at this juncture.   3. LXC: Codominant with mild luminal irregularities.   4. OM1 and OM2: Large vessels with mild luminal irregularities.   5. RCA: Tortuous, codominant with mild luminal irregularities, very faint   right to left filling collaterals seen at the apex.   6. LV: EF 50-55% with mild inferior apical hypokinesis. LVEDP was 35   mmHg.   7. Abdominal/distal aortogram showed mild tortuosity. No significant   aneurysms were seen. The distal aorta showed mild splaying to the left   at the area of the iliac bifurcation. No significant iliac or femoral   disease was noted.  3. Hypertension 4. 6. Moderate pulmonary hypertension by echo in 2010 ( ongoing cigarette smoking)   5. Hyperlipidemia   Jesse Franklin is a 73 yo with hx as noted above. He has occasional episodes of left arm tingling. These episodes last 1-2 seconds. They're not necessarily associated with exertion.  The tingling seems to get better with arm movement. He also has occasional episodes of shortness of breath.  He walks on a regular basis. He walks approximately 1 mile a day.  He still eats some salt and also eats fried and fast foods.  December 02, 2013:  Jesse Franklin is doing well. He denies any chest pain or shortness of breath.  He walks about 15-20 minutes each day.  He no longer works.  He has cut down on  his salt usage.    Jesse Franklin 5, 2016:   Jesse Franklin is a 73 y.o. male who presents for follow-up of his atrial fibrillation. He also has a history of hypertension  Still smoking  -   Nov. 1, 2016:  Doing well from a cardiac standpoint.  Having gout issues  in left elbow and hand.  No Cp or dyspnea.  Still smoking   January 03, 2016:    Jesse Franklin is seen today  No CP , some back pain  Still smoking    December 25, 2016  occasional cp .  No changes in his angina pattern Still smoking   July 27, 2017:  Staying active , no further CP or dyspnea .  Sells loose cigaretes.  $0.50 per cigarette   May 09, 2018:  Jesse Franklin was seen today for follow-up visit. History of coronary artery disease and atrial fibrillation. He remains on Coumadin. HR is slow (junctional escape rhythm) sees.  He is not having any episodes of weakness or dizziness.  He has occasional episodes of left-sided arm and left-sided shoulder pain. Last few seconds. No chest pain with walking.  He still smokes 3 to 4 cigarettes a day. Also smokes marijuana   He has known coronary artery disease.  He has a distal LAD subtotal occlusion by heart catheterization in 2005.   December 19, 2019:  Jesse Franklin is seen back today for follow up visit for his CAD, atrial fib and HLD Has rare cp.   Does not feel like his pain before his stent .   Last for 3-4 seconds.  Had to take NTG on 1 occasion.   Walks around some  Walking does not bring on the cp.  Typically occurs while he is sitting   Still smokes several cigarettes a day .   We discussed cessation efforts.   January 03, 2021:  Jesse Franklin is seen today for follow up of his CAD, atrial fib, HLD Hx of coronary stenting in the past  Rare chest pain . Left arm tingling ,  Last for several seconds  Not associated with exercise  Walks his every day .  1/4 mile a day  Still smoking 4-5 cigarettes a day    November 25, 2021 Jesse Franklin is seen today for follow up of his CAD, atrial fib, HLD   Pt needs to have ureteroscopy with rescection of bladder tumor - scheduled for April 21.  Wll need to hold warfarin  He will get those instructions re: coumadin from our coumadin clinic   He is at low risk for   Rare episodes of DOE  Works at [employer]   Has lost some weight while working  No angina similar to prior to his stenting    November 21, 2022 Jesse Franklin is seen for follow up visit  Hx of CAD , atrial fib,   Works at [employer] on [employer] Is active  No CP or dyspnea at work   Has his coumadin check out our coumadin clinic at [facility]    December 25, 2023: Jesse Franklin is seen today for follow-up visit.  He has a history of atrial fibrillation and coronary artery disease.  Unfortunately, he continues to smoke.  Past Medical History:  Diagnosis Date   2015    Alcohol abuse    Arthritis    Atrial fibrillation (HCC)    BPH 11/08/2006   CAD (coronary artery disease)    Branch Vessel   Chronic kidney disease    Cocaine abuse (HCC)    Hx of   Depression    GERD (gastroesophageal reflux disease)    Gout    Hallucination    "smoke marijuana to prevent hallucinations", started in 1999, last one 11/2021   Herpes    Herpes genitalia 04/13/2011   History of kidney stones    Hyperlipidemia    Hypertension    Myocardial infarction (HCC) 2005   RHINITIS, ALLERGIC 11/08/2006   Tobacco abuse     Past Surgical History:  Procedure Laterality Date   CARDIAC CATHETERIZATION  08/11/2004   CARPAL TUNNEL RELEASE Right 04/04/2017   Procedure: RIGHT CARPAL TUNNEL RELEASE;  Surgeon: Wes Hamman, MD;  Location: [facility];  Service: Orthopedics;  Laterality: Right;   CYSTOSCOPY W/ RETROGRADES Bilateral 01/16/2022   Procedure: CYSTOSCOPY WITH RETROGRADE PYELOGRAM;  Surgeon: Samson Croak, MD;  Location: [facility];  Service: Urology;  Laterality: Bilateral;   CYSTOSCOPY W/ URETERAL STENT REMOVAL Right 02/13/2022   Procedure: CYSTOSCOPY WITH RIGHT STENT REMOVAL;  Surgeon:  Samson Croak, MD;  Location: [facility];  Service: Urology;  Laterality: Right;  30 MINS FOR THIS CASE   LACERATION REPAIR Right 03/11/1988   "hand"   URETEROSCOPY Right 01/16/2022   Procedure: URETEROSCOPY WITH LASER LITHOTRIPSY STONE BASKET EXTRACTION, STENT PLACEMENT;  Surgeon: Samson Croak, MD;  Location: [facility];  Service: Urology;  Laterality: Right;     Current Outpatient Medications  Medication Sig Dispense Refill   allopurinol (ZYLOPRIM) 100 MG tablet Take 100 mg by mouth 2 (two) times daily.     amLODipine (NORVASC) 10 MG tablet TAKE 1 TABLET BY MOUTH EVERY DAY 90 tablet 3   Baclofen 5 MG TABS TAKE 1/2 TABLET BY MOUTH DAILY AS NEEDED 45 tablet 1   clopidogrel (PLAVIX) 75 MG tablet TAKE 1 TABLET BY MOUTH EVERY DAY 90 tablet 1   colchicine 0.6 MG tablet TAKE 1 TABLET BY MOUTH 2 TIMES DAILY. 180 tablet 0   famotidine (PEPCID) 20 MG tablet TAKE 1 TABLET BY MOUTH TWICE A DAY 180 tablet 3   losartan (COZAAR) 50 MG tablet Take 50 mg by mouth daily.     nitroGLYCERIN (NITROSTAT) 0.4 MG SL tablet Place 1 tablet (0.4 mg total) under the tongue as directed. Place 1 tab under tongue as directed 25 tablet 6   pantoprazole (PROTONIX) 40 MG tablet TAKE 1 TABLET BY MOUTH EVERY DAY 90 tablet 1   rosuvastatin (CRESTOR) 40 MG tablet TAKE 1 TABLET BY MOUTH EVERY DAY 90 tablet 3   tamsulosin (FLOMAX) 0.4 MG CAPS capsule TAKE 1 CAPSULE BY MOUTH EVERY DAY 90 capsule 1   warfarin (COUMADIN) 5 MG tablet TAKE 1 TO 1 AND 1/2 TABLETS BY MOUTH ONCE DAILY OR AS DIRECTED BY COUMADIN CLINIC. 100 tablet 1   mupirocin ointment (BACTROBAN) 2 % Apply 1 Application topically 2 (two) times daily. Apply to the left middle finger wound twice daily and cover with a dry dressing for 7 days (Patient not taking: Reported on 12/25/2023) 22 g 0   No current facility-administered medications for this visit.    Allergies:   Codeine and Ketorolac tromethamine    Social History:  The patient  reports that he has been  smoking cigarettes. He has a 12 pack-year smoking history. He quit smokeless tobacco use about 56 years ago.  His smokeless tobacco use included chew. He reports that he does not currently use alcohol. He reports current drug use. Frequency: 3.00 times per week. Drug: Marijuana.   Family History:  The patient's family history includes Alcohol abuse in his brother and father; Arthritis in his brother; Cancer in his sister; Gout in his mother; Heart disease in his brother; Hypertension in his mother and sister.    ROS:   Noted in current history, otherwise review of systems is negative.   Physical Exam: Blood pressure 114/62, pulse (!) 49, height 5\' 6"  (1.676 m), weight 161 lb 3.2 oz (73.1 kg), SpO2 97%.       GEN:  Well nourished, well developed in no acute distress HEENT: Normal NECK: No JVD; No carotid bruits LYMPHATICS: No lymphadenopathy CARDIAC: irreg. Irreg.   2/6 systolic murmur  RESPIRATORY:  Clear to auscultation without rales, wheezing or rhonchi  ABDOMEN: Soft, non-tender, non-distended MUSCULOSKELETAL:  No edema; No deformity  SKIN: Warm and dry NEUROLOGIC:  Alert and oriented x 3   EKG:            Recent Labs: 08/23/2023: BUN 15; Creatinine, Ser 1.34; Hemoglobin 12.2; Platelets 150; Potassium 4.2; Sodium 143    Lipid Panel    Component Value Date/Time   CHOL 120 08/23/2023 1555   TRIG 83 08/23/2023 1555   HDL 48 08/23/2023 1555   CHOLHDL 2.5 08/23/2023 1555   CHOLHDL 3.4 08/02/2016 0913   VLDL 26 08/02/2016 0913   LDLCALC 56 08/23/2023 1555   LDLDIRECT 95 05/28/2012  1610      Wt Readings from Last 3 Encounters:  12/25/23 161 lb 3.2 oz (73.1 kg)  11/08/23 159 lb (72.1 kg)  09/28/23 158 lb (71.7 kg)      Other studies Reviewed: Additional studies/ records that were reviewed today include: . Review of the above records demonstrates:    ASSESSMENT AND PLAN:  1. Atrial fibrillation-     he has persistent atrial fibrillation.  Heart rate is fairly  slow.  He denies any syncope or presyncope.  He is currently on warfarin.  He is seen in our Coumadin clinic. His last INR is 2.5.  His INRs look fairly consistent and therapeutic.   2. Coronary artery disease Cath  08/30/04 He denies any angina      3. Hypertension -  BP looks good     4.  Moderate pulmonary hypertension by echo in 2010 ( ongoing cigarette smoking)  -.      5. Hyperlipidemia -     LDL looks good at 56      Current medicines are reviewed at length with the patient today.  The patient does not have concerns regarding medicines.  The following changes have been made:  no change  Labs/ tests ordered today include:   No orders of the defined types were placed in this encounter.   Disposition:       Ahmad Alert, MD  12/25/2023 3:18 PM    [facility] [address] [place], [place], [place]  [address] Phone: [phone]; Fax: [phone]

## 2023-12-25 ENCOUNTER — Encounter: Payer: Self-pay | Admitting: Cardiovascular Disease

## 2023-12-25 ENCOUNTER — Ambulatory Visit: Attending: Cardiovascular Disease | Admitting: Cardiovascular Disease

## 2023-12-25 VITALS — BP 114/62 | HR 49 | Ht 66.0 in | Wt 161.2 lb

## 2023-12-25 DIAGNOSIS — I4821 Permanent atrial fibrillation: Secondary | ICD-10-CM | POA: Diagnosis not present

## 2023-12-25 DIAGNOSIS — I272 Pulmonary hypertension, unspecified: Secondary | ICD-10-CM | POA: Diagnosis not present

## 2023-12-25 DIAGNOSIS — I10 Essential (primary) hypertension: Secondary | ICD-10-CM

## 2023-12-25 DIAGNOSIS — I25119 Atherosclerotic heart disease of native coronary artery with unspecified angina pectoris: Secondary | ICD-10-CM | POA: Diagnosis not present

## 2023-12-25 NOTE — Patient Instructions (Signed)
 Medication Instructions:  Your physician recommends that you continue on your current medications as directed. Please refer to the Current Medication list given to you today.  *If you need a refill on your cardiac medications before your next appointment, please call your pharmacy*  Follow-Up: At [facility], you and your health needs are our priority.  As part of our continuing mission to provide you with exceptional heart care, we have created designated Provider Care Teams.  These Care Teams include your primary Cardiologist (physician) and Advanced Practice Providers (APPs -  Physician Assistants and Nurse Practitioners) who all work together to provide you with the care you need, when you need it.  Your next appointment:   1 year(s)  The format for your next appointment:   In Person  Provider:   Kristeen Miss, MD {  Other Instructions   1st Floor: - Lobby - Registration  - Pharmacy  - Lab - Cafe  2nd Floor: - PV Lab - Diagnostic Testing (echo, CT, nuclear med)  3rd Floor: - Vacant  4th Floor: - TCTS (cardiothoracic surgery) - AFib Clinic - Structural Heart Clinic - Vascular Surgery  - Vascular Ultrasound  5th Floor: - HeartCare Cardiology (general and EP) - Clinical Pharmacy for coumadin, hypertension, lipid, weight-loss medications, and med management appointments    Valet parking services will be available as well.

## 2023-12-26 ENCOUNTER — Telehealth: Payer: Self-pay

## 2023-12-26 NOTE — Telephone Encounter (Signed)
 Patient with diagnosis of A Fib on warfarin for anticoagulation.    Procedure: Colonoscopy / Endoscopy  Date of procedure: 01/23/24   CHA2DS2-VASc Score = 3  This indicates a 3.2% annual risk of stroke. The patient's score is based upon: CHF History: 1 HTN History: 1 Diabetes History: 0 Stroke History: 0 Vascular Disease History: 0 Age Score: 1 Gender Score: 0    CrCl 50 ml/min Platelet count 175K   Per office protocol, patient can hold warfarin for 5 days prior to procedure. If MD requires a 7 day warfarin hold will need approval from Dr Alroy Aspen.  Patient will not need bridging with Lovenox (enoxaparin) around procedure.  **This guidance is not considered finalized until pre-operative APP has relayed final recommendations.**

## 2023-12-26 NOTE — Telephone Encounter (Signed)
 Pharmacy please advise on holding warfarin prior to colonoscopy/endoscopy in scheduled for 01/23/2024. Thank you.

## 2023-12-26 NOTE — Telephone Encounter (Signed)
   Pre-operative Risk Assessment    Patient Name: Jesse Franklin  DOB: 23-Jul-1951 MRN: [id number]   Date of last office visit: 12/25/23 Alroy Aspen, MD Date of next office visit: NA  Request for Surgical Clearance    Procedure:   Colonoscopy / Endoscopy  Date of Surgery:  Clearance 01/23/24                                 Surgeon:  Dr. Evangeline Hilts Surgeon's Group or Practice Name:  [facility]  Phone number:  [phone] Fax number:  [phone]   Type of Clearance Requested:   - Medical  - Pharmacy:  Hold Warfarin (Coumadin) 7 days prior to procedure and Plavix 5 days prior to procedure.   Type of Anesthesia:   Propofol   Additional requests/questions:    Stop date : Warfarin 01/16/24   Plavix 01/18/24 Start date : 01/24/24  SignedTheodis Fiscal   12/26/2023, 12:25 PM

## 2023-12-26 NOTE — Telephone Encounter (Signed)
 Dr. Venetta Gill  We have received a surgical clearance request for Jesse Franklin for upcoming colonoscopy/endoscopy. They were seen recently in clinic on 12/25/2023. Can you please comment on surgical clearance and guidance on holding Plavix prior to his scheduled colonoscopy procedure. Please forward you guidance and recommendations to P CV DIV PREOP   Thank you, Charles Connor, NP

## 2023-12-28 NOTE — Telephone Encounter (Signed)
   Patient Name: Jesse Franklin  DOB: 08-01-1951 MRN: [id number]  Primary Cardiologist: Ahmad Alert, MD  Chart reviewed as part of pre-operative protocol coverage. Given past medical history and time since last visit, based on ACC/AHA guidelines, BERISH BOHMAN is at acceptable risk for the planned procedure without further cardiovascular testing.   Per Dr. Alroy Aspen He may hold his warfarin for 5-7 days .  Restart warfarin as soon as it is safe following the procedure.  The patient was advised that if he develops new symptoms prior to surgery to contact our office to arrange for a follow-up visit, and he verbalized understanding.  I will route this recommendation to the requesting party via Epic fax function and remove from pre-op pool.  Please call with questions.  Francene Ing, Retha Cast, NP 12/28/2023, 7:39 AM

## 2024-01-01 ENCOUNTER — Ambulatory Visit: Attending: Cardiology

## 2024-01-01 DIAGNOSIS — Z5181 Encounter for therapeutic drug level monitoring: Secondary | ICD-10-CM

## 2024-01-01 DIAGNOSIS — I4821 Permanent atrial fibrillation: Secondary | ICD-10-CM

## 2024-01-01 LAB — POCT INR: INR: 2.4 (ref 2.0–3.0)

## 2024-01-01 NOTE — Patient Instructions (Signed)
 Continue taking warfarin 1 tablet daily except 1.5 tablets on Fridays. Recheck INR in 4 weeks. Coumadin  Clinic [phone] or [phone] Colonoscopy 5/14 Holding Coumadin  5/9-13;  Take 1.5 tablets on 5/14 and 5/15

## 2024-01-07 ENCOUNTER — Ambulatory Visit: Admitting: Family Medicine

## 2024-01-16 NOTE — Telephone Encounter (Signed)
 Tiffany at [facility] is requesting a callback at [phone] regarding the office not receiving any documentation of the plavix  being held before pt's procedure. Please advise

## 2024-01-16 NOTE — Telephone Encounter (Signed)
 I will forward to preop APP to review about Plavix  hold

## 2024-01-16 NOTE — Telephone Encounter (Signed)
   Patient Name: Jesse Franklin  DOB: [birth year] MRN: [id number]  Primary Cardiologist: Ahmad Alert, MD  Chart reviewed as part of pre-operative protocol coverage.   Per Dr. Alroy Aspen He may hold his warfarin for 5-7 days.  Restart warfarin as soon as it is safe following the procedure.   Dr. Alroy Aspen has previously approved the holding of Plavix  for 5 days prior to procedures, please resume when safe to do so from a bleeding standpoint.   I will route this recommendation to the requesting party via Epic fax function and remove from pre-op pool.  Please call with questions.  Anvith Mauriello D Lindee Leason, NP 01/16/2024, 1:53 PM

## 2024-01-23 LAB — HM COLONOSCOPY

## 2024-01-30 ENCOUNTER — Ambulatory Visit: Attending: Cardiovascular Disease | Admitting: *Deleted

## 2024-01-30 DIAGNOSIS — I4821 Permanent atrial fibrillation: Secondary | ICD-10-CM | POA: Diagnosis not present

## 2024-01-30 DIAGNOSIS — Z5181 Encounter for therapeutic drug level monitoring: Secondary | ICD-10-CM

## 2024-01-30 LAB — POCT INR: INR: 1.4 — AB (ref 2.0–3.0)

## 2024-01-30 NOTE — Patient Instructions (Signed)
 Description   Today take 1.5 tablets and 1.5 tablets of warfarin tomorrow then continue taking warfarin 1 tablet daily except 1.5 tablets on Fridays. Recheck INR in 1 week.  Coumadin  Clinic [phone] or [phone] Colonoscopy 5/14 Holding Coumadin  5/9-13;  Take 1.5 tablets on 5/14 and 5/15

## 2024-02-05 ENCOUNTER — Encounter: Payer: Self-pay | Admitting: Family Medicine

## 2024-02-05 ENCOUNTER — Ambulatory Visit (INDEPENDENT_AMBULATORY_CARE_PROVIDER_SITE_OTHER): Admitting: Family Medicine

## 2024-02-05 VITALS — BP 135/69 | HR 42 | Ht 65.0 in | Wt 158.6 lb

## 2024-02-05 DIAGNOSIS — I10 Essential (primary) hypertension: Secondary | ICD-10-CM

## 2024-02-05 DIAGNOSIS — M1A09X0 Idiopathic chronic gout, multiple sites, without tophus (tophi): Secondary | ICD-10-CM | POA: Diagnosis not present

## 2024-02-05 DIAGNOSIS — K219 Gastro-esophageal reflux disease without esophagitis: Secondary | ICD-10-CM

## 2024-02-05 DIAGNOSIS — N401 Enlarged prostate with lower urinary tract symptoms: Secondary | ICD-10-CM

## 2024-02-05 DIAGNOSIS — R351 Nocturia: Secondary | ICD-10-CM

## 2024-02-05 DIAGNOSIS — I25119 Atherosclerotic heart disease of native coronary artery with unspecified angina pectoris: Secondary | ICD-10-CM | POA: Diagnosis not present

## 2024-02-05 DIAGNOSIS — E7800 Pure hypercholesterolemia, unspecified: Secondary | ICD-10-CM | POA: Diagnosis not present

## 2024-02-05 DIAGNOSIS — I4821 Permanent atrial fibrillation: Secondary | ICD-10-CM

## 2024-02-05 DIAGNOSIS — Z0000 Encounter for general adult medical examination without abnormal findings: Secondary | ICD-10-CM

## 2024-02-05 MED ORDER — TAMSULOSIN HCL 0.4 MG PO CAPS: 0.8000 mg | ORAL_CAPSULE | Freq: Every day | ORAL | 1 refills | Status: DC

## 2024-02-05 NOTE — Assessment & Plan Note (Signed)
 Well-controlled on recent lipids, continue Crestor 

## 2024-02-05 NOTE — Assessment & Plan Note (Addendum)
 Remains on Coumadin  without issue Bradycardic today consistent with prior values, prior junctional rhythm, asymptomatic, monitor

## 2024-02-05 NOTE — Assessment & Plan Note (Signed)
 Well-controlled with colchicine  and allopurinol, continue

## 2024-02-05 NOTE — Assessment & Plan Note (Signed)
 At goal with amlodipine  and losartan, continue

## 2024-02-05 NOTE — Progress Notes (Signed)
  Date of Visit: 02/05/2024   SUBJECTIVE:   HPI:  Loc presents today for routine follow-up and medication reconciliation.  He has several questions about his various medications.  Gout: Currently taking colchicine  0.6 mg twice a day and allopurinol 200 mg daily.  Reports gout is well-controlled with this regimen.  He does see rheumatology for this.  Hypertension: Currently taking amlodipine  5 mg daily and losartan 100 mg daily.  Tolerating this regimen well.  Acid reflux: Taking famotidine  20 mg twice daily and Protonix  40 mg daily.  Reports some flareups recently of his acid reflux.  Reports recently had a colonoscopy and EGD through [facility] endoscopy center.  Hyperlipidemia: Currently taking rosuvastatin  40 mg daily.  CAD: Taking Plavix  75 mg daily, also has nitroglycerin  as needed.  Atrial fibrillation: Taking Coumadin  as instructed by Coumadin  clinic.  Tolerating this well.  Muscle spasms: Has baclofen  5 mg tablets, instructed to take half a pill daily as needed for muscle pains.  On further review, he reports taking this every single day scheduled.  BPH: Taking tamsulosin  0.4 mg daily.  Reports frequent urination and some leaking.  OBJECTIVE:   BP 135/69   Pulse (!) 42   Ht 5\' 5"  (1.651 m)   Wt 158 lb 9.6 oz (71.9 kg)   SpO2 99%   BMI 26.39 kg/m  Gen: No acute distress, pleasant, cooperative, well-appearing HEENT: Normocephalic, atraumatic Heart: Bradycardic, regular rhythm, no murmurs Lungs: Clear, normal effort on room air Neuro: Alert, grossly nonfocal, speech normal  ASSESSMENT/PLAN:   Assessment & Plan HYPERCHOLESTEROLEMIA Well-controlled on recent lipids, continue Crestor  Chronic gout of multiple sites, unspecified cause Well-controlled with colchicine  and allopurinol, continue HYPERTENSION, BENIGN ESSENTIAL At goal with amlodipine  and losartan, continue Coronary artery disease involving native coronary artery of native heart with angina pectoris  (HCC) Doing well, follows regularly with cardiology, continue Plavix  Permanent atrial fibrillation (HCC) Remains on Coumadin  without issue Bradycardic today consistent with prior values, prior junctional rhythm, asymptomatic, monitor Routine adult health maintenance Declined shingles vaccine Request records from recent colonoscopy Benign prostatic hyperplasia with nocturia Increasing lower urinary tract symptoms Increase Flomax  to 0.8 mg daily Check PSA today Gastroesophageal reflux disease, unspecified whether esophagitis present Overall stable, following with GI   Reviewed medications in detail with patient, labeled bottles with indications, consolidated same medications  FOLLOW UP: Follow up in 6 months for above issues  [place] J. Dawn Eth, MD [facility]

## 2024-02-05 NOTE — Assessment & Plan Note (Signed)
 Increasing lower urinary tract symptoms Increase Flomax  to 0.8 mg daily Check PSA today

## 2024-02-05 NOTE — Assessment & Plan Note (Signed)
 Overall stable, following with GI

## 2024-02-05 NOTE — Assessment & Plan Note (Signed)
 Doing well, follows regularly with cardiology, continue Plavix 

## 2024-02-05 NOTE — Patient Instructions (Signed)
 It was great to see you again today.  Increase flomax  (tamsulosin ) to 0.8mg  daily  Call with any questions  Follow up in 6 months, sooner if needed  Be well, Dr. Dawn Eth

## 2024-02-05 NOTE — Assessment & Plan Note (Signed)
 Declined shingles vaccine Request records from recent colonoscopy

## 2024-02-06 LAB — PSA: Prostate Specific Ag, Serum: 1.8 ng/mL (ref 0.0–4.0)

## 2024-02-07 ENCOUNTER — Ambulatory Visit: Payer: Self-pay | Admitting: Family Medicine

## 2024-02-08 ENCOUNTER — Ambulatory Visit: Attending: Cardiovascular Disease

## 2024-02-08 DIAGNOSIS — Z5181 Encounter for therapeutic drug level monitoring: Secondary | ICD-10-CM

## 2024-02-08 DIAGNOSIS — I4821 Permanent atrial fibrillation: Secondary | ICD-10-CM | POA: Diagnosis not present

## 2024-02-08 LAB — POCT INR: INR: 2.3 (ref 2.0–3.0)

## 2024-02-08 NOTE — Patient Instructions (Signed)
 Description   Continue taking warfarin 1 tablet daily except 1.5 tablets on Fridays.  Recheck INR in 4 weeks.  Coumadin  Clinic [phone]

## 2024-02-14 ENCOUNTER — Ambulatory Visit: Admitting: Physician Assistant

## 2024-02-27 ENCOUNTER — Other Ambulatory Visit: Payer: Self-pay | Admitting: Nurse Practitioner

## 2024-02-27 DIAGNOSIS — K7460 Unspecified cirrhosis of liver: Secondary | ICD-10-CM

## 2024-02-27 DIAGNOSIS — K760 Fatty (change of) liver, not elsewhere classified: Secondary | ICD-10-CM

## 2024-03-04 ENCOUNTER — Ambulatory Visit
Admission: RE | Admit: 2024-03-04 | Discharge: 2024-03-04 | Discharge status: Home / Self Care | Source: Ambulatory Visit | Attending: Nurse Practitioner

## 2024-03-04 DIAGNOSIS — K7460 Unspecified cirrhosis of liver: Secondary | ICD-10-CM

## 2024-03-04 DIAGNOSIS — K760 Fatty (change of) liver, not elsewhere classified: Secondary | ICD-10-CM

## 2024-03-06 ENCOUNTER — Other Ambulatory Visit: Payer: Self-pay | Admitting: Family Medicine

## 2024-03-07 ENCOUNTER — Ambulatory Visit

## 2024-03-12 ENCOUNTER — Ambulatory Visit: Attending: Cardiovascular Disease | Admitting: Pharmacist

## 2024-03-12 DIAGNOSIS — I4821 Permanent atrial fibrillation: Secondary | ICD-10-CM

## 2024-03-12 DIAGNOSIS — Z5181 Encounter for therapeutic drug level monitoring: Secondary | ICD-10-CM | POA: Diagnosis not present

## 2024-03-12 LAB — POCT INR: INR: 3.1 — AB (ref 2.0–3.0)

## 2024-03-12 NOTE — Progress Notes (Signed)
 See anticoag encounter

## 2024-03-12 NOTE — Patient Instructions (Signed)
 Description   Eat some greens today and then continue taking warfarin 1 tablet daily except 1.5 tablets on Fridays.  Recheck INR in 4 weeks.  Coumadin  Clinic [phone]

## 2024-03-18 ENCOUNTER — Other Ambulatory Visit: Payer: Self-pay | Admitting: Family Medicine

## 2024-03-20 ENCOUNTER — Telehealth: Payer: Self-pay

## 2024-03-20 NOTE — Telephone Encounter (Signed)
 Received call from Powell, NP with [facility].   She reports A1c level of 6.8.  Patient does not have a documented history of diabetes.   Called patient to schedule follow up visit with PCP. He did not answer, LVM asking that he return call to office to schedule visit.   Chiquita JAYSON English, RN

## 2024-03-20 NOTE — Telephone Encounter (Signed)
 Thank you - agree he needs to be seen here for us  to repeat A1c  Laymon JINNY Legions, MD

## 2024-04-09 ENCOUNTER — Ambulatory Visit: Attending: Cardiovascular Disease

## 2024-04-09 DIAGNOSIS — I4821 Permanent atrial fibrillation: Secondary | ICD-10-CM | POA: Diagnosis not present

## 2024-04-09 DIAGNOSIS — Z5181 Encounter for therapeutic drug level monitoring: Secondary | ICD-10-CM | POA: Diagnosis not present

## 2024-04-09 LAB — POCT INR: INR: 3.1 — AB (ref 2.0–3.0)

## 2024-04-09 NOTE — Progress Notes (Signed)
 INR-3.1; Please see anticoagulation encounter.

## 2024-04-09 NOTE — Patient Instructions (Signed)
 Description   Eat some greens today and then continue taking warfarin 1 tablet daily except 1.5 tablets on Fridays.  Recheck INR in 5 weeks.  Coumadin  Clinic [phone]

## 2024-04-14 ENCOUNTER — Encounter: Payer: Self-pay | Admitting: Family Medicine

## 2024-04-14 ENCOUNTER — Ambulatory Visit (INDEPENDENT_AMBULATORY_CARE_PROVIDER_SITE_OTHER): Admitting: Family Medicine

## 2024-04-14 VITALS — BP 128/73 | HR 60 | Ht 65.0 in | Wt 161.2 lb

## 2024-04-14 DIAGNOSIS — N401 Enlarged prostate with lower urinary tract symptoms: Secondary | ICD-10-CM

## 2024-04-14 DIAGNOSIS — Z131 Encounter for screening for diabetes mellitus: Secondary | ICD-10-CM

## 2024-04-14 DIAGNOSIS — R351 Nocturia: Secondary | ICD-10-CM | POA: Diagnosis not present

## 2024-04-14 LAB — POCT GLYCOSYLATED HEMOGLOBIN (HGB A1C): Hemoglobin A1C: 5.5 % (ref 4.0–5.6)

## 2024-04-14 NOTE — Patient Instructions (Signed)
 It was great to see you again today.  Referring to urology A1c here was normal  Follow up in 6 months, sooner if needed  Be well, Dr. Donah

## 2024-04-14 NOTE — Assessment & Plan Note (Signed)
 Ongoing symptoms despite increase of Flomax .  Refer to urology for further evaluation.  Recent PSA normal.

## 2024-04-14 NOTE — Progress Notes (Signed)
  Date of Visit: 04/14/2024   SUBJECTIVE:   HPI:  Jesse Franklin presents today for follow-up of elevated A1c.  Recently had a NP visit his home from his insurance company.  They did an A1c and it was reportedly 6.8.  He has no history of diabetes.  BPH: Recent PSA normal.  Last visit we increased Flomax  to 0.8 mg daily due to ongoing urinary incontinence symptoms.  He is still having these issues.  Finds himself dribbling during the day while walking.   OBJECTIVE:   BP 128/73   Pulse 60   Ht 5' 5 (1.651 m)   Wt 161 lb 3.2 oz (73.1 kg)   SpO2 98%   BMI 26.83 kg/m  Gen: No acute distress, pleasant, cooperative, well-appearing HEENT: Normocephalic, atraumatic Lungs: Normal effort on room air Neuro: Grossly nonfocal, speech normal, gait normal  ASSESSMENT/PLAN:   Assessment & Plan Screening for diabetes mellitus A1c today 5.5.  No diagnosis of diabetes or prediabetes.  Suspect a lab error with the home visit lab that was done.  Consider rechecking in several months. Benign prostatic hyperplasia with nocturia Ongoing symptoms despite increase of Flomax .  Refer to urology for further evaluation.  Recent PSA normal.    FOLLOW UP: Follow up in 6 months with me for routine medical problems Referring to urology  [place] J. Donah, MD [facility]

## 2024-05-05 ENCOUNTER — Telehealth: Payer: Self-pay | Admitting: Cardiovascular Disease

## 2024-05-05 ENCOUNTER — Other Ambulatory Visit: Payer: Self-pay

## 2024-05-05 DIAGNOSIS — I4891 Unspecified atrial fibrillation: Secondary | ICD-10-CM

## 2024-05-05 MED ORDER — WARFARIN SODIUM 5 MG PO TABS: ORAL_TABLET | ORAL | 1 refills | Status: DC

## 2024-05-05 NOTE — Telephone Encounter (Signed)
*  STAT* If patient is at the pharmacy, call can be transferred to refill team.   1. Which medications need to be refilled? (please list name of each medication and dose if known) warfarin (COUMADIN ) 5 MG tablet    2. Would you like to learn more about the convenience, safety, & potential cost savings by using the [facility]? No   3. Are you open to using the [facility] (Type [facility].) No   4. Which pharmacy/location (including street and city if local pharmacy) is medication to be sent to?  [id number] - [place], [place] - [address]   5. Do they need a 30 day or 90 day supply? 90 day  Pt is out of medication

## 2024-05-14 ENCOUNTER — Ambulatory Visit: Attending: Cardiovascular Disease | Admitting: *Deleted

## 2024-05-14 ENCOUNTER — Encounter: Payer: Self-pay | Admitting: Oral Surgery

## 2024-05-14 DIAGNOSIS — Z5181 Encounter for therapeutic drug level monitoring: Secondary | ICD-10-CM | POA: Diagnosis not present

## 2024-05-14 DIAGNOSIS — I4821 Permanent atrial fibrillation: Secondary | ICD-10-CM

## 2024-05-14 LAB — POCT INR: INR: 1.7 — AB (ref 2.0–3.0)

## 2024-05-14 NOTE — Progress Notes (Signed)
 Description   INR-1.7; Today take 1.5 tablets of warfarin then continue taking warfarin 1 tablet daily except 1.5 tablets on Fridays.  Recheck INR in 4 weeks.  [facility] [phone]

## 2024-05-14 NOTE — Patient Instructions (Signed)
 Description   INR-1.7; Today take 1.5 tablets of warfarin then continue taking warfarin 1 tablet daily except 1.5 tablets on Fridays.  Recheck INR in 4 weeks.  [facility] [phone]

## 2024-05-19 ENCOUNTER — Telehealth: Payer: Self-pay

## 2024-05-19 NOTE — Telephone Encounter (Signed)
   Pre-operative Risk Assessment    Patient Name: Jesse Franklin  DOB: Jul 10, 1951 MRN: [id number]   Date of last office visit: 12/25/23 ALEENE PASSE, MD Date of next office visit: NONE  Request for Surgical Clearance    Procedure:  Dental Extraction - Amount of Teeth to be Pulled:  1 TOOTH  Date of Surgery:  Clearance TBD                                Surgeon:  GLENDIA CHRISTELLA PRIMROSE, DMD, PA Surgeon's Group or Practice Name:  GLENDIA CHRISTELLA PRIMROSE, DMD, PA [facility] Phone number:  [phone] Fax number:  [phone]   Type of Clearance Requested:   - Medical  - Pharmacy:  Hold Clopidogrel  (Plavix ) and Warfarin (Coumadin )     Type of Anesthesia:  Not Indicated   Additional requests/questions:    Signed, Lucie DELENA Ku   05/19/2024, 9:49 AM

## 2024-05-19 NOTE — Telephone Encounter (Signed)
    Primary Cardiologist: Aleene Passe, MD (Inactive)  Chart reviewed as part of pre-operative protocol coverage. Simple dental extractions are considered low risk procedures per guidelines and generally do not require any specific cardiac clearance. It is also generally accepted that for simple extractions and dental cleanings, there is no need to interrupt blood thinner therapy.   SBE prophylaxis is not required for the patient.  I will route this recommendation to the requesting party via Epic fax function and remove from pre-op pool.  Please call with questions.  Josefa CHRISTELLA Beauvais, NP 05/19/2024, 11:50 AM

## 2024-05-26 ENCOUNTER — Other Ambulatory Visit: Payer: Self-pay

## 2024-05-26 DIAGNOSIS — I4891 Unspecified atrial fibrillation: Secondary | ICD-10-CM

## 2024-05-26 MED ORDER — AMLODIPINE BESYLATE 5 MG PO TABS: 5.0000 mg | ORAL_TABLET | Freq: Every day | ORAL | 0 refills | Status: DC

## 2024-05-26 MED ORDER — WARFARIN SODIUM 5 MG PO TABS: ORAL_TABLET | ORAL | 1 refills | Status: DC

## 2024-06-11 ENCOUNTER — Ambulatory Visit: Attending: Internal Medicine | Admitting: *Deleted

## 2024-06-11 DIAGNOSIS — I4821 Permanent atrial fibrillation: Secondary | ICD-10-CM

## 2024-06-11 DIAGNOSIS — Z5181 Encounter for therapeutic drug level monitoring: Secondary | ICD-10-CM | POA: Diagnosis not present

## 2024-06-11 LAB — POCT INR: INR: 1.7 — AB (ref 2.0–3.0)

## 2024-06-11 NOTE — Progress Notes (Signed)
 Description   INR-1.7; Today take 1.5 tablets of warfarin then START taking warfarin 1 tablet daily except 1.5 tablets on Mondays and Fridays.  Recheck INR in 3 weeks.  [facility] [phone]

## 2024-06-11 NOTE — Patient Instructions (Signed)
 Description   INR-1.7; Today take 1.5 tablets of warfarin then START taking warfarin 1 tablet daily except 1.5 tablets on Mondays and Fridays.  Recheck INR in 3 weeks.  [facility] [phone]

## 2024-06-30 ENCOUNTER — Telehealth: Payer: Self-pay | Admitting: Family Medicine

## 2024-07-01 NOTE — Telephone Encounter (Signed)
 Form placed in your box please finish when you have time. Thanks! Cassell [name] CMA

## 2024-07-02 ENCOUNTER — Ambulatory Visit: Attending: Cardiology

## 2024-07-02 DIAGNOSIS — I4821 Permanent atrial fibrillation: Secondary | ICD-10-CM | POA: Diagnosis not present

## 2024-07-02 DIAGNOSIS — Z5181 Encounter for therapeutic drug level monitoring: Secondary | ICD-10-CM | POA: Diagnosis not present

## 2024-07-02 LAB — POCT INR: INR: 1.9 — AB (ref 2.0–3.0)

## 2024-07-02 NOTE — Progress Notes (Signed)
 Description   INR-1.9; Today take 1.5 tablets of warfarin then START taking warfarin 1 tablet daily except 1.5 tablets on Mondays and Fridays.  Recheck INR in 3 weeks.  [facility] [phone]

## 2024-07-02 NOTE — Patient Instructions (Signed)
 Description   INR-1.9; Today take 1.5 tablets of warfarin then START taking warfarin 1 tablet daily except 1.5 tablets on Mondays and Fridays.  Recheck INR in 3 weeks.  [facility]inic [phone]

## 2024-07-14 NOTE — Telephone Encounter (Signed)
 Patient called to check on form.   Please Advise.   Thanks!

## 2024-07-14 NOTE — Telephone Encounter (Signed)
 Called patient to discuss as he has seemed very ambulatory during all recent visits He reports he is able to stand on his feet and work for 7-8 hours at a time multiple days per week Discussed this does not qualify him for a handicap placard; those handicap spots need to be preserved for people who truly cannot walk Patient understood. Asked that I discard the form, will place in shred box.  Laymon JINNY Legions, MD

## 2024-07-17 ENCOUNTER — Other Ambulatory Visit: Payer: Self-pay | Admitting: Family Medicine

## 2024-07-23 ENCOUNTER — Ambulatory Visit: Attending: Student in an Organized Health Care Education/Training Program | Admitting: Pharmacist

## 2024-07-23 DIAGNOSIS — Z5181 Encounter for therapeutic drug level monitoring: Secondary | ICD-10-CM

## 2024-07-23 DIAGNOSIS — I4821 Permanent atrial fibrillation: Secondary | ICD-10-CM | POA: Diagnosis not present

## 2024-07-23 LAB — POCT INR: INR: 1.9 — AB (ref 2.0–3.0)

## 2024-07-23 NOTE — Patient Instructions (Signed)
 Description   INR-1.9; Increase to 1.5 tablets on Monday, Wednesday, and Friday, 1 tablet all other days of the week  Recheck INR in 3 weeks.  [facility]inic [phone]

## 2024-07-23 NOTE — Progress Notes (Signed)
 Description   INR-1.9; Increase to 1.5 tablets on Monday, Wednesday, and Friday, 1 tablet all other days of the week  Recheck INR in 3 weeks.  [facility]inic [phone]

## 2024-08-04 ENCOUNTER — Encounter (HOSPITAL_COMMUNITY): Payer: Self-pay | Admitting: Oral Surgery

## 2024-08-04 NOTE — H&P (Signed)
  Patient: Jesse Franklin  PID: [id number]  DOB: May 10, 1951  SEX: Male   Patient referred by [facility], DDS for extraction tooth #20.  CC: On and off pain lower left back tooth. Requests sedation for extraction.   Past Medical History:  High Blood Pressure, Chest Pain or Angina, Heart Attack, High Cholesterol, Kidney Trouble, GERD, Mental Health problems, CAD, Acid Reflux, BPH, Atrial Fibrillation, Liver issues, Weed, Drug Abuse/Alcohol Abuse, Dental anxiety   Medications: Allopurinol, Amlodipine , Baclofen , Plavix , Colchicine , Famotidine , Losartan, Nitroglycerin , Rosuvastatin , Flomax , Warfarin    Allergies:     Codeine    Surgeries:   Cardiac cath 2005         Social History       Smoking: marijuana daily           Alcohol: quit drinking Drug use:  quit crack                           Exam: BMI 26. Edentulous maxilla. Missing lower molars. Percussion tender tooth # 20.   No purulence, edema, fluctuance, trismus. Oral cancer screening negative. Pharynx clear. No lymphadenopathy.  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 Non-restorable tooth #  20 .           Plan: 1. MD Clearance.  2. Cardiac  clearance.  3. Extraction Tooth # 20. IV sedation.                Rx: none              Risks and complications explained. Questions answered.   Glendia EMERSON Primrose, DMD

## 2024-08-04 NOTE — Progress Notes (Signed)
 SDW call. Patient states he will have to reschedule as he thought his surgery was Thursday; he has to work Friday.  VM left at Dr. Terrance office.

## 2024-08-08 ENCOUNTER — Ambulatory Visit (HOSPITAL_COMMUNITY): Admission: RE | Admit: 2024-08-08 | Source: Home / Self Care | Admitting: Oral Surgery

## 2024-08-08 SURGERY — DENTAL RESTORATION/EXTRACTIONS
Anesthesia: General

## 2024-08-13 ENCOUNTER — Ambulatory Visit: Attending: Cardiology | Admitting: *Deleted

## 2024-08-13 DIAGNOSIS — I4821 Permanent atrial fibrillation: Secondary | ICD-10-CM | POA: Diagnosis not present

## 2024-08-13 DIAGNOSIS — Z5181 Encounter for therapeutic drug level monitoring: Secondary | ICD-10-CM

## 2024-08-13 LAB — POCT INR: INR: 2.5 (ref 2.0–3.0)

## 2024-08-13 NOTE — Patient Instructions (Signed)
 Description   INR-2.5; Continue taking warfarin 1 tablet daily except 1.5 tablets on Monday, Wednesday, and Friday. Recheck INR in 4 weeks.  [facility]inic [phone]

## 2024-08-13 NOTE — Progress Notes (Signed)
 Description   INR-2.5; Continue taking warfarin 1 tablet daily except 1.5 tablets on Monday, Wednesday, and Friday. Recheck INR in 4 weeks.  [facility]inic [phone]

## 2024-08-18 ENCOUNTER — Other Ambulatory Visit: Payer: Self-pay | Admitting: Nurse Practitioner

## 2024-08-18 DIAGNOSIS — K760 Fatty (change of) liver, not elsewhere classified: Secondary | ICD-10-CM

## 2024-09-10 ENCOUNTER — Ambulatory Visit: Attending: Internal Medicine | Admitting: *Deleted

## 2024-09-10 DIAGNOSIS — I4821 Permanent atrial fibrillation: Secondary | ICD-10-CM | POA: Diagnosis not present

## 2024-09-10 DIAGNOSIS — Z5181 Encounter for therapeutic drug level monitoring: Secondary | ICD-10-CM

## 2024-09-10 LAB — POCT INR: INR: 2.3 (ref 2.0–3.0)

## 2024-09-10 NOTE — Progress Notes (Signed)
 Description   INR-2.3; Continue taking warfarin 1 tablet daily except 1.5 tablets on Monday, Wednesday, and Friday. Recheck INR in 5 weeks.  [facility]inic [phone]

## 2024-09-10 NOTE — Patient Instructions (Signed)
 Description   INR-2.3; Continue taking warfarin 1 tablet daily except 1.5 tablets on Monday, Wednesday, and Friday. Recheck INR in 5 weeks.  [facility] [phone]

## 2024-09-15 ENCOUNTER — Inpatient Hospital Stay: Admission: RE | Admit: 2024-09-15 | Source: Ambulatory Visit

## 2024-09-16 DIAGNOSIS — I4891 Unspecified atrial fibrillation: Secondary | ICD-10-CM

## 2024-09-16 NOTE — Telephone Encounter (Signed)
 Refill request for warfarin:  Last INR was 2.3 on 09/10/24 Next INR due 10/15/24 LOV was 12/25/23  Refill approved.

## 2024-09-30 ENCOUNTER — Other Ambulatory Visit: Payer: Self-pay | Admitting: Pharmacist

## 2024-09-30 MED ORDER — CLOPIDOGREL BISULFATE 75 MG PO TABS: 75.0000 mg | ORAL_TABLET | Freq: Every day | ORAL | 1 refills | Status: AC

## 2024-10-15 ENCOUNTER — Ambulatory Visit: Admitting: *Deleted

## 2024-10-15 DIAGNOSIS — I4821 Permanent atrial fibrillation: Secondary | ICD-10-CM

## 2024-10-15 DIAGNOSIS — Z5181 Encounter for therapeutic drug level monitoring: Secondary | ICD-10-CM

## 2024-10-15 LAB — POCT INR: INR: 1.8 — AB (ref 2.0–3.0)

## 2024-10-15 NOTE — Progress Notes (Signed)
 Description   INR-1.8; Today take 2 tablets of warfarin then continue taking warfarin 1 tablet daily except 1.5 tablets on Monday, Wednesday, and Friday. Recheck INR in 5 weeks.  [facility]inic [phone]

## 2024-10-15 NOTE — Patient Instructions (Addendum)
 Description   INR-1.8; Today take 2 tablets of warfarin then continue taking warfarin 1 tablet daily except 1.5 tablets on Monday, Wednesday, and Friday. Recheck INR in 5 weeks.  [facility]  Clinic [phone]

## 2024-10-23 ENCOUNTER — Ambulatory Visit: Admitting: Family Medicine

## 2024-10-27 ENCOUNTER — Other Ambulatory Visit

## 2024-11-19 ENCOUNTER — Ambulatory Visit
